# Patient Record
Sex: Female | Born: 1945 | Race: White | Hispanic: No | Smoking: Never smoker
Health system: Southern US, Community
[De-identification: ages and names within clinical notes are randomized; demographics above are authoritative.]

## PROBLEM LIST (undated history)

## (undated) DIAGNOSIS — I4819 Other persistent atrial fibrillation: Secondary | ICD-10-CM

## (undated) DIAGNOSIS — Z8781 Personal history of (healed) traumatic fracture: Secondary | ICD-10-CM

## (undated) DIAGNOSIS — G479 Sleep disorder, unspecified: Secondary | ICD-10-CM

## (undated) DIAGNOSIS — E0590 Thyrotoxicosis, unspecified without thyrotoxic crisis or storm: Secondary | ICD-10-CM

## (undated) DIAGNOSIS — R7301 Impaired fasting glucose: Secondary | ICD-10-CM

## (undated) DIAGNOSIS — I4510 Unspecified right bundle-branch block: Secondary | ICD-10-CM

## (undated) DIAGNOSIS — E042 Nontoxic multinodular goiter: Secondary | ICD-10-CM

## (undated) DIAGNOSIS — I10 Essential (primary) hypertension: Secondary | ICD-10-CM

## (undated) DIAGNOSIS — F419 Anxiety disorder, unspecified: Secondary | ICD-10-CM

## (undated) DIAGNOSIS — R7989 Other specified abnormal findings of blood chemistry: Secondary | ICD-10-CM

## (undated) DIAGNOSIS — H269 Unspecified cataract: Secondary | ICD-10-CM

## (undated) DIAGNOSIS — K5730 Diverticulosis of large intestine without perforation or abscess without bleeding: Secondary | ICD-10-CM

## (undated) DIAGNOSIS — F329 Major depressive disorder, single episode, unspecified: Secondary | ICD-10-CM

## (undated) DIAGNOSIS — S82209A Unspecified fracture of shaft of unspecified tibia, initial encounter for closed fracture: Secondary | ICD-10-CM

## (undated) DIAGNOSIS — K219 Gastro-esophageal reflux disease without esophagitis: Secondary | ICD-10-CM

## (undated) DIAGNOSIS — R3129 Other microscopic hematuria: Secondary | ICD-10-CM

## (undated) DIAGNOSIS — D689 Coagulation defect, unspecified: Secondary | ICD-10-CM

## (undated) DIAGNOSIS — H409 Unspecified glaucoma: Secondary | ICD-10-CM

## (undated) DIAGNOSIS — S83281A Other tear of lateral meniscus, current injury, right knee, initial encounter: Secondary | ICD-10-CM

## (undated) HISTORY — DX: Anxiety disorder, unspecified: F41.9

## (undated) HISTORY — DX: Coagulation defect, unspecified: D68.9

## (undated) HISTORY — DX: Unspecified right bundle-branch block: I45.10

## (undated) HISTORY — DX: Sleep disorder, unspecified: G47.9

## (undated) HISTORY — DX: Personal history of (healed) traumatic fracture: Z87.81

## (undated) HISTORY — DX: Other specified abnormal findings of blood chemistry: R79.89

## (undated) HISTORY — DX: Thyrotoxicosis, unspecified without thyrotoxic crisis or storm: E05.90

## (undated) HISTORY — DX: Other microscopic hematuria: R31.29

## (undated) HISTORY — DX: Unspecified cataract: H26.9

## (undated) HISTORY — DX: Impaired fasting glucose: R73.01

## (undated) HISTORY — DX: Nontoxic multinodular goiter: E04.2

## (undated) HISTORY — DX: Diverticulosis of large intestine without perforation or abscess without bleeding: K57.30

## (undated) HISTORY — DX: Other tear of lateral meniscus, current injury, right knee, initial encounter: S83.281A

## (undated) HISTORY — DX: Other persistent atrial fibrillation: I48.19

## (undated) HISTORY — DX: Unspecified fracture of shaft of unspecified tibia, initial encounter for closed fracture: S82.209A

## (undated) HISTORY — DX: Essential (primary) hypertension: I10

## (undated) HISTORY — DX: Major depressive disorder, single episode, unspecified: F32.9

## (undated) HISTORY — DX: Unspecified glaucoma: H40.9

## (undated) HISTORY — DX: Gastro-esophageal reflux disease without esophagitis: K21.9

---

## 1976-09-25 DIAGNOSIS — F32A Depression, unspecified: Secondary | ICD-10-CM

## 1976-09-25 HISTORY — DX: Depression, unspecified: F32.A

## 1986-09-25 HISTORY — PX: ABDOMINAL HYSTERECTOMY: SHX81

## 1986-09-25 HISTORY — PX: BREAST EXCISIONAL BIOPSY: SUR124

## 1990-09-25 HISTORY — PX: BREAST BIOPSY: SHX20

## 2000-02-24 ENCOUNTER — Encounter: Payer: Self-pay | Admitting: Internal Medicine

## 2001-05-15 ENCOUNTER — Ambulatory Visit (HOSPITAL_COMMUNITY): Admission: RE | Admit: 2001-05-15 | Discharge: 2001-05-15 | Payer: Self-pay | Admitting: Obstetrics and Gynecology

## 2001-05-15 ENCOUNTER — Encounter (INDEPENDENT_AMBULATORY_CARE_PROVIDER_SITE_OTHER): Payer: Self-pay

## 2003-03-23 ENCOUNTER — Encounter: Payer: Self-pay | Admitting: Internal Medicine

## 2004-10-06 ENCOUNTER — Ambulatory Visit: Payer: Self-pay | Admitting: Internal Medicine

## 2005-04-25 ENCOUNTER — Ambulatory Visit: Payer: Self-pay | Admitting: Internal Medicine

## 2005-04-25 DIAGNOSIS — K5730 Diverticulosis of large intestine without perforation or abscess without bleeding: Secondary | ICD-10-CM

## 2005-04-25 HISTORY — DX: Diverticulosis of large intestine without perforation or abscess without bleeding: K57.30

## 2005-05-09 ENCOUNTER — Ambulatory Visit: Payer: Self-pay | Admitting: Internal Medicine

## 2005-05-22 ENCOUNTER — Ambulatory Visit: Payer: Self-pay | Admitting: Internal Medicine

## 2005-07-12 ENCOUNTER — Ambulatory Visit: Payer: Self-pay | Admitting: Internal Medicine

## 2005-07-19 ENCOUNTER — Ambulatory Visit: Payer: Self-pay | Admitting: Internal Medicine

## 2005-11-22 ENCOUNTER — Ambulatory Visit: Payer: Self-pay | Admitting: *Deleted

## 2006-03-20 ENCOUNTER — Ambulatory Visit: Payer: Self-pay | Admitting: Urology

## 2006-03-20 ENCOUNTER — Encounter: Payer: Self-pay | Admitting: Internal Medicine

## 2006-03-22 ENCOUNTER — Ambulatory Visit: Payer: Self-pay | Admitting: Internal Medicine

## 2006-04-30 ENCOUNTER — Ambulatory Visit: Payer: Self-pay | Admitting: Internal Medicine

## 2006-07-23 ENCOUNTER — Ambulatory Visit: Payer: Self-pay | Admitting: Internal Medicine

## 2006-08-15 ENCOUNTER — Ambulatory Visit: Payer: Self-pay | Admitting: Internal Medicine

## 2006-09-04 ENCOUNTER — Ambulatory Visit: Payer: Self-pay | Admitting: *Deleted

## 2006-11-05 ENCOUNTER — Ambulatory Visit: Payer: Self-pay | Admitting: Internal Medicine

## 2006-12-19 ENCOUNTER — Encounter: Payer: Self-pay | Admitting: Internal Medicine

## 2006-12-19 DIAGNOSIS — I10 Essential (primary) hypertension: Secondary | ICD-10-CM | POA: Insufficient documentation

## 2006-12-19 DIAGNOSIS — G479 Sleep disorder, unspecified: Secondary | ICD-10-CM | POA: Insufficient documentation

## 2006-12-19 DIAGNOSIS — K219 Gastro-esophageal reflux disease without esophagitis: Secondary | ICD-10-CM | POA: Insufficient documentation

## 2006-12-19 DIAGNOSIS — K7689 Other specified diseases of liver: Secondary | ICD-10-CM

## 2006-12-19 DIAGNOSIS — J309 Allergic rhinitis, unspecified: Secondary | ICD-10-CM | POA: Insufficient documentation

## 2006-12-19 DIAGNOSIS — I4510 Unspecified right bundle-branch block: Secondary | ICD-10-CM | POA: Insufficient documentation

## 2006-12-19 DIAGNOSIS — F39 Unspecified mood [affective] disorder: Secondary | ICD-10-CM | POA: Insufficient documentation

## 2006-12-19 DIAGNOSIS — K5730 Diverticulosis of large intestine without perforation or abscess without bleeding: Secondary | ICD-10-CM | POA: Insufficient documentation

## 2007-05-23 ENCOUNTER — Encounter (INDEPENDENT_AMBULATORY_CARE_PROVIDER_SITE_OTHER): Payer: Self-pay | Admitting: *Deleted

## 2007-06-07 ENCOUNTER — Ambulatory Visit: Payer: Self-pay | Admitting: Internal Medicine

## 2007-06-10 LAB — CONVERTED CEMR LAB
ALT: 22 units/L (ref 0–35)
Albumin: 3.9 g/dL (ref 3.5–5.2)
Alkaline Phosphatase: 63 units/L (ref 39–117)
CO2: 31 meq/L (ref 19–32)
Calcium: 9.5 mg/dL (ref 8.4–10.5)
Chloride: 104 meq/L (ref 96–112)
GFR calc Af Amer: 93 mL/min
Glucose, Bld: 111 mg/dL — ABNORMAL HIGH (ref 70–99)
Potassium: 4.2 meq/L (ref 3.5–5.1)
Sodium: 141 meq/L (ref 135–145)
Total Bilirubin: 0.6 mg/dL (ref 0.3–1.2)
Total Protein: 7.6 g/dL (ref 6.0–8.3)

## 2007-06-19 ENCOUNTER — Ambulatory Visit: Payer: Self-pay | Admitting: Internal Medicine

## 2007-06-20 LAB — CONVERTED CEMR LAB
Free T4: 0.7 ng/dL (ref 0.6–1.6)
TSH: 0.12 microintl units/mL — ABNORMAL LOW (ref 0.35–5.50)

## 2007-08-01 ENCOUNTER — Ambulatory Visit: Payer: Self-pay | Admitting: Internal Medicine

## 2007-08-02 ENCOUNTER — Encounter: Payer: Self-pay | Admitting: Internal Medicine

## 2007-08-02 ENCOUNTER — Encounter (INDEPENDENT_AMBULATORY_CARE_PROVIDER_SITE_OTHER): Payer: Self-pay | Admitting: *Deleted

## 2007-09-25 ENCOUNTER — Encounter: Payer: Self-pay | Admitting: Internal Medicine

## 2007-11-13 ENCOUNTER — Telehealth (INDEPENDENT_AMBULATORY_CARE_PROVIDER_SITE_OTHER): Payer: Self-pay | Admitting: *Deleted

## 2007-12-09 ENCOUNTER — Ambulatory Visit: Payer: Self-pay | Admitting: Internal Medicine

## 2008-03-02 ENCOUNTER — Telehealth (INDEPENDENT_AMBULATORY_CARE_PROVIDER_SITE_OTHER): Payer: Self-pay | Admitting: *Deleted

## 2008-04-01 ENCOUNTER — Encounter: Payer: Self-pay | Admitting: Internal Medicine

## 2008-04-06 ENCOUNTER — Ambulatory Visit: Payer: Self-pay | Admitting: Internal Medicine

## 2008-04-07 ENCOUNTER — Telehealth (INDEPENDENT_AMBULATORY_CARE_PROVIDER_SITE_OTHER): Payer: Self-pay | Admitting: *Deleted

## 2008-04-09 ENCOUNTER — Encounter: Payer: Self-pay | Admitting: Internal Medicine

## 2008-05-04 ENCOUNTER — Ambulatory Visit: Payer: Self-pay | Admitting: Internal Medicine

## 2008-05-13 ENCOUNTER — Ambulatory Visit: Payer: Self-pay | Admitting: Internal Medicine

## 2008-05-15 LAB — CONVERTED CEMR LAB
AST: 24 units/L (ref 0–37)
Albumin: 3.5 g/dL (ref 3.5–5.2)
Alkaline Phosphatase: 66 units/L (ref 39–117)
Basophils Absolute: 0.1 10*3/uL (ref 0.0–0.1)
Bilirubin, Direct: 0.1 mg/dL (ref 0.0–0.3)
Chloride: 107 meq/L (ref 96–112)
Eosinophils Absolute: 0.2 10*3/uL (ref 0.0–0.7)
GFR calc Af Amer: 109 mL/min
GFR calc non Af Amer: 90 mL/min
HCT: 37.8 % (ref 36.0–46.0)
HDL: 51.9 mg/dL (ref >39.0)
MCV: 94.3 fL (ref 78.0–100.0)
Monocytes Absolute: 0.4 10*3/uL (ref 0.1–1.0)
Neutrophils Relative %: 46.9 % (ref 43.0–77.0)
Platelets: 313 10*3/uL (ref 150–400)
Potassium: 4.2 meq/L (ref 3.5–5.1)
RDW: 13.2 % (ref 11.5–14.6)
Sodium: 142 meq/L (ref 135–145)
TSH: 0.18 microintl units/mL — ABNORMAL LOW (ref 0.35–5.50)
Total Bilirubin: 0.5 mg/dL (ref 0.3–1.2)
Triglycerides: 46 mg/dL (ref 0–149)
VLDL: 9 mg/dL (ref 0–40)
WBC: 5.4 10*3/uL (ref 4.5–10.5)

## 2008-05-18 ENCOUNTER — Ambulatory Visit: Payer: Self-pay | Admitting: Internal Medicine

## 2008-05-20 LAB — CONVERTED CEMR LAB
Free T4: 0.7 ng/dL (ref 0.6–1.6)
TSH: 0.27 microintl units/mL — ABNORMAL LOW (ref 0.35–5.50)

## 2008-06-08 ENCOUNTER — Ambulatory Visit: Payer: Self-pay | Admitting: Internal Medicine

## 2008-06-17 ENCOUNTER — Ambulatory Visit: Payer: Self-pay | Admitting: Family Medicine

## 2008-06-19 ENCOUNTER — Telehealth: Payer: Self-pay | Admitting: Family Medicine

## 2008-07-24 ENCOUNTER — Telehealth: Payer: Self-pay | Admitting: Internal Medicine

## 2008-08-03 ENCOUNTER — Ambulatory Visit: Payer: Self-pay | Admitting: Internal Medicine

## 2008-08-03 ENCOUNTER — Encounter: Payer: Self-pay | Admitting: Internal Medicine

## 2008-08-05 ENCOUNTER — Encounter (INDEPENDENT_AMBULATORY_CARE_PROVIDER_SITE_OTHER): Payer: Self-pay | Admitting: *Deleted

## 2008-08-13 ENCOUNTER — Encounter: Payer: Self-pay | Admitting: Internal Medicine

## 2008-09-07 ENCOUNTER — Telehealth: Payer: Self-pay | Admitting: Internal Medicine

## 2008-12-10 ENCOUNTER — Ambulatory Visit: Payer: Self-pay | Admitting: Internal Medicine

## 2008-12-10 DIAGNOSIS — R7301 Impaired fasting glucose: Secondary | ICD-10-CM | POA: Insufficient documentation

## 2009-02-08 ENCOUNTER — Telehealth: Payer: Self-pay | Admitting: Internal Medicine

## 2009-02-16 ENCOUNTER — Ambulatory Visit: Payer: Self-pay | Admitting: Family Medicine

## 2009-02-17 ENCOUNTER — Ambulatory Visit: Payer: Self-pay | Admitting: Family Medicine

## 2009-03-10 ENCOUNTER — Encounter: Payer: Self-pay | Admitting: Internal Medicine

## 2009-03-10 ENCOUNTER — Ambulatory Visit: Payer: Self-pay | Admitting: Urology

## 2009-05-10 ENCOUNTER — Telehealth: Payer: Self-pay | Admitting: Internal Medicine

## 2009-05-25 ENCOUNTER — Telehealth: Payer: Self-pay | Admitting: Internal Medicine

## 2009-06-03 ENCOUNTER — Ambulatory Visit: Payer: Self-pay | Admitting: Family Medicine

## 2009-06-10 ENCOUNTER — Ambulatory Visit: Payer: Self-pay | Admitting: Family Medicine

## 2009-06-15 ENCOUNTER — Ambulatory Visit: Payer: Self-pay | Admitting: Internal Medicine

## 2009-06-17 LAB — CONVERTED CEMR LAB
ALT: 37 units/L — ABNORMAL HIGH (ref 0–35)
AST: 33 units/L (ref 0–37)
BUN: 25 mg/dL — ABNORMAL HIGH (ref 6–23)
Basophils Absolute: 0.1 10*3/uL (ref 0.0–0.1)
Basophils Relative: 1.1 % (ref 0.0–3.0)
CO2: 30 meq/L (ref 19–32)
Cholesterol: 208 mg/dL — ABNORMAL HIGH (ref 0–200)
Creatinine, Ser: 0.8 mg/dL (ref 0.4–1.2)
Free T4: 0.7 ng/dL (ref 0.6–1.6)
Glucose, Bld: 112 mg/dL — ABNORMAL HIGH (ref 70–99)
Lymphs Abs: 2.2 10*3/uL (ref 0.7–4.0)
MCV: 91.5 fL (ref 78.0–100.0)
Monocytes Relative: 8.9 % (ref 3.0–12.0)
Neutro Abs: 3.6 10*3/uL (ref 1.4–7.7)
TSH: 0.55 microintl units/mL (ref 0.35–5.50)
Total CHOL/HDL Ratio: 4
Total Protein: 8.1 g/dL (ref 6.0–8.3)
Triglycerides: 86 mg/dL (ref 0.0–149.0)
WBC: 6.7 10*3/uL (ref 4.5–10.5)

## 2009-07-15 ENCOUNTER — Encounter: Payer: Self-pay | Admitting: Internal Medicine

## 2009-08-05 ENCOUNTER — Encounter: Payer: Self-pay | Admitting: Internal Medicine

## 2009-08-05 ENCOUNTER — Ambulatory Visit: Payer: Self-pay | Admitting: Internal Medicine

## 2009-08-10 ENCOUNTER — Encounter: Payer: Self-pay | Admitting: Internal Medicine

## 2009-10-18 ENCOUNTER — Telehealth: Payer: Self-pay | Admitting: Internal Medicine

## 2009-10-19 ENCOUNTER — Telehealth: Payer: Self-pay | Admitting: Internal Medicine

## 2009-12-13 ENCOUNTER — Ambulatory Visit: Payer: Self-pay | Admitting: Internal Medicine

## 2010-03-03 ENCOUNTER — Telehealth: Payer: Self-pay | Admitting: Internal Medicine

## 2010-05-26 DIAGNOSIS — S82209A Unspecified fracture of shaft of unspecified tibia, initial encounter for closed fracture: Secondary | ICD-10-CM

## 2010-05-26 HISTORY — DX: Unspecified fracture of shaft of unspecified tibia, initial encounter for closed fracture: S82.209A

## 2010-05-31 ENCOUNTER — Encounter: Admission: RE | Admit: 2010-05-31 | Discharge: 2010-05-31 | Payer: Self-pay | Admitting: Internal Medicine

## 2010-06-27 ENCOUNTER — Telehealth: Payer: Self-pay | Admitting: Internal Medicine

## 2010-07-21 ENCOUNTER — Encounter: Payer: Self-pay | Admitting: Internal Medicine

## 2010-08-08 ENCOUNTER — Ambulatory Visit: Payer: Self-pay | Admitting: Internal Medicine

## 2010-08-11 LAB — CONVERTED CEMR LAB
Albumin: 4.3 g/dL (ref 3.5–5.2)
Alkaline Phosphatase: 65 units/L (ref 39–117)
BUN: 21 mg/dL (ref 6–23)
Basophils Absolute: 0 10*3/uL (ref 0.0–0.1)
Basophils Relative: 0.6 % (ref 0.0–3.0)
CO2: 28 meq/L (ref 19–32)
Calcium: 10.4 mg/dL (ref 8.4–10.5)
Chloride: 101 meq/L (ref 96–112)
Eosinophils Absolute: 0.1 10*3/uL (ref 0.0–0.7)
Free T4: 0.93 ng/dL (ref 0.60–1.60)
HCT: 39.9 % (ref 36.0–46.0)
Hemoglobin: 13.5 g/dL (ref 12.0–15.0)
Lymphs Abs: 2.1 10*3/uL (ref 0.7–4.0)
MCHC: 33.9 g/dL (ref 30.0–36.0)
MCV: 89.4 fL (ref 78.0–100.0)
Neutro Abs: 3.8 10*3/uL (ref 1.4–7.7)
Potassium: 4.7 meq/L (ref 3.5–5.1)
RBC: 4.46 M/uL (ref 3.87–5.11)
RDW: 15 % — ABNORMAL HIGH (ref 11.5–14.6)
TSH: 0.07 microintl units/mL — ABNORMAL LOW (ref 0.35–5.50)
Total Protein: 7.7 g/dL (ref 6.0–8.3)
Vitamin B-12: 846 pg/mL (ref 211–911)

## 2010-08-16 ENCOUNTER — Encounter: Payer: Self-pay | Admitting: Internal Medicine

## 2010-08-16 ENCOUNTER — Telehealth: Payer: Self-pay | Admitting: Internal Medicine

## 2010-08-16 ENCOUNTER — Ambulatory Visit: Payer: Self-pay | Admitting: Internal Medicine

## 2010-08-19 LAB — HM MAMMOGRAPHY: HM Mammogram: NORMAL

## 2010-08-22 ENCOUNTER — Encounter: Payer: Self-pay | Admitting: Internal Medicine

## 2010-10-27 NOTE — Letter (Signed)
Summary: Results Follow up Letter  [place] at [facility]  [address] [place], [place] [address]   Phone: [phone]  Fax: [phone]    08/22/2010 MRN: [id number]  [facility] [address] [place], [place]  [address]  Dear Ms. Gotts,  The following are the results of your recent test(s):  Test         Result    Pap Smear:        Normal _____  Not Normal _____ Comments: ______________________________________________________ Cholesterol: LDL(Bad cholesterol):         Your goal is less than:         HDL (Good cholesterol):       Your goal is more than: Comments:  ______________________________________________________ Mammogram:        Normal __X___  Not Normal _____ Comments:mammo looks fine, Repeat recommended in 1-2 years  ___________________________________________________________________ Hemoccult:        Normal _____  Not normal _______ Comments:    _____________________________________________________________________ Other Tests:    We routinely do not discuss normal results over the telephone.  If you desire a copy of the results, or you have any questions about this information we can discuss them at your next office visit.   Sincerely,      Tillman Abide, MD

## 2010-10-27 NOTE — Letter (Signed)
Summary: [facility]  [facility] Care-Nephrology   Imported By: Maryln Gottron 08/01/2010 14:20:25  _____________________________________________________________________  External Attachment:    Type:   Image     Comment:   External Document  Appended Document: [facility] Care-Nephrology no changes 1 year follow up as long as creatinine is stable Needs copy of labs (Dr Ellis Savage)

## 2010-10-27 NOTE — Progress Notes (Signed)
Summary: Question about labs  Phone Note Call from Patient Call back at Home Phone [phone]   Caller: Patient Call For: Alexandria Salt MD Summary of Call: Patient received copy of labs in the mail and wanted to know why Dr.Letvak didn't check her cholesterol? I asked pt was she on meds she said no, but pt still wanted to know why it wasn't checked. Please advise, pt is aware you are out of the office. Initial call taken by: Mervin Hack CMA Duncan Dull),  August 16, 2010 2:41 PM  Follow-up for Phone Call        Please let her know that we checked her cholesterol last year and it was pretty good with a total of 208. It is not necessary or appropriate to keep checkiing it. The [employer] Program recommends every 5 years for those who are not sig elevated or on treatment Follow-up by: Alexandria Salt MD,  August 17, 2010 8:36 AM  Additional Follow-up for Phone Call Additional follow up Details #1::        no answer at home number, no answering machine, will try again later. DeShannon Smith CMA Duncan Dull)  August 17, 2010 10:09 AM   no answer at home number, no answering machine, will try again later. DeShannon Smith CMA Duncan Dull)  August 17, 2010 12:27 PM   Spoke with patient and advised results. Pt understood Additional Follow-up by: Mervin Hack CMA Duncan Dull),  August 22, 2010 8:56 AM

## 2010-10-27 NOTE — Progress Notes (Signed)
Summary: Needs Prilosec name brand only  Phone Note Refill Request Call back at [phone] Message from:  Patient on October 19, 2009 3:11 PM  Refills Requested: Medication #1:  PRILOSEC OTC 20 MG TBEC Take 1 tablet by mouth twice a day Pt said she needs the name brand Prilosec. Generic Prilosec does not work for her.[facility] is going to fax a form to Dr. Alphonsus Sias to dispense name brand only for Prilosec. Pt has enough med to last for 3 days.  Pt knows Dr. Alphonsus Sias will be back in office on 10/20/09 in AM. Please advise.   Initial call taken by: Lewanda Rife LPN,  October 19, 2009 3:18 PM  Follow-up for Phone Call        Rx completed in Dr. Tiajuana Amass Follow-up by: Cindee Salt MD,  October 20, 2009 7:42 AM    New/Updated Medications: PRILOSEC 20 MG CPDR (OMEPRAZOLE) 1 daily for acid reflux [BMN] Prescriptions: PRILOSEC 20 MG CPDR (OMEPRAZOLE) 1 daily for acid reflux Brand medically necessary #30 x 12   Entered and Authorized by:   Cindee Salt MD   Signed by:   Cindee Salt MD on 10/20/2009   Method used:   Electronically to        [facility] (retail)       [address]       [place], [place]  [id number]       Ph: [phone]       Fax: [phone]   RxID:   [id number]

## 2010-10-27 NOTE — Assessment & Plan Note (Signed)
Summary: 6 MONTH FOLLOW UP/[facility]   Vital Signs:  Patient profile:   65 year old female Weight:      198 pounds BMI:     33.07 Temp:     99.00 degrees F oral Pulse rate:   106 / minute Pulse rhythm:   regular BP sitting:   110 / 80  (left arm) Cuff size:   large  Vitals Entered By: Mervin Hack CMA Duncan Dull) (August 08, 2010 10:44 AM) CC: 6 month follow-up   History of Present Illness: DOing okay reviewed recent nephrology visit--due for labs  Notes ongoing issue with energy levels May be related to hot weather Various complaints--memory issues, has some numbness --but seems to be in ulnar distribution. SOme tremor in those fingers and thumb as well Notes decreased strength in hands as well worried about spinal stenosis due to sister's problems with this  Fractured left tibia and injured knee due to twisting fall still has immobilizer on now--DR Charlann Boxer treating has been out of work for 2 months  Feels very depressed has to use walker and can't do much very preoccupied with leg but has trouble with word recall No apraxia or change in executive function  No chest pain No SOB Limited exercise now due to fractured tibia  Allergies: 1)  Phenergan (Promethazine Hcl) 2)  Keflex (Cephalexin) 3)  Codeine Phosphate (Codeine Phosphate) 4)  Augmentin  Past History:  Past medical, surgical, family and social histories (including risk factors) reviewed for relevance to current acute and chronic problems.  Past Medical History: Reviewed history from 06/15/2009 and no changes required. Depression 1978 Diverticulosis, colon 8/06 GERD Sleep distrurbance Glucose intolerance Hypertension Chronic low TSH but euthyroid Microscopic hematuria------------------------------------------Drs  Kiser/Cope  Past Surgical History: Hysterectomy/BSO (Endometriosis)  1988 Lumpectomy right breast 1988 Fx left tibia 9/11  Family History: Reviewed history from 04/06/2008 and no changes  required. Dad died young of accidental death Mom died of lung cancer, DM 2 sisters--1 has oral cancer CAD in family Depression in both sisters and mom Some dementia  Social History: Reviewed history from 06/07/2007 and no changes required. 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   Neck:  supple, no masses, no thyromegaly, no carotid bruits, and no cervical lymphadenopathy.   Lungs:  normal respiratory effort, no intercostal retractions, no accessory muscle use, and normal breath sounds.   Heart:  normal rate, regular rhythm, no murmur, and no gallop.   Abdomen:  soft and non-tender.   Neurologic:  alert & oriented X3 and strength normal in all extremities.  Walks with rolling walker Psych:  normally interactive, good eye contact, not anxious appearing, and dysphoric affect.     Impression & Recommendations:  Problem # 1:  FATIGUE (ICD-780.79) Assessment New  seems related to disability and worsening of her depression some memory loss that doesn't seem like dementia will check labs including B12  Orders: TLB-Renal Function Panel (80069-RENAL) TLB-CBC Platelet - w/Differential (85025-CBCD) TLB-Hepatic/Liver Function Pnl (80076-HEPATIC) TLB-TSH (Thyroid Stimulating Hormone) (84443-TSH) Venipuncture (46962) TLB-B12, Serum-Total ONLY (95284-X32)  Problem # 2:  DEPRESSION (ICD-311) Assessment: Deteriorated mild exacerbation with broken leg and being out of work hopes to go back to work in 4 weeks  Her updated medication list for this problem includes:    Lexapro 20 Mg Tabs (Escitalopram oxalate) .Marland Kitchen... Take 1 tablet daily    Clorazepate Dipotassium 7.5 Mg Tabs (Clorazepate  dipotassium) .Marland Kitchen... 1/2-1 three times a day as needed for nerves and sleep problems  Problem # 3:  HYPERTENSION (ICD-401.9) Assessment: Unchanged BP is fine due for labs  Her updated medication list for this problem includes:    Dyazide 37.5-25 Mg Caps (Triamterene-hctz) .Marland Kitchen... Take 1 by mouth once daily  BP today: 110/80 Prior BP: 122/80 (12/13/2009)  Labs Reviewed: K+: 4.5 (06/15/2009) Creat: : 0.8 (06/15/2009)   Chol: 208 (06/15/2009)   HDL: 57.90 (06/15/2009)   LDL: 114 (05/13/2008)   TG: 86.0 (06/15/2009)  Problem # 4:  GERD (ICD-530.81) Assessment: Unchanged okay on the meds  Her updated medication list for this problem includes:    Prilosec 20 Mg Cpdr (Omeprazole) .Marland Kitchen... 1 tab twice a day for acid reflux  Complete Medication List: 1)  Lexapro 20 Mg Tabs (Escitalopram oxalate) .... Take 1 tablet daily 2)  Clorazepate Dipotassium 7.5 Mg Tabs (Clorazepate dipotassium) .... 1/2-1 three times a day as needed for nerves and sleep problems 3)  Dyazide 37.5-25 Mg Caps (Triamterene-hctz) .... Take 1 by mouth once daily 4)  Antivert 25 Mg Tabs (Meclizine hcl) .... Take 1 by mouth once daily 5)  Mometasone Furoate 0.1 % Crea (Mometasone furoate) .... Apply two times a day as needed for itching 6)  Vitamin D 1000 Unit Tabs (Cholecalciferol) .Marland Kitchen.. 1 tab daily 7)  Prilosec 20 Mg Cpdr (Omeprazole) .Marland Kitchen.. 1 tab twice a day for acid reflux 8)  Bayer Womens 81-300 Mg Tabs (Aspirin-calcium carbonate) .... Take one by mouth once a day 9)  Multivitamins Tabs (Multiple vitamin) .... Otc as directed.  Other Orders: TLB-A1C / Hgb A1C (Glycohemoglobin) (83036-A1C) Zoster (Shingles) Vaccine Live [phone]) Admin 1st Vaccine (41324) Flu Vaccine 60yrs + MEDICARE PATIENTS (M0102) Administration Flu vaccine - MCR (V2536) Radiology Referral (Radiology)  Patient Instructions: 1)  Please schedule a follow-up appointment in 4 months .  2)  Schedule your mammogram.  Prescriptions: MOMETASONE FUROATE 0.1 %   CREA (MOMETASONE FUROATE) Apply two times a day as needed for itching  #45gm x 1   Entered by:   Mervin Hack CMA (AAMA)   Authorized by:   Cindee Salt MD   Signed by:   Mervin Hack CMA (AAMA) on 08/08/2010   Method used:   Electronically to        [facility] (retail)       [address]       [place], [place]  [id number]       Ph: [phone]       Fax: [phone]   RxID:   [id number] ANTIVERT 25 MG  TABS (MECLIZINE HCL) take 1 by mouth once daily  #90 x 3   Entered by:   Mervin Hack CMA (AAMA)   Authorized by:   Cindee Salt MD   Signed by:   Mervin Hack CMA (AAMA) on 08/08/2010   Method used:   Electronically to        [facility] (retail)       [address]       [place], [place]  [address]       Ph: [phone]       Fax: [phone]   RxID:  [id number]    Orders Added: 1)  TLB-Renal Function Panel [80069-RENAL] 2)  TLB-CBC Platelet - w/Differential [85025-CBCD] 3)  TLB-Hepatic/Liver Function Pnl [80076-HEPATIC] 4)  TLB-TSH (Thyroid Stimulating Hormone) [84443-TSH] 5)  Venipuncture [36415] 6)  TLB-B12, Serum-Total ONLY [82607-B12] 7)  Est. Patient Level IV [86578] 8)  TLB-A1C / Hgb A1C (Glycohemoglobin) [83036-A1C] 9)  Zoster (Shingles) Vaccine Live [90736] 10)  Admin 1st Vaccine [90471] 11)  Flu Vaccine 80yrs + MEDICARE PATIENTS [Q2039] 12)  Administration Flu vaccine - MCR [G0008] 13)  Radiology Referral [Radiology]   Immunizations Administered:  Zostavax # 1:    Vaccine Type: Zostavax    Site: left deltoid    Mfr: Merck    Dose: 0.79ml    Route: [place]    Given by: Mervin Hack CMA (AAMA)    Exp. Date: 05/13/2011    Lot #: [id number]    VIS given: 07/07/05 given August 08, 2010.   Immunizations Administered:  Zostavax # 1:    Vaccine Type: Zostavax    Site: left deltoid    Mfr: Merck    Dose: 0.20ml    Route: [place]    Given by:  Mervin Hack CMA (AAMA)    Exp. Date: 05/13/2011    Lot #: [id number]    VIS given: 07/07/05 given August 08, 2010.  Current Allergies (reviewed today): PHENERGAN (PROMETHAZINE HCL) KEFLEX (CEPHALEXIN) CODEINE PHOSPHATE (CODEINE PHOSPHATE) AUGMENTIN Flu Vaccine Consent Questions     Do you have a history of severe allergic reactions to this vaccine? no    Any prior history of allergic reactions to egg and/or gelatin? no    Do you have a sensitivity to the preservative Thimersol? no    Do you have a past history of Guillan-Barre Syndrome? no    Do you currently have an acute febrile illness? no    Have you ever had a severe reaction to latex? no    Vaccine information given and explained to patient? yes    Are you currently pregnant? no    Lot Number:[id number]   Exp Date:03/25/2011   Site Given  Left Deltoid IM    .[email or web address]

## 2010-10-27 NOTE — Progress Notes (Signed)
Summary: refill request for clorazepate  Phone Note Refill Request Message from:  Fax from Pharmacy  Refills Requested: Medication #1:  CLORAZEPATE DIPOTASSIUM 7.5 MG TABS 1/2-1 three times a day as needed for nerves and sleep problems   Last Refilled: 12/27/2009 Faxed request from [facility] is on your desk.  Initial call taken by: Lowella Petties CMA,  March 03, 2010 10:33 AM  Follow-up for Phone Call        Okay #90 x 1 Follow-up by: Cindee Salt MD,  March 03, 2010 12:54 PM  Additional Follow-up for Phone Call Additional follow up Details #1::        Rx faxed to pharmacy Additional Follow-up by: DeShannon Smith CMA Duncan Dull),  March 03, 2010 1:42 PM    Prescriptions: CLORAZEPATE DIPOTASSIUM 7.5 MG TABS (CLORAZEPATE DIPOTASSIUM) 1/2-1 three times a day as needed for nerves and sleep problems  #90 x 1   Entered by:   Mervin Hack CMA (AAMA)   Authorized by:   Cindee Salt MD   Signed by:   Mervin Hack CMA (AAMA) on 03/03/2010   Method used:   Handwritten   RxID:   [id number]

## 2010-10-27 NOTE — Progress Notes (Signed)
Summary: clorazepate-dipotass 7.5mg   Phone Note Refill Request Message from:  Fax from Pharmacy on October 18, 2009 2:21 PM  Refills Requested: Medication #1:  CLORAZEPATE DIPOTASSIUM 7.5 MG TABS 1/2-1 three times a day as needed for nerves and sleep problems   Supply Requested: 1 month   Last Refilled: 08/09/2009 [facility] [phone] or fax [phone]   Method Requested: Fax to Local Pharmacy Initial call taken by: Benny Lennert CMA Duncan Dull),  October 18, 2009 2:22 PM  Follow-up for Phone Call        okay #90 x 1 Follow-up by: Cindee Salt MD,  October 18, 2009 3:53 PM  Additional Follow-up for Phone Call Additional follow up Details #1::        Rx called to pharmacy Additional Follow-up by: DeShannon Katrinka Blazing CMA Duncan Dull),  October 18, 2009 3:57 PM    Prescriptions: CLORAZEPATE DIPOTASSIUM 7.5 MG TABS (CLORAZEPATE DIPOTASSIUM) 1/2-1 three times a day as needed for nerves and sleep problems  #90 x 1   Entered by:   Mervin Hack CMA (AAMA)   Authorized by:   Cindee Salt MD   Signed by:   Mervin Hack CMA (AAMA) on 10/18/2009   Method used:   Telephoned to ...       [facility] (retail)       [address]       [place], [place]  [address]       Ph: [phone]       Fax: [phone]   RxID:   [id number]

## 2010-10-27 NOTE — Assessment & Plan Note (Signed)
Summary: 5 M F/U DLO R/S FROM 12/16/09   Vital Signs:  Patient profile:   65 year old female Weight:      215 pounds Temp:     98.3 degrees F oral Pulse rate:   80 / minute Pulse rhythm:   regular BP sitting:   122 / 80  (left arm) Cuff size:   large  Vitals Entered By: Mervin Hack CMA Duncan Dull) (December 13, 2009 3:35 PM) CC: 6 month follow-up   History of Present Illness: Doing well "I have gotten lazy"---plans to work on this strength and energy down but feels it will improve   Has had some allergy symptoms using OTC eye drops benedryl seems to help more than loratadine  Prilosec is two times a day  controls her heartburn  Mood is good no problems on the lexapro  No chest pain No SOB  Allergies: 1)  Phenergan (Promethazine Hcl) 2)  Keflex (Cephalexin) 3)  Codeine Phosphate (Codeine Phosphate) 4)  Augmentin  Past History:  Past medical, surgical, family and social histories (including risk factors) reviewed for relevance to current acute and chronic problems.  Past Medical History: Reviewed history from 06/15/2009 and no changes required. Depression 1978 Diverticulosis, colon 8/06 GERD Sleep distrurbance Glucose intolerance Hypertension Chronic low TSH but euthyroid Microscopic hematuria------------------------------------------Drs  Kiser/Cope  Past Surgical History: Reviewed history from 12/09/2007 and no changes required. Hysterectomy/BSO (Endometriosis)  1988 Lumpectomy right breast 1988  Family History: Reviewed history from 04/06/2008 and no changes required. Dad died young of accidental death Mom died of lung cancer, DM 2 sisters--1 has oral cancer CAD in family Depression in both sisters and mom Some dementia  Social History: Reviewed history from 06/07/2007 and no changes required. 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 Not sure if she will be continuing next year  Physical Exam  General:  alert and normal appearance.   Neck:  supple, no masses, no thyromegaly, no carotid bruits, and no cervical lymphadenopathy.   Lungs:  normal respiratory effort and normal breath sounds.   Heart:  normal rate, regular rhythm, no murmur, and no gallop.   Abdomen:  soft and non-tender.   Msk:  no joint tenderness and no joint swelling.   Extremities:  no edema Psych:  normally interactive, good eye contact, not anxious appearing, and not depressed appearing.     Impression & Recommendations:  Problem # 1:  GERD (ICD-530.81) Assessment Unchanged doing well on two times a day prilosec need brand med  Her updated medication list for this problem includes:    Prilosec 20 Mg Cpdr (Omeprazole) .Marland Kitchen... 1 tab twice a day for acid reflux  Problem # 2:  DEPRESSION (ICD-311) Assessment: Unchanged mood is good on meds no wean planned due to chronicity  Her updated medication list for this problem includes:    Clorazepate Dipotassium 7.5 Mg Tabs (Clorazepate dipotassium) .Marland Kitchen... 1/2-1 three times a day as needed for nerves and sleep problems    Lexapro 20 Mg Tabs (Escitalopram oxalate) .Marland Kitchen... Take 1 tablet daily  Problem # 3:  HYPERTENSION, BORDERLINE (ICD-401.9) Assessment: Unchanged uses the med for occ edema at this point  Her updated medication list for this problem includes:    Dyazide 37.5-25 Mg  Caps (Triamterene-hctz) .Marland Kitchen... As needed  BP today: 122/80 Prior BP: 120/78 (06/15/2009)  Labs Reviewed: K+: 4.5 (06/15/2009) Creat: : 0.8 (06/15/2009)   Chol: 208 (06/15/2009)   HDL: 57.90 (06/15/2009)   LDL: 114 (05/13/2008)   TG: 86.0 (06/15/2009)  Problem # 4:  ALLERGIC RHINITIS (ICD-477.9) Assessment: Comment Only using OTC med for eyes now  also  Her updated medication list for this problem includes:    Loratadine 10 Mg Tabs (Loratadine) .Marland Kitchen... 1-2 tabs daily as needed for allergy symptoms  Complete Medication List: 1)  Clorazepate Dipotassium 7.5 Mg Tabs (Clorazepate dipotassium) .... 1/2-1 three times a day as needed for nerves and sleep problems 2)  Bayer Womens 81-300 Mg Tabs (Aspirin-calcium carbonate) .... Take one by mouth once a day 3)  Dyazide 37.5-25 Mg Caps (Triamterene-hctz) .... As needed 4)  Antivert 25 Mg Tabs (Meclizine hcl) .... As needed 5)  Mometasone Furoate 0.1 % Crea (Mometasone furoate) .... Apply two times a day as needed for itching 6)  Lexapro 20 Mg Tabs (Escitalopram oxalate) .... Take 1 tablet daily 7)  Multivitamins Tabs (Multiple vitamin) .... Otc as directed. 8)  Loratadine 10 Mg Tabs (Loratadine) .Marland Kitchen.. 1-2 tabs daily as needed for allergy symptoms 9)  Vitamin D 1000 Unit Tabs (Cholecalciferol) .Marland Kitchen.. 1 tab daily 10)  Prilosec 20 Mg Cpdr (Omeprazole) .Marland Kitchen.. 1 tab twice a day for acid reflux  Other Orders: Pneumococcal Vaccine (81191) Admin 1st Vaccine (47829) Admin 1st Vaccine [facility]) [phone])  Patient Instructions: 1)  Please schedule a follow-up appointment in 6 months .   Current Allergies (reviewed today): PHENERGAN (PROMETHAZINE HCL) KEFLEX (CEPHALEXIN) CODEINE PHOSPHATE (CODEINE PHOSPHATE) AUGMENTIN   Pneumovax Vaccine    Vaccine Type: Pneumovax    Site: right deltoid    Mfr: Merck    Dose: 0.5 ml    Route: IM    Given by: Mervin Hack CMA (AAMA)    Exp. Date: 05/09/2011    Lot #: [id number]    VIS given: 04/22/96 version given December 13, 2009.

## 2010-10-27 NOTE — Progress Notes (Signed)
Summary: CLORAZEPATE DIPOTASSIUM 7.5 MG TABS  Phone Note Refill Request Message from:  Fax from Pharmacy on June 27, 2010 10:28 AM  Refills Requested: Medication #1:  CLORAZEPATE DIPOTASSIUM 7.5 MG TABS 1/2-1 three times a day as needed for nerves and sleep problems   Last Refilled: 04/04/2010 Refill request from [facility]. [phone]. Form is on your desk.   Initial call taken by: Melody Comas,  June 27, 2010 10:33 AM  Follow-up for Phone Call        okay #90 x 1 Follow-up by: Cindee Salt MD,  June 27, 2010 1:31 PM  Additional Follow-up for Phone Call Additional follow up Details #1::        Rx faxed to pharmacy Additional Follow-up by: DeShannon Smith CMA Duncan Dull),  June 27, 2010 2:25 PM    Prescriptions: CLORAZEPATE DIPOTASSIUM 7.5 MG TABS (CLORAZEPATE DIPOTASSIUM) 1/2-1 three times a day as needed for nerves and sleep problems  #90 x 1   Entered by:   Mervin Hack CMA (AAMA)   Authorized by:   Cindee Salt MD   Signed by:   Mervin Hack CMA (AAMA) on 06/27/2010   Method used:   Handwritten   RxID:   [id number]

## 2010-11-14 ENCOUNTER — Telehealth: Payer: Self-pay | Admitting: Internal Medicine

## 2010-11-22 NOTE — Progress Notes (Signed)
Summary: CLORAZEPATE DIPOTASSIUM  Phone Note Refill Request Message from:  [facility] [phone] on November 14, 2010 12:46 PM  Refills Requested: Medication #1:  CLORAZEPATE DIPOTASSIUM 7.5 MG TABS 1/2-1 three times a day as needed for nerves and sleep problems   Last Refilled: 09/01/2010 Form on your desk    Method Requested: Fax to Local Pharmacy Initial call taken by: DeShannon Smith CMA Duncan Dull),  November 14, 2010 12:46 PM  Follow-up for Phone Call        okay #90 x 0 Follow-up by: Cindee Salt MD,  November 14, 2010 1:33 PM  Additional Follow-up for Phone Call Additional follow up Details #1::        Rx faxed to pharmacy Additional Follow-up by: DeShannon Katrinka Blazing CMA Duncan Dull),  November 14, 2010 2:24 PM    Prescriptions: CLORAZEPATE DIPOTASSIUM 7.5 MG TABS (CLORAZEPATE DIPOTASSIUM) 1/2-1 three times a day as needed for nerves and sleep problems  #90 x 0   Entered by:   Mervin Hack CMA (AAMA)   Authorized by:   Cindee Salt MD   Signed by:   Mervin Hack CMA (AAMA) on 11/14/2010   Method used:   Handwritten   RxID:   [id number]

## 2010-11-24 ENCOUNTER — Ambulatory Visit (INDEPENDENT_AMBULATORY_CARE_PROVIDER_SITE_OTHER): Payer: Medicare Other | Admitting: Internal Medicine

## 2010-11-24 ENCOUNTER — Encounter: Payer: Self-pay | Admitting: Internal Medicine

## 2010-11-24 DIAGNOSIS — F329 Major depressive disorder, single episode, unspecified: Secondary | ICD-10-CM

## 2010-12-01 NOTE — Assessment & Plan Note (Signed)
Summary: 4 MTH F/U DLO   Vital Signs:  Patient profile:   65 year old female Weight:      209 pounds Temp:     98.5 degrees F oral Pulse rate:   87 / minute Pulse rhythm:   regular BP sitting:   124 / 90  (left arm) Cuff size:   regular  Vitals Entered By: Mervin Hack CMA Duncan Dull) (November 24, 2010 3:46 PM) CC: follow-up   History of Present Illness: Notes that her fatigue is somewhat better has gone back to work now The heat had really affected her  Left knee is still not well wearing the brace regularly--this "wears me out"  Depression is better Outlook is better Concerned about possible surgery for knee still  Allergies: 1)  Phenergan (Promethazine Hcl) 2)  Keflex (Cephalexin) 3)  Codeine Phosphate (Codeine Phosphate) 4)  Augmentin  Past History:  Past Medical History: Depression 1978 Diverticulosis, colon 8/06 GERD Sleep distrurbance Impaired fasting glucose Hypertension Chronic low TSH but euthyroid Microscopic hematuria------------------------------------------Drs  Kiser/Cope  Review of Systems       worries about her age and weight weight is up 11# In bed by 8 or after----yp at 4AM though. Uses tranxene regularly   Physical Exam  General:  alert and normal appearance.   Psych:  normally interactive, good eye contact, not anxious appearing, and not depressed appearing.     Impression & Recommendations:  Problem # 1:  DEPRESSION (ICD-311) Assessment Improved Mood is better  back at work Bothered by uncertainty about knee still discussed pool exercises---may give her some greater sense of wellness  Her updated medication list for this problem includes:    Lexapro 20 Mg Tabs (Escitalopram oxalate) .Marland Kitchen... Take 1 tablet daily    Clorazepate Dipotassium 7.5 Mg Tabs (Clorazepate dipotassium) .Marland Kitchen... 1/2-1 three times a day as needed for nerves and sleep problems  Complete Medication List: 1)  Lexapro 20 Mg Tabs (Escitalopram oxalate) .... Take  1 tablet daily 2)  Clorazepate Dipotassium 7.5 Mg Tabs (Clorazepate dipotassium) .... 1/2-1 three times a day as needed for nerves and sleep problems 3)  Dyazide 37.5-25 Mg Caps (Triamterene-hctz) .... Take 1 by mouth once daily 4)  Antivert 25 Mg Tabs (Meclizine hcl) .... Take 1 by mouth once daily 5)  Mometasone Furoate 0.1 % Crea (Mometasone furoate) .... Apply two times a day as needed for itching 6)  Vitamin D 1000 Unit Tabs (Cholecalciferol) .Marland Kitchen.. 1 tab daily 7)  Prilosec 20 Mg Cpdr (Omeprazole) .Marland Kitchen.. 1 tab twice a day for acid reflux 8)  Bayer Womens 81-300 Mg Tabs (Aspirin-calcium carbonate) .... Take one by mouth once a day 9)  Multivitamins Tabs (Multiple vitamin) .... Otc as directed. 10)  Calcium 500 Mg Tabs (Calcium) .... Take 1 by mouth two times a day  Patient Instructions: 1)  Please schedule a follow-up appointment in 5 months for Medicare Wellness visit   Orders Added: 1)  Est. Patient Level III [95621]    Current Allergies (reviewed today): PHENERGAN (PROMETHAZINE HCL) KEFLEX (CEPHALEXIN) CODEINE PHOSPHATE (CODEINE PHOSPHATE) AUGMENTIN

## 2011-02-03 ENCOUNTER — Other Ambulatory Visit: Payer: Self-pay | Admitting: *Deleted

## 2011-02-03 MED ORDER — CLORAZEPATE DIPOTASSIUM 7.5 MG PO TABS: ORAL_TABLET | ORAL | Status: DC

## 2011-02-03 NOTE — Telephone Encounter (Signed)
rx called into pharmacy

## 2011-02-03 NOTE — Telephone Encounter (Signed)
Okay #90 x 0 

## 2011-02-07 NOTE — Assessment & Plan Note (Signed)
[facility]                                 ON-CALL NOTE   NAME:Smotherman, JACOBI RYANT                      MRN:          [id number]  DATE:05/30/2008                            DOB:          October 26, 1945    DATE OF INTERACTION:  May 30, 2008 at 12:44 p.m.   PHONE NUMBER:  [phone].   OBJECTIVE:  The patient had yellow jacket stings yesterday, 3.  They are  swollen and red.  Now she is being driven nuts by itching.  Spoke to the  [place] nurses if she could get steroids.  I would rather not do that.  She  has been using Benadryl, told her to get Claritin, Zyrtec, or Allegra  and take one of those and then augment with Benadryl.  She knows that  she should also ice it 20 minutes at a time every hour and she has  mometasone cream at home, steroid cream.  She may apply that twice a  day.   PRIMARY CARE Kei Mcelhiney:  Karie Schwalbe, MD, Office at [facility].     Arta Silence, MD  Electronically Signed    RNS/MedQ  DD: 05/30/2008  DT: 05/31/2008  Job #: [id number]

## 2011-02-10 NOTE — Op Note (Signed)
[facility]  Patient:    Alexandria Sherman, Alexandria Sherman Visit Number: [id number] MRN: [id number]          Service Type: DSU Location: [facility] Attending Physician:  Morene Antu Proc. Date: 05/15/01 Adm. Date:  05/15/2001                             Operative Report  PREOPERATIVE DIAGNOSIS:       Vulvar nodule.  POSTOPERATIVE DIAGNOSIS:      Vulvar nodule.  OPERATION:                    Wide local excision of vulvar nodule.  SURGEON:                      Sherry A. Rosalio Macadamia, M.D.  ANESTHESIA:                   MAC.  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  The patient has had a skin tag present for many years in the vulvar area which recently has grown.  The patient was seen in the office with the diagnosis of presumed condyloma.  She was treated with ______ x 2, initially 50%, the 80%, with no change in the size of the condyloma.  Because of this, the patient is brought to the operating room for wide excision.  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  DESCRIPTION OF PROCEDURE:     The patient is brought into the operating room and given adequate IV sedation.  She was placed in the dorsolithotomy position.  The perineum was washed with Betadine.  The abnormal area was outlined with a marking pen.  The area was infiltrated with 1% Xylocaine with epinephrine and bicarb.  The area was excised to attempt to leave a wide enough margin if this was significantly abnormal.  This was excised circumferentially with some deep tissue removed as well.  Because the upper portion of the nodular area was so close to the anus, very wide excision in this area was not possible.  The deep tissues were excised, and specimen was removed.  Small bleeding areas were closed with 2-0 Vicryl in deep interrupted stitches.  Adequate  hemostasis was obtained with approximation of the skin edges.  Skin was then closed with 4-0 chromic in a subcuticular running stitch.  Adequate hemostasis was present.  The patient was taken out of the dorsolithotomy position.  She was awakened, and she was moved from the operating table to a stretcher in stable condition.  Complications were none. Estimated blood loss was less than 5 cc. Attending Physician:  Morene Antu DD:  05/15/01 TD:  05/16/01 Job: [id number]

## 2011-02-24 HISTORY — PX: KNEE ARTHROSCOPY: SUR90

## 2011-02-27 ENCOUNTER — Telehealth: Payer: Self-pay | Admitting: Internal Medicine

## 2011-02-27 NOTE — Telephone Encounter (Signed)
Patient needed to know what anesthesia she got in 2006 for her colon.  She was given Fentanyl 100 mcg and Versed 10 mg.  She is having a procedure next week and needed to know.

## 2011-03-31 ENCOUNTER — Other Ambulatory Visit: Payer: Self-pay | Admitting: *Deleted

## 2011-03-31 MED ORDER — CLORAZEPATE DIPOTASSIUM 7.5 MG PO TABS: 3.7500 mg | ORAL_TABLET | Freq: Three times a day (TID) | ORAL | Status: DC | PRN

## 2011-03-31 NOTE — Telephone Encounter (Signed)
rx faxed to pharmacy manually  

## 2011-03-31 NOTE — Telephone Encounter (Signed)
Okay #90 x 0 

## 2011-03-31 NOTE — Telephone Encounter (Signed)
Form on your desk  

## 2011-04-13 ENCOUNTER — Ambulatory Visit: Payer: Self-pay | Admitting: Urology

## 2011-05-01 ENCOUNTER — Encounter: Payer: Self-pay | Admitting: Internal Medicine

## 2011-05-02 ENCOUNTER — Encounter: Payer: Self-pay | Admitting: Internal Medicine

## 2011-05-02 ENCOUNTER — Ambulatory Visit (INDEPENDENT_AMBULATORY_CARE_PROVIDER_SITE_OTHER): Payer: Medicare Other | Admitting: Internal Medicine

## 2011-05-02 ENCOUNTER — Encounter: Payer: BC Managed Care – PPO | Admitting: Internal Medicine

## 2011-05-02 ENCOUNTER — Ambulatory Visit: Payer: BC Managed Care – PPO | Admitting: Internal Medicine

## 2011-05-02 DIAGNOSIS — R7301 Impaired fasting glucose: Secondary | ICD-10-CM

## 2011-05-02 DIAGNOSIS — R945 Abnormal results of liver function studies: Secondary | ICD-10-CM

## 2011-05-02 DIAGNOSIS — Z13220 Encounter for screening for lipoid disorders: Secondary | ICD-10-CM

## 2011-05-02 DIAGNOSIS — R5383 Other fatigue: Secondary | ICD-10-CM

## 2011-05-02 DIAGNOSIS — F329 Major depressive disorder, single episode, unspecified: Secondary | ICD-10-CM

## 2011-05-02 DIAGNOSIS — K769 Liver disease, unspecified: Secondary | ICD-10-CM

## 2011-05-02 DIAGNOSIS — R946 Abnormal results of thyroid function studies: Secondary | ICD-10-CM | POA: Insufficient documentation

## 2011-05-02 LAB — BASIC METABOLIC PANEL
BUN: 23 mg/dL (ref 6–23)
CO2: 30 mEq/L (ref 19–32)
Calcium: 9.8 mg/dL (ref 8.4–10.5)
Chloride: 102 mEq/L (ref 96–112)
Creatinine, Ser: 0.8 mg/dL (ref 0.4–1.2)

## 2011-05-02 LAB — TSH: TSH: 0.12 u[IU]/mL — ABNORMAL LOW (ref 0.35–5.50)

## 2011-05-02 LAB — CBC WITH DIFFERENTIAL/PLATELET
Basophils Absolute: 0 10*3/uL (ref 0.0–0.1)
Eosinophils Absolute: 0.2 10*3/uL (ref 0.0–0.7)
Lymphocytes Relative: 33.5 % (ref 12.0–46.0)
MCHC: 32.9 g/dL (ref 30.0–36.0)
MCV: 89.8 fl (ref 78.0–100.0)
Monocytes Absolute: 0.5 10*3/uL (ref 0.1–1.0)
Neutrophils Relative %: 54.8 % (ref 43.0–77.0)
Platelets: 307 10*3/uL (ref 150.0–400.0)
RDW: 14.3 % (ref 11.5–14.6)

## 2011-05-02 LAB — HEPATIC FUNCTION PANEL
ALT: 24 U/L (ref 0–35)
Bilirubin, Direct: 0.1 mg/dL (ref 0.0–0.3)
Total Bilirubin: 0.7 mg/dL (ref 0.3–1.2)

## 2011-05-02 LAB — LIPID PANEL
HDL: 66.2 mg/dL (ref >39.00)
Total CHOL/HDL Ratio: 3
Triglycerides: 77 mg/dL (ref 0.0–149.0)
VLDL: 15.4 mg/dL (ref 0.0–40.0)

## 2011-05-02 LAB — T4, FREE: Free T4: 0.84 ng/dL (ref 0.60–1.60)

## 2011-05-02 NOTE — Progress Notes (Signed)
Subjective:    Patient ID: Alexandria Sherman, female    DOB: 1946-04-02, 65 y.o.   MRN: [id number]  HPI Has Wellness visit next week but is concerned about several issues first Is concerned about lack of energy, stamina and strength Worried about her thyroid Wants Lyme disease checked  Feels her depression is worse Not sure if this is related to lack of energy Reviewed that she does have intolerance to heat, excessive sweating as well  Still with knee pain Had left knee arthroscopy in June but still limps (Dr Charlann Boxer)  No chest pain No SOB  Current Outpatient Prescriptions on File Prior to Visit  Medication Sig Dispense Refill  . Aspirin-Calcium Carbonate (BAYER WOMENS) 81-300 MG TABS Take by mouth daily.        . Calcium Carbonate (CALCIUM 500 PO) Take one by mouth twice a day       . cholecalciferol (VITAMIN D) 1000 UNITS tablet Take 1,000 Units by mouth daily.        . clorazepate (TRANXENE) 7.5 MG tablet Take 0.5-1 tablets (3.75-7.5 mg total) by mouth 3 (three) times daily as needed.  90 tablet  0  . escitalopram (LEXAPRO) 20 MG tablet Take 20 mg by mouth daily.        . meclizine (ANTIVERT) 25 MG tablet Take 25 mg by mouth daily.        . mometasone (ELOCON) 0.1 % cream Apply 2 times a day as needed for itching       . Multiple Vitamin (MULTIVITAMIN) tablet Take over the counter as directed       . omeprazole (PRILOSEC) 20 MG capsule Take one tablet twice a day for acid reflux       . triamterene-hydrochlorothiazide (DYAZIDE) 37.5-25 MG per capsule Take 1 capsule by mouth every morning.          Allergies  Allergen Reactions  . Cephalexin     REACTION: blisters  . Codeine Phosphate     REACTION: vomiting  . BJY:NWGNFAOZHYQ+MVHQIONGE+XBMWUXLKGM Acid+Aspartame     REACTION: unspecified  . Promethazine Hcl     REACTION: nerves    Past Medical History  Diagnosis Date  . Depression 1978  . Diverticulosis of colon 04/2005  . GERD (gastroesophageal reflux disease)   . Sleep  disturbance   . Impaired fasting glucose   . Hypertension   . Low TSH level     chronic low, but euthyroid  . Microscopic hematuria   . Tibia fracture 05/2010    left    Past Surgical History  Procedure Date  . Breast lumpectomy 1988    right  . Abdominal hysterectomy 1988    hysterectomy/ BSO for endometriosis    Family History  Problem Relation Age of Onset  . Cancer Mother     lung  . Diabetes Mother   . Depression Mother   . Cancer Sister     oral cancer  . Depression Sister   . Depression Sister     History   Social History  . Marital Status: Divorced    Spouse Name: N/A    Number of Children: 0  . Years of Education: N/A   Occupational History  . 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 Smoking status: Never Smoker   . Smokeless tobacco: Never Used  . Alcohol Use: Yes     Occasional  . Drug Use: Not  on file  . Sexually Active: Not on file   Other Topics Concern  . Not on file   Social History Narrative   [name] is her cocker spanielTries to walk regularly.   Review of Systems Generally sleeps okay--though troubled for the past couple of nights Stomach has been fine Had recent CT of abdomen to recheck renal cysts which are stable (COpe). Liver abnormality has resolved. Internal hernia found    Objective:   Physical Exam  Constitutional: She appears well-developed and well-nourished. No distress.  Neck: Normal range of motion. Neck supple. No thyromegaly present.  Cardiovascular: Normal rate, regular rhythm, normal heart sounds and intact distal pulses.  Exam reveals no gallop.   No murmur heard. Pulmonary/Chest: Effort normal and breath sounds normal. No respiratory distress. She has no wheezes. She has no rales.  Abdominal: Soft. There is no tenderness.  Musculoskeletal: Normal range of motion. She exhibits no edema and no tenderness.       No synovitis    Lymphadenopathy:    She has no cervical adenopathy.    She has no axillary adenopathy.       Right: No inguinal adenopathy present.       Left: No inguinal adenopathy present.  Skin: No rash noted.  Psychiatric: She has a normal mood and affect. Her behavior is normal. Judgment and thought content normal.          Assessment & Plan:

## 2011-05-02 NOTE — Assessment & Plan Note (Signed)
This sounds similar to complaints from last summer as well Will recheck labs ??subclinical hyperthyroidism Probably related to depression if labs okay

## 2011-05-02 NOTE — Assessment & Plan Note (Signed)
Low TSH last time No nodules or gland enlargement Will check labs If TSH still low, will refer to endocrine

## 2011-05-02 NOTE — Assessment & Plan Note (Signed)
Consider adding bupropion to regimen if no other answer for the fatigue

## 2011-05-03 LAB — HEMOGLOBIN A1C: Hgb A1c MFr Bld: 6 % (ref 4.6–6.5)

## 2011-05-05 LAB — T3, FREE: T3, Free: 3.6 pg/mL (ref 2.3–4.2)

## 2011-05-09 ENCOUNTER — Encounter: Payer: Self-pay | Admitting: Internal Medicine

## 2011-05-09 ENCOUNTER — Encounter: Payer: Self-pay | Admitting: *Deleted

## 2011-05-09 ENCOUNTER — Ambulatory Visit (INDEPENDENT_AMBULATORY_CARE_PROVIDER_SITE_OTHER): Payer: Medicare Other | Admitting: Internal Medicine

## 2011-05-09 DIAGNOSIS — Z0000 Encounter for general adult medical examination without abnormal findings: Secondary | ICD-10-CM

## 2011-05-09 DIAGNOSIS — F329 Major depressive disorder, single episode, unspecified: Secondary | ICD-10-CM

## 2011-05-09 DIAGNOSIS — R946 Abnormal results of thyroid function studies: Secondary | ICD-10-CM

## 2011-05-09 MED ORDER — BUPROPION HCL ER (XL) 150 MG PO TB24: 150.0000 mg | ORAL_TABLET | Freq: Every day | ORAL | Status: DC

## 2011-05-09 NOTE — Patient Instructions (Signed)
Please set up the bone density test----requests [employer]

## 2011-05-09 NOTE — Assessment & Plan Note (Signed)
Ongoing symptoms despite the lexapro Probably the reason for the fatigue We will try augmentation with bupropion

## 2011-05-09 NOTE — Progress Notes (Signed)
Subjective:    Patient ID: Alexandria Sherman, female    DOB: [birth year], 65 y.o.   MRN: [id number]  HPI Here for Wellness exam Reviewed labs with her Only question is the low TSH but free T4 and T3 are normal  Reviewed her Wellness form Had 1 fall with fractured tibia and then arthroscopy on knee Rubber sole on shoe stuck on carpet and leg was twisted Has had other falls Notes that she often has problems when she is carrying things. 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 Aspirin-Calcium Carbonate (BAYER WOMENS) 81-300 MG TABS Take by mouth daily.        . Calcium Carbonate (CALCIUM 500 PO) Take one by mouth twice a day       . cholecalciferol (VITAMIN D) 1000 UNITS tablet Take 1,000 Units by mouth daily.        . clorazepate (TRANXENE) 7.5 MG tablet Take 0.5-1 tablets (3.75-7.5 mg total) by mouth 3 (three) times daily as needed.  90 tablet  0  . escitalopram (LEXAPRO) 20 MG tablet Take 20 mg by mouth daily.        . meclizine (ANTIVERT) 25 MG tablet Take 25 mg by mouth daily.        . mometasone (ELOCON) 0.1 % cream Apply 2 times a day as needed for itching       . Multiple Vitamin (MULTIVITAMIN) tablet Take over the counter as directed       . omeprazole (PRILOSEC) 20 MG capsule Take one tablet twice a day for acid reflux       . triamterene-hydrochlorothiazide (DYAZIDE) 37.5-25 MG per capsule Take 1 capsule by mouth every morning.          Allergies  Allergen Reactions  . Cephalexin     REACTION: blisters  . Codeine Phosphate     REACTION: vomiting  . FAO:ZHYQMVHQION+GEXBMWUXL+KGMWNUUVOZ Acid+Aspartame     REACTION:  unspecified  . Promethazine Hcl     REACTION: nerves    Past Medical History  Diagnosis Date  . Depression 1978  . Diverticulosis of colon 04/2005  . GERD (gastroesophageal reflux disease)   . Sleep disturbance   . Impaired fasting glucose   . Hypertension   . Low TSH level     chronic low, but euthyroid  . Microscopic hematuria   . Tibia fracture 05/2010    left    Past Surgical History  Procedure Date  . Breast lumpectomy 1988    right  . Abdominal hysterectomy 1988    hysterectomy/ BSO for endometriosis  . Knee arthroscopy 6/12    left--Dr Charlann Boxer    Family History  Problem Relation Age of Onset  . Cancer Mother     lung  . Diabetes Mother   . Depression Mother   . Cancer Sister     oral cancer  . Depression Sister   . Depression Sister     History   Social History  . Marital Status: Divorced    Spouse Name: N/A    Number of Children: 0  . Years of Education: N/A   Occupational History  . 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 Smoking status: Never Smoker   . Smokeless tobacco: Never Used  . Alcohol Use: Yes     Occasional  . Drug Use: Not on file  . Sexually Active: Not on file   Other Topics Concern  . Not on file   Social History Narrative   [name] is her cocker spanielTries to walk regularly.   Review of Systems Sleeps okay--uses the tranxene at night Rarely takes on during the day if her anxiety has really acted up Appetite is fine Weight is pretty stable Has been considering water aerobics as we discussed but is slowly moving towards this    Objective:   Physical Exam  Constitutional: She is oriented to person, place, and time. She appears well-developed and well-nourished. No distress.  Neurological: She is alert and oriented to person, place, and time.       Phillips Grout" [id number] Recall 2/3  Psychiatric: Her  behavior is normal. Judgment and thought content normal.       Neutral mood with appropriate affect          Assessment & Plan:

## 2011-05-09 NOTE — Assessment & Plan Note (Signed)
Persistent low TSH with normal T4 and T3 I doubt she is really hyperthyroid She is considering evaluation with Dr Shelda Altes at [facility]

## 2011-05-09 NOTE — Assessment & Plan Note (Signed)
I have personally reviewed the Medicare Annual Wellness questionnaire and have noted 1. The patient's medical and social history 2. Their use of alcohol, tobacco or illicit drugs 3. Their current medications and supplements 4. The patient's functional ability including ADL's, fall risks, home safety risks and hearing or visual             impairment. 5. Diet and physical activities 6. Evidence for depression or mood disorders  The patients weight, height, BMI and visual acuity have been recorded in the chart I have made referrals, counseling and provided education to the patient based review of the above and I have provided the pt with a written personalized care plan for preventive services.  I have provided you with a copy of your personalized plan for preventive services. Please take the time to review along with your updated medication list.  Has had recurrent falls She has worked on home safety Rarely uses benzodiazepine during the day UTD on imms and cancer screening Discussed that no actions needed now except to check bone density

## 2011-05-16 ENCOUNTER — Encounter: Payer: Self-pay | Admitting: Internal Medicine

## 2011-05-18 ENCOUNTER — Telehealth: Payer: Self-pay | Admitting: *Deleted

## 2011-05-18 NOTE — Telephone Encounter (Signed)
There would be no way to know at this point and nothing to do to prevent a viral infection. Some can be spread from person to person (some stomach viruses that can cause mild meningitis as well);  and some can't (like the [place] Nile virus that has been in the news)

## 2011-05-18 NOTE — Telephone Encounter (Signed)
Pt states her brother in law may have viral meningitis and she is asking if she should be concerned because he prepared her food about 2 weeks ago, when his symptoms first started.  His doctors arent sure of that diagnosis, but that's what they are leaning towards.

## 2011-05-18 NOTE — Telephone Encounter (Signed)
.  left message to have patient return my call.  

## 2011-05-19 NOTE — Telephone Encounter (Signed)
Advised pt, now doctor's are thinking that her brother in law may have RMSF instead.

## 2011-05-25 ENCOUNTER — Other Ambulatory Visit: Payer: Self-pay | Admitting: *Deleted

## 2011-05-25 MED ORDER — CLORAZEPATE DIPOTASSIUM 7.5 MG PO TABS: 3.7500 mg | ORAL_TABLET | Freq: Three times a day (TID) | ORAL | Status: DC | PRN

## 2011-05-25 NOTE — Telephone Encounter (Signed)
Form on your desk  

## 2011-05-25 NOTE — Telephone Encounter (Signed)
rx faxed to pharmacy manually  

## 2011-05-25 NOTE — Telephone Encounter (Signed)
Okay #90 x 0 

## 2011-06-16 ENCOUNTER — Other Ambulatory Visit: Payer: Self-pay | Admitting: *Deleted

## 2011-06-16 MED ORDER — ESCITALOPRAM OXALATE 20 MG PO TABS: 20.0000 mg | ORAL_TABLET | Freq: Every day | ORAL | Status: DC

## 2011-06-16 NOTE — Telephone Encounter (Signed)
rx called into pharmacy

## 2011-06-16 NOTE — Telephone Encounter (Signed)
Alexandria Sherman PATIENT, ok to refill? 

## 2011-07-12 ENCOUNTER — Encounter: Payer: Self-pay | Admitting: Internal Medicine

## 2011-07-12 ENCOUNTER — Ambulatory Visit (INDEPENDENT_AMBULATORY_CARE_PROVIDER_SITE_OTHER): Payer: Medicare Other | Admitting: Internal Medicine

## 2011-07-12 VITALS — BP 126/96 | HR 86 | Temp 98.0°F | Ht 65.0 in | Wt 217.0 lb

## 2011-07-12 DIAGNOSIS — Z23 Encounter for immunization: Secondary | ICD-10-CM

## 2011-07-12 DIAGNOSIS — F329 Major depressive disorder, single episode, unspecified: Secondary | ICD-10-CM

## 2011-07-12 DIAGNOSIS — Z1231 Encounter for screening mammogram for malignant neoplasm of breast: Secondary | ICD-10-CM

## 2011-07-12 NOTE — Progress Notes (Signed)
Subjective:    Patient ID: Alexandria Sherman, female    DOB: 11-16-1945, 65 y.o.   MRN: [id number]  HPI Here for follow up of the depression Still on the bupropion No apparent side effects Feels improved though not striking  Still not great energy levels This is better though  Still concerned about her thyroid Levels were normal Will consider eval by Dr Tedd Sias at [facility]  Sleeps is good with tranxene Appetite is good  Current Outpatient Prescriptions on File Prior to Visit  Medication Sig Dispense Refill  . Aspirin-Calcium Carbonate (BAYER WOMENS) 81-300 MG TABS Take by mouth daily.        Marland Kitchen buPROPion (WELLBUTRIN XL) 150 MG 24 hr tablet Take 1 tablet (150 mg total) by mouth daily.  30 tablet  11  . Calcium Carbonate (CALCIUM 500 PO) Take one by mouth twice a day       . cholecalciferol (VITAMIN D) 1000 UNITS tablet Take 1,000 Units by mouth daily.        . clorazepate (TRANXENE) 7.5 MG tablet Take 0.5-1 tablets (3.75-7.5 mg total) by mouth 3 (three) times daily as needed.  90 tablet  0  . escitalopram (LEXAPRO) 20 MG tablet Take 1 tablet (20 mg total) by mouth daily.  30 tablet  3  . meclizine (ANTIVERT) 25 MG tablet Take 25 mg by mouth as needed.       . mometasone (ELOCON) 0.1 % cream Apply 2 times a day as needed for itching       . Multiple Vitamin (MULTIVITAMIN) tablet Take over the counter as directed       . omeprazole (PRILOSEC) 20 MG capsule Take one tablet twice a day for acid reflux       . triamterene-hydrochlorothiazide (DYAZIDE) 37.5-25 MG per capsule Take 1 capsule by mouth as needed. For swelling        Allergies  Allergen Reactions  . Cephalexin     REACTION: blisters  . Codeine Phosphate     REACTION: vomiting  . NWG:NFAOZHYQMVH+QIONGEXBM+WUXLKGMWNU Acid+Aspartame     REACTION: unspecified  . Promethazine Hcl     REACTION: nerves    Past Medical History  Diagnosis Date  . Depression 1978  . Diverticulosis of colon 04/2005  . GERD (gastroesophageal reflux  disease)   . Sleep disturbance   . Impaired fasting glucose   . Hypertension   . Low TSH level     chronic low, but euthyroid  . Microscopic hematuria   . Tibia fracture 05/2010    left    Past Surgical History  Procedure Date  . Breast lumpectomy 1988    right  . Abdominal hysterectomy 1988    hysterectomy/ BSO for endometriosis  . Knee arthroscopy 6/12    left--Dr Charlann Boxer    Family History  Problem Relation Age of Onset  . Cancer Mother     lung  . Diabetes Mother   . Depression Mother   . Cancer Sister     oral cancer  . Depression Sister   . Depression Sister     History   Social History  . Marital Status: Divorced    Spouse Name: Alexandria Sherman    Number of Children: 0  . Years of Education: Alexandria Sherman   Occupational History  . 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 Smoking status: Never Smoker   . Smokeless tobacco: Never Used  .  Alcohol Use: Yes     Occasional  . Drug Use: Not on file  . Sexually Active: Not on file   Other Topics Concern  . Not on file   Social History Narrative   Alexandria Sherman is her cocker spanielTries to walk regularly.    Review of Systems Still only uses diuretic prn for edema BP generally okay    Objective:   Physical Exam  Constitutional: She appears well-developed and well-nourished. No distress.  Skin:       ~1.5cm apparent cyst in upper right back No inflammation  Psychiatric: She has a normal mood and affect. Her behavior is normal. Judgment and thought content normal.          Assessment & Plan:

## 2011-07-12 NOTE — Assessment & Plan Note (Signed)
Feels improved Doesn't want more for now but discussed that if she worsens--we should go up to 300mg  daily Continues on the lexapro

## 2011-07-12 NOTE — Patient Instructions (Signed)
Schedule mammogram.

## 2011-08-14 ENCOUNTER — Other Ambulatory Visit: Payer: Self-pay | Admitting: *Deleted

## 2011-08-14 MED ORDER — CLORAZEPATE DIPOTASSIUM 7.5 MG PO TABS: 3.7500 mg | ORAL_TABLET | Freq: Three times a day (TID) | ORAL | Status: DC | PRN

## 2011-08-14 NOTE — Telephone Encounter (Signed)
Okay #90 x 0 

## 2011-08-14 NOTE — Telephone Encounter (Signed)
rx called into pharmacy

## 2011-08-22 ENCOUNTER — Telehealth: Payer: Self-pay | Admitting: *Deleted

## 2011-08-22 NOTE — Telephone Encounter (Signed)
okay

## 2011-08-22 NOTE — Telephone Encounter (Signed)
Copy of labs from 8/7 faxed to [facility], fax number [phone].

## 2011-09-06 ENCOUNTER — Ambulatory Visit: Payer: Self-pay | Admitting: Internal Medicine

## 2011-09-11 ENCOUNTER — Encounter: Payer: Self-pay | Admitting: *Deleted

## 2011-09-27 ENCOUNTER — Other Ambulatory Visit: Payer: Self-pay | Admitting: *Deleted

## 2011-09-27 MED ORDER — OMEPRAZOLE 20 MG PO CPDR: 20.0000 mg | DELAYED_RELEASE_CAPSULE | Freq: Two times a day (BID) | ORAL | Status: DC

## 2011-10-13 ENCOUNTER — Other Ambulatory Visit: Payer: Self-pay | Admitting: *Deleted

## 2011-10-13 MED ORDER — CLORAZEPATE DIPOTASSIUM 7.5 MG PO TABS: 3.7500 mg | ORAL_TABLET | Freq: Three times a day (TID) | ORAL | Status: DC | PRN

## 2011-10-13 NOTE — Telephone Encounter (Signed)
rx called into pharmacy

## 2011-10-13 NOTE — Telephone Encounter (Signed)
Okay #90 x 0 

## 2011-10-23 ENCOUNTER — Other Ambulatory Visit: Payer: Self-pay | Admitting: *Deleted

## 2011-10-24 MED ORDER — ESCITALOPRAM OXALATE 20 MG PO TABS: 20.0000 mg | ORAL_TABLET | Freq: Every day | ORAL | Status: DC

## 2011-10-24 NOTE — Telephone Encounter (Signed)
Rx sent electronically.  

## 2011-12-11 ENCOUNTER — Other Ambulatory Visit: Payer: Self-pay | Admitting: *Deleted

## 2011-12-11 NOTE — Telephone Encounter (Signed)
Patient is requesting a new Rx for Patanol.  She stated that the original Rx was written by Dr. Particia Nearing at [facility].  Uses [facility].  Please advise.

## 2011-12-12 MED ORDER — OLOPATADINE HCL 0.1 % OP SOLN: 1.0000 [drp] | Freq: Two times a day (BID) | OPHTHALMIC | Status: DC

## 2011-12-12 NOTE — Telephone Encounter (Signed)
It is an allergy drop that can be used chronically It should be 1 drop in each eye bid prn for allergic eye symptoms

## 2011-12-12 NOTE — Telephone Encounter (Signed)
The original rx was supposed to only be for 7 days, still ok to fill? See fax request on your desk.

## 2011-12-12 NOTE — Telephone Encounter (Signed)
rx sent to pharmacy by e-script  

## 2011-12-12 NOTE — Telephone Encounter (Signed)
Okay to refill the patanol I think it is 1 drop each eye bid prn for allergies but you should confirm with the pharmacist

## 2011-12-25 ENCOUNTER — Other Ambulatory Visit: Payer: Self-pay

## 2011-12-25 ENCOUNTER — Other Ambulatory Visit: Payer: Self-pay | Admitting: *Deleted

## 2011-12-25 MED ORDER — CLORAZEPATE DIPOTASSIUM 7.5 MG PO TABS: 3.7500 mg | ORAL_TABLET | Freq: Three times a day (TID) | ORAL | Status: DC | PRN

## 2011-12-25 NOTE — Telephone Encounter (Signed)
Okay #90 x 0 

## 2011-12-25 NOTE — Telephone Encounter (Signed)
rx called into pharmacy

## 2011-12-25 NOTE — Telephone Encounter (Signed)
Last refilled 10/13/2011

## 2011-12-25 NOTE — Telephone Encounter (Signed)
Pt left v/m that Tranxene had not been called in to [facility]. I called [facility] and they did receive refill for Tranxene. I left v/m for pt to call back.

## 2011-12-25 NOTE — Telephone Encounter (Signed)
Patient notified as instructed by telephone that med was sent to [facility]. Pt wondered why we did not call in on Friday. I explained our office was closed on Friday for the Easter holiday. Pt appreciated call.

## 2012-01-17 ENCOUNTER — Ambulatory Visit (INDEPENDENT_AMBULATORY_CARE_PROVIDER_SITE_OTHER): Payer: Medicare Other | Admitting: Internal Medicine

## 2012-01-17 ENCOUNTER — Encounter: Payer: Self-pay | Admitting: Internal Medicine

## 2012-01-17 VITALS — BP 108/68 | HR 95 | Temp 97.7°F | Ht 65.0 in | Wt 190.0 lb

## 2012-01-17 DIAGNOSIS — E0590 Thyrotoxicosis, unspecified without thyrotoxic crisis or storm: Secondary | ICD-10-CM

## 2012-01-17 DIAGNOSIS — I10 Essential (primary) hypertension: Secondary | ICD-10-CM

## 2012-01-17 DIAGNOSIS — G479 Sleep disorder, unspecified: Secondary | ICD-10-CM

## 2012-01-17 DIAGNOSIS — F329 Major depressive disorder, single episode, unspecified: Secondary | ICD-10-CM

## 2012-01-17 DIAGNOSIS — G478 Other sleep disorders: Secondary | ICD-10-CM

## 2012-01-17 MED ORDER — MECLIZINE HCL 25 MG PO TABS: 25.0000 mg | ORAL_TABLET | ORAL | Status: DC | PRN

## 2012-01-17 NOTE — Progress Notes (Signed)
Subjective:    Patient ID: WESTON FULCO, female    DOB: [birth year], 66 y.o.   MRN: [id number]  HPI Did see Dr Tedd Sias Mild hyperthyroidism On low dose methimazole  Still sees Dr Achilles Dunk for renal cysts Had been seeing Dr Ocie Doyne function has been fine though Doesn't need ongoing nephrology evals  Mood has been fine Satisfied with the depression treatment  Still has some lack of energy Has balance issues---has to be careful walking Discussed using cane or walking stick Worried about ALS, Parkinsons, etc. No symptoms to suggest this  No chest pain No SOB Some degree of occ dizziness--does use the antivert at times (mostly spring and fall)  Current Outpatient Prescriptions on File Prior to Visit  Medication Sig Dispense Refill  . Aspirin-Calcium Carbonate (BAYER WOMENS) 81-300 MG TABS Take by mouth daily.        Marland Kitchen buPROPion (WELLBUTRIN XL) 150 MG 24 hr tablet Take 1 tablet (150 mg total) by mouth daily.  30 tablet  11  . Calcium Carbonate (CALCIUM 500 PO) Take one by mouth twice a day       . cholecalciferol (VITAMIN D) 1000 UNITS tablet Take 1,000 Units by mouth daily.        . clorazepate (TRANXENE) 7.5 MG tablet Take 0.5-1 tablets (3.75-7.5 mg total) by mouth 3 (three) times daily as needed.  90 tablet  0  . escitalopram (LEXAPRO) 20 MG tablet Take 1 tablet (20 mg total) by mouth daily.  30 tablet  11  . methimazole (TAPAZOLE) 5 MG tablet Take 5 mg by mouth daily.       . mometasone (ELOCON) 0.1 % cream Apply 2 times a day as needed for itching       . Multiple Vitamin (MULTIVITAMIN) tablet Take over the counter as directed       . olopatadine (PATANOL) 0.1 % ophthalmic solution Place 1 drop into both eyes 2 (two) times daily.  5 mL  0  . PRILOSEC OTC 20 MG tablet Take 20 mg by mouth 2 (two) times daily.       Marland Kitchen triamterene-hydrochlorothiazide (DYAZIDE) 37.5-25 MG per capsule Take 1 capsule by mouth as needed. For swelling        Allergies  Allergen Reactions  .  Cephalexin     REACTION: blisters  . Codeine Phosphate     REACTION: vomiting  . YQM:VHQIONGEXBM+WUXLKGMWN+UUVOZDGUYQ Acid+Aspartame     REACTION: unspecified  . Promethazine Hcl     REACTION: nerves    Past Medical History  Diagnosis Date  . Depression 1978  . Diverticulosis of colon 04/2005  . GERD (gastroesophageal reflux disease)   . Sleep disturbance   . Impaired fasting glucose   . Hypertension   . Low TSH level     chronic low, but euthyroid  . Microscopic hematuria   . Tibia fracture 05/2010    left  . Hyperthyroidism     Dr Tedd Sias    Past Surgical History  Procedure Date  . Breast lumpectomy 1988    right  . Abdominal hysterectomy 1988    hysterectomy/ BSO for endometriosis  . Knee arthroscopy 6/12    left--Dr Charlann Boxer    Family History  Problem Relation Age of Onset  . Cancer Mother     lung  . Diabetes Mother   . Depression Mother   . Cancer Sister     oral cancer  . Depression Sister   . Depression Sister     History  Social History  . Marital Status: Divorced    Spouse Name: N/A    Number of Children: 0  . Years of Education: N/A   Occupational History  . 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 Smoking status: Never Smoker   . Smokeless tobacco: Never Used  . Alcohol Use: Yes     Occasional  . Drug Use: Not on file  . Sexually Active: Not on file   Other Topics Concern  . Not on file   Social History Narrative   [name] is her cocker spanielTries to walk regularly.   Review of Systems Sleeps okay---uses tranxene nightly Appetite is fine--trying to eat right Has lost about 20# in past 4 months    Objective:   Physical Exam  Constitutional: She appears well-developed and well-nourished. No distress.  Neck: Normal range of motion. Neck supple.  Cardiovascular: Normal rate, regular rhythm and normal heart sounds.  Exam reveals no gallop.   No murmur  heard. Pulmonary/Chest: Effort normal and breath sounds normal. No respiratory distress. She has no wheezes. She has no rales.  Musculoskeletal: She exhibits no edema and no tenderness.  Lymphadenopathy:    She has no cervical adenopathy.  Psychiatric: She has a normal mood and affect. Her behavior is normal.          Assessment & Plan:

## 2012-01-17 NOTE — Assessment & Plan Note (Signed)
Mood is fine Will continue the meds 

## 2012-01-17 NOTE — Assessment & Plan Note (Signed)
On methimazole per Dr Tedd Sias

## 2012-01-17 NOTE — Assessment & Plan Note (Signed)
Happy with the tranxene Will try without it

## 2012-01-17 NOTE — Assessment & Plan Note (Addendum)
BP Readings from Last 3 Encounters:  01/17/12 108/68  07/12/11 126/96  05/09/11 122/80   Better with weight loss Only uses diuretic for edema prn

## 2012-03-08 ENCOUNTER — Other Ambulatory Visit: Payer: Self-pay | Admitting: *Deleted

## 2012-03-08 MED ORDER — CLORAZEPATE DIPOTASSIUM 7.5 MG PO TABS: 3.7500 mg | ORAL_TABLET | Freq: Three times a day (TID) | ORAL | Status: DC | PRN

## 2012-03-08 NOTE — Telephone Encounter (Signed)
Okay #90 x 0 

## 2012-03-08 NOTE — Telephone Encounter (Signed)
rx called into pharmacy

## 2012-05-28 ENCOUNTER — Other Ambulatory Visit: Payer: Self-pay | Admitting: *Deleted

## 2012-05-28 MED ORDER — CLORAZEPATE DIPOTASSIUM 7.5 MG PO TABS: 3.7500 mg | ORAL_TABLET | Freq: Three times a day (TID) | ORAL | Status: DC | PRN

## 2012-05-28 NOTE — Telephone Encounter (Signed)
rx called into pharmacy Spoke with patient and advised results   

## 2012-05-28 NOTE — Telephone Encounter (Signed)
Pt request call back after tranxene refilled [phone].

## 2012-05-28 NOTE — Telephone Encounter (Signed)
Okay #90 x 0 

## 2012-06-12 ENCOUNTER — Other Ambulatory Visit: Payer: Self-pay | Admitting: *Deleted

## 2012-06-12 MED ORDER — BUPROPION HCL ER (XL) 150 MG PO TB24: 150.0000 mg | ORAL_TABLET | Freq: Every day | ORAL | Status: DC

## 2012-06-12 MED ORDER — MECLIZINE HCL 25 MG PO TABS: 25.0000 mg | ORAL_TABLET | ORAL | Status: DC | PRN

## 2012-06-12 NOTE — Telephone Encounter (Signed)
Patient calling upset that we had not received her refill requests, advised pt as refills come in we fill them, pt needed Wellbutrin and Antivert, rx sent to pharmacy by e-script.

## 2012-07-31 ENCOUNTER — Other Ambulatory Visit: Payer: Self-pay | Admitting: *Deleted

## 2012-07-31 MED ORDER — CLORAZEPATE DIPOTASSIUM 7.5 MG PO TABS: 3.7500 mg | ORAL_TABLET | Freq: Three times a day (TID) | ORAL | Status: DC | PRN

## 2012-07-31 NOTE — Telephone Encounter (Signed)
Medication phoned to [facility] pharmacy as instructed.

## 2012-07-31 NOTE — Telephone Encounter (Signed)
Okay #90 x 0 

## 2012-08-01 ENCOUNTER — Encounter: Payer: Self-pay | Admitting: Internal Medicine

## 2012-08-01 ENCOUNTER — Ambulatory Visit (INDEPENDENT_AMBULATORY_CARE_PROVIDER_SITE_OTHER): Payer: Medicare Other | Admitting: Internal Medicine

## 2012-08-01 VITALS — BP 148/80 | HR 85 | Temp 98.8°F | Wt 208.0 lb

## 2012-08-01 DIAGNOSIS — R29818 Other symptoms and signs involving the nervous system: Secondary | ICD-10-CM

## 2012-08-01 DIAGNOSIS — G629 Polyneuropathy, unspecified: Secondary | ICD-10-CM | POA: Insufficient documentation

## 2012-08-01 DIAGNOSIS — E0590 Thyrotoxicosis, unspecified without thyrotoxic crisis or storm: Secondary | ICD-10-CM

## 2012-08-01 DIAGNOSIS — R2689 Other abnormalities of gait and mobility: Secondary | ICD-10-CM

## 2012-08-01 NOTE — Assessment & Plan Note (Signed)
Treated for subclinical disease with methimazole Symptoms not better with Rx but no worse Will check labs again and forward to Dr Tedd Sias

## 2012-08-01 NOTE — Assessment & Plan Note (Signed)
Waddles with walking Normal ROM in hips Not ataxic B12 just checked and was normal No hard neuro findings (though has some proximal arm weakness---perhaps it is leg weakness causing the problem) Will recheck labs PT referral Doesn't seem to have neuro findings requiring imaging or neuro consult

## 2012-08-01 NOTE — Progress Notes (Signed)
Subjective:    Patient ID: Alexandria Sherman, female    DOB: Jan 05, 1946, 66 y.o.   MRN: [id number]  HPI Is worried about her health Brings in form with multiple concerns--see scanned form  No energy or stamina Still concerned about her gait---sways side to side Needs handrail to go up or down stairs Balance concerns Wonders if this is brain related---like cerebellar lesion ?should we go ahead with PT consult Intermittent feeling of needles in left arm down to thumb Has been falling ---lots as a child-- twice in past 3 months (once on unequal floor, another time when legs gave out)  Current Outpatient Prescriptions on File Prior to Visit  Medication Sig Dispense Refill  . Aspirin-Calcium Carbonate (BAYER WOMENS) 81-300 MG TABS Take by mouth daily.        Marland Kitchen buPROPion (WELLBUTRIN XL) 150 MG 24 hr tablet Take 1 tablet (150 mg total) by mouth daily.  90 tablet  3  . Calcium Carbonate (CALCIUM 500 PO) Take one by mouth twice a day       . cholecalciferol (VITAMIN D) 1000 UNITS tablet Take 1,000 Units by mouth daily.        . clorazepate (TRANXENE) 7.5 MG tablet Take 0.5-1 tablets (3.75-7.5 mg total) by mouth 3 (three) times daily as needed.  90 tablet  0  . escitalopram (LEXAPRO) 20 MG tablet Take 1 tablet (20 mg total) by mouth daily.  30 tablet  11  . latanoprost (XALATAN) 0.005 % ophthalmic solution Place 1 drop into both eyes at bedtime.       . meclizine (ANTIVERT) 25 MG tablet Take 1 tablet (25 mg total) by mouth as needed.  90 tablet  3  . mometasone (ELOCON) 0.1 % cream Apply 2 times a day as needed for itching       . Multiple Vitamin (MULTIVITAMIN) tablet Take over the counter as directed       . olopatadine (PATANOL) 0.1 % ophthalmic solution Place 1 drop into both eyes 2 (two) times daily.  5 mL  0  . PRILOSEC OTC 20 MG tablet Take 20 mg by mouth 2 (two) times daily. BRAND NAME ONLY      . triamterene-hydrochlorothiazide (DYAZIDE) 37.5-25 MG per capsule Take 1 capsule by mouth as  needed. For swelling        Allergies  Allergen Reactions  . Amoxicillin-Pot Clavulanate     REACTION: unspecified  . Cephalexin     REACTION: blisters  . Codeine Phosphate     REACTION: vomiting  . Promethazine Hcl     REACTION: nerves    Past Medical History  Diagnosis Date  . Depression 1978  . Diverticulosis of colon 04/2005  . GERD (gastroesophageal reflux disease)   . Sleep disturbance   . Impaired fasting glucose   . Hypertension   . Low TSH level     chronic low, but euthyroid  . Microscopic hematuria   . Tibia fracture 05/2010    left  . Hyperthyroidism     Dr Tedd Sias    Past Surgical History  Procedure Date  . Breast lumpectomy 1988    right  . Abdominal hysterectomy 1988    hysterectomy/ BSO for endometriosis  . Knee arthroscopy 6/12    left--Dr Charlann Boxer    Family History  Problem Relation Age of Onset  . Cancer Mother     lung  . Diabetes Mother   . Depression Mother   . Cancer Sister     oral  cancer  . Depression Sister   . Depression Sister     History   Social History  . Marital Status: Divorced    Spouse Name: N/A    Number of Children: 0  . Years of Education: N/A   Occupational History  . 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 Smoking status: Never Smoker   . Smokeless tobacco: Never Used  . Alcohol Use: Yes     Comment: Occasional  . Drug Use: Not on file  . Sexually Active: Not on file   Other Topics Concern  . Not on file   Social History Narrative   [name] is her cocker spanielTries to walk regularly.     Review of Systems No diplopia or unilateral vision loss No facial droop No aphasia or dysphagia No sig memory changes No back pain (sister has spinal stenosis) Slight right hand tremor No change in handwriting    Objective:   Physical Exam  Constitutional: She appears well-developed and well-nourished. No distress.  Neck: Normal  range of motion. Neck supple.  Cardiovascular: Normal rate and normal heart sounds.        Regular with occ skipped beats  Pulmonary/Chest: Effort normal and breath sounds normal. No respiratory distress. She has no wheezes. She has no rales.  Musculoskeletal: She exhibits no edema and no tenderness.  Lymphadenopathy:    She has no cervical adenopathy.  Neurological: She is alert. She displays no tremor. No cranial nerve deficit. She exhibits normal muscle tone. She displays a negative Romberg sign. Gait abnormal. Coordination normal.       Normal finger to nose Gait is mildly "waddling" but not ataxic No bradykinesia          Assessment & Plan:

## 2012-08-02 ENCOUNTER — Encounter: Payer: Self-pay | Admitting: *Deleted

## 2012-08-02 LAB — SEDIMENTATION RATE: Sed Rate: 29 mm/hr — ABNORMAL HIGH (ref 0–22)

## 2012-08-02 LAB — BASIC METABOLIC PANEL
BUN: 16 mg/dL (ref 6–23)
Chloride: 104 mEq/L (ref 96–112)
GFR: 89.99 mL/min (ref >60.00)
Potassium: 4.1 mEq/L (ref 3.5–5.1)

## 2012-08-02 LAB — T4, FREE: Free T4: 0.66 ng/dL (ref 0.60–1.60)

## 2012-08-02 LAB — T3, FREE: T3, Free: 3 pg/mL (ref 2.3–4.2)

## 2012-08-02 LAB — CBC WITH DIFFERENTIAL/PLATELET
Basophils Relative: 1.2 % (ref 0.0–3.0)
Eosinophils Relative: 3.3 % (ref 0.0–5.0)
HCT: 39.6 % (ref 36.0–46.0)
Lymphs Abs: 1.5 10*3/uL (ref 0.7–4.0)
MCV: 92.8 fl (ref 78.0–100.0)
Monocytes Relative: 10.4 % (ref 3.0–12.0)
Neutrophils Relative %: 60.7 % (ref 43.0–77.0)
Platelets: 246 10*3/uL (ref 150.0–400.0)
RBC: 4.26 Mil/uL (ref 3.87–5.11)
WBC: 6 10*3/uL (ref 4.5–10.5)

## 2012-08-02 LAB — HEPATIC FUNCTION PANEL
ALT: 23 U/L (ref 0–35)
AST: 33 U/L (ref 0–37)
Bilirubin, Direct: 0.1 mg/dL (ref 0.0–0.3)
Total Bilirubin: 0.5 mg/dL (ref 0.3–1.2)
Total Protein: 7.6 g/dL (ref 6.0–8.3)

## 2012-08-06 ENCOUNTER — Encounter: Payer: Self-pay | Admitting: Internal Medicine

## 2012-08-25 ENCOUNTER — Encounter: Payer: Self-pay | Admitting: Internal Medicine

## 2012-09-09 ENCOUNTER — Ambulatory Visit: Payer: Self-pay | Admitting: Internal Medicine

## 2012-09-09 ENCOUNTER — Encounter: Payer: Self-pay | Admitting: Internal Medicine

## 2012-09-12 DIAGNOSIS — N181 Chronic kidney disease, stage 1: Secondary | ICD-10-CM | POA: Insufficient documentation

## 2012-09-16 ENCOUNTER — Encounter: Payer: Self-pay | Admitting: Internal Medicine

## 2012-09-16 ENCOUNTER — Ambulatory Visit (INDEPENDENT_AMBULATORY_CARE_PROVIDER_SITE_OTHER): Payer: Medicare Other | Admitting: Internal Medicine

## 2012-09-16 VITALS — BP 128/80 | HR 89 | Temp 98.3°F | Ht 65.0 in | Wt 205.0 lb

## 2012-09-16 DIAGNOSIS — G479 Sleep disorder, unspecified: Secondary | ICD-10-CM

## 2012-09-16 DIAGNOSIS — I10 Essential (primary) hypertension: Secondary | ICD-10-CM

## 2012-09-16 DIAGNOSIS — R2689 Other abnormalities of gait and mobility: Secondary | ICD-10-CM

## 2012-09-16 DIAGNOSIS — Z0000 Encounter for general adult medical examination without abnormal findings: Secondary | ICD-10-CM

## 2012-09-16 DIAGNOSIS — R29818 Other symptoms and signs involving the nervous system: Secondary | ICD-10-CM

## 2012-09-16 DIAGNOSIS — F329 Major depressive disorder, single episode, unspecified: Secondary | ICD-10-CM

## 2012-09-16 MED ORDER — CLORAZEPATE DIPOTASSIUM 7.5 MG PO TABS: 3.7500 mg | ORAL_TABLET | Freq: Three times a day (TID) | ORAL | Status: DC | PRN

## 2012-09-16 MED ORDER — TRIAMTERENE-HCTZ 37.5-25 MG PO CAPS: 1.0000 | ORAL_CAPSULE | ORAL | Status: DC | PRN

## 2012-09-16 MED ORDER — MOMETASONE FUROATE 0.1 % EX CREA: TOPICAL_CREAM | Freq: Two times a day (BID) | CUTANEOUS | Status: DC | PRN

## 2012-09-16 MED ORDER — OMEPRAZOLE MAGNESIUM 20 MG PO TBEC: 20.0000 mg | DELAYED_RELEASE_TABLET | Freq: Two times a day (BID) | ORAL | Status: DC

## 2012-09-16 MED ORDER — ESCITALOPRAM OXALATE 20 MG PO TABS: 20.0000 mg | ORAL_TABLET | Freq: Every day | ORAL | Status: DC

## 2012-09-16 MED ORDER — MECLIZINE HCL 25 MG PO TABS: 25.0000 mg | ORAL_TABLET | ORAL | Status: DC | PRN

## 2012-09-16 MED ORDER — OLOPATADINE HCL 0.1 % OP SOLN: 1.0000 [drp] | Freq: Two times a day (BID) | OPHTHALMIC | Status: DC

## 2012-09-16 NOTE — Assessment & Plan Note (Signed)
Ongoing symptoms Lots of somatic fatigue, etc Will refer to psychiatry

## 2012-09-16 NOTE — Assessment & Plan Note (Signed)
Still working with PT though limited progress

## 2012-09-16 NOTE — Assessment & Plan Note (Signed)
BP Readings from Last 3 Encounters:  09/16/12 128/80  08/01/12 148/80  01/17/12 108/68   Good control No changes needed Followed by nephrology and urology for past hematuria

## 2012-09-16 NOTE — Progress Notes (Signed)
Subjective:    Patient ID: Alexandria Sherman, female    DOB: [birth year], 66 y.o.   MRN: [id number]  HPI Here for wellness visit and follow up Reviewed advanced directives Has had falls and balance issues---still working with PT Ongoing mood and fatigue issues Reviewed other doctors Some hearing problems, vision is okay Independent with all ADLs Mild memory issues--not really worrisome Hasn't been able to exercise much due to weakness and balance issues  Still depressed--relates to ongoing weakness, etc Some degree of anhedonia Occasionally thinks about dying but no suicidal ideation Still with anxiety---makes "worry lists"  No chest pain No SOB now Recent cold---wonders about getting an inhaler--as she had some SOB earlier  Able to sleep at night Uses the clorazepate nightly  Still on prilosec Uses daily and it controls her acid reflux Has failed attempts with the generic  Current Outpatient Prescriptions on File Prior to Visit  Medication Sig Dispense Refill  . Aspirin-Calcium Carbonate (BAYER WOMENS) 81-300 MG TABS Take by mouth daily.        Marland Kitchen buPROPion (WELLBUTRIN XL) 150 MG 24 hr tablet Take 1 tablet (150 mg total) by mouth daily.  90 tablet  3  . Calcium Carbonate (CALCIUM 500 PO) Take one by mouth twice a day       . cholecalciferol (VITAMIN D) 1000 UNITS tablet Take 1,000 Units by mouth daily.        Marland Kitchen escitalopram (LEXAPRO) 20 MG tablet Take 1 tablet (20 mg total) by mouth daily.  90 tablet  3  . latanoprost (XALATAN) 0.005 % ophthalmic solution Place 1 drop into both eyes at bedtime.       . methimazole (TAPAZOLE) 10 MG tablet Take 10 mg by mouth daily.      . Multiple Vitamin (MULTIVITAMIN) tablet Take over the counter as directed       . omeprazole (PRILOSEC OTC) 20 MG tablet Take 1 tablet (20 mg total) by mouth 2 (two) times daily. BRAND NAME ONLY  180 tablet  3  . triamterene-hydrochlorothiazide (DYAZIDE) 37.5-25 MG per capsule Take 1 each (1 capsule total) by  mouth as needed. For swelling  90 capsule  3    Allergies  Allergen Reactions  . Amoxicillin-Pot Clavulanate     REACTION: unspecified  . Cephalexin     REACTION: blisters  . Codeine Phosphate     REACTION: vomiting  . Promethazine Hcl     REACTION: nerves    Past Medical History  Diagnosis Date  . Depression 1978  . Diverticulosis of colon 04/2005  . GERD (gastroesophageal reflux disease)   . Sleep disturbance   . Impaired fasting glucose   . Hypertension   . Low TSH level     chronic low, but euthyroid  . Microscopic hematuria   . Tibia fracture 05/2010    left  . Hyperthyroidism     Dr Tedd Sias    Past Surgical History  Procedure Date  . Breast lumpectomy 1988    right  . Abdominal hysterectomy 1988    hysterectomy/ BSO for endometriosis  . Knee arthroscopy 6/12    left--Dr Charlann Boxer    Family History  Problem Relation Age of Onset  . Cancer Mother     lung  . Diabetes Mother   . Depression Mother   . Cancer Sister     oral cancer  . Depression Sister   . Depression Sister     History   Social History  . Marital Status: Divorced  Spouse Name: N/A    Number of Children: 0  . Years of Education: N/A   Occupational History  . 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 Smoking status: Never Smoker   . Smokeless tobacco: Never Used  . Alcohol Use: Yes     Comment: Occasional  . Drug Use: Not on file  . Sexually Active: Not on file   Other Topics Concern  . Not on file   Social History Narrative    No living willNo health care POA but requests both sisters to do this. Forms given 12/23/13Would accept resuscitationNo tube feeds if cognitively unaware   Review of Systems Appetite is okay Weight is stable Bowels are fine    Objective:   Physical Exam  Constitutional: She is oriented to person, place, and time. She appears well-developed and well-nourished. No distress.   Neck: Normal range of motion. Neck supple. No thyromegaly present.  Cardiovascular: Normal rate, regular rhythm, normal heart sounds and intact distal pulses.  Exam reveals no gallop.   No murmur heard. Pulmonary/Chest: Effort normal and breath sounds normal. No respiratory distress. She has no wheezes. She has no rales.  Abdominal: Soft. There is no tenderness.  Musculoskeletal: She exhibits no edema and no tenderness.  Lymphadenopathy:    She has no cervical adenopathy.  Neurological: She is alert and oriented to person, place, and time.       President--"Obama, Clinton"--- prompted for Bush [id number] (can't do math) D-[id number] (quickly) Recall 3/3  Psychiatric:       Chronic depression Appropriate affect          Assessment & Plan:

## 2012-09-16 NOTE — Assessment & Plan Note (Signed)
I have personally reviewed the Medicare Annual Wellness questionnaire and have noted 1. The patient's medical and social history 2. Their use of alcohol, tobacco or illicit drugs 3. Their current medications and supplements 4. The patient's functional ability including ADL's, fall risks, home safety risks and hearing or visual             impairment. 5. Diet and physical activities 6. Evidence for depression or mood disorders  The patients weight, height, BMI and visual acuity have been recorded in the chart I have made referrals, counseling and provided education to the patient based review of the above and I have provided the pt with a written personalized care plan for preventive services.  I have provided you with a copy of your personalized plan for preventive services. Please take the time to review along with your updated medication list.  Will change mammograms to every other year---due 2015 No other actions needed

## 2012-09-16 NOTE — Assessment & Plan Note (Signed)
Does okay with clorazapate

## 2012-09-25 ENCOUNTER — Encounter: Payer: Self-pay | Admitting: Internal Medicine

## 2012-10-04 ENCOUNTER — Telehealth: Payer: Self-pay | Admitting: Internal Medicine

## 2012-10-04 NOTE — Telephone Encounter (Signed)
I can write that she has had balance and walking issues and has been referred to PT but I don't know if that will qualify her for deferral  i would need to write specific letter recommending deferral of jury duty (but would probably have to mention mood issues as well)

## 2012-10-04 NOTE — Telephone Encounter (Signed)
.  left message to have patient return my call.  

## 2012-10-04 NOTE — Telephone Encounter (Signed)
Patient called asking if you would write on letterhead that you prescribed physical therapy for her and sign it.  She has been called for jury duty and wants to send the note since she can't walk well.  Patient is having problems with balance and falling.  Please call patient when note is ready.

## 2012-10-08 ENCOUNTER — Encounter: Payer: Self-pay | Admitting: Internal Medicine

## 2012-10-08 NOTE — Telephone Encounter (Addendum)
Called work number and was transferred to an extention which I needed her VM#, couldn't leave a message. Called left message on home number and on cell phone number.

## 2012-10-26 ENCOUNTER — Encounter: Payer: Self-pay | Admitting: Internal Medicine

## 2012-11-11 ENCOUNTER — Other Ambulatory Visit: Payer: Self-pay

## 2012-11-11 NOTE — Telephone Encounter (Signed)
Pt left v/m that she cannot wait for Prilosec OTC to be approved by faxing back and forth from [facility] to doctor's office; pt request [phone] [facility] called with case # [id number]. Pt needs name brand only. Pt wants [facility] to get the insurance co to agree to pay for med. Pt has new insurance thru [facility] and Edwena Bunde will deny or approve name brand med. Spoke with [facility] and they tried to give pt prescription version(capsules) of prilosec and pt said that did not work as well as Public affairs consultant. Pt request PA done for Prilosec OTC. [facility] has faxed prior auth for Prilosec OTC. Dee notified.

## 2012-11-12 NOTE — Telephone Encounter (Signed)
Dr.Letvak per Alexandria Sherman,  patient wants a prior auth done for Prilosec OTC and not the prescription Prilosec and her insurance will pay for the prescription Prilosec but not the OTC. Patient states the precription kind doesn't work only the OTC, pt was advised that she could buy Prilosec over the counter but she wants her insurance to pay. Form on your desk for prior auth.

## 2012-11-12 NOTE — Telephone Encounter (Signed)
Form faxed back and scanned 

## 2012-11-12 NOTE — Telephone Encounter (Signed)
Form done Not sure what they will do

## 2012-11-14 NOTE — Telephone Encounter (Signed)
.  left message to have patient return my call.  Prior Berkley Harvey was denied for Prilosec OTC

## 2013-02-21 ENCOUNTER — Other Ambulatory Visit: Payer: Self-pay | Admitting: *Deleted

## 2013-02-21 MED ORDER — CLORAZEPATE DIPOTASSIUM 7.5 MG PO TABS: 3.7500 mg | ORAL_TABLET | Freq: Three times a day (TID) | ORAL | Status: DC | PRN

## 2013-02-21 NOTE — Telephone Encounter (Signed)
Okay #90 x 0 

## 2013-02-21 NOTE — Telephone Encounter (Signed)
rx called into pharmacy

## 2013-02-21 NOTE — Telephone Encounter (Signed)
Last filled 10/14/12

## 2013-03-17 ENCOUNTER — Encounter: Payer: Self-pay | Admitting: Radiology

## 2013-03-18 ENCOUNTER — Encounter: Payer: Self-pay | Admitting: Internal Medicine

## 2013-03-18 ENCOUNTER — Ambulatory Visit (INDEPENDENT_AMBULATORY_CARE_PROVIDER_SITE_OTHER): Payer: Medicare Other | Admitting: Internal Medicine

## 2013-03-18 VITALS — BP 110/70 | HR 91 | Temp 98.0°F | Wt 205.0 lb

## 2013-03-18 DIAGNOSIS — F329 Major depressive disorder, single episode, unspecified: Secondary | ICD-10-CM

## 2013-03-18 DIAGNOSIS — R5383 Other fatigue: Secondary | ICD-10-CM

## 2013-03-18 DIAGNOSIS — R531 Weakness: Secondary | ICD-10-CM | POA: Insufficient documentation

## 2013-03-18 MED ORDER — CLORAZEPATE DIPOTASSIUM 3.75 MG PO TABS: 3.7500 mg | ORAL_TABLET | Freq: Three times a day (TID) | ORAL | Status: DC | PRN

## 2013-03-18 MED ORDER — FLUOXETINE HCL 20 MG PO TABS: 20.0000 mg | ORAL_TABLET | Freq: Every day | ORAL | Status: DC

## 2013-03-18 NOTE — Assessment & Plan Note (Signed)
Not clear if this is somatization Does have knee problems Okay for handicapped permit Needs neurology evaluation and this is scheduled---to be sure we are not missing other illness as cause of the weakness

## 2013-03-18 NOTE — Assessment & Plan Note (Signed)
Hasn't responded to multiple meds and has stopped all Best response in past to fluoxetine Will try this again  On clorazepate for sleep and anxiety Doesn't use it every day

## 2013-03-18 NOTE — Progress Notes (Signed)
Subjective:    Patient ID: Alexandria Sherman, female    DOB: 1945-11-02, 67 y.o.   MRN: [id number]  HPI Here for follow up Did got to Dr Imogene Burn and he tried various meds She wasn't satisfied and eventually stopped everything Very depressed and still anxious  Continues to have weakness and "something is wrong" Went to ENT and had testing which was negative Referred to Dr Malvin Johns for neurology evaluation---I told her that is what I was going to do  Reviewed her letter Bupropion and buspirone did not do any good Continues on clorazepate but usually tries 1/2 tab (needs new Rx) Really careful not to take this every day  Current Outpatient Prescriptions on File Prior to Visit  Medication Sig Dispense Refill  . Aspirin-Calcium Carbonate (BAYER WOMENS) 81-300 MG TABS Take by mouth daily.        . Calcium Carbonate (CALCIUM 500 PO) Take one by mouth twice a day       . cholecalciferol (VITAMIN D) 1000 UNITS tablet Take 1,000 Units by mouth daily.        . clorazepate (TRANXENE) 7.5 MG tablet Take 0.5-1 tablets (3.75-7.5 mg total) by mouth 3 (three) times daily as needed.  90 tablet  0  . latanoprost (XALATAN) 0.005 % ophthalmic solution Place 1 drop into both eyes at bedtime.       . meclizine (ANTIVERT) 25 MG tablet Take 1 tablet (25 mg total) by mouth as needed.  90 tablet  3  . methimazole (TAPAZOLE) 10 MG tablet Take 5 mg by mouth daily.       Marland Kitchen omeprazole (PRILOSEC OTC) 20 MG tablet Take 1 tablet (20 mg total) by mouth 2 (two) times daily. BRAND NAME ONLY  180 tablet  3   No current facility-administered medications on file prior to visit.    Allergies  Allergen Reactions  . Cephalexin     REACTION: blisters  . Codeine Phosphate     REACTION: vomiting  . Promethazine Hcl     REACTION: nerves    Past Medical History  Diagnosis Date  . Depression 1978  . Diverticulosis of colon 04/2005  . GERD (gastroesophageal reflux disease)   . Sleep disturbance   . Impaired fasting glucose    . Hypertension   . Low TSH level     chronic low, but euthyroid  . Microscopic hematuria   . Tibia fracture 05/2010    left  . Hyperthyroidism     Dr Tedd Sias    Past Surgical History  Procedure Laterality Date  . Breast lumpectomy  1988    right  . Abdominal hysterectomy  1988    hysterectomy/ BSO for endometriosis  . Knee arthroscopy  6/12    left--Dr Charlann Boxer    Family History  Problem Relation Age of Onset  . Cancer Mother     lung  . Diabetes Mother   . Depression Mother   . Cancer Sister     oral cancer  . Depression Sister   . Depression Sister     History   Social History  . Marital Status: Divorced    Spouse Name: N/A    Number of Children: 0  . Years of Education: N/A   Occupational History  . 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 Smoking status: Never Smoker   . Smokeless tobacco: Never Used  . Alcohol Use: Yes  Comment: Occasional  . Drug Use: Not on file  . Sexually Active: Not on file   Other Topics Concern  . Not on file   Social History Narrative    No living will   No health care POA but requests both sisters to do this.    Forms given 09/16/12   Would accept resuscitation   No tube feeds if cognitively unaware   Review of Systems Sleeps with clorazepate about 3 times per week Still works but off for the summer Appetite is good--tries to not focus on food (doesn't want to stress eat) Weight is stable Gets cramps in arms, feet and hands    Objective:   Physical Exam  Psychiatric:  Appropriate speech and dress Normal interaction          Assessment & Plan:

## 2013-04-02 DIAGNOSIS — N393 Stress incontinence (female) (male): Secondary | ICD-10-CM | POA: Insufficient documentation

## 2013-04-10 ENCOUNTER — Encounter: Payer: Self-pay | Admitting: Neurology

## 2013-04-14 ENCOUNTER — Encounter: Payer: Self-pay | Admitting: Radiology

## 2013-04-15 ENCOUNTER — Encounter: Payer: Self-pay | Admitting: Internal Medicine

## 2013-04-15 ENCOUNTER — Ambulatory Visit (INDEPENDENT_AMBULATORY_CARE_PROVIDER_SITE_OTHER): Payer: Medicare Other | Admitting: Internal Medicine

## 2013-04-15 VITALS — BP 120/80 | HR 88 | Temp 98.2°F | Wt 203.0 lb

## 2013-04-15 DIAGNOSIS — F329 Major depressive disorder, single episode, unspecified: Secondary | ICD-10-CM

## 2013-04-15 MED ORDER — FLUOXETINE HCL 20 MG PO TABS: 20.0000 mg | ORAL_TABLET | Freq: Every day | ORAL | Status: DC

## 2013-04-15 NOTE — Assessment & Plan Note (Signed)
Clearly better with the fluoxetine More animated Not focusing on the physical symptoms so much No apparent side effects Will continue

## 2013-04-15 NOTE — Progress Notes (Signed)
Subjective:    Patient ID: Alexandria Sherman, female    DOB: 06-Mar-1946, 67 y.o.   MRN: [id number]  HPI Feels better  Did see Dr Elinor Dodge note yet BW okay MRI scheduled He feels she may have neuropathy---?idiopathic Recent evaluation by Dr Achilles Dunk Has been walking with a crutch---feels it will prevent a fall if her knees buckle  On the fluoxetine No problems with this Mood is better  Current Outpatient Prescriptions on File Prior to Visit  Medication Sig Dispense Refill  . Aspirin-Calcium Carbonate (BAYER WOMENS) 81-300 MG TABS Take by mouth daily.        . Calcium Carbonate (CALCIUM 500 PO) Take one by mouth twice a day       . cholecalciferol (VITAMIN D) 1000 UNITS tablet Take 1,000 Units by mouth daily.        . clorazepate (TRANXENE) 3.75 MG tablet Take 1 tablet (3.75 mg total) by mouth 3 (three) times daily as needed for anxiety.  180 tablet  0  . FLUoxetine (PROZAC) 20 MG tablet Take 1 tablet (20 mg total) by mouth daily.  30 tablet  3  . latanoprost (XALATAN) 0.005 % ophthalmic solution Place 1 drop into both eyes at bedtime.       . meclizine (ANTIVERT) 25 MG tablet Take 1 tablet (25 mg total) by mouth as needed.  90 tablet  3  . methimazole (TAPAZOLE) 10 MG tablet Take 5 mg by mouth daily.       . mometasone (ELOCON) 0.1 % cream Apply 1 application topically 2 (two) times daily as needed (itching).      Marland Kitchen olopatadine (PATANOL) 0.1 % ophthalmic solution Place 1 drop into both eyes 2 (two) times daily as needed.      . triamterene-hydrochlorothiazide (DYAZIDE) 37.5-25 MG per capsule Take 1 capsule by mouth as needed (swelling).       No current facility-administered medications on file prior to visit.    Allergies  Allergen Reactions  . Cephalexin     REACTION: blisters  . Codeine Phosphate     REACTION: vomiting  . Promethazine Hcl     REACTION: nerves    Past Medical History  Diagnosis Date  . Depression 1978  . Diverticulosis of colon 04/2005  . GERD  (gastroesophageal reflux disease)   . Sleep disturbance   . Impaired fasting glucose   . Hypertension   . Low TSH level     chronic low, but euthyroid  . Microscopic hematuria   . Tibia fracture 05/2010    left  . Hyperthyroidism     Dr Tedd Sias    Past Surgical History  Procedure Laterality Date  . Breast lumpectomy  1988    right  . Abdominal hysterectomy  1988    hysterectomy/ BSO for endometriosis  . Knee arthroscopy  6/12    left--Dr Charlann Boxer    Family History  Problem Relation Age of Onset  . Cancer Mother     lung  . Diabetes Mother   . Depression Mother   . Cancer Sister     oral cancer  . Depression Sister   . Depression Sister     History   Social History  . Marital Status: Divorced    Spouse Name: N/A    Number of Children: 0  . Years of Education: N/A   Occupational History  . 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  Smoking status: Never Smoker   . Smokeless tobacco: Never Used  . Alcohol Use: Yes     Comment: Occasional  . Drug Use: Not on file  . Sexually Active: Not on file   Other Topics Concern  . Not on file   Social History Narrative    No living will   No health care POA but requests both sisters to do this.    Forms given 09/16/12   Would accept resuscitation   No tube feeds if cognitively unaware   Review of Systems Appetite is fine Weight stable Sleeps okay with the tranxene--doesn't take it every night Fairly rare during the day    Objective:   Physical Exam  Constitutional: She appears well-developed and well-nourished. No distress.  Psychiatric: She has a normal mood and affect. Her behavior is normal.  Bright and seems happier Appropriate affect          Assessment & Plan:

## 2013-04-17 ENCOUNTER — Ambulatory Visit: Payer: Self-pay | Admitting: Neurology

## 2013-04-25 ENCOUNTER — Encounter: Payer: Self-pay | Admitting: Internal Medicine

## 2013-04-25 ENCOUNTER — Encounter: Payer: Self-pay | Admitting: Neurology

## 2013-05-26 ENCOUNTER — Encounter: Payer: Self-pay | Admitting: Neurology

## 2013-06-25 ENCOUNTER — Encounter: Payer: Self-pay | Admitting: Neurology

## 2013-07-31 ENCOUNTER — Other Ambulatory Visit: Payer: Self-pay

## 2013-09-01 ENCOUNTER — Encounter: Payer: Self-pay | Admitting: Internal Medicine

## 2013-09-04 ENCOUNTER — Other Ambulatory Visit: Payer: Self-pay | Admitting: *Deleted

## 2013-09-04 MED ORDER — CLORAZEPATE DIPOTASSIUM 3.75 MG PO TABS: 3.7500 mg | ORAL_TABLET | Freq: Three times a day (TID) | ORAL | Status: DC | PRN

## 2013-09-04 NOTE — Telephone Encounter (Signed)
Last filled 03/18/13

## 2013-09-04 NOTE — Telephone Encounter (Signed)
rx called into pharmacy

## 2013-09-04 NOTE — Telephone Encounter (Signed)
Okay #180 x 0 

## 2013-09-11 ENCOUNTER — Ambulatory Visit: Payer: Self-pay | Admitting: Internal Medicine

## 2013-09-12 ENCOUNTER — Encounter: Payer: Self-pay | Admitting: Internal Medicine

## 2013-09-16 ENCOUNTER — Ambulatory Visit (INDEPENDENT_AMBULATORY_CARE_PROVIDER_SITE_OTHER): Payer: Medicare Other | Admitting: Internal Medicine

## 2013-09-16 ENCOUNTER — Encounter: Payer: Self-pay | Admitting: Internal Medicine

## 2013-09-16 VITALS — BP 128/88 | HR 96 | Temp 98.5°F | Ht 65.0 in | Wt 191.0 lb

## 2013-09-16 DIAGNOSIS — R3129 Other microscopic hematuria: Secondary | ICD-10-CM | POA: Insufficient documentation

## 2013-09-16 DIAGNOSIS — K219 Gastro-esophageal reflux disease without esophagitis: Secondary | ICD-10-CM

## 2013-09-16 DIAGNOSIS — F329 Major depressive disorder, single episode, unspecified: Secondary | ICD-10-CM

## 2013-09-16 DIAGNOSIS — Z0000 Encounter for general adult medical examination without abnormal findings: Secondary | ICD-10-CM

## 2013-09-16 DIAGNOSIS — I10 Essential (primary) hypertension: Secondary | ICD-10-CM

## 2013-09-16 DIAGNOSIS — R29818 Other symptoms and signs involving the nervous system: Secondary | ICD-10-CM

## 2013-09-16 DIAGNOSIS — E0590 Thyrotoxicosis, unspecified without thyrotoxic crisis or storm: Secondary | ICD-10-CM

## 2013-09-16 DIAGNOSIS — R2689 Other abnormalities of gait and mobility: Secondary | ICD-10-CM

## 2013-09-16 LAB — CBC WITH DIFFERENTIAL/PLATELET
Basophils Absolute: 0 10*3/uL (ref 0.0–0.1)
Basophils Relative: 0.6 % (ref 0.0–3.0)
Eosinophils Absolute: 0.1 10*3/uL (ref 0.0–0.7)
Hemoglobin: 13.3 g/dL (ref 12.0–15.0)
Lymphocytes Relative: 25 % (ref 12.0–46.0)
MCHC: 33.2 g/dL (ref 30.0–36.0)
MCV: 90.5 fl (ref 78.0–100.0)
Monocytes Absolute: 0.4 10*3/uL (ref 0.1–1.0)
Neutro Abs: 4.2 10*3/uL (ref 1.4–7.7)
Neutrophils Relative %: 66.1 % (ref 43.0–77.0)
RBC: 4.41 Mil/uL (ref 3.87–5.11)
RDW: 14.2 % (ref 11.5–14.6)
WBC: 6.4 10*3/uL (ref 4.5–10.5)

## 2013-09-16 LAB — BASIC METABOLIC PANEL
CO2: 28 mEq/L (ref 19–32)
Calcium: 9.4 mg/dL (ref 8.4–10.5)
Chloride: 103 mEq/L (ref 96–112)
Creatinine, Ser: 0.6 mg/dL (ref 0.4–1.2)
Glucose, Bld: 101 mg/dL — ABNORMAL HIGH (ref 70–99)

## 2013-09-16 LAB — HEPATIC FUNCTION PANEL
ALT: 17 U/L (ref 0–35)
Alkaline Phosphatase: 58 U/L (ref 39–117)
Bilirubin, Direct: 0.1 mg/dL (ref 0.0–0.3)
Total Protein: 7.3 g/dL (ref 6.0–8.3)

## 2013-09-16 LAB — T3, FREE: T3, Free: 3.5 pg/mL (ref 2.3–4.2)

## 2013-09-16 LAB — T4, FREE: Free T4: 0.87 ng/dL (ref 0.60–1.60)

## 2013-09-16 NOTE — Assessment & Plan Note (Signed)
Still no diagnosis Discussed needing to get going with exercise---in the pool

## 2013-09-16 NOTE — Assessment & Plan Note (Signed)
UTD with preventative care Needs to get into the pool for exercise

## 2013-09-16 NOTE — Assessment & Plan Note (Signed)
Quiet on the medication 

## 2013-09-16 NOTE — Assessment & Plan Note (Signed)
BP Readings from Last 3 Encounters:  09/16/13 128/88  04/15/13 120/80  03/18/13 110/70   BP is fine Just uses the diuretic prn when her legs swell some

## 2013-09-16 NOTE — Progress Notes (Signed)
Subjective:    Patient ID: Alexandria Sherman, female    DOB: 1945-10-20, 67 y.o.   MRN: [id number]  HPI Here for physical She "just wants 30 minutes"  Things "are the same" Had neuropathy and muscle tests from Dr Malvin Johns Then sent for cardiology work up--- Dr Darrold Junker didn't send results (echo and nuclear stress test fine) Did 3 sessions of PT----just using the cane regularly Wants handicapped permit again--still has balance problems and knees buckle  Still sees Dr Tedd Sias twice a year Neurologic problems predated the methimazole  Still has depression---but seems to be a reactive problem Stable on the fluoxetine  Current Outpatient Prescriptions on File Prior to Visit  Medication Sig Dispense Refill  . Aspirin-Calcium Carbonate (BAYER WOMENS) 81-300 MG TABS Take by mouth daily.        . Calcium Carbonate (CALCIUM 500 PO) Take one by mouth twice a day       . cholecalciferol (VITAMIN D) 1000 UNITS tablet Take 1,000 Units by mouth daily.        . clorazepate (TRANXENE) 3.75 MG tablet Take 1 tablet (3.75 mg total) by mouth 3 (three) times daily as needed for anxiety.  180 tablet  0  . FLUoxetine (PROZAC) 20 MG tablet Take 1 tablet (20 mg total) by mouth daily.  90 tablet  3  . latanoprost (XALATAN) 0.005 % ophthalmic solution Place 1 drop into both eyes at bedtime.       . meclizine (ANTIVERT) 25 MG tablet Take 1 tablet (25 mg total) by mouth as needed.  90 tablet  3  . methimazole (TAPAZOLE) 10 MG tablet Take 5 mg by mouth daily.       . mometasone (ELOCON) 0.1 % cream Apply 1 application topically 2 (two) times daily as needed (itching).      Marland Kitchen olopatadine (PATANOL) 0.1 % ophthalmic solution Place 1 drop into both eyes 2 (two) times daily as needed.      Marland Kitchen omeprazole (PRILOSEC) 20 MG capsule Take 20 mg by mouth 2 (two) times daily.       Marland Kitchen triamterene-hydrochlorothiazide (DYAZIDE) 37.5-25 MG per capsule Take 1 capsule by mouth as needed (swelling).       No current  facility-administered medications on file prior to visit.    Allergies  Allergen Reactions  . Cephalexin     REACTION: blisters  . Codeine Phosphate     REACTION: vomiting  . Promethazine Hcl     REACTION: nerves    Past Medical History  Diagnosis Date  . Depression 1978  . Diverticulosis of colon 04/2005  . GERD (gastroesophageal reflux disease)   . Sleep disturbance   . Impaired fasting glucose   . Hypertension   . Low TSH level     chronic low, but euthyroid  . Microscopic hematuria   . Tibia fracture 05/2010    left  . Hyperthyroidism     Dr Tedd Sias    Past Surgical History  Procedure Laterality Date  . Breast lumpectomy  1988    right  . Abdominal hysterectomy  1988    hysterectomy/ BSO for endometriosis  . Knee arthroscopy  6/12    left--Dr Charlann Boxer    Family History  Problem Relation Age of Onset  . Cancer Mother     lung  . Diabetes Mother   . Depression Mother   . Cancer Sister     oral cancer  . Depression Sister   . Depression Sister     History  Social History  . Marital Status: Divorced    Spouse Name: N/A    Number of Children: 0  . Years of Education: N/A   Occupational History  . 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 Smoking status: Never Smoker   . Smokeless tobacco: Never Used  . Alcohol Use: Yes     Comment: Occasional  . Drug Use: Not on file  . Sexual Activity: Not on file   Other Topics Concern  . Not on file   Social History Narrative   No living will   No health care POA but requests both sisters to do this.    Would accept resuscitation   No tube feeds if cognitively unaware   Review of Systems  Constitutional: Positive for unexpected weight change. Negative for fatigue.       Weight is down 12#--she is happy with this Wears seat belt  HENT: Positive for hearing loss. Negative for dental problem.        May need left hearing  aide Regular with dentist  Eyes: Negative for visual disturbance.       Early glaucoma and cataracts-- Dr Dingledein  Respiratory: Negative for cough, chest tightness and shortness of breath.   Cardiovascular: Positive for leg swelling. Negative for chest pain and palpitations.       Slight ankle swelling  Gastrointestinal: Negative for nausea, vomiting, abdominal pain, constipation and blood in stool.       Heartburn controlled with omeprazole  Endocrine: Positive for heat intolerance. Negative for cold intolerance.  Genitourinary: Positive for hematuria. Negative for dysuria and difficulty urinating.       Only microscopic hematuria  Musculoskeletal: Positive for arthralgias and myalgias. Negative for back pain and joint swelling.       Knee pain Occasional myalgia  Skin: Negative for rash.  Allergic/Immunologic: Negative for immunocompromised state.       Has been using benedryl rarely  Neurological: Positive for headaches. Negative for dizziness, syncope, weakness and light-headedness.       Occasional headaches  Hematological: Negative for adenopathy. Bruises/bleeds easily.  Psychiatric/Behavioral: Positive for dysphoric mood. Negative for sleep disturbance. The patient is nervous/anxious.        Sleeps well with the nightly tranxene Worried about her medical problems       Objective:   Physical Exam  Constitutional: She is oriented to person, place, and time. She appears well-developed and well-nourished. No distress.  HENT:  Head: Normocephalic and atraumatic.  Right Ear: External ear normal.  Left Ear: External ear normal.  Mouth/Throat: Oropharynx is clear and moist. No oropharyngeal exudate.  Eyes: Conjunctivae and EOM are normal. Pupils are equal, round, and reactive to light.  Neck: Normal range of motion. Neck supple. No thyromegaly present.  Cardiovascular: Normal rate, regular rhythm, normal heart sounds and intact distal pulses.  Exam reveals no gallop.   No  murmur heard. Pulmonary/Chest: Effort normal and breath sounds normal. No respiratory distress. She has no wheezes. She has no rales.  Abdominal: Soft. There is no tenderness.  Musculoskeletal: She exhibits no edema and no tenderness.  Lymphadenopathy:    She has no cervical adenopathy.  Neurological: She is alert and oriented to person, place, and time.  Skin: No rash noted. No erythema.  Psychiatric: She has a normal mood and affect. Her behavior is normal.          Assessment & Plan:

## 2013-09-16 NOTE — Assessment & Plan Note (Signed)
Chronic and benign Will just check for proteinuria

## 2013-09-16 NOTE — Assessment & Plan Note (Signed)
Subclinical I am still not sure she needs the methimazole Will recheck labs and send to Dr Tedd Sias

## 2013-09-16 NOTE — Assessment & Plan Note (Signed)
Still with reactive problems Will continue the med

## 2013-09-16 NOTE — Patient Instructions (Addendum)
Please start doing pool exercises--this is vital! You can continue to use the bike also. Please get copies of the heart tests for me.

## 2013-09-17 LAB — URINALYSIS, COMPLETE
Hgb urine dipstick: NEGATIVE
Leukocytes, UA: NEGATIVE
Nitrite: NEGATIVE
Protein, ur: NEGATIVE mg/dL
Specific Gravity, Urine: 1.024 (ref 1.005–1.030)
Squamous Epithelial / LPF: NONE SEEN
Urobilinogen, UA: 0.2 mg/dL (ref 0.0–1.0)

## 2013-10-08 ENCOUNTER — Encounter: Payer: Self-pay | Admitting: Internal Medicine

## 2013-10-09 ENCOUNTER — Encounter: Payer: Medicare Other | Admitting: Internal Medicine

## 2013-12-12 ENCOUNTER — Other Ambulatory Visit: Payer: Self-pay

## 2013-12-12 MED ORDER — CLORAZEPATE DIPOTASSIUM 3.75 MG PO TABS: 3.7500 mg | ORAL_TABLET | Freq: Three times a day (TID) | ORAL | Status: DC | PRN

## 2013-12-12 NOTE — Telephone Encounter (Signed)
Pt came to office to ck on status of tranxene refill; advised pt as instructed med called in this AM and this was first request we received;[facility] said rx ready for pick up. Pt notified rx ready for pick up. Pt wants to know how to avoid this happening again; pt said Tranxene is only refill has problem with; advised pt to call office choose opt 3 and I will help pt get tranxene refilled. Pt appreciative.

## 2013-12-12 NOTE — Telephone Encounter (Signed)
Pt left v/m; pt request status of tranxene refill; pt said Alexandria Sherman has been faxing refills x 3 since 12/10/13; [facility]30 am. Pt does not require cb.Please advise.

## 2013-12-12 NOTE — Telephone Encounter (Signed)
rx called into pharmacy

## 2013-12-12 NOTE — Telephone Encounter (Signed)
Okay #180 x 0 Let her know this is the first request we received

## 2014-03-17 ENCOUNTER — Encounter: Payer: Self-pay | Admitting: Internal Medicine

## 2014-03-17 ENCOUNTER — Ambulatory Visit (INDEPENDENT_AMBULATORY_CARE_PROVIDER_SITE_OTHER): Payer: Medicare Other | Admitting: Internal Medicine

## 2014-03-17 ENCOUNTER — Encounter: Payer: Self-pay | Admitting: Radiology

## 2014-03-17 VITALS — BP 128/80 | HR 80 | Temp 97.8°F | Wt 189.0 lb

## 2014-03-17 DIAGNOSIS — I10 Essential (primary) hypertension: Secondary | ICD-10-CM

## 2014-03-17 DIAGNOSIS — F39 Unspecified mood [affective] disorder: Secondary | ICD-10-CM

## 2014-03-17 DIAGNOSIS — R29818 Other symptoms and signs involving the nervous system: Secondary | ICD-10-CM

## 2014-03-17 DIAGNOSIS — R2689 Other abnormalities of gait and mobility: Secondary | ICD-10-CM

## 2014-03-17 NOTE — Progress Notes (Signed)
Subjective:    Patient ID: Alexandria Sherman, female    DOB: Jun 25, 1946, 68 y.o.   MRN: [id number]  HPI No visits to Dr Malvin JohnsPotter since March---will have 1 more visit Same issues with balance and still no diagnosis Does do bicycle and walking on indoor track Still falls about once a week--- falls backwards after standing up from kneeling,etc Uses the cane still Has to be very careful on steps  Mild knee issues still Weak but not overly painful  She feels her mood is some better Some reassurance after all her visits---that the balance problem doesn't represent something dangerous Still limits going out due to concerns about falling ect Come degree of anhedonia but no major depression Uses the tranxene every night---and occasionally in day Has had some problems getting Rx filled---will look into this  No chest pain No SOB No edema usually---occ mild in ankles  Current Outpatient Prescriptions on File Prior to Visit  Medication Sig Dispense Refill  . Aspirin-Calcium Carbonate (BAYER WOMENS) 81-300 MG TABS Take by mouth daily.        . Calcium Carbonate (CALCIUM 500 PO) Take one by mouth twice a day       . cholecalciferol (VITAMIN D) 1000 UNITS tablet Take 1,000 Units by mouth daily.        . clorazepate (TRANXENE) 3.75 MG tablet Take 1 tablet (3.75 mg total) by mouth 3 (three) times daily as needed for anxiety.  180 tablet  0  . FLUoxetine (PROZAC) 20 MG tablet Take 1 tablet (20 mg total) by mouth daily.  90 tablet  3  . latanoprost (XALATAN) 0.005 % ophthalmic solution Place 1 drop into both eyes at bedtime.       . meclizine (ANTIVERT) 25 MG tablet Take 1 tablet (25 mg total) by mouth as needed.  90 tablet  3  . mometasone (ELOCON) 0.1 % cream Apply 1 application topically 2 (two) times daily as needed (itching).      Marland Kitchen. olopatadine (PATANOL) 0.1 % ophthalmic solution Place 1 drop into both eyes 2 (two) times daily as needed.      Marland Kitchen. omeprazole (PRILOSEC) 20 MG capsule Take 20 mg by  mouth 2 (two) times daily.       Marland Kitchen. triamterene-hydrochlorothiazide (DYAZIDE) 37.5-25 MG per capsule Take 1 capsule by mouth as needed (swelling).       No current facility-administered medications on file prior to visit.    Allergies  Allergen Reactions  . Cephalexin     REACTION: blisters  . Codeine Phosphate     REACTION: vomiting  . Promethazine Hcl     REACTION: nerves    Past Medical History  Diagnosis Date  . Depression 1978  . Diverticulosis of colon 04/2005  . GERD (gastroesophageal reflux disease)   . Sleep disturbance   . Impaired fasting glucose   . Hypertension   . Low TSH level     chronic low, but euthyroid  . Microscopic hematuria   . Tibia fracture 05/2010    left  . Hyperthyroidism     Dr Tedd SiasSolum    Past Surgical History  Procedure Laterality Date  . Breast lumpectomy  1988    right  . Abdominal hysterectomy  1988    hysterectomy/ BSO for endometriosis  . Knee arthroscopy  6/12    left--Dr Charlann Boxerlin    Family History  Problem Relation Age of Onset  . Cancer Mother     lung  . Diabetes Mother   .  Depression Mother   . Cancer Sister     oral cancer  . Depression Sister   . Depression Sister     History   Social History  . Marital Status: Divorced    Spouse Name: N/A    Number of Children: 0  . Years of Education: N/A   Occupational History  . 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 Smoking status: Never Smoker   . Smokeless tobacco: Never Used  . Alcohol Use: Yes     Comment: Occasional  . Drug Use: Not on file  . Sexual Activity: Not on file   Other Topics Concern  . Not on file   Social History Narrative   No living will   No health care POA but requests both sisters to do this.    Would accept resuscitation   No tube feeds if cognitively unaware   Review of Systems Sleeps fine with med Appetite is fine Weight fairly stable    Objective:    Physical Exam  Constitutional: She appears well-developed and well-nourished. No distress.  Neck: Normal range of motion. Neck supple. No thyromegaly present.  Cardiovascular: Normal rate, regular rhythm and normal heart sounds.  Exam reveals no gallop.   No murmur heard. Pulmonary/Chest: Effort normal and breath sounds normal. No respiratory distress. She has no wheezes. She has no rales.  Musculoskeletal: She exhibits no edema and no tenderness.  Lymphadenopathy:    She has no cervical adenopathy.  Neurological:  Clear balance issues still---has to hold on with any position changes, etc No focal weakness  Psychiatric: She has a normal mood and affect. Her behavior is normal.          Assessment & Plan:

## 2014-03-17 NOTE — Patient Instructions (Signed)
Please work on leg strengthening-- quadriceps and hamstrings. Consider Tai Chi exercises to help your balance.

## 2014-03-17 NOTE — Assessment & Plan Note (Signed)
Persists but no clear diagnosis Discussed strength and balance training

## 2014-03-17 NOTE — Progress Notes (Signed)
Pre visit review using our clinic review tool, if applicable. No additional management support is needed unless otherwise documented below in the visit note. 

## 2014-03-17 NOTE — Assessment & Plan Note (Signed)
Reactive depression is stable Ongoing anxiety that clorazepate helps (mostly for sleep)

## 2014-03-17 NOTE — Assessment & Plan Note (Signed)
BP Readings from Last 3 Encounters:  03/17/14 128/80  09/16/13 128/88  04/15/13 120/80   Good control still

## 2014-03-18 ENCOUNTER — Telehealth: Payer: Self-pay | Admitting: Internal Medicine

## 2014-03-18 NOTE — Telephone Encounter (Signed)
Relevant patient education assigned to patient using Emmi. ° °

## 2014-03-25 ENCOUNTER — Other Ambulatory Visit: Payer: Self-pay | Admitting: Internal Medicine

## 2014-03-25 NOTE — Telephone Encounter (Signed)
Okay #180 x 0 

## 2014-03-25 NOTE — Telephone Encounter (Signed)
rx called into pharmacy

## 2014-03-25 NOTE — Telephone Encounter (Signed)
12/12/13 

## 2014-04-21 ENCOUNTER — Other Ambulatory Visit: Payer: Self-pay | Admitting: Internal Medicine

## 2014-05-04 ENCOUNTER — Other Ambulatory Visit: Payer: Self-pay | Admitting: Internal Medicine

## 2014-06-22 ENCOUNTER — Other Ambulatory Visit: Payer: Self-pay | Admitting: Internal Medicine

## 2014-06-22 NOTE — Telephone Encounter (Signed)
Last office visit 03/17/2014.  Last refilled 03/25/2014 for #180 with no refills.  Ok to refill?

## 2014-06-23 NOTE — Telephone Encounter (Signed)
Pt left vm requesting refill of Tranxene done before lunch today; pt would ck with pharmacy at lunch time. Refill done.

## 2014-06-23 NOTE — Telephone Encounter (Signed)
Okay #180 x 0 

## 2014-07-28 DIAGNOSIS — R2689 Other abnormalities of gait and mobility: Secondary | ICD-10-CM | POA: Insufficient documentation

## 2014-07-28 DIAGNOSIS — F419 Anxiety disorder, unspecified: Secondary | ICD-10-CM | POA: Insufficient documentation

## 2014-07-28 DIAGNOSIS — E611 Iron deficiency: Secondary | ICD-10-CM | POA: Insufficient documentation

## 2014-07-29 ENCOUNTER — Telehealth: Payer: Self-pay | Admitting: Internal Medicine

## 2014-07-29 NOTE — Telephone Encounter (Signed)
Pt called in stating that she received a call from [facility] stating that they wanted to schedule her medicare wellness for an earlier date. I did not see any phone notes regarding this. Advised patient of her appt date and time 10/13/14 @ 8am.

## 2014-09-07 ENCOUNTER — Other Ambulatory Visit: Payer: Self-pay | Admitting: Internal Medicine

## 2014-09-07 NOTE — Telephone Encounter (Signed)
rx called into pharmacy

## 2014-09-07 NOTE — Telephone Encounter (Signed)
Approved:  #180 x 0 

## 2014-09-07 NOTE — Telephone Encounter (Signed)
06/23/14

## 2014-09-14 ENCOUNTER — Ambulatory Visit: Payer: Self-pay | Admitting: Internal Medicine

## 2014-09-14 ENCOUNTER — Encounter: Payer: Self-pay | Admitting: Internal Medicine

## 2014-10-12 ENCOUNTER — Encounter: Payer: Medicare Other | Admitting: Internal Medicine

## 2014-10-13 ENCOUNTER — Encounter: Payer: Self-pay | Admitting: Internal Medicine

## 2014-10-13 ENCOUNTER — Ambulatory Visit (INDEPENDENT_AMBULATORY_CARE_PROVIDER_SITE_OTHER): Payer: Medicare Other | Admitting: Internal Medicine

## 2014-10-13 VITALS — BP 128/80 | HR 103 | Temp 98.1°F | Ht 65.0 in | Wt 176.0 lb

## 2014-10-13 DIAGNOSIS — R2689 Other abnormalities of gait and mobility: Secondary | ICD-10-CM

## 2014-10-13 DIAGNOSIS — E0590 Thyrotoxicosis, unspecified without thyrotoxic crisis or storm: Secondary | ICD-10-CM

## 2014-10-13 DIAGNOSIS — R3129 Other microscopic hematuria: Secondary | ICD-10-CM

## 2014-10-13 DIAGNOSIS — Z0000 Encounter for general adult medical examination without abnormal findings: Secondary | ICD-10-CM

## 2014-10-13 DIAGNOSIS — F39 Unspecified mood [affective] disorder: Secondary | ICD-10-CM

## 2014-10-13 DIAGNOSIS — R312 Other microscopic hematuria: Secondary | ICD-10-CM

## 2014-10-13 DIAGNOSIS — Z23 Encounter for immunization: Secondary | ICD-10-CM

## 2014-10-13 DIAGNOSIS — R29818 Other symptoms and signs involving the nervous system: Secondary | ICD-10-CM

## 2014-10-13 DIAGNOSIS — Z1211 Encounter for screening for malignant neoplasm of colon: Secondary | ICD-10-CM

## 2014-10-13 DIAGNOSIS — K219 Gastro-esophageal reflux disease without esophagitis: Secondary | ICD-10-CM

## 2014-10-13 DIAGNOSIS — Z7189 Other specified counseling: Secondary | ICD-10-CM

## 2014-10-13 DIAGNOSIS — I10 Essential (primary) hypertension: Secondary | ICD-10-CM

## 2014-10-13 LAB — CBC WITH DIFFERENTIAL/PLATELET
BASOS ABS: 0 10*3/uL (ref 0.0–0.1)
Basophils Relative: 0.5 % (ref 0.0–3.0)
EOS ABS: 0.1 10*3/uL (ref 0.0–0.7)
Eosinophils Relative: 1.5 % (ref 0.0–5.0)
HCT: 45.5 % (ref 36.0–46.0)
Hemoglobin: 14.9 g/dL (ref 12.0–15.0)
Lymphocytes Relative: 25.2 % (ref 12.0–46.0)
Lymphs Abs: 1.8 10*3/uL (ref 0.7–4.0)
MCHC: 32.8 g/dL (ref 30.0–36.0)
MCV: 93.8 fl (ref 78.0–100.0)
MONOS PCT: 5.9 % (ref 3.0–12.0)
Monocytes Absolute: 0.4 10*3/uL (ref 0.1–1.0)
Neutro Abs: 4.8 10*3/uL (ref 1.4–7.7)
Neutrophils Relative %: 66.9 % (ref 43.0–77.0)
PLATELETS: 308 10*3/uL (ref 150.0–400.0)
RBC: 4.85 Mil/uL (ref 3.87–5.11)
RDW: 13.3 % (ref 11.5–15.5)
WBC: 7.2 10*3/uL (ref 4.0–10.5)

## 2014-10-13 LAB — URINALYSIS
BILIRUBIN URINE: NEGATIVE
Hgb urine dipstick: NEGATIVE
Ketones, ur: NEGATIVE
LEUKOCYTES UA: NEGATIVE
Nitrite: NEGATIVE
PH: 5.5 (ref 5.0–8.0)
SPECIFIC GRAVITY, URINE: 1.025 (ref 1.000–1.030)
TOTAL PROTEIN, URINE-UPE24: NEGATIVE
UROBILINOGEN UA: 0.2 (ref 0.0–1.0)
Urine Glucose: NEGATIVE

## 2014-10-13 LAB — COMPREHENSIVE METABOLIC PANEL
ALT: 14 U/L (ref 0–35)
AST: 20 U/L (ref 0–37)
Albumin: 4.3 g/dL (ref 3.5–5.2)
Alkaline Phosphatase: 59 U/L (ref 39–117)
BILIRUBIN TOTAL: 0.4 mg/dL (ref 0.2–1.2)
BUN: 21 mg/dL (ref 6–23)
CALCIUM: 10.6 mg/dL — AB (ref 8.4–10.5)
CO2: 29 meq/L (ref 19–32)
Chloride: 100 mEq/L (ref 96–112)
Creatinine, Ser: 0.7 mg/dL (ref 0.40–1.20)
GFR: 87.93 mL/min (ref >60.00)
GLUCOSE: 110 mg/dL — AB (ref 70–99)
POTASSIUM: 4.4 meq/L (ref 3.5–5.1)
Sodium: 137 mEq/L (ref 135–145)
TOTAL PROTEIN: 7.8 g/dL (ref 6.0–8.3)

## 2014-10-13 LAB — MAGNESIUM: MAGNESIUM: 2.2 mg/dL (ref 1.5–2.5)

## 2014-10-13 LAB — T4, FREE: Free T4: 0.86 ng/dL (ref 0.60–1.60)

## 2014-10-13 LAB — TSH: TSH: 1.71 u[IU]/mL (ref 0.35–4.50)

## 2014-10-13 MED ORDER — MOMETASONE FUROATE 0.1 % EX CREA: 1.0000 "application " | TOPICAL_CREAM | Freq: Two times a day (BID) | CUTANEOUS | Status: DC | PRN

## 2014-10-13 MED ORDER — TRIAMTERENE-HCTZ 37.5-25 MG PO CAPS: 1.0000 | ORAL_CAPSULE | ORAL | Status: DC | PRN

## 2014-10-13 MED ORDER — METHIMAZOLE 5 MG PO TABS: 2.5000 mg | ORAL_TABLET | Freq: Every day | ORAL | Status: DC

## 2014-10-13 MED ORDER — MECLIZINE HCL 25 MG PO TABS: 25.0000 mg | ORAL_TABLET | ORAL | Status: DC | PRN

## 2014-10-13 NOTE — Assessment & Plan Note (Signed)
BP Readings from Last 3 Encounters:  10/13/14 128/80  03/17/14 128/80  09/16/13 128/88   Remains low Continues the HCTZ only prn for edema

## 2014-10-13 NOTE — Progress Notes (Signed)
Pre visit review using our clinic review tool, if applicable. No additional management support is needed unless otherwise documented below in the visit note. 

## 2014-10-13 NOTE — Assessment & Plan Note (Signed)
Fairly quiet on the PPI 

## 2014-10-13 NOTE — Assessment & Plan Note (Signed)
See social history Forms given 

## 2014-10-13 NOTE — Assessment & Plan Note (Signed)
Subclinical Will recheck labs on lower methimazole dose

## 2014-10-13 NOTE — Addendum Note (Signed)
Addended by: Sueanne MargaritaSMITH, Emmilynn Marut L on: 10/13/2014 09:07 AM   Modules accepted: Orders

## 2014-10-13 NOTE — Assessment & Plan Note (Signed)
I have personally reviewed the Medicare Annual Wellness questionnaire and have noted 1. The patient's medical and social history 2. Their use of alcohol, tobacco or illicit drugs 3. Their current medications and supplements 4. The patient's functional ability including ADL's, fall risks, home safety risks and hearing or visual             impairment. 5. Diet and physical activities 6. Evidence for depression or mood disorders  The patients weight, height, BMI and visual acuity have been recorded in the chart I have made referrals, counseling and provided education to the patient based review of the above and I have provided the pt with a written personalized care plan for preventive services.  I have provided you with a copy of your personalized plan for preventive services. Please take the time to review along with your updated medication list.  Due for prevnar Recent mammo-- due 12/17. Prefers no breast exam after discussion  Needs colon cancer screening--will do immunoassay after discussion Tries to exercise

## 2014-10-13 NOTE — Assessment & Plan Note (Signed)
Ongoing balance issues but fortunately no signs of worrisome neurologic disorder

## 2014-10-13 NOTE — Progress Notes (Signed)
Subjective:    Patient ID: Alexandria Sherman, female    DOB: Jan 30, 1946, 69 y.o.   MRN: [id number]  HPI Here for Medicare wellness and follow up of multiple chronic medical problems Reviewed form and advanced directives Does try to exercise regularly to keep strength up Will have a rare drink--like if visiting sister No tobacco Still drives and independent with instrumental ADLs Reviewed other physicians No surgery or hospital stays in past year Multiple falls No cognitive issues of great concern  Still with balance issues Frequent falls Uses crutch and rollator at work ?neuropathy in feet per Dr Malvin Johns  Wants me to take over the treatment for hyperthyroidism Dr Tedd Sias did just decrease the methimazole to 2.5mg  daily She seems to be euthyroid  Still having muscle cramps Taking supplemental magnesium---feels her legs may be some better but not her hands  Continues on the tranxene--takes it every night Will rarely take it during day if her nerves are bad Frequent feelings of sadness-- but no change Still working-- plans to continue for at least a 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 Aspirin-Calcium Carbonate (BAYER WOMENS) 81-300 MG TABS Take by mouth daily.      . Calcium Carbonate (CALCIUM 500 PO) Take one by mouth twice a day     . cholecalciferol (VITAMIN D) 1000 UNITS tablet Take 1,000 Units by mouth daily.      . clorazepate (TRANXENE) 3.75 MG tablet TAKE ONE (1) TABLET THREE (3) TIMES EACH DAY 180 tablet 0  . ferrous sulfate 325 (65 FE) MG tablet PATIENT NOT SURE OF DOSE    . FLUoxetine (PROZAC) 20 MG capsule TAKE ONE (1)  CAPSULE EACH DAY 90 capsule 3  . latanoprost (XALATAN) 0.005 % ophthalmic solution Place 1 drop into both eyes at bedtime.     . meclizine (ANTIVERT) 25 MG tablet Take 1 tablet (25 mg total) by mouth as needed. 90 tablet 3  . mometasone (ELOCON) 0.1 % cream Apply 1 application topically 2 (two) times daily as needed (itching).    Marland Kitchen olopatadine (PATANOL) 0.1 % ophthalmic solution Place 1 drop into both eyes 2 (two) times daily as needed.    Marland Kitchen omeprazole (PRILOSEC) 20 MG capsule TAKE ONE TABLET TWICE DAILY 180 capsule 3  . triamterene-hydrochlorothiazide (DYAZIDE) 37.5-25 MG per capsule Take 1 capsule by mouth as needed (swelling).     No current facility-administered medications on file prior to visit.    Allergies  Allergen Reactions  . Cephalexin     REACTION: blisters  . Codeine Phosphate     REACTION: vomiting  . Promethazine Hcl     REACTION: nerves    Past Medical History  Diagnosis Date  . Depression 1978  . Diverticulosis of colon 04/2005  . GERD (gastroesophageal reflux disease)   . Sleep disturbance   . Impaired fasting glucose   . Hypertension   . Low TSH level     chronic low, but euthyroid  . Microscopic hematuria   . Tibia fracture 05/2010    left  . Hyperthyroidism     Dr Tedd Sias    Past Surgical History  Procedure Laterality Date  . Breast  lumpectomy  1988    right  . Abdominal hysterectomy  1988    hysterectomy/ BSO for endometriosis  . Knee arthroscopy  6/12    left--Dr Charlann Boxerlin    Family History  Problem Relation Age of Onset  . Cancer Mother     lung  . Diabetes Mother   . Depression Mother   . Cancer Sister     oral cancer  . Depression Sister   . Depression Sister     History   Social History  . Marital Status: Divorced    Spouse Name: N/A    Number of Children: 0  . Years of Education: N/A   Occupational History  . 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  Smoking status: Never Smoker   . Smokeless tobacco: Never Used  . Alcohol Use: Yes     Comment: Occasional  . Drug Use: Not on file  . Sexual Activity: Not on file   Other Topics Concern  . Not on file   Social History Narrative   No living will   No health care POA but requests both sisters to do this.    Would accept resuscitation   No tube feeds if cognitively unaware   Review of Systems  Sleeps well with the tranxene Appetite is fine Has cut back on eating---weight down 13# Bowels are fine--not excited about colonoscopy     Objective:   Physical Exam  Constitutional: She is oriented to person, place, and time. She appears well-developed and well-nourished. No distress.  HENT:  Mouth/Throat: Oropharynx is clear and moist. No oropharyngeal exudate.  Neck: Normal range of motion. Neck supple. No thyromegaly present.  Cardiovascular: Normal rate, regular rhythm, normal heart sounds and intact distal pulses.  Exam reveals no gallop.   No murmur heard. Pulmonary/Chest: Effort normal and breath sounds normal. No respiratory distress. She has no wheezes. She has no rales.  Abdominal: Soft. There is no tenderness.  Musculoskeletal: She exhibits no edema or tenderness.  Lymphadenopathy:    She has no cervical adenopathy.  Neurological: She is alert and oriented to person, place, and time.  President -- "Obama, Milas KocherBush, Clinton, Bush, Reagan" [phone] D-l-r-o-w Recall 3/3  Skin: No rash noted. No erythema.  Psychiatric: She has a normal mood and affect. Her behavior is normal.          Assessment & Plan:

## 2014-10-13 NOTE — Addendum Note (Signed)
Addended by: Sueanne MargaritaSMITH, Eann Cleland L on: 10/13/2014 09:05 AM   Modules accepted: Orders

## 2014-10-13 NOTE — Assessment & Plan Note (Signed)
Will just recheck urinalysis

## 2014-10-13 NOTE — Assessment & Plan Note (Signed)
Dysthymia and anxiety Remains on her meds

## 2014-10-21 ENCOUNTER — Telehealth: Payer: Self-pay

## 2014-10-21 NOTE — Telephone Encounter (Signed)
Pt left v/m requesting printed lab results for 10/13/14 including u/a. Left v/m requesting pt to cb need to verify pts address or [facility]

## 2014-10-22 NOTE — Telephone Encounter (Signed)
Pt couldn't get lab results from mychart; pt notified as instructed from result note and pt voiced understanding. Pt requested copy of 10/13/14 lab results mailed to pt at verified pts address. Advised pt done.

## 2014-10-25 ENCOUNTER — Encounter: Payer: Self-pay | Admitting: Internal Medicine

## 2014-10-26 ENCOUNTER — Other Ambulatory Visit (INDEPENDENT_AMBULATORY_CARE_PROVIDER_SITE_OTHER): Payer: Medicare Other

## 2014-10-26 DIAGNOSIS — Z1211 Encounter for screening for malignant neoplasm of colon: Secondary | ICD-10-CM

## 2014-10-26 LAB — FECAL OCCULT BLOOD, IMMUNOCHEMICAL: FECAL OCCULT BLD: NEGATIVE

## 2014-12-01 ENCOUNTER — Other Ambulatory Visit: Payer: Self-pay | Admitting: Internal Medicine

## 2014-12-01 NOTE — Telephone Encounter (Signed)
plz phone in. 

## 2014-12-01 NOTE — Telephone Encounter (Signed)
Received refill request electronically from pharmacy. Last refill 09/07/14 #180, last office visit 10/13/14. Is it okay to refill medication?

## 2014-12-02 NOTE — Telephone Encounter (Signed)
Rx called in to pharmacy. 

## 2015-02-08 ENCOUNTER — Other Ambulatory Visit: Payer: Self-pay | Admitting: Family Medicine

## 2015-02-09 NOTE — Telephone Encounter (Signed)
rx called into pharmacy

## 2015-02-09 NOTE — Telephone Encounter (Signed)
Approved: okay #[id number] x 0 Please change the sig to tid prn

## 2015-04-28 ENCOUNTER — Other Ambulatory Visit: Payer: Self-pay | Admitting: Internal Medicine

## 2015-04-28 NOTE — Telephone Encounter (Signed)
rx called into pharmacy

## 2015-04-28 NOTE — Telephone Encounter (Signed)
02/09/15

## 2015-04-28 NOTE — Telephone Encounter (Signed)
Approved: okay #[id number] x 0 

## 2015-05-06 ENCOUNTER — Other Ambulatory Visit: Payer: Self-pay | Admitting: Internal Medicine

## 2015-06-10 ENCOUNTER — Encounter: Payer: Self-pay | Admitting: Internal Medicine

## 2015-07-15 ENCOUNTER — Other Ambulatory Visit: Payer: Self-pay | Admitting: Internal Medicine

## 2015-07-15 NOTE — Telephone Encounter (Signed)
04/28/2015

## 2015-07-15 NOTE — Telephone Encounter (Signed)
If it is going to a local pharmacy, this is still about a week or 10 days early

## 2015-07-15 NOTE — Telephone Encounter (Signed)
Refill denied and sent back to pharmacy, also spoke with pharmacy and advised results

## 2015-07-19 ENCOUNTER — Other Ambulatory Visit: Payer: Self-pay | Admitting: Internal Medicine

## 2015-07-19 ENCOUNTER — Encounter: Payer: Self-pay | Admitting: Internal Medicine

## 2015-07-19 MED ORDER — CLORAZEPATE DIPOTASSIUM 3.75 MG PO TABS: ORAL_TABLET | ORAL | Status: DC

## 2015-07-19 NOTE — Telephone Encounter (Signed)
Pt states that she was told she could take 2 clorazepate (TRANXENE) 3.75 MG tablet each night and occasionally take 2 during the day for anxiety.  She would like to get the refill this week.  Pt requesting callback at [phone].

## 2015-07-19 NOTE — Telephone Encounter (Signed)
Please send Rx for her clorazepate #180 x 0 1 tid prn

## 2015-07-19 NOTE — Telephone Encounter (Signed)
See other phone note

## 2015-07-19 NOTE — Telephone Encounter (Signed)
Pt called, disappointed that refill had not been authorized for the

## 2015-07-19 NOTE — Telephone Encounter (Signed)
Please see refill request, she's asking to take more, please advise

## 2015-07-19 NOTE — Telephone Encounter (Signed)
rx called into pharmacy

## 2015-09-29 ENCOUNTER — Other Ambulatory Visit: Payer: Self-pay | Admitting: Internal Medicine

## 2015-09-29 DIAGNOSIS — Z1231 Encounter for screening mammogram for malignant neoplasm of breast: Secondary | ICD-10-CM

## 2015-10-04 ENCOUNTER — Ambulatory Visit
Admission: RE | Admit: 2015-10-04 | Discharge: 2015-10-04 | Disposition: A | Discharge status: Home / Self Care | Payer: Medicare Other | Source: Ambulatory Visit | Attending: Internal Medicine | Admitting: Internal Medicine

## 2015-10-04 ENCOUNTER — Other Ambulatory Visit: Payer: Self-pay | Admitting: Internal Medicine

## 2015-10-04 DIAGNOSIS — Z1231 Encounter for screening mammogram for malignant neoplasm of breast: Secondary | ICD-10-CM | POA: Diagnosis not present

## 2015-10-05 ENCOUNTER — Ambulatory Visit
Admission: RE | Admit: 2015-10-05 | Discharge: 2015-10-05 | Disposition: A | Discharge status: Home / Self Care | Payer: Medicare Other | Source: Ambulatory Visit | Attending: Internal Medicine | Admitting: Internal Medicine

## 2015-10-05 ENCOUNTER — Other Ambulatory Visit: Payer: Self-pay | Admitting: Internal Medicine

## 2015-10-05 DIAGNOSIS — R928 Other abnormal and inconclusive findings on diagnostic imaging of breast: Secondary | ICD-10-CM

## 2015-10-06 ENCOUNTER — Other Ambulatory Visit: Payer: Medicare Other

## 2015-10-06 ENCOUNTER — Ambulatory Visit: Payer: Medicare Other

## 2015-10-18 ENCOUNTER — Ambulatory Visit (INDEPENDENT_AMBULATORY_CARE_PROVIDER_SITE_OTHER): Payer: Medicare Other | Admitting: Internal Medicine

## 2015-10-18 ENCOUNTER — Other Ambulatory Visit: Payer: Self-pay | Admitting: Internal Medicine

## 2015-10-18 ENCOUNTER — Encounter: Payer: Self-pay | Admitting: Internal Medicine

## 2015-10-18 VITALS — BP 130/80 | HR 107 | Temp 98.2°F | Ht 65.0 in | Wt 176.0 lb

## 2015-10-18 DIAGNOSIS — E0590 Thyrotoxicosis, unspecified without thyrotoxic crisis or storm: Secondary | ICD-10-CM

## 2015-10-18 DIAGNOSIS — F39 Unspecified mood [affective] disorder: Secondary | ICD-10-CM | POA: Diagnosis not present

## 2015-10-18 DIAGNOSIS — K219 Gastro-esophageal reflux disease without esophagitis: Secondary | ICD-10-CM

## 2015-10-18 DIAGNOSIS — R3129 Other microscopic hematuria: Secondary | ICD-10-CM

## 2015-10-18 DIAGNOSIS — Z1211 Encounter for screening for malignant neoplasm of colon: Secondary | ICD-10-CM

## 2015-10-18 DIAGNOSIS — G629 Polyneuropathy, unspecified: Secondary | ICD-10-CM | POA: Diagnosis not present

## 2015-10-18 DIAGNOSIS — Z0000 Encounter for general adult medical examination without abnormal findings: Secondary | ICD-10-CM | POA: Diagnosis not present

## 2015-10-18 DIAGNOSIS — Z7189 Other specified counseling: Secondary | ICD-10-CM

## 2015-10-18 DIAGNOSIS — I10 Essential (primary) hypertension: Secondary | ICD-10-CM

## 2015-10-18 LAB — URINALYSIS, ROUTINE W REFLEX MICROSCOPIC
Bilirubin Urine: NEGATIVE
Ketones, ur: NEGATIVE
SPECIFIC GRAVITY, URINE: 1.01 (ref 1.000–1.030)
Total Protein, Urine: NEGATIVE
URINE GLUCOSE: NEGATIVE
Urobilinogen, UA: 0.2 — AB (ref 0.0–1.0)
pH: 5.5 (ref 5.0–8.0)

## 2015-10-18 LAB — CBC WITH DIFFERENTIAL/PLATELET
BASOS ABS: 0 10*3/uL (ref 0.0–0.1)
Basophils Relative: 0.7 % (ref 0.0–3.0)
Eosinophils Absolute: 0.1 10*3/uL (ref 0.0–0.7)
Eosinophils Relative: 1 % (ref 0.0–5.0)
HCT: 44.2 % (ref 36.0–46.0)
HEMOGLOBIN: 14.4 g/dL (ref 12.0–15.0)
LYMPHS ABS: 2 10*3/uL (ref 0.7–4.0)
Lymphocytes Relative: 28.2 % (ref 12.0–46.0)
MCHC: 32.5 g/dL (ref 30.0–36.0)
MCV: 92.3 fl (ref 78.0–100.0)
MONO ABS: 0.5 10*3/uL (ref 0.1–1.0)
MONOS PCT: 7.5 % (ref 3.0–12.0)
NEUTROS PCT: 62.6 % (ref 43.0–77.0)
Neutro Abs: 4.5 10*3/uL (ref 1.4–7.7)
Platelets: 329 10*3/uL (ref 150.0–400.0)
RBC: 4.78 Mil/uL (ref 3.87–5.11)
RDW: 13.9 % (ref 11.5–15.5)
WBC: 7.1 10*3/uL (ref 4.0–10.5)

## 2015-10-18 LAB — COMPREHENSIVE METABOLIC PANEL
ALK PHOS: 59 U/L (ref 39–117)
ALT: 15 U/L (ref 0–35)
AST: 22 U/L (ref 0–37)
Albumin: 4.3 g/dL (ref 3.5–5.2)
BILIRUBIN TOTAL: 0.4 mg/dL (ref 0.2–1.2)
BUN: 16 mg/dL (ref 6–23)
CALCIUM: 10 mg/dL (ref 8.4–10.5)
CHLORIDE: 101 meq/L (ref 96–112)
CO2: 26 mEq/L (ref 19–32)
Creatinine, Ser: 0.57 mg/dL (ref 0.40–1.20)
GFR: 111.14 mL/min (ref >60.00)
GLUCOSE: 100 mg/dL — AB (ref 70–99)
Potassium: 4.1 mEq/L (ref 3.5–5.1)
SODIUM: 136 meq/L (ref 135–145)
Total Protein: 7.7 g/dL (ref 6.0–8.3)

## 2015-10-18 LAB — TSH: TSH: 1.49 u[IU]/mL (ref 0.35–4.50)

## 2015-10-18 LAB — T4, FREE: Free T4: 0.88 ng/dL (ref 0.60–1.60)

## 2015-10-18 MED ORDER — CLORAZEPATE DIPOTASSIUM 3.75 MG PO TABS: ORAL_TABLET | ORAL | Status: DC

## 2015-10-18 NOTE — Assessment & Plan Note (Signed)
Doing well on her meds Will try to cut back on the clorazepate

## 2015-10-18 NOTE — Assessment & Plan Note (Signed)
Stable on low dose methimazole

## 2015-10-18 NOTE — Progress Notes (Signed)
Pre visit review using our clinic review tool, if applicable. No additional management support is needed unless otherwise documented below in the visit note. 

## 2015-10-18 NOTE — Assessment & Plan Note (Signed)
BP Readings from Last 3 Encounters:  10/18/15 130/80  10/13/14 128/80  03/17/14 128/80   Good control

## 2015-10-18 NOTE — Assessment & Plan Note (Signed)
Seems to be the reason for her balance issues--though she is unclear Discussed strengthening her legs

## 2015-10-18 NOTE — Progress Notes (Signed)
Subjective:    Patient ID: Alexandria Sherman, female    DOB: 11-Apr-1946, 70 y.o.   MRN: [id number]  HPI Here for Medicare wellness and follow up of chronic medical conditions Reviewed form and advanced directives Reviewed other doctors No alcohol or tobacco Does try to exercise a little--Silver sneakers at times. Considering a personal trainer Vision is fine despite glaucoma. Early cataracts  Hearing is not good--- not bad enough for aides Independent with instrumental ADLs No major memory issues  Ongoing problems with balance Neuropathy in feet diagnosed-- but also has some degree of weakness Feet are not painful Probably 3-4 falls in past year---doing better now with crutch or rollator  Same dose of methimazole Seems to be euthyroid No change in energy levels, hair, skin or nails  No chest pain No SOB No dizziness or syncope Has occasional end of day edema Still works part time as Mining engineer  Mood is good Still affected by her limitations --balance, etc Takes the tranxene at night and then 1 occasionally in day (when school is on her nerves) No regular depression or anhedonia  Current Outpatient Prescriptions on File Prior to Visit  Medication Sig Dispense Refill  . Aspirin-Calcium Carbonate (BAYER WOMENS) 81-300 MG TABS Take by mouth daily.      . Calcium Carbonate (CALCIUM 500 PO) Take one by mouth twice a day     . cholecalciferol (VITAMIN D) 1000 UNITS tablet Take 1,000 Units by mouth daily.      . clorazepate (TRANXENE) 3.75 MG tablet TAKE ONE (1) TABLET THREE (3) TIMES EACH DAY 180 tablet 0  . ferrous sulfate 325 (65 FE) MG tablet PATIENT NOT SURE OF DOSE    . FLUoxetine (PROZAC) 20 MG capsule TAKE ONE (1) CAPSULE EACH DAY 90 capsule 3  . latanoprost (XALATAN) 0.005 % ophthalmic solution Place 1 drop into both eyes at bedtime.     . meclizine (ANTIVERT) 25 MG tablet Take 1 tablet (25 mg total) by mouth as needed. 90 tablet 3  . methimazole (TAPAZOLE) 5 MG tablet  Take 0.5 tablets (2.5 mg total) by mouth daily. 90 tablet 3  . mometasone (ELOCON) 0.1 % cream Apply 1 application topically 2 (two) times daily as needed (itching). 45 g 0  . olopatadine (PATANOL) 0.1 % ophthalmic solution Place 1 drop into both eyes 2 (two) times daily as needed.    . triamterene-hydrochlorothiazide (DYAZIDE) 37.5-25 MG per capsule Take 1 each (1 capsule total) by mouth as needed (swelling). 90 capsule 3   No current facility-administered medications on file prior to visit.    Allergies  Allergen Reactions  . Cephalexin     REACTION: blisters  . Codeine Phosphate     REACTION: vomiting  . Promethazine Hcl     REACTION: nerves    Past Medical History  Diagnosis Date  . Depression 1978  . Diverticulosis of colon 04/2005  . GERD (gastroesophageal reflux disease)   . Sleep disturbance   . Impaired fasting glucose   . Hypertension   . Low TSH level     chronic low, but euthyroid  . Microscopic hematuria   . Tibia fracture 05/2010    left  . Hyperthyroidism     Dr Tedd Sias    Past Surgical History  Procedure Laterality Date  . Breast lumpectomy  1988    right  . Abdominal hysterectomy  1988    hysterectomy/ BSO for endometriosis  . Knee arthroscopy  6/12    left--Dr Charlann Boxer  .  Breast biopsy Right 1992    neg    Family History  Problem Relation Age of Onset  . Cancer Mother     lung  . Diabetes Mother   . Depression Mother   . Cancer Sister     oral cancer  . Depression Sister   . Depression Sister     Social History   Social History  . Marital Status: Divorced    Spouse Name: N/A  . Number of Children: 0  . Years of Education: N/A   Occupational History  . 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 Smoking status: Never Smoker   . Smokeless tobacco: Never Used  . Alcohol Use: Yes     Comment: Occasional  . Drug Use: Not on file  . Sexual Activity: Not on file    Other Topics Concern  . Not on file   Social History Narrative   No living will   No health care POA but requests both sisters to do this.    Would accept resuscitation but no prolonged ventilation   No tube feeds if cognitively unaware   Review of Systems  Appetite is fine Weight stable Sleeps well with clorazepate Wears seat belt Teeth fine--keeps up with dentist Bowels are fine Mild urinary incontinence--- tries to keep bladder empty. Hasn't needed pad No rash or suspicious skin lesions Knees weak and will buckle--is going back to ortho     Objective:   Physical Exam  Constitutional: She is oriented to person, place, and time. She appears well-developed and well-nourished. No distress.  HENT:  Mouth/Throat: Oropharynx is clear and moist. No oropharyngeal exudate.  Neck: Normal range of motion. Neck supple. No thyromegaly present.  Cardiovascular: Normal rate, regular rhythm, normal heart sounds and intact distal pulses.  Exam reveals no gallop.   No murmur heard. Pulmonary/Chest: Effort normal and breath sounds normal. No respiratory distress. She has no wheezes. She has no rales.  Abdominal: Soft. There is no tenderness.  Musculoskeletal: She exhibits no edema or tenderness.  Lymphadenopathy:    She has no cervical adenopathy.  Neurological: She is alert and oriented to person, place, and time.  President-- "Benson [place], Bush" [phone] D-l-r-o-w Recall 3/3  Skin: No rash noted. No erythema.  Psychiatric: She has a normal mood and affect. Her behavior is normal.          Assessment & Plan:

## 2015-10-18 NOTE — Assessment & Plan Note (Signed)
Will recheck urinalysis No longer goes to urologist

## 2015-10-18 NOTE — Assessment & Plan Note (Signed)
Urged her to do advanced directives 

## 2015-10-18 NOTE — Assessment & Plan Note (Signed)
Has cut down to one a day recently Symptoms controlled No swallowing problems

## 2015-10-18 NOTE — Assessment & Plan Note (Signed)
I have personally reviewed the Medicare Annual Wellness questionnaire and have noted 1. The patient's medical and social history 2. Their use of alcohol, tobacco or illicit drugs 3. Their current medications and supplements 4. The patient's functional ability including ADL's, fall risks, home safety risks and hearing or visual             impairment. 5. Diet and physical activities 6. Evidence for depression or mood disorders  The patients weight, height, BMI and visual acuity have been recorded in the chart I have made referrals, counseling and provided education to the patient based review of the above and I have provided the pt with a written personalized care plan for preventive services.  I have provided you with a copy of your personalized plan for preventive services. Please take the time to review along with your updated medication list.  UTD on imms Will do immunoassay Just had mammo--due again 1/19 Discussed more exercise

## 2015-10-19 LAB — HEPATITIS C ANTIBODY: HCV AB: NEGATIVE

## 2015-10-28 ENCOUNTER — Other Ambulatory Visit (INDEPENDENT_AMBULATORY_CARE_PROVIDER_SITE_OTHER): Payer: Medicare Other

## 2015-10-28 DIAGNOSIS — Z1211 Encounter for screening for malignant neoplasm of colon: Secondary | ICD-10-CM | POA: Diagnosis not present

## 2015-10-29 LAB — FECAL OCCULT BLOOD, IMMUNOCHEMICAL: Fecal Occult Bld: NEGATIVE

## 2015-11-10 ENCOUNTER — Other Ambulatory Visit: Payer: Self-pay | Admitting: Internal Medicine

## 2015-12-01 ENCOUNTER — Other Ambulatory Visit: Payer: Self-pay | Admitting: Internal Medicine

## 2016-05-02 ENCOUNTER — Other Ambulatory Visit: Payer: Self-pay | Admitting: Internal Medicine

## 2016-05-05 ENCOUNTER — Other Ambulatory Visit: Payer: Self-pay

## 2016-05-05 MED ORDER — METHIMAZOLE 5 MG PO TABS: 2.5000 mg | ORAL_TABLET | Freq: Every day | ORAL | 1 refills | Status: DC

## 2016-05-05 MED ORDER — CLORAZEPATE DIPOTASSIUM 3.75 MG PO TABS: ORAL_TABLET | ORAL | 0 refills | Status: DC

## 2016-05-05 NOTE — Telephone Encounter (Signed)
[facility]. Methimazole sent electronically

## 2016-05-05 NOTE — Telephone Encounter (Signed)
Approved: #[phone] x 0 for the clorazepate

## 2016-05-05 NOTE — Telephone Encounter (Signed)
Last filled 10-18-15 #270 Last OV 10-18-15 Next OV 10-20-16

## 2016-05-17 ENCOUNTER — Encounter: Payer: Self-pay | Admitting: Internal Medicine

## 2016-05-17 NOTE — Telephone Encounter (Signed)
I have placed a DNR Form in Dr Karle StarchLetvak's InBox on his desk

## 2016-05-17 NOTE — Telephone Encounter (Signed)
Please copy for scanning and let her know the form is ready for pick up

## 2016-08-26 ENCOUNTER — Other Ambulatory Visit: Payer: Self-pay | Admitting: Internal Medicine

## 2016-10-20 ENCOUNTER — Encounter: Payer: Self-pay | Admitting: Internal Medicine

## 2016-10-20 ENCOUNTER — Ambulatory Visit (INDEPENDENT_AMBULATORY_CARE_PROVIDER_SITE_OTHER): Payer: Medicare Other | Admitting: Internal Medicine

## 2016-10-20 VITALS — BP 134/90 | HR 92 | Temp 98.2°F | Ht 63.0 in | Wt 180.0 lb

## 2016-10-20 DIAGNOSIS — Z7189 Other specified counseling: Secondary | ICD-10-CM

## 2016-10-20 DIAGNOSIS — I10 Essential (primary) hypertension: Secondary | ICD-10-CM | POA: Diagnosis not present

## 2016-10-20 DIAGNOSIS — E0590 Thyrotoxicosis, unspecified without thyrotoxic crisis or storm: Secondary | ICD-10-CM | POA: Diagnosis not present

## 2016-10-20 DIAGNOSIS — G629 Polyneuropathy, unspecified: Secondary | ICD-10-CM | POA: Diagnosis not present

## 2016-10-20 DIAGNOSIS — Z0000 Encounter for general adult medical examination without abnormal findings: Secondary | ICD-10-CM | POA: Diagnosis not present

## 2016-10-20 DIAGNOSIS — R3129 Other microscopic hematuria: Secondary | ICD-10-CM

## 2016-10-20 DIAGNOSIS — Z1211 Encounter for screening for malignant neoplasm of colon: Secondary | ICD-10-CM

## 2016-10-20 DIAGNOSIS — F39 Unspecified mood [affective] disorder: Secondary | ICD-10-CM | POA: Diagnosis not present

## 2016-10-20 LAB — COMPREHENSIVE METABOLIC PANEL
ALT: 15 U/L (ref 0–35)
AST: 19 U/L (ref 0–37)
Albumin: 4.4 g/dL (ref 3.5–5.2)
Alkaline Phosphatase: 54 U/L (ref 39–117)
BILIRUBIN TOTAL: 0.4 mg/dL (ref 0.2–1.2)
BUN: 21 mg/dL (ref 6–23)
CALCIUM: 10.4 mg/dL (ref 8.4–10.5)
CO2: 32 meq/L (ref 19–32)
CREATININE: 0.6 mg/dL (ref 0.40–1.20)
Chloride: 100 mEq/L (ref 96–112)
GFR: 104.45 mL/min (ref >60.00)
Glucose, Bld: 100 mg/dL — ABNORMAL HIGH (ref 70–99)
Potassium: 4.5 mEq/L (ref 3.5–5.1)
Sodium: 138 mEq/L (ref 135–145)
Total Protein: 7.8 g/dL (ref 6.0–8.3)

## 2016-10-20 LAB — CBC WITH DIFFERENTIAL/PLATELET
BASOS ABS: 0 10*3/uL (ref 0.0–0.1)
Basophils Relative: 0.7 % (ref 0.0–3.0)
EOS ABS: 0.1 10*3/uL (ref 0.0–0.7)
Eosinophils Relative: 1.6 % (ref 0.0–5.0)
HCT: 41 % (ref 36.0–46.0)
Hemoglobin: 13.9 g/dL (ref 12.0–15.0)
LYMPHS ABS: 1.7 10*3/uL (ref 0.7–4.0)
Lymphocytes Relative: 26.9 % (ref 12.0–46.0)
MCHC: 34 g/dL (ref 30.0–36.0)
MCV: 91.2 fl (ref 78.0–100.0)
MONO ABS: 0.4 10*3/uL (ref 0.1–1.0)
Monocytes Relative: 6.1 % (ref 3.0–12.0)
NEUTROS ABS: 4.1 10*3/uL (ref 1.4–7.7)
NEUTROS PCT: 64.7 % (ref 43.0–77.0)
PLATELETS: 293 10*3/uL (ref 150.0–400.0)
RBC: 4.49 Mil/uL (ref 3.87–5.11)
RDW: 13 % (ref 11.5–15.5)
WBC: 6.3 10*3/uL (ref 4.0–10.5)

## 2016-10-20 LAB — LIPID PANEL
Cholesterol: 196 mg/dL (ref 0–200)
HDL: 76.8 mg/dL (ref >39.00)
LDL Cholesterol: 101 mg/dL — ABNORMAL HIGH (ref 0–99)
NonHDL: 119.28
TRIGLYCERIDES: 89 mg/dL (ref 0.0–149.0)
Total CHOL/HDL Ratio: 3
VLDL: 17.8 mg/dL (ref 0.0–40.0)

## 2016-10-20 LAB — T4, FREE: Free T4: 0.71 ng/dL (ref 0.60–1.60)

## 2016-10-20 LAB — TSH: TSH: 1.14 u[IU]/mL (ref 0.35–4.50)

## 2016-10-20 NOTE — Progress Notes (Signed)
Pre visit review using our clinic review tool, if applicable. No additional management support is needed unless otherwise documented below in the visit note. 

## 2016-10-20 NOTE — Assessment & Plan Note (Signed)
Mild without pain Affects her balance mostly

## 2016-10-20 NOTE — Assessment & Plan Note (Signed)
Borderline--likely doesn't need meds Will recheck but stop the methimazole and then recheck in 3 months

## 2016-10-20 NOTE — Assessment & Plan Note (Signed)
I have personally reviewed the Medicare Annual Wellness questionnaire and have noted 1. The patient's medical and social history 2. Their use of alcohol, tobacco or illicit drugs 3. Their current medications and supplements 4. The patient's functional ability including ADL's, fall risks, home safety risks and hearing or visual             impairment. 5. Diet and physical activities 6. Evidence for depression or mood disorders  The patients weight, height, BMI and visual acuity have been recorded in the chart I have made referrals, counseling and provided education to the patient based review of the above and I have provided the pt with a written personalized care plan for preventive services.  I have provided you with a copy of your personalized plan for preventive services. Please take the time to review along with your updated medication list.  Will check FIT Mammogram due 1/19 Has started some exercise Pap not indicated

## 2016-10-20 NOTE — Patient Instructions (Addendum)
Please stop the methimazole and come back in 3 months to recheck the thyroid tests.  DASH Eating Plan DASH stands for "Dietary Approaches to Stop Hypertension." The DASH eating plan is a healthy eating plan that has been shown to reduce high blood pressure (hypertension). Additional health benefits may include reducing the risk of type 2 diabetes mellitus, heart disease, and stroke. The DASH eating plan may also help with weight loss. What do I need to know about the DASH eating plan? For the DASH eating plan, you will follow these general guidelines:  Choose foods with less than 150 milligrams of sodium per serving (as listed on the food label).  Use salt-free seasonings or herbs instead of table salt or sea salt.  Check with your health care provider or pharmacist before using salt substitutes.  Eat lower-sodium products. These are often labeled as "low-sodium" or "no salt added."  Eat fresh foods. Avoid eating a lot of canned foods.  Eat more vegetables, fruits, and low-fat dairy products.  Choose whole grains. Look for the word "whole" as the first word in the ingredient list.  Choose fish and skinless chicken or Mal[place]key more often than red meat. Limit fish, poultry, and meat to 6 oz (170 g) each day.  Limit sweets, desserts, sugars, and sugary drinks.  Choose heart-healthy fats.  Eat more home-cooked food and less restaurant, buffet, and fast food.  Limit fried foods.  Do not fry foods. Cook foods using methods such as baking, boiling, grilling, and broiling instead.  When eating at a restaurant, ask that your food be prepared with less salt, or no salt if possible. What foods can I eat? Seek help from a dietitian for individual calorie needs. Grains  Whole grain or whole wheat bread. Brown rice. Whole grain or whole wheat pasta. Quinoa, bulgur, and whole grain cereals. Low-sodium cereals. Corn or whole wheat flour tortillas. Whole grain cornbread. Whole grain crackers.  Low-sodium crackers. Vegetables  Fresh or frozen vegetables (raw, steamed, roasted, or grilled). Low-sodium or reduced-sodium tomato and vegetable juices. Low-sodium or reduced-sodium tomato sauce and paste. Low-sodium or reduced-sodium canned vegetables. Fruits  All fresh, canned (in natural juice), or frozen fruits. Meat and Other Protein Products  Ground beef (85% or leaner), grass-fed beef, or beef trimmed of fat. Skinless chicken or [place]key. Ground chicken or [place]key. Pork trimmed of fat. All fish and seafood. Eggs. Dried beans, peas, or lentils. Unsalted nuts and seeds. Unsalted canned beans. Dairy  Low-fat dairy products, such as skim or 1% milk, 2% or reduced-fat cheeses, low-fat ricotta or cottage cheese, or plain low-fat yogurt. Low-sodium or reduced-sodium cheeses. Fats and Oils  Tub margarines without trans fats. Light or reduced-fat mayonnaise and salad dressings (reduced sodium). Avocado. Safflower, olive, or canola oils. Natural peanut or almond butter. Other  Unsalted popcorn and pretzels. The items listed above may not be a complete list of recommended foods or beverages. Contact your dietitian for more options.  What foods are not recommended? Grains  White bread. White pasta. White rice. Refined cornbread. Bagels and croissants. Crackers that contain trans fat. Vegetables  Creamed or fried vegetables. Vegetables in a cheese sauce. Regular canned vegetables. Regular canned tomato sauce and paste. Regular tomato and vegetable juices. Fruits  Canned fruit in light or heavy syrup. Fruit juice. Meat and Other Protein Products  Fatty cuts of meat. Ribs, chicken wings, bacon, sausage, bologna, salami, chitterlings, fatback, hot dogs, bratwurst, and packaged luncheon meats. Salted nuts and seeds. Canned beans with salt. Dairy  Whole or 2% milk, cream, half-and-half, and cream cheese. Whole-fat or sweetened yogurt. Full-fat cheeses or blue cheese. Nondairy creamers and whipped  toppings. Processed cheese, cheese spreads, or cheese curds. Condiments  Onion and garlic salt, seasoned salt, table salt, and sea salt. Canned and packaged gravies. Worcestershire sauce. Tartar sauce. Barbecue sauce. Teriyaki sauce. Soy sauce, including reduced sodium. Steak sauce. Fish sauce. Oyster sauce. Cocktail sauce. Horseradish. Ketchup and mustard. Meat flavorings and tenderizers. Bouillon cubes. Hot sauce. Tabasco sauce. Marinades. Taco seasonings. Relishes. Fats and Oils  Butter, stick margarine, lard, shortening, ghee, and bacon fat. Coconut, palm kernel, or palm oils. Regular salad dressings. Other  Pickles and olives. Salted popcorn and pretzels. The items listed above may not be a complete list of foods and beverages to avoid. Contact your dietitian for more information.  Where can I find more information? National Heart, Lung, and Blood Institute: travelstabloid.com This information is not intended to replace advice given to you by your health care provider. Make sure you discuss any questions you have with your health care provider. Document Released: 08/31/2011 Document Revised: 02/17/2016 Document Reviewed: 07/16/2013 Elsevier Interactive Patient Education  2017 [employer].

## 2016-10-20 NOTE — Assessment & Plan Note (Signed)
BP Readings from Last 3 Encounters:  10/20/16 134/90  10/18/15 130/80  10/13/14 128/80   Control okay without regular meds

## 2016-10-20 NOTE — Assessment & Plan Note (Signed)
Chronic anxiety and dysthymia No changes needed

## 2016-10-20 NOTE — Assessment & Plan Note (Signed)
Has DNR now

## 2016-10-20 NOTE — Progress Notes (Signed)
Subjective:    Patient ID: Alexandria Sherman, female    DOB: October 19, 1945, 71 y.o.   MRN: [id number]  HPI Here for Medicare wellness visit and follow up of chronic health conditions Reviewed form and advanced directives Reviewed other doctors No alcohol or tobacco Tries to exercise occasionally--has started water aerobics Vision okay--glaucoma and early cataracts Ongoing poor hearing--Alexandria Sherman is not ready for aides Has fallen about 4 times this year---multiple injuries with these (like exacerbating knee injury) Independent with instrumental ADLs--but has given up the yard work Mild memory changes--but nothing particularly worrisome  Still has some problems with balance No pain in legs---and doesn't feel they are particularly numb Finds it best to use crutches to prevent calls  Has had sciatica for about 6 months--right side Uses some motrin Better lately  Still has her chronic mild anxiety No sig depression--controlled by the fluoxetine Did try getting off the clorazepate---didn't sleep for 3 weeks. Went back on it--just at bedtime  No gross hematuria No longer seeing urologist  No chest pain or SOB No dizziness or syncope No palpitations No sig edema Only uses the dyazide occasionally  Same methimazole dose No apparent thyroid issues  Current Outpatient Prescriptions on File Prior to Visit  Medication Sig Dispense Refill  . Aspirin-Calcium Carbonate (BAYER WOMENS) 81-300 MG TABS Take by mouth daily.      . cholecalciferol (VITAMIN D) 1000 UNITS tablet Take 2,000 Units by mouth daily.     . clorazepate (TRANXENE) 3.75 MG tablet TAKE ONE (1) TABLET THREE (3) TIMES EACH DAY 270 tablet 0  . FLUoxetine (PROZAC) 20 MG capsule TAKE ONE (1) CAPSULE EACH DAY 90 capsule 1  . latanoprost (XALATAN) 0.005 % ophthalmic solution Place 1 drop into both eyes at bedtime.     . meclizine (ANTIVERT) 25 MG tablet TAKE ONE TABLET BY MOUTH AS NEEDED 90 tablet 0  . methimazole (TAPAZOLE) 5 MG  tablet Take 2.5 mg by mouth daily.     . methimazole (TAPAZOLE) 5 MG tablet Take 0.5 tablets (2.5 mg total) by mouth daily. 90 tablet 1  . mometasone (ELOCON) 0.1 % cream Apply 1 application topically 2 (two) times daily as needed (itching). 45 g 0  . olopatadine (PATANOL) 0.1 % ophthalmic solution Place 1 drop into both eyes 2 (two) times daily as needed.    Marland Kitchen omeprazole (PRILOSEC) 20 MG capsule TAKE ONE (1) CAPSULE BY MOUTH 2 TIMES DAILY 180 capsule 0  . triamterene-hydrochlorothiazide (DYAZIDE) 37.5-25 MG per capsule Take 1 each (1 capsule total) by mouth as needed (swelling). 90 capsule 3   No current facility-administered medications on file prior to visit.     Allergies  Allergen Reactions  . Cephalexin     REACTION: vaginal blisters Tolerates penicillins  . Codeine Phosphate     REACTION: vomiting  . Promethazine Hcl     REACTION: nerves    Past Medical History:  Diagnosis Date  . Depression 1978  . Diverticulosis of colon 04/2005  . GERD (gastroesophageal reflux disease)   . Hypertension   . Hyperthyroidism    Dr Tedd Sias  . Impaired fasting glucose   . Low TSH level    chronic low, but euthyroid  . Microscopic hematuria   . Sleep disturbance   . Tibia fracture 05/2010   left    Past Surgical History:  Procedure Laterality Date  . ABDOMINAL HYSTERECTOMY  1988   hysterectomy/ BSO for endometriosis  . BREAST BIOPSY Right 1992   neg  .  BREAST LUMPECTOMY  1988   right  . KNEE ARTHROSCOPY  6/12   left--Dr Charlann Boxerlin    Family History  Problem Relation Age of Onset  . Cancer Mother     lung  . Diabetes Mother   . Depression Mother   . Cancer Sister     oral cancer  . Depression Sister   . Depression Sister     Social History   Social History  . Marital status: Divorced    Spouse name: N/A  . Number of children: 0  . Years of education: N/A   Occupational History  . Teacher--retired    Social History Main Topics  . Smoking status: Never Smoker  .  Smokeless tobacco: Never Used  . Alcohol use Yes     Comment: Occasional  . Drug use: Unknown  . Sexual activity: Not on file   Other Topics Concern  . Not on file   Social History Narrative   No living will   No health care POA but requests both sisters to do this.    Would accept resuscitation but no prolonged ventilation   No tube feeds if cognitively unaware   Review of Systems Appetite is fine Weight is stable Sleeps fair with the tranxene Awakens with nocturia x 2-3. Ongoing incontinence-- -wears pad Wears seat belt Teeth are fine---keeps up with dentist Bowels are fine. No blood in stool No rash or suspicious lesions. Gets some itching in antecubital fossae    Objective:   Physical Exam  Constitutional: Alexandria Sherman is oriented to person, place, and time. Alexandria Sherman appears well-nourished. No distress.  HENT:  Mouth/Throat: Oropharynx is clear and moist. No oropharyngeal exudate.  Neck: Normal range of motion. Neck supple. No thyromegaly present.  Cardiovascular: Normal rate, regular rhythm, normal heart sounds and intact distal pulses.  Exam reveals no gallop.   No murmur heard. Pulmonary/Chest: Effort normal and breath sounds normal. No respiratory distress. Alexandria Sherman has no wheezes. Alexandria Sherman has no rales.  Abdominal: Soft. There is no tenderness.  Musculoskeletal: Alexandria Sherman exhibits no edema or tenderness.  Lymphadenopathy:    Alexandria Sherman has no cervical adenopathy.  Neurological: Alexandria Sherman is alert and oriented to person, place, and time.  President-- "Benson [place]rump, Obama, Bush" [id number] Recall 3/3  Weakness --trouble getting up on table  Skin: No rash noted. No erythema.  Psychiatric: Alexandria Sherman has a normal mood and affect. Her behavior is normal.          Assessment & Plan:

## 2016-10-20 NOTE — Assessment & Plan Note (Signed)
Will recheck again

## 2016-10-21 LAB — URINALYSIS, COMPLETE
BACTERIA UA: NONE SEEN [HPF]
BILIRUBIN URINE: NEGATIVE
Casts: NONE SEEN [LPF]
Crystals: NONE SEEN [HPF]
GLUCOSE, UA: NEGATIVE
KETONES UR: NEGATIVE
LEUKOCYTES UA: NEGATIVE
Nitrite: NEGATIVE
PROTEIN: NEGATIVE
SQUAMOUS EPITHELIAL / LPF: NONE SEEN [HPF] (ref <=5)
Specific Gravity, Urine: 1.018 (ref 1.001–1.035)
YEAST: NONE SEEN [HPF]
pH: 6 (ref 5.0–8.0)

## 2016-10-22 ENCOUNTER — Encounter: Payer: Self-pay | Admitting: Internal Medicine

## 2016-10-25 ENCOUNTER — Other Ambulatory Visit (INDEPENDENT_AMBULATORY_CARE_PROVIDER_SITE_OTHER): Payer: Medicare Other

## 2016-10-25 DIAGNOSIS — Z1211 Encounter for screening for malignant neoplasm of colon: Secondary | ICD-10-CM

## 2016-10-25 LAB — FECAL OCCULT BLOOD, IMMUNOCHEMICAL: Fecal Occult Bld: NEGATIVE

## 2016-11-06 ENCOUNTER — Encounter: Payer: Self-pay | Admitting: Internal Medicine

## 2016-11-06 MED ORDER — CLORAZEPATE DIPOTASSIUM 3.75 MG PO TABS: ORAL_TABLET | ORAL | 0 refills | Status: DC

## 2016-11-06 MED ORDER — FLUOXETINE HCL 20 MG PO CAPS: ORAL_CAPSULE | ORAL | 3 refills | Status: DC

## 2016-11-06 NOTE — Telephone Encounter (Signed)
Px written for call in   1 refill in pcp absence

## 2016-11-06 NOTE — Telephone Encounter (Signed)
Last filled 05-05-16 #270 Last OV 10-20-16 Next OV 10-22-17  Forwarding to Dr Milinda Antisower in Dr Karle StarchLetvak's absence

## 2016-11-07 NOTE — Telephone Encounter (Signed)
Rx called in as prescribed 

## 2017-01-09 ENCOUNTER — Encounter: Payer: Self-pay | Admitting: Internal Medicine

## 2017-01-12 ENCOUNTER — Other Ambulatory Visit: Payer: Self-pay | Admitting: Internal Medicine

## 2017-01-12 MED ORDER — MECLIZINE HCL 25 MG PO TABS: 25.0000 mg | ORAL_TABLET | ORAL | 0 refills | Status: DC | PRN

## 2017-01-12 NOTE — Addendum Note (Signed)
Addended by: Eual Fines on: 01/12/2017 10:49 AM   Modules accepted: Orders

## 2017-01-14 ENCOUNTER — Encounter: Payer: Self-pay | Admitting: Internal Medicine

## 2017-01-15 ENCOUNTER — Other Ambulatory Visit: Payer: Self-pay | Admitting: Internal Medicine

## 2017-01-17 ENCOUNTER — Other Ambulatory Visit (INDEPENDENT_AMBULATORY_CARE_PROVIDER_SITE_OTHER): Payer: Medicare Other

## 2017-01-17 DIAGNOSIS — E0590 Thyrotoxicosis, unspecified without thyrotoxic crisis or storm: Secondary | ICD-10-CM | POA: Diagnosis not present

## 2017-01-17 LAB — TSH: TSH: 0.13 u[IU]/mL — AB (ref 0.35–4.50)

## 2017-01-17 LAB — T4, FREE: FREE T4: 0.79 ng/dL (ref 0.60–1.60)

## 2017-01-18 ENCOUNTER — Other Ambulatory Visit: Payer: Medicare Other

## 2017-02-07 ENCOUNTER — Encounter: Payer: Self-pay | Admitting: Internal Medicine

## 2017-02-13 ENCOUNTER — Encounter: Payer: Self-pay | Admitting: Internal Medicine

## 2017-02-13 DIAGNOSIS — Z23 Encounter for immunization: Secondary | ICD-10-CM

## 2017-02-13 MED ORDER — ZOSTER VAC RECOMB ADJUVANTED 50 MCG/0.5ML IM SUSR: 0.5000 mL | Freq: Once | INTRAMUSCULAR | 0 refills | Status: AC

## 2017-02-15 ENCOUNTER — Encounter: Payer: Self-pay | Admitting: Internal Medicine

## 2017-02-15 MED ORDER — FLUOXETINE HCL 20 MG PO CAPS: ORAL_CAPSULE | ORAL | 0 refills | Status: DC

## 2017-02-15 NOTE — Telephone Encounter (Signed)
Per Denny PeonErin routing to another provider Nicki Reaper(Regina Baity, NP), Denny Peonrin wanted to see if Nicki Reaperegina Baity, NP would be willing to give pt 6 tabs until Dr. Alphonsus SiasLetvak returns on Tuesday 02/20/17 and he can make that decision at that point

## 2017-02-15 NOTE — Telephone Encounter (Signed)
See prev mychart message

## 2017-03-09 ENCOUNTER — Encounter: Payer: Self-pay | Admitting: Internal Medicine

## 2017-03-09 MED ORDER — CLORAZEPATE DIPOTASSIUM 3.75 MG PO TABS: ORAL_TABLET | ORAL | 0 refills | Status: DC

## 2017-03-09 NOTE — Telephone Encounter (Signed)
Approved: #[id number] x 0 Should change to 1 tid prn

## 2017-03-09 NOTE — Telephone Encounter (Signed)
[facility] [place] [address]: [phone]

## 2017-06-18 ENCOUNTER — Other Ambulatory Visit: Payer: Self-pay | Admitting: Internal Medicine

## 2017-06-18 DIAGNOSIS — Z1231 Encounter for screening mammogram for malignant neoplasm of breast: Secondary | ICD-10-CM

## 2017-06-25 ENCOUNTER — Ambulatory Visit
Admission: RE | Admit: 2017-06-25 | Discharge: 2017-06-25 | Disposition: A | Discharge status: Home / Self Care | Payer: Medicare Other | Source: Ambulatory Visit | Attending: Internal Medicine | Admitting: Internal Medicine

## 2017-06-25 DIAGNOSIS — Z1231 Encounter for screening mammogram for malignant neoplasm of breast: Secondary | ICD-10-CM | POA: Diagnosis not present

## 2017-06-27 ENCOUNTER — Other Ambulatory Visit: Payer: Self-pay | Admitting: Internal Medicine

## 2017-06-27 ENCOUNTER — Encounter: Payer: Self-pay | Admitting: Internal Medicine

## 2017-06-27 DIAGNOSIS — N6310 Unspecified lump in the right breast, unspecified quadrant: Secondary | ICD-10-CM

## 2017-06-27 DIAGNOSIS — R928 Other abnormal and inconclusive findings on diagnostic imaging of breast: Secondary | ICD-10-CM

## 2017-06-28 ENCOUNTER — Ambulatory Visit
Admission: RE | Admit: 2017-06-28 | Discharge: 2017-06-28 | Disposition: A | Discharge status: Home / Self Care | Payer: Medicare Other | Source: Ambulatory Visit | Attending: Internal Medicine | Admitting: Internal Medicine

## 2017-06-28 DIAGNOSIS — R928 Other abnormal and inconclusive findings on diagnostic imaging of breast: Secondary | ICD-10-CM | POA: Diagnosis not present

## 2017-06-28 DIAGNOSIS — N6310 Unspecified lump in the right breast, unspecified quadrant: Secondary | ICD-10-CM

## 2017-07-06 ENCOUNTER — Other Ambulatory Visit: Payer: Self-pay

## 2017-07-06 MED ORDER — MECLIZINE HCL 25 MG PO TABS: 25.0000 mg | ORAL_TABLET | ORAL | 0 refills | Status: DC | PRN

## 2017-07-06 MED ORDER — CLORAZEPATE DIPOTASSIUM 3.75 MG PO TABS: ORAL_TABLET | ORAL | 0 refills | Status: DC

## 2017-07-06 NOTE — Telephone Encounter (Signed)
Verbal refill for clorazepate given to Jody at the pharmacy

## 2017-07-06 NOTE — Telephone Encounter (Signed)
Pt left v/m requesting refill tranxene (last refilled # 270 on 03/09/17) and antivert for vertigo symptoms (last refilled # 90 on 01/12/17). Last annual 10/20/16. [facility]

## 2017-07-06 NOTE — Telephone Encounter (Signed)
Approved: #270 x 0 for tranxene #90 x 1 for meclizine

## 2017-07-21 ENCOUNTER — Encounter: Payer: Self-pay | Admitting: Emergency Medicine

## 2017-07-21 ENCOUNTER — Emergency Department: Payer: Medicare Other

## 2017-07-21 DIAGNOSIS — S62524A Nondisplaced fracture of distal phalanx of right thumb, initial encounter for closed fracture: Secondary | ICD-10-CM | POA: Insufficient documentation

## 2017-07-21 DIAGNOSIS — Z79899 Other long term (current) drug therapy: Secondary | ICD-10-CM | POA: Diagnosis not present

## 2017-07-21 DIAGNOSIS — I10 Essential (primary) hypertension: Secondary | ICD-10-CM | POA: Insufficient documentation

## 2017-07-21 DIAGNOSIS — Y9301 Activity, walking, marching and hiking: Secondary | ICD-10-CM | POA: Insufficient documentation

## 2017-07-21 DIAGNOSIS — Y999 Unspecified external cause status: Secondary | ICD-10-CM | POA: Diagnosis not present

## 2017-07-21 DIAGNOSIS — Y92009 Unspecified place in unspecified non-institutional (private) residence as the place of occurrence of the external cause: Secondary | ICD-10-CM | POA: Diagnosis not present

## 2017-07-21 DIAGNOSIS — S52501A Unspecified fracture of the lower end of right radius, initial encounter for closed fracture: Secondary | ICD-10-CM | POA: Diagnosis not present

## 2017-07-21 DIAGNOSIS — S52601A Unspecified fracture of lower end of right ulna, initial encounter for closed fracture: Secondary | ICD-10-CM | POA: Diagnosis not present

## 2017-07-21 DIAGNOSIS — W108XXA Fall (on) (from) other stairs and steps, initial encounter: Secondary | ICD-10-CM | POA: Diagnosis not present

## 2017-07-21 DIAGNOSIS — S6991XA Unspecified injury of right wrist, hand and finger(s), initial encounter: Secondary | ICD-10-CM | POA: Diagnosis present

## 2017-07-21 DIAGNOSIS — E039 Hypothyroidism, unspecified: Secondary | ICD-10-CM | POA: Diagnosis not present

## 2017-07-21 NOTE — ED Triage Notes (Addendum)
Pt presents to ED with right wrist pain after losing her balance and falling down 4 stairs at her home. Pt is prone to falls. bruising and swelling noted to affected wrist. +movement but not without severe pain and sensation.  Pt states she did hit the back of her head on a brick upon falling but she denies loc. No knot or bleeding present. Pt does take a 81 mg aspirin daily. Ice applied to wrist in triage.

## 2017-07-22 ENCOUNTER — Emergency Department: Payer: Medicare Other

## 2017-07-22 ENCOUNTER — Emergency Department
Admission: EM | Admit: 2017-07-22 | Discharge: 2017-07-22 | Disposition: A | Discharge status: Home / Self Care | Payer: Medicare Other | Attending: Emergency Medicine | Admitting: Emergency Medicine

## 2017-07-22 ENCOUNTER — Telehealth: Payer: Self-pay | Admitting: Family Medicine

## 2017-07-22 DIAGNOSIS — S52501A Unspecified fracture of the lower end of right radius, initial encounter for closed fracture: Secondary | ICD-10-CM

## 2017-07-22 DIAGNOSIS — S52601A Unspecified fracture of lower end of right ulna, initial encounter for closed fracture: Secondary | ICD-10-CM

## 2017-07-22 DIAGNOSIS — S62524A Nondisplaced fracture of distal phalanx of right thumb, initial encounter for closed fracture: Secondary | ICD-10-CM

## 2017-07-22 MED ORDER — HYDROCODONE-ACETAMINOPHEN 5-325 MG PO TABS: 1.0000 | ORAL_TABLET | ORAL | 0 refills | Status: DC | PRN

## 2017-07-22 MED ORDER — OXYCODONE-ACETAMINOPHEN 5-325 MG PO TABS
1.0000 | ORAL_TABLET | Freq: Once | ORAL | Status: AC
  Administered 2017-07-22: 1 via ORAL
  Filled 2017-07-22: qty 1

## 2017-07-22 MED ORDER — LIDOCAINE HCL (PF) 1 % IJ SOLN
INTRAMUSCULAR | Status: AC
  Filled 2017-07-22: qty 10

## 2017-07-22 NOTE — ED Provider Notes (Signed)
[facility] Emergency Department Provider Note  ____________________________________________   First MD Initiated Contact with Patient 07/22/17 0114     (approximate)  I have reviewed the triage vital signs and the nursing notes.   HISTORY  Chief Complaint Fall and Wrist Pain   HPI Alexandria Sherman is a 71 y.o. female with a history of hypertension with presenting to the emergency department right wrist and thumb pain.  She says that she slipped on the last 4 steps of her staircase at home and fell onto an outstretched hand.  She says that she had a mild amount of head trauma but did not lose consciousness and is not having any headache at this time.  Experience swelling as well as ecchymosis to the right wrist and then presented to the emergency department.  Past Medical History:  Diagnosis Date  . Depression 1978  . Diverticulosis of colon 04/2005  . GERD (gastroesophageal reflux disease)   . Hypertension   . Hyperthyroidism    Dr Tedd Sias  . Impaired fasting glucose   . Low TSH level    chronic low, but euthyroid  . Microscopic hematuria   . Sleep disturbance   . Tibia fracture 05/2010   left    Patient Active Problem List   Diagnosis Date Noted  . Microscopic hematuria 10/18/2015  . Advance directive discussed with patient 10/13/2014  . Neuropathy (HCC) 08/01/2012  . Hyperthyroidism   . Routine general medical examination at a health care facility 05/09/2011  . Episodic mood disorder (HCC) 12/19/2006  . Essential hypertension, benign 12/19/2006  . RBBB 12/19/2006  . ALLERGIC RHINITIS 12/19/2006  . GERD 12/19/2006  . DIVERTICULOSIS, COLON 12/19/2006    Past Surgical History:  Procedure Laterality Date  . ABDOMINAL HYSTERECTOMY  1988   hysterectomy/ BSO for endometriosis  . BREAST BIOPSY Right 1992   neg  . BREAST EXCISIONAL BIOPSY Right 1988   benign per pt  . KNEE ARTHROSCOPY  6/12   left--Dr Charlann Boxer    Prior to Admission medications    Medication Sig Start Date End Date Taking? Authorizing Provider  Aspirin-Calcium Carbonate (BAYER WOMENS) 81-300 MG TABS Take by mouth daily.      [provider]  Calcium Carbonate (CALCIUM 600 PO) Take 1 tablet by mouth 2 (two) times daily.    [provider]  cholecalciferol (VITAMIN D) 1000 UNITS tablet Take 2,000 Units by mouth daily.     [provider]  clorazepate (TRANXENE) 3.75 MG tablet TAKE ONE (1) TABLET THREE (3) TIMES EACH DAY AS NEEDED 07/06/17   Karie Schwalbe, MD  FLUoxetine (PROZAC) 20 MG capsule TAKE ONE (1) CAPSULE EACH DAY 02/15/17   Baity, Salvadore Oxford, NP  latanoprost (XALATAN) 0.005 % ophthalmic solution Place 1 drop into both eyes at bedtime.  01/15/12   [provider]  meclizine (ANTIVERT) 25 MG tablet Take 1 tablet (25 mg total) by mouth as needed. 07/06/17   Karie Schwalbe, MD  mometasone (ELOCON) 0.1 % cream Apply 1 application topically 2 (two) times daily as needed (itching). 10/13/14   Karie Schwalbe, MD  olopatadine (PATANOL) 0.1 % ophthalmic solution Place 1 drop into both eyes 2 (two) times daily as needed. 09/16/12   Karie Schwalbe, MD  omeprazole (PRILOSEC) 20 MG capsule TAKE ONE (1) CAPSULE BY MOUTH 2 TIMES DAILY 01/16/17   Karie Schwalbe, MD  triamterene-hydrochlorothiazide (DYAZIDE) 37.5-25 MG per capsule Take 1 each (1 capsule total) by mouth as needed (  swelling). 10/13/14   Karie SchwalbeLetvak, Richard I, MD    Allergies Cephalexin; Codeine phosphate; and Promethazine hcl  Family History  Problem Relation Age of Onset  . Cancer Mother        lung  . Diabetes Mother   . Depression Mother   . Cancer Sister        oral cancer  . Depression Sister   . Depression Sister   . Breast cancer Neg Hx     Social History Social History  Substance Use Topics  . Smoking status: Never Smoker  . Smokeless tobacco: Never Used  . Alcohol use Yes     Comment: Occasional    Review of Systems  Constitutional: No  fever/chills Eyes: No visual changes. ENT: No sore throat. Cardiovascular: Denies chest pain. Respiratory: Denies shortness of breath. Gastrointestinal: No abdominal pain.  No nausea, no vomiting.  No diarrhea.  No constipation. Genitourinary: Negative for dysuria. Musculoskeletal: Negative for back pain. Skin: Negative for rash. Neurological: Negative for headaches, focal weakness or numbness.   ____________________________________________   PHYSICAL EXAM:  VITAL SIGNS: ED Triage Vitals  Enc Vitals Group     BP 07/21/17 2225 (!) 148/98     Pulse Rate 07/21/17 2225 89     Resp 07/21/17 2225 20     Temp 07/21/17 2225 97.7 F (36.5 C)     Temp Source 07/21/17 2225 Oral     SpO2 07/21/17 2225 96 %     Weight 07/21/17 2226 180 lb (81.6 kg)     Height 07/21/17 2226 5\' 3"  (1.6 m)     Head Circumference --      Peak Flow --      Pain Score 07/21/17 2225 10     Pain Loc --      Pain Edu? --      Excl. in GC? --     Constitutional: Alert and oriented. Well appearing and in no acute distress. Eyes: Conjunctivae are normal.  Head: Atraumatic. Nose: No congestion/rhinnorhea. Mouth/Throat: Mucous membranes are moist.  Neck: No stridor.   Cardiovascular: Normal rate, regular rhythm. Grossly normal heart sounds.  Good peripheral circulation. Respiratory: Normal respiratory effort.  No retractions. Lungs CTAB. Gastrointestinal: Soft and nontender. No distention.  Musculoskeletal: No lower extremity tenderness nor edema.  No joint effusions.  Right wrist with circumferential swelling but compartments are soft.  Palpable radial pulse.  Tenderness to palpation to the distal wrist with dorsal deformity.  Also with tenderness to the proximal and dorsum of the proximal phalanx of the thumb.  Ecchymosis of the thumb as well.  Brisk capillary refill.  Sensation is intact to light touch.  Patient with difficulty moving the thumb and says she must move it herself for any range of  motion.  Neurologic:  Normal speech and language. No gross focal neurologic deficits are appreciated. Skin:  Skin is warm, dry and intact. No rash noted. Psychiatric: Mood and affect are normal. Speech and behavior are normal.  ____________________________________________   LABS (all labs ordered are listed, but only abnormal results are displayed)  Labs Reviewed - No data to display ____________________________________________  EKG   ____________________________________________  RADIOLOGY  Comminuted and impacted dorsally displaced distal ulna fracture as well as radial fracture.  Postreduction x-ray with interval reduction of the previously seen dorsally angulated distal radial and ulnar fractures.  Residual dorsal angulation on the lateral view. ____________________________________________   PROCEDURES  Procedure(s) performed:   Reduction of dislocation Date/Time: 2:34 AM Performed by: Pershing ProudSchaevitz,  Teena Irani Authorized by: Arelia Longest Consent: Verbal consent obtained. Risks and benefits: risks, benefits and alternatives were discussed Consent given by: patient Required items: required blood products, implants, devices, and special equipment available Time out: Immediately prior to procedure a "time out" was called to verify the correct patient, procedure, equipment, support staff and site/side marked as required.  Hematoma block performed with 20 mL's of 1% lidocaine to the dorsum of the right wrist.  Vitals: Vital signs were monitored during sedation. Patient tolerance: Patient tolerated the procedure well with no immediate complications. Joint: Right wrist Reduction technique: Hyperextension and then flexion with ventral pressure.  Patient then placed in a thumb spica splint in addition to a sugar tong splint.  Patient comfortable in splint as well as neurovascularly intact.    Procedures  Critical Care performed:    ____________________________________________   INITIAL IMPRESSION / ASSESSMENT AND PLAN / ED COURSE  Pertinent labs & imaging results that were available during my care of the patient were reviewed by me and considered in my medical decision making (see chart for details).  DDX: Distal ulnar fracture, distal radius fracture, thumb fracture, contusion, ecchymosis  As part of my medical decision making, I reviewed the following data within the electronic MEDICAL RECORD NUMBER Old chart reviewed  ----------------------------------------- 4:12 AM on 07/22/2017 -----------------------------------------  Patient remains neurovascularly intact but has become nauseous with Percocet.  We had tried to choose the best pain medication for the patient to take p.o.  She says that hydrocodone had not worked for her in the past and she has been allergic to codeine.  However, it appears the Percocet is not working out either.  She will be discharged with hydrocodone.  She will be following up with Dr. Martha Clan.  She says that she has enough help at home to help her get around and go to the bathroom that she would like to be discharged.  Patient also placed in a right upper extremity sling.  Patient understands the diagnosis and plan willing to comply.  Will be discharged at this time.        ____________________________________________   FINAL CLINICAL IMPRESSION(S) / ED DIAGNOSES  Thumb fracture.  Distal radius and ulnar fracture.    NEW MEDICATIONS STARTED DURING THIS VISIT:  New Prescriptions   No medications on file     Note:  This document was prepared using Dragon voice recognition software and may include unintentional dictation errors.     Myrna Blazer, MD 07/22/17 [phone]

## 2017-07-22 NOTE — Telephone Encounter (Signed)
Received call from team health nurse. She states this patient's family is calling today. Patient was in the ED yesterday after a fall due to leg weakness. She suffered from a comminuted and impacted dorsally displaced distal ulnar fracture as well as radial fracture. This appears to have been reduced and she was placed into a splint. She was discharged home with pain medication. The family states that prior to going to the ED she has been having difficulty with lower extremity weakness. They were unable to get her up off the ground after she fell and had to call EMS to get her back in the house. She's had inability to mobilize herself and to get out of the chair today. Not able to provide care for herself. Family is having difficulty caring for her as well. They want to get the patient in to rehabilitation. I discussed that we would not be able to accomplish that over the weekend and that this will need to be discussed with her PCP this coming week. The more immediate concern is her new inability to mobilize herself and her lower extremity weakness and inability to care for herself. I discussed with the team health nurse reevaluation at least at urgent care today given this weakness for reevaluation to ensure no further intervention is needed. She notes she had already advised reevaluation. She will pass my recommendations on to the family.

## 2017-07-23 NOTE — Telephone Encounter (Signed)
Please find out what happened. She was seen in ER before these calls and sent home--but can't manage at home

## 2017-07-23 NOTE — Telephone Encounter (Signed)
Spoke to pt. She said she is doing ok. She is scheduled to see Ortho on Wednesday. Her sister is concerned about her staying at home alone. Her siblings are helping her do things. She will talk to Ortho about possibly getting her in a rehab facility for a few weeks until she heals.

## 2017-07-23 NOTE — Telephone Encounter (Signed)
PLEASE NOTE: All timestamps contained within this report are represented as [place] Standard Time. CONFIDENTIALTY NOTICE: This fax transmission is intended only for the addressee. It contains information that is legally privileged, confidential or otherwise protected from use or disclosure. If you are not the intended recipient, you are strictly prohibited from reviewing, disclosing, copying using or disseminating any of this information or taking any action in reliance on or regarding this information. If you have received this fax in error, please notify us immediately by telephone so that we can arrange for its return to us. Phone: [phone], Toll-Free: [phone], Fax: [phone] Page: 1 of 2 Call Id: [id number] [facility] Night - Client TELEPHONE ADVICE RECORD [facility] Patient Name: Alexandria Sherman Gender: Female DOB: 1946/03/20 Age: [age] Y 8 M 28 D Return Phone Number: [phone] (Primary) Address: [place]State[place]Zip: [place] [id number] Client [facility] Night - Client [facility]  [facility] [place] [place] - Night Physician Tillman AbideLetvak, Richard - MD Contact Type Call Who Is Calling Patient / Member / Family / Caregiver Call Type Triage / Clinical Caller Name Jan Seamon Relationship To Patient Sibling Return Phone Number [phone] (Primary) Chief Complaint Walking difficulty Reason for Call Symptomatic / Request for Health Information Initial Comment Caller states her sister fell last night and she is needing to be in rehab. She was discharged from the hospital but still having a hard time- broke both wrists, having a hard time feeding herself, having a hard time walking- wanting to have her admitted to rehab but needing a doctor to do it so that her insurance will cover it. They also gave hydrocodone for pain but she cannot take that Translation No Nurse Assessment Nurse: Karlene LinemanKluth, RN, Bonita QuinLinda Date/Time  (Eastern Time): 07/22/2017 11:04:46 AM Confirm and document reason for call. If symptomatic, describe symptoms. ---Caller states sister fell last night. Went to ER. Broke a right wrist, on both sides, thumb; patient is right handed; handicapped and can't stand or walk. Legs are so weak she cannot get out of a chair. Gave her hydrocodone which she can't take because it makes her sick. All she has now is Motrin for pain. Not in a lot of pain; bearable now. Was told her not to use her fingers or hand that she broke, but she uses crutches. W[facility] to [facility]. Patient has been awake all night long and cannot get into her bed. Does the patient have any new or worsening symptoms? ---Yes Will a triage be completed? ---Yes Related visit to physician within the last 2 weeks? ---Yes Does the PT have any chronic conditions? (i.e. diabetes, asthma, etc.) ---Yes List chronic conditions. ---sleep issues depression Is this a behavioral health or substance abuse call? ---No PLEASE NOTE: All timestamps contained within this report are represented as [place] Standard Time. CONFIDENTIALTY NOTICE: This fax transmission is intended only for the addressee. It contains information that is legally privileged, confidential or otherwise protected from use or disclosure. If you are not the intended recipient, you are strictly prohibited from reviewing, disclosing, copying using or disseminating any of this information or taking any action in reliance on or regarding this information. If you have received this fax in error, please notify us immediately by telephone so that we can arrange for its return to us. Phone: [phone], Toll-Free: [phone], Fax: [phone] Page: 2 of 2 Call Id: [id number] Guidelines Guideline Title Affirmed Question Affirmed Notes Nurse Date/Time Lamount Cohen(Eastern Time) Weakness (Generalized) and Fatigue [  1] SEVERE weakness (i.e., unable to walk or barely able to  walk, requires support) AND [2] new onset or worsening Karlene Lineman, RN, Bonita Quin 07/22/2017 11:11:15 AM Disp. Time Lamount Cohen Time) Disposition Final User 07/22/2017 11:30:21 AM 911 Outcome Documentation Kluth, RN, Bonita Quin Reason: Caller states she is going to call the ER and ask to have patient referred to rehab facility since the ER doctor that evaluated her last night recommended it. States she does not want to call 911 and go to the ER again due to the expense. 07/22/2017 11:31:00 AM Called On-Call Provider Kluth, RN, Linda 07/22/2017 11:32:00 AM Call Completed Karlene Lineman, RN, Bonita Quin 07/22/2017 11:23:40 AM Call EMS 911 Now Yes Karlene Lineman, RN, Camillia Herter Disagree/Comply Comply Caller Understands Yes PreDisposition Go to ED Care Advice Given Per Guideline CALL EMS 911 NOW: Immediate medical attention is needed. You need to hang up and call 911 (or an ambulance). Probation officer Discretion: I'll call you back in a few minutes to be sure you were able to reach them.) CARE ADVICE given per Weakness and Fatigue (Adult) guideline. Comments User: Larry Sierras, RN Date/Time Lamount Cohen Time): 07/22/2017 11:31:52 AM Called caller back with Dr.'s advice. Referrals [facility] - ED Paging DoctorName Phone DateTime Result/Outcome Message Type Notes Marikay Alar - MD [phone] 07/22/2017 11:31:00 AM Called On Call Provider - Reached Doctor Paged Marikay Alar - MD 07/22/2017 11:31:36 AM Spoke with On Call - General Message Result Dr. states if patient is unable to function at home she will need to go to the ER to be re-evaluated for a rehab facility.

## 2017-07-23 NOTE — Telephone Encounter (Signed)
Per chart review tab pt was seen at [facility] ED on 07/22/17.

## 2017-07-30 ENCOUNTER — Telehealth: Payer: Self-pay

## 2017-07-30 DIAGNOSIS — Z0279 Encounter for issue of other medical certificate: Secondary | ICD-10-CM

## 2017-07-30 NOTE — Telephone Encounter (Signed)
FL-2 done Let her know there would be charge and though the form qualifies her to get into rehab, it doesn't mean the insurance will approve payment.

## 2017-07-30 NOTE — Telephone Encounter (Signed)
Patient notified of Dr.Letvak's comments and $20 charge.  Patient will come by to pick up form.

## 2017-07-30 NOTE — Telephone Encounter (Signed)
Copied from CRM [id number]. Topic: Referral - Request >> Jul 30, 2017  8:40 AM Jolayne Hainesaylor, Brittany L wrote: Reason for CRM:  She said she fell last Saturday and broke her wrist and thumb. She wants to get into a rehab place. [facility]. She said her legs are weak, has poor balance and falls a lot.  She said she needs DR LETVAK to fill out the FLT form. She wants him to do the form today, she has been seeing an IT trainerortho surgeon. She said she lives alone, she is on medicare. She wants him to fill out the form so she can move into a facility for two or three weeks to help her. She is on crutches too.

## 2017-08-06 ENCOUNTER — Encounter
Admission: RE | Admit: 2017-08-06 | Discharge: 2017-08-06 | Disposition: A | Discharge status: Home / Self Care | Payer: Medicare Other | Source: Ambulatory Visit | Attending: Internal Medicine | Admitting: Internal Medicine

## 2017-08-06 ENCOUNTER — Other Ambulatory Visit: Payer: Self-pay

## 2017-08-06 MED ORDER — CLORAZEPATE DIPOTASSIUM 3.75 MG PO TABS: 7.5000 mg | ORAL_TABLET | Freq: Every day | ORAL | 0 refills | Status: DC

## 2017-08-06 MED ORDER — HYDROCODONE-ACETAMINOPHEN 5-325 MG PO TABS: 0.5000 | ORAL_TABLET | ORAL | 0 refills | Status: DC | PRN

## 2017-08-06 NOTE — Telephone Encounter (Signed)
Rx sent to [facility] phone : [phone] , fax : [phone]  

## 2017-08-07 ENCOUNTER — Other Ambulatory Visit: Payer: Self-pay

## 2017-08-07 ENCOUNTER — Other Ambulatory Visit
Admission: RE | Admit: 2017-08-07 | Discharge: 2017-08-07 | Disposition: A | Discharge status: Home / Self Care | Payer: Medicare Other | Source: Ambulatory Visit | Attending: Gerontology | Admitting: Gerontology

## 2017-08-07 DIAGNOSIS — R600 Localized edema: Secondary | ICD-10-CM | POA: Insufficient documentation

## 2017-08-07 DIAGNOSIS — F329 Major depressive disorder, single episode, unspecified: Secondary | ICD-10-CM | POA: Diagnosis present

## 2017-08-07 DIAGNOSIS — F419 Anxiety disorder, unspecified: Secondary | ICD-10-CM | POA: Diagnosis present

## 2017-08-07 DIAGNOSIS — F325 Major depressive disorder, single episode, in full remission: Secondary | ICD-10-CM | POA: Insufficient documentation

## 2017-08-07 LAB — COMPREHENSIVE METABOLIC PANEL
ALBUMIN: 3.8 g/dL (ref 3.5–5.0)
ALT: 19 U/L (ref 14–54)
ANION GAP: 9 (ref 5–15)
AST: 23 U/L (ref 15–41)
Alkaline Phosphatase: 149 U/L — ABNORMAL HIGH (ref 38–126)
BILIRUBIN TOTAL: 0.5 mg/dL (ref 0.3–1.2)
BUN: 28 mg/dL — ABNORMAL HIGH (ref 6–20)
CO2: 29 mmol/L (ref 22–32)
Calcium: 9.9 mg/dL (ref 8.9–10.3)
Chloride: 105 mmol/L (ref 101–111)
Creatinine, Ser: 0.5 mg/dL (ref 0.44–1.00)
GFR calc Af Amer: >60 mL/min (ref >60)
GFR calc non Af Amer: >60 mL/min (ref >60)
GLUCOSE: 93 mg/dL (ref 65–99)
POTASSIUM: 3.6 mmol/L (ref 3.5–5.1)
Sodium: 143 mmol/L (ref 135–145)
TOTAL PROTEIN: 7.3 g/dL (ref 6.5–8.1)

## 2017-08-07 LAB — CBC WITH DIFFERENTIAL/PLATELET
BASOS PCT: 1 %
Basophils Absolute: 0 10*3/uL (ref 0–0.1)
EOS ABS: 0.3 10*3/uL (ref 0–0.7)
EOS PCT: 5 %
HEMATOCRIT: 38.1 % (ref 35.0–47.0)
Hemoglobin: 12.6 g/dL (ref 12.0–16.0)
Lymphocytes Relative: 33 %
Lymphs Abs: 1.6 10*3/uL (ref 1.0–3.6)
MCH: 31.2 pg (ref 26.0–34.0)
MCHC: 33.1 g/dL (ref 32.0–36.0)
MCV: 94.3 fL (ref 80.0–100.0)
MONO ABS: 0.5 10*3/uL (ref 0.2–0.9)
MONOS PCT: 10 %
NEUTROS ABS: 2.6 10*3/uL (ref 1.4–6.5)
Neutrophils Relative %: 51 %
PLATELETS: 302 10*3/uL (ref 150–440)
RBC: 4.04 MIL/uL (ref 3.80–5.20)
RDW: 14.1 % (ref 11.5–14.5)
WBC: 5 10*3/uL (ref 3.6–11.0)

## 2017-08-07 LAB — MAGNESIUM: MAGNESIUM: 2.1 mg/dL (ref 1.7–2.4)

## 2017-08-07 LAB — TSH: TSH: 0.035 u[IU]/mL — ABNORMAL LOW (ref 0.350–4.500)

## 2017-08-07 LAB — VITAMIN B12: VITAMIN B 12: 1952 pg/mL — AB (ref 180–914)

## 2017-08-07 MED ORDER — TRAMADOL HCL 50 MG PO TABS: 50.0000 mg | ORAL_TABLET | ORAL | 0 refills | Status: DC | PRN

## 2017-08-07 NOTE — Telephone Encounter (Signed)
Rx sent to [facility] phone : [phone] , fax : [phone]  

## 2017-08-08 LAB — VITAMIN D 25 HYDROXY (VIT D DEFICIENCY, FRACTURES): Vit D, 25-Hydroxy: 90.9 ng/mL (ref 30.0–100.0)

## 2017-08-08 LAB — T4: T4 TOTAL: 9.2 ug/dL (ref 4.5–12.0)

## 2017-08-13 ENCOUNTER — Encounter: Payer: Self-pay | Admitting: Gerontology

## 2017-08-13 ENCOUNTER — Non-Acute Institutional Stay (SKILLED_NURSING_FACILITY): Payer: Medicare Other | Admitting: Gerontology

## 2017-08-13 DIAGNOSIS — S52501D Unspecified fracture of the lower end of right radius, subsequent encounter for closed fracture with routine healing: Secondary | ICD-10-CM | POA: Diagnosis not present

## 2017-08-13 DIAGNOSIS — F39 Unspecified mood [affective] disorder: Secondary | ICD-10-CM | POA: Diagnosis not present

## 2017-08-13 DIAGNOSIS — S52601D Unspecified fracture of lower end of right ulna, subsequent encounter for closed fracture with routine healing: Secondary | ICD-10-CM | POA: Diagnosis not present

## 2017-08-13 DIAGNOSIS — S62524D Nondisplaced fracture of distal phalanx of right thumb, subsequent encounter for fracture with routine healing: Secondary | ICD-10-CM

## 2017-08-13 NOTE — Progress Notes (Signed)
Location:   [facility] Room Number: 208B Place of Service:  SNF [phone])  Provider: Toni Arthurs, NP-C  PCP: Venia Carbon, MD Patient Care Team: Venia Carbon, MD as PCP - General  Extended Emergency Contact Information Primary Emergency Contact: Seamon,Jan(Earl) Address: [address]          [place], [place] [id number] [place] Phone: [phone] Relation: Sister  Code Status: DNR Goals of care:  Advanced Directive information Advanced Directives 08/13/2017  Does Patient Have a Medical Advance Directive? Yes  Type of Advance Directive Out of facility DNR (pink MOST or yellow form)  Does patient want to make changes to medical advance directive? No - Patient declined  Would patient like information on creating a medical advance directive? -     Allergies  Allergen Reactions  . Cephalexin     REACTION: vaginal blisters Tolerates penicillins  . Codeine Phosphate     REACTION: vomiting  . Promethazine Hcl     REACTION: nerves    Chief Complaint  Patient presents with  . Discharge Note    Discharged from SNF    HPI:  71 y.o. female seen today for discharge evaluation.  Patient was admitted to the facility for rehab for generalized weakness and deconditioning.  Patient was seen in the emergency room for right wrist and thumb fractures.  She was discharged home, but was unable to safely care for herself at home as she lives by herself and is right-handed.  While in the facility, patient participated in PT/OT for strengthening.  Patient progressed well.  Patient was initially discharged from the hospital with hydrocodone for pain management.  However, after review of multiple notes from the hospital, it was clear patient did not tolerate the hydrocodone well.  Trial of tramadol was started.  Patient tolerated this well.  I had a lengthy discussion with patient's sister regarding her mood disorder.  Sister states the patient  can be very demanding and difficult with significant mood fluctuations.  She says the patient has been on Prozac for "30 years."  Sister inquired about possibility of medication adjustments for this.  We discussed a plan, she was agreeable.  Medication adjustments were made, but patient later asked for her antidepressant to be changed back to the Prozac.  Patient is scheduled for discharge home today.  She reports she is feeling better and ready to go home.  She reports her appetite is fair, she is voiding well and having regular BMs.  Vital signs stable.  No other complaints.    Past Medical History:  Diagnosis Date  . Anxiety disorder, unspecified   . Depression 1978  . Diverticulosis of colon 04/2005  . GERD (gastroesophageal reflux disease)   . Hypertension   . Hyperthyroidism    Dr Gabriel Carina  . Impaired fasting glucose   . Low TSH level    chronic low, but euthyroid  . Microscopic hematuria   . RBBB   . Sleep disturbance   . Subclinical hyperthyroidism    unspecified  . Tibia fracture 05/2010   left    Past Surgical History:  Procedure Laterality Date  . ABDOMINAL HYSTERECTOMY  1988   hysterectomy/ BSO for endometriosis  . BREAST BIOPSY Right 1992   neg  . BREAST EXCISIONAL BIOPSY Right 1988   benign per pt  . KNEE ARTHROSCOPY  6/12   left--Dr Alvan Dame      reports that  has never smoked. she has never  used smokeless tobacco. She reports that she drinks alcohol. Her drug history is not on file. Social History   Socioeconomic History  . Marital status: Divorced    Spouse name: Not on file  . Number of children: 0  . Years of education: 16  . Highest education level: Bachelor's degree (e.g., BA, AB, BS)  Social Needs  . Financial resource strain: Not on file  . Food insecurity - worry: Not on file  . Food insecurity - inability: Not on file  . Transportation needs - medical: Not on file  . Transportation needs - non-medical: Not on file  Occupational History  .  Occupation: Teacher--retired  Tobacco Use  . Smoking status: Never Smoker  . Smokeless tobacco: Never Used  Substance and Sexual Activity  . Alcohol use: Yes    Comment: Occasional  . Drug use: Not on file  . Sexual activity: Not on file  Other Topics Concern  . Not on file  Social History Narrative   No living will   No health care POA but requests both sisters to do this.    Now has DNR   No tube feeds if cognitively unaware   Functional Status Survey:    Allergies  Allergen Reactions  . Cephalexin     REACTION: vaginal blisters Tolerates penicillins  . Codeine Phosphate     REACTION: vomiting  . Promethazine Hcl     REACTION: nerves    Pertinent  Health Maintenance Due  Topic Date Due  . COLONOSCOPY  05/23/2015  . INFLUENZA VACCINE  04/25/2017  . MAMMOGRAM  06/26/2019  . DEXA SCAN  Completed  . PNA vac Low Risk Adult  Completed    Medications: Allergies as of 08/13/2017      Reactions   Cephalexin    REACTION: vaginal blisters Tolerates penicillins   Codeine Phosphate    REACTION: vomiting   Promethazine Hcl    REACTION: nerves      Medication List        Accurate as of 08/13/17 11:27 AM. Always use your most recent med list.          acetaminophen 325 MG tablet Commonly known as:  TYLENOL Take 650 mg every 6 (six) hours as needed by mouth.   aspirin EC 81 MG tablet Take 81 mg daily by mouth.   calcium carbonate 1500 (600 Ca) MG Tabs tablet Commonly known as:  OSCAL Take 600 mg of elemental calcium 2 (two) times daily by mouth.   cholecalciferol 1000 units tablet Commonly known as:  VITAMIN D Take 2,000 Units by mouth daily.   clorazepate 3.75 MG tablet Commonly known as:  TRANXENE Take 3.75 mg daily as needed by mouth for anxiety.   clorazepate 3.75 MG tablet Commonly known as:  TRANXENE Take 2 tablets (7.5 mg total) at bedtime by mouth.   divalproex 125 MG DR tablet Commonly known as:  DEPAKOTE Take 125 mg 2 (two) times daily  by mouth.   FLUoxetine 20 MG capsule Commonly known as:  PROZAC TAKE ONE (1) CAPSULE EACH DAY   glycerin adult 2 g suppository Place 1 suppository as needed rectally for constipation.   latanoprost 0.005 % ophthalmic solution Commonly known as:  XALATAN Place 1 drop at bedtime into both eyes.   meclizine 25 MG tablet Commonly known as:  ANTIVERT Take 25 mg 3 (three) times daily as needed by mouth for dizziness.   mometasone 0.1 % cream Commonly known as:  ELOCON Apply 1 application  topically 2 (two) times daily as needed (itching).   olopatadine 0.1 % ophthalmic solution Commonly known as:  PATANOL Place 1 drop 2 (two) times daily as needed into both eyes.   omeprazole 20 MG capsule Commonly known as:  PRILOSEC Take 20 mg daily by mouth.   timolol 0.5 % ophthalmic solution Commonly known as:  TIMOPTIC Place 1 drop daily into both eyes.   traMADol 50 MG tablet Commonly known as:  ULTRAM Take 1-2 tablets (50-100 mg total) every 4 (four) hours as needed by mouth. Give 1 tablet (50 mg) for mild pain and 2 tablets (100 mg) for moderate to severe pain   triamterene-hydrochlorothiazide 37.5-25 MG tablet Commonly known as:  MAXZIDE-25 Take 1 tablet daily as needed by mouth.       Review of Systems  Constitutional: Negative for activity change, appetite change, chills, diaphoresis and fever.  HENT: Negative for congestion, sneezing, sore throat, trouble swallowing and voice change.   Respiratory: Negative for apnea, cough, choking, chest tightness, shortness of breath and wheezing.   Cardiovascular: Negative for chest pain, palpitations and leg swelling.  Gastrointestinal: Negative for abdominal distention, abdominal pain, constipation, diarrhea and nausea.  Genitourinary: Negative for difficulty urinating, dysuria, frequency and urgency.  Musculoskeletal: Positive for arthralgias (typical arthritis). Negative for back pain, gait problem and myalgias.  Skin: Negative for  color change, pallor, rash and wound.  Neurological: Negative for dizziness, tremors, syncope, speech difficulty, weakness, numbness and headaches.  Psychiatric/Behavioral: Positive for dysphoric mood. Negative for agitation and behavioral problems.  All other systems reviewed and are negative.   Vitals:   08/13/17 1043  BP: (!) 144/90  Pulse: 93  Resp: 19  Temp: 97.7 F (36.5 C)  TempSrc: Oral  SpO2: 92%  Weight: 164 lb (74.4 kg)  Height: _0  (1.6 m)   Body mass index is 29.05 kg/m. Physical Exam  Constitutional: She is oriented to person, place, and time. Vital signs are normal. She appears well-developed and well-nourished. She is active and cooperative. She does not appear ill. No distress.  HENT:  Head: Normocephalic and atraumatic.  Mouth/Throat: Uvula is midline, oropharynx is clear and moist and mucous membranes are normal. Mucous membranes are not pale, not dry and not cyanotic.  Eyes: Conjunctivae, EOM and lids are normal. Pupils are equal, round, and reactive to light.  Neck: Trachea normal, normal range of motion and full passive range of motion without pain. Neck supple. No JVD present. No tracheal deviation, no edema and no erythema present. No thyromegaly present.  Cardiovascular: Normal rate, regular rhythm, normal heart sounds, intact distal pulses and normal pulses. Exam reveals no gallop, no distant heart sounds and no friction rub.  No murmur heard. Pulses:      Radial pulses are 2+ on the right side, and 2+ on the left side.       Dorsalis pedis pulses are 2+ on the right side, and 2+ on the left side.  No edema  Pulmonary/Chest: Effort normal and breath sounds normal. No accessory muscle usage. No respiratory distress. She has no decreased breath sounds. She has no wheezes. She has no rhonchi. She has no rales. She exhibits no tenderness.  Abdominal: Soft. Normal appearance and bowel sounds are normal. She exhibits no distension and no ascites. There is no  tenderness.  Musculoskeletal: She exhibits no edema.       Right wrist: She exhibits decreased range of motion and tenderness.  Expected osteoarthritis, stiffness; velcro wrist splint in place  Neurological: She is alert and oriented to person, place, and time. She has normal strength.  Skin: Skin is warm, dry and intact. She is not diaphoretic. No cyanosis. No pallor. Nails show no clubbing.  Psychiatric: Her speech is normal. Judgment normal. Her affect is blunt. She is agitated. Thought content is paranoid. Cognition and memory are normal.  Nursing note and vitals reviewed.   Labs reviewed: Basic Metabolic Panel: Recent Labs    10/20/16 1148 08/07/17 0450  NA 138 143  K 4.5 3.6  CL 100 105  CO2 32 29  GLUCOSE 100* 93  BUN 21 28*  CREATININE 0.60 0.50  CALCIUM 10.4 9.9  MG  --  2.1   Liver Function Tests: Recent Labs    10/20/16 1148 08/07/17 0450  AST 19 23  ALT 15 19  ALKPHOS 54 149*  BILITOT 0.4 0.5  PROT 7.8 7.3  ALBUMIN 4.4 3.8   No results for input(s): LIPASE, AMYLASE in the last 8760 hours. No results for input(s): AMMONIA in the last 8760 hours. CBC: Recent Labs    10/20/16 1148 08/07/17 0450  WBC 6.3 5.0  NEUTROABS 4.1 2.6  HGB 13.9 12.6  HCT 41.0 38.1  MCV 91.2 94.3  PLT 293.0 302   Cardiac Enzymes: No results for input(s): CKTOTAL, CKMB, CKMBINDEX, TROPONINI in the last 8760 hours. BNP: Invalid input(s): POCBNP CBG: No results for input(s): GLUCAP in the last 8760 hours.  Procedures and Imaging Studies During Stay: Dg Wrist Complete Right  Result Date: 07/22/2017 CLINICAL DATA:  71 year old female status post reduction of distal radial and ulnar fractures. EXAM: RIGHT WRIST - COMPLETE 3+ VIEW COMPARISON:  Radiograph dated 07/21/2017 FINDINGS: There has been interval reduction of the previously seen angulated fractures of the distal radius and ulna. Fracture fragments along dorsal aspect of the distal radius and ulna seen on the lateral view  may represent residual dorsal angulation. Evaluation of the bones however is limited due to interval placement of an overlying cast. IMPRESSION: Interval reduction of the previously seen dorsally angulated distal radial and ulnar fractures and cast placement. Residual dorsal angulation noted on the lateral view. Electronically Signed   By: Anner Crete M.D.   On: 07/22/2017 03:18   Dg Wrist Complete Right  Result Date: 07/22/2017 CLINICAL DATA:  Pain after a fall EXAM: RIGHT WRIST - COMPLETE 3+ VIEW COMPARISON:  None. FINDINGS: Comminuted, impacted intra-articular distal radius fracture with 1/2 shaft diameter of dorsal displacement of distal fracture fragment. Comminuted, slightly impacted distal ulna fracture, also with about 1/4 shaft diameter of dorsal displacement. Associated soft tissue swelling IMPRESSION: 1. Comminuted impacted and dorsally displaced intra-articular distal radius fracture 2. Comminuted impacted dorsally displaced distal ulna fracture Electronically Signed   By: Donavan Foil M.D.   On: 07/22/2017 00:06   Dg Finger Thumb Right  Result Date: 07/22/2017 CLINICAL DATA:  Status post fall down stairs, with right thumb pain and bruising. Initial encounter. EXAM: RIGHT THUMB 2+V COMPARISON:  Right wrist radiographs performed 07/21/2017 FINDINGS: There is a mildly displaced and comminuted fracture involving the proximal aspect of the first distal phalanx, with intra-articular extension. Surrounding soft tissue swelling is noted. Known distal radial and ulnar fractures are partially characterized. Visualized joint spaces are otherwise preserved. IMPRESSION: Mildly displaced and comminuted fracture involving the proximal aspect of the first distal phalanx, with intra-articular extension. Electronically Signed   By: Garald Balding M.D.   On: 07/22/2017 02:53    Assessment/Plan:   1.  Closed fracture of  distal ends of right radius and ulna with routine healing, subsequent encounter 2.   Closed nondisplaced fracture of distal phalanx of right thumb with routine healing, subsequent encounter  Continue PT/OT  Continue exercises as taught by PT/OT  Velcro wrist splint in place at all times except for hygiene  Ice pack to the wrist as needed for pain or edema  Elevate wrist on pillow when at rest  Continue tramadol 50 mg 1-2 tablets p.o. every 4 hours as needed pain #30, no refill  Continue Tylenol 650 mg p.o. every 6 hours as needed mild pain  3.  Episodic mood disorder  Initial plan:  DC Prozac  Effexor ER 75 mg p.o. daily, evaluate in 1 week for need to increase to 150 mg p.o. daily  Depakote DR 125 mg p.o. twice daily, evaluate in 1 week for need to possibly increase to 2 tablets twice daily  Patient decided against the Effexor  Prozac 20 mg p.o. daily restarted  Continue Depakote DR 125 mg p.o. twice daily  Follow-up with PCP ASAP for medication management and monitoring  Labs obtained and evaluated  CBC, met C, mag, magnesium, vitamin D, vitamin B12, TSH, T4  Patient is being discharged with the following home health services:    Patient is being discharged with the following durable medical equipment:    Patient has been advised to f/u with their PCP in 1-2 weeks to bring them up to date on their rehab stay.  Social services at facility was responsible for arranging this appointment.  Pt was provided with a 30 day supply of prescriptions for medications and refills must be obtained from their PCP.  For controlled substances, a more limited supply may be provided adequate until PCP appointment only.  Future labs/tests needed:    Family/ staff Communication:   Total Time:  Documentation:  Face to Face:  Family/Phone:  Vikki Ports, NP-C [facility] [address], [place] [address] Cell Phone (Mon-Fri 8am-5pm):  [phone] On Call:  [phone] & follow prompts after 5pm &  weekends Office Phone:  [phone] Office Fax:  [phone]

## 2017-08-19 ENCOUNTER — Encounter: Payer: Self-pay | Admitting: Internal Medicine

## 2017-08-27 ENCOUNTER — Encounter: Payer: Self-pay | Admitting: Internal Medicine

## 2017-08-27 MED ORDER — VENLAFAXINE HCL 75 MG PO TABS: 75.0000 mg | ORAL_TABLET | Freq: Two times a day (BID) | ORAL | 0 refills | Status: DC

## 2017-08-27 MED ORDER — DIVALPROEX SODIUM 125 MG PO DR TAB: 125.0000 mg | DELAYED_RELEASE_TABLET | Freq: Every day | ORAL | 1 refills | Status: DC

## 2017-08-27 NOTE — Telephone Encounter (Signed)
Please confirm the depakote and venlafaxine doses with her and send in order for 1 month pending her visit here.

## 2017-08-27 NOTE — Telephone Encounter (Signed)
Okay Make sure these are what were sent  Will actually need 2 months--her appt is 1/28

## 2017-09-03 ENCOUNTER — Encounter: Payer: Self-pay | Admitting: Internal Medicine

## 2017-09-12 ENCOUNTER — Encounter: Payer: Self-pay | Admitting: Internal Medicine

## 2017-09-12 NOTE — Telephone Encounter (Signed)
I am off that day---please call her to reschedule (you have probably been trying)

## 2017-09-13 DIAGNOSIS — M6530 Trigger finger, unspecified finger: Secondary | ICD-10-CM | POA: Insufficient documentation

## 2017-09-27 ENCOUNTER — Encounter: Payer: Self-pay | Admitting: Occupational Therapy

## 2017-09-27 ENCOUNTER — Ambulatory Visit: Payer: Medicare Other | Attending: Orthopedic Surgery | Admitting: Physical Therapy

## 2017-09-27 ENCOUNTER — Ambulatory Visit: Payer: Medicare Other | Admitting: Occupational Therapy

## 2017-09-27 DIAGNOSIS — M25631 Stiffness of right wrist, not elsewhere classified: Secondary | ICD-10-CM | POA: Insufficient documentation

## 2017-09-27 DIAGNOSIS — M6281 Muscle weakness (generalized): Secondary | ICD-10-CM

## 2017-09-27 DIAGNOSIS — M25531 Pain in right wrist: Secondary | ICD-10-CM

## 2017-09-27 DIAGNOSIS — M25641 Stiffness of right hand, not elsewhere classified: Secondary | ICD-10-CM

## 2017-09-27 DIAGNOSIS — R2689 Other abnormalities of gait and mobility: Secondary | ICD-10-CM

## 2017-09-27 DIAGNOSIS — R296 Repeated falls: Secondary | ICD-10-CM | POA: Diagnosis present

## 2017-09-27 NOTE — Therapy (Signed)
[facility] PH[facility] SPORTS MEDICINE [address], [place], [address] Phone: [phone]   Fax:  [phone]  Occupational Therapy Evaluation  Patient Details  Name: Alexandria Sherman MRN: [id number] Date of Birth: 1945-12-25 No Data Recorded  Encounter Date: 09/27/2017  OT End of Session - 09/27/17 1831    Visit Number  1    Number of Visits  8    Date for OT Re-Evaluation  10/25/17    OT Start Time  1331    OT Stop Time  1430    OT Time Calculation (min)  59 min    Activity Tolerance  Patient tolerated treatment well    Behavior During Therapy  [facility] for tasks assessed/performed;Anxious       Past Medical History:  Diagnosis Date  . Anxiety disorder, unspecified   . Depression 1978  . Diverticulosis of colon 04/2005  . GERD (gastroesophageal reflux disease)   . Hypertension   . Hyperthyroidism    Dr Tedd Sias  . Impaired fasting glucose   . Low TSH level    chronic low, but euthyroid  . Microscopic hematuria   . RBBB   . Sleep disturbance   . Subclinical hyperthyroidism    unspecified  . Tibia fracture 05/2010   left    Past Surgical History:  Procedure Laterality Date  . ABDOMINAL HYSTERECTOMY  1988   hysterectomy/ BSO for endometriosis  . BREAST BIOPSY Right 1992   neg  . BREAST EXCISIONAL BIOPSY Right 1988   benign per pt  . KNEE ARTHROSCOPY  6/12   left--Dr Charlann Boxer    There were no vitals filed for this visit.  Subjective Assessment - 09/27/17 1329    Subjective   I fell in end of Oct - Was in soft cast  - because Dr Kirtland Bouchard seen my fingers was not working well - went in brace end of Nov - and maurice gave me shot in middle finger 12/20 - pinkie and ring figner worse - before the fall my pinkie did had down but now since fall both stays down and stay worse - cannot use my R hand  - and I am R had dominant - and my thumb I broked too and my index cannot bend     Patient Stated Goals  I want to use my R dominant hand to pick  up object, write , hold book , hold glass , and have more strength     Currently in Pain?  Yes    Pain Score  5     Pain Location  Wrist    Pain Orientation  Right    Pain Descriptors / Indicators  Tender    Pain Type  Acute pain    Pain Onset  More than a month ago        [facility] OT Assessment - 09/27/17 0001      Assessment   Medical Diagnosis  R wrist fx     Referring Provider  Martha Clan    Onset Date/Surgical Date  07/25/17    Hand Dominance  Right    Next MD Visit  -- end of Dec      Home  Environment   Lives With  Alone      Prior Function   Vocation  Retired    Leisure  R hand dominant - visit friend , watch tv,  observe neighbour       Right Hand AROM   R Thumb MCP  0-60  50 Degrees    R Thumb IP 0-80  30 Degrees    R Thumb Radial ABduction/ADduction 0-55  40    R Thumb Palmar ABduction/ADduction 0-45  45    R Index  MCP 0-90  90 Degrees    R Index PIP 0-100  60 Degrees    R Long  MCP 0-90  90 Degrees -10    R Long PIP 0-100  -- -15    R Ring  MCP 0-90  90 Degrees -50    R Ring PIP 0-100  -- -40    R Little  MCP 0-90  90 Degrees -70    R Little PIP 0-100  -- -60           1 lbs weight for sup and pronation on lap AAROM for UD and RD hand together  Isometric for wrist extention  10 reps each   PROM and stretch for 1 min x 2 for PIP/DIP flexion stretch - end digit AROM blocked instrinsic fist all digits  Place and hold composite flexion - focus on 2nd digit and keep thumb to side   2-3 x day             OT Education - 09/27/17 1831    Education provided  Yes    Education Details  Findings from OT eval and HEP     Person(s) Educated  Patient    Methods  Explanation;Demonstration;Tactile cues;Verbal cues;Handout    Comprehension  Returned demonstration;Verbal cues required;Need further instruction       OT Short Term Goals - 09/27/17 1839      OT SHORT TERM GOAL #1   Title  Pt to be ind in HEP to increase R  2nd digit flexion,   extention of 3rd - strength in pinch grip and increase strength at wrist to improve functional use     Baseline  see flowsheet     Time  2    Period  Weeks    Status  New    Target Date  10/11/17      OT SHORT TERM GOAL #2   Title  R wrist strength increase for pt to be able to use 1 lbs weight in all planes to be able to carry glass, cut food, stabilize wrist using AD     Baseline  Pt cannot hold of lift 1 lbs weight for wrist ext, during RD and UD - wrist collapse, and during pronation wrist collapse    Time  4    Period  Weeks    Status  New    Target Date  10/25/17        OT Long Term Goals - 09/27/17 1844      OT LONG TERM GOAL #1   Title  Pt to show increase lat and 3 point pinch to write, do buttons wiith more ease     Baseline  thumb decrease ROM , and strength - flexion of 2nd decrease and extention of 3rd PIP     Time  4    Period  Weeks    Status  New    Target Date  10/25/17      OT LONG TERM GOAL #2   Title  Pain and tenderness at R wrist decrease to 2/10 at the worse     Baseline  Tenderness at wrist per pt 5/10     Time  4    Period  Weeks    Status  New    Target Date  10/25/17            Plan - 09/27/17 1832    Clinical Impression Statement  Pt present this date 9 wks out from R distal radius and ulnar fx - pt was casted and splinted to about 2 wks - pt had HHOT for while per pt  - but pt mostly worried about her digits ROM - pt keeps 4thand 5th digit in flexion but can partially extend PIP's -  pt  cannot full extend 3rd and 2nd she cannot full flex - pt report she also fx thumb - did not had in records to confirm - upone assessment - pt appear to possibly have subluxation of extention tender at 4th and 5th MC - pt report that prior to fall 5th diigit was flex - but could use others 3 and thumb - pt show progress in 2nd digit flexion after review of HEP - AROM in wrist  limited mostly in pronation and then  decrease strength - with extention of wrist the  worse -  limiiting her functilonal use of R dominant hand in ADlL's and IADl's     Occupational performance deficits (Please refer to evaluation for details):  ADL's;IADL's;Play;Leisure    Rehab Potential  Fair    OT Frequency  2x / week    OT Duration  4 weeks    OT Treatment/Interventions  Fluidtherapy;Self-care/ADL training;Splinting;Therapeutic exercise;Paraffin;Manual Therapy;Passive range of motion    Plan  Assess progress in HEP and possible fabricate hand base pan splint to extend digits for night time     Clinical Decision Making  Several treatment options, min-mod task modification necessary    OT Home Exercise Plan  see pt instruction     Consulted and Agree with Plan of Care  Patient       Patient will benefit from skilled therapeutic intervention in order to improve the following deficits and impairments:  Impaired flexibility, Pain, Decreased coordination, Decreased range of motion, Decreased strength, Impaired UE functional use  Visit Diagnosis: Pain in right wrist - Plan: Ot plan of care cert/re-cert  Stiffness of right hand, not elsewhere classified - Plan: Ot plan of care cert/re-cert  Stiffness of right wrist, not elsewhere classified - Plan: Ot plan of care cert/re-cert  Muscle weakness (generalized) - Plan: Ot plan of care cert/re-cert    Problem List Patient Active Problem List   Diagnosis Date Noted  . Microscopic hematuria 10/18/2015  . Advance directive discussed with patient 10/13/2014  . Neuropathy (HCC) 08/01/2012  . Hyperthyroidism   . Routine general medical examination at a health care facility 05/09/2011  . Episodic mood disorder (HCC) 12/19/2006  . Essential hypertension, benign 12/19/2006  . RBBB 12/19/2006  . ALLERGIC RHINITIS 12/19/2006  . GERD 12/19/2006  . DIVERTICULOSIS, COLON 12/19/2006    Oletta Cohn OTR/L,CLT 09/27/2017, 6:53 PM  [facility] PHYSICAL AND SPORTS MEDICINE [address] [place], [address] Phone: [phone]   Fax:  [phone]  Name: Alexandria Sherman MRN: [id number] Date of Birth: Mar 13, 1946

## 2017-09-27 NOTE — Patient Instructions (Signed)
TUG- 30.5 seconds with 2 pillows underneath chair   5x sit to stand - 20.20 with 2 pillows underneath chair

## 2017-09-27 NOTE — Therapy (Signed)
[facility] PHYS[facility]ORTS MEDICINE [address], [place], [address] Phone: [phone]   Fax:  [phone]  Physical Therapy Evaluation  Patient Details  Name: Alexandria Sherman MRN: [id number] Date of Birth: May 23, 1946 Referring Provider: Dr. Martha Clan   Encounter Date: 09/27/2017  PT End of Session - 09/27/17 1700    Visit Number  1    Number of Visits  25    Date for PT Re-Evaluation  12/20/17    Authorization Type  1/10    PT Start Time  1435    PT Stop Time  1530    PT Time Calculation (min)  55 min    Activity Tolerance  Patient limited by fatigue    Behavior During Therapy  [facility] for tasks assessed/performed;Anxious       Past Medical History:  Diagnosis Date  . Anxiety disorder, unspecified   . Depression 1978  . Diverticulosis of colon 04/2005  . GERD (gastroesophageal reflux disease)   . Hypertension   . Hyperthyroidism    Dr Tedd Sias  . Impaired fasting glucose   . Low TSH level    chronic low, but euthyroid  . Microscopic hematuria   . RBBB   . Sleep disturbance   . Subclinical hyperthyroidism    unspecified  . Tibia fracture 05/2010   left    Past Surgical History:  Procedure Laterality Date  . ABDOMINAL HYSTERECTOMY  1988   hysterectomy/ BSO for endometriosis  . BREAST BIOPSY Right 1992   neg  . BREAST EXCISIONAL BIOPSY Right 1988   benign per pt  . KNEE ARTHROSCOPY  6/12   left--Dr Charlann Boxer    There were no vitals filed for this visit.   Subjective Assessment - 09/27/17 1440    Subjective  Patient reports roughly 5 years ago she fell and broke her L tibia and tore her L meniscus (had surgery for both). Reports she uses rollator in the basement, and platform walker upstairs at home. She uses crutches when she is going out. Reports her knees were buckling, but haven't been recently. She has seen neurologist, endocrinologist, vestibular doctor with the only conclusion being she is weak. She walks laps at  home, and has had multiple episodes of PT (which she has not found to be helpful). Has recently had a R wrist fracture (seeing OT for this).     Limitations  House hold activities;Standing;Walking;Lifting    Patient Stated Goals  Would like to build her leg strength and stop falling.    Currently in Pain?  Yes    Pain Score  5     Pain Location  Wrist    Pain Orientation  Right    Pain Descriptors / Indicators  Tender    Pain Onset  More than a month ago    Pain Frequency  Intermittent         [facility] PT Assessment - 09/27/17 1444      Assessment   Medical Diagnosis  Leg weakness    Referring Provider  Dr. Martha Clan      Precautions   Precautions  Other (comment);Fall      Restrictions   Weight Bearing Restrictions  Yes    RUE Weight Bearing  Weight bearing as tolerated      Balance Screen   Has the patient fallen in the past 6 months  Yes    How many times?  -- At least 1    Has the patient had a  decrease in activity level because of a fear of falling?   Yes    Is the patient reluctant to leave their home because of a fear of falling?   Yes      Home Environment   Living Environment  Private residence    Living Arrangements  Alone    Type of Home  House    Home Access  Level entry;Ramped entrance    Additional Comments  12 steps with stair lifts and a landing, 1 hand rail.       Prior Function   Level of Independence  Independent with household mobility with device    Vocation  Retired    Gaffer  -- Has recently been driving    Leisure  Largely sedentary, looking for help cleaning at home, has home health care      Cognition   Overall Cognitive Status  Within Functional Limits for tasks assessed      [employer]   Sit to Stand  Needs minimal aid to stand or to stabilize    Standing Unsupported  Able to stand safely 2 minutes    Sitting with Back Unsupported but Feet Supported on Floor or Stool  Able to sit safely and securely 2 minutes    Stand  to Sit  Uses backs of legs against chair to control descent    Transfers  Needs one person to assist    Standing Unsupported with Eyes Closed  Needs help to keep from falling    Standing Ubsupported with Feet Together  Able to place feet together independently and stand for 1 minute with supervision    From Standing, Reach Forward with Outstretched Arm  Reaches forward but needs supervision    From Standing Position, Pick up Object from Floor  Unable to pick up shoe, but reaches 2-5 cm (1-2") from shoe and balances independently    From Standing Position, Turn to Look Behind Over each Shoulder  Turn sideways only but maintains balance    Turn 360 Degrees  Needs assistance while turning    Standing Unsupported, Alternately Place Feet on Step/Stool  Needs assistance to keep from falling or unable to try    Standing Unsupported, One Foot in [employer] balance while stepping or standing    Standing on One Leg  Unable to try or needs assist to prevent fall    Total Score  20       TUG- 30.5 seconds with 2 pillows underneath chair   5x sit to stand - 20.20 with 2 pillows underneath chair    Attempted to educate and instruct patient on HEP to address noted LE weakness, while setting up mini squats patient was holding on to railing with a gait belt on. Therapist was bringing a chair behind her for safety, patient attempted exercise before therapist was ready and had both legs buckle underneath her. PT was able to help break the fall, however minor abrasions noted on R shin, treated with alcohol and band-aids. PT escorted patient to her vehicle to complete session, patient noted minor pain in the posterior part of her R knee but declined further examination or treatment.     Objective measurements completed on examination: See above findings.              PT Education - 09/27/17 1709    Education provided  Yes    Education Details  Call for help getting out of the car if she needs it  when she gets home.     Person(s) Educated  Patient    Methods  Explanation    Comprehension  Verbalized understanding          PT Long Term Goals - 09/27/17 1704      PT LONG TERM GOAL #1   Title  Patient will demonstrate a Berg Balance Score of at least 30/56 to demonstrate improved balance and decreased falls risk.    Baseline  20/56    Time  12    Period  Weeks    Status  New    Target Date  12/20/17      PT LONG TERM GOAL #2   Title  Patient will transfer sit to stand from a chair with no added pillows and use of UEs to demonstrate improved LE strength and reduced falls risk.     Baseline  Requires 2 pillows underneath and heavy use of LUE to complete transfer     Time  12    Period  Weeks    Status  New    Target Date  12/20/17      PT LONG TERM GOAL #3   Title  Patient will demonstrate a TUG of less than 20 seconds with no pillows in her chair and use of UEs and AD to demonstrate reduced falls risk.     Baseline  >30" with 2 pillows underneath her in chair.     Time  12    Period  Weeks    Status  New    Target Date  12/20/17      PT LONG TERM GOAL #4   Title  Patient will report no falls x 1 week at home to demonstrate improved safety with home mobility.     Baseline  Patient reports multiple falls every week.     Time  12    Period  Weeks    Status  New    Target Date  12/20/17             Plan - 09/27/17 1708    Clinical Impression Statement  Patient is a pleasant 72 y/o female that presents with bilateral LE weakness and multiple falls at home. She has had a history of fracuted L tibia and more recently a R wrist fracture after a fall, which she was just evaluated by Occupational Therapy today. No explicit cause for falls, her presentation in clinic today is a significant falls risk (Berg Balance score of 20/56), and requires substantial assistance to provide enough extension force to elevate herself off of a chair (2 pillows and use of UEs).  Incidentally while educating patient on HEP, PT was setting up a chair behind patient while she was holding on to railing, before therapist could put his hands on gait belt for optimal safety patient had initiated mini squat and had both LEs buckle, PT was able to slow her descent and she suffered minor abrasions on her R shin. At that point all treatment/evaluation was ceased, she reported minor pain in the back of her knee while ambulating to her car assisted by therapist, but no further buckling episodes noted. Patient is at a very high falls risk as evidenced by her history and this evaluation and would likely benefit from skilled PT services to address her falls risk.     Clinical Presentation  Unstable    Clinical Decision Making  High    Rehab Potential  Fair    PT Frequency  2x /  week    PT Duration  12 weeks    PT Treatment/Interventions  Balance training;Neuromuscular re-education;Gait training;DME Instruction;Aquatic Therapy;Cryotherapy;Electrical Stimulation;Ultrasound;Moist Heat;Stair training;Therapeutic activities;Therapeutic exercise;Patient/family education;Manual techniques;Energy conservation    PT Next Visit Plan  Bed level exercises    PT Home Exercise Plan  None provided    Consulted and Agree with Plan of Care  Patient       Patient will benefit from skilled therapeutic intervention in order to improve the following deficits and impairments:  Abnormal gait, Pain, Decreased balance, Decreased strength, Decreased activity tolerance, Difficulty walking  Visit Diagnosis: Muscle weakness (generalized)  Falls frequently  Balance disorder  G-Codes - 05-Oct-2017 1702    Functional Assessment Tool Used (Outpatient Only)  TUG, 5x sit to stand, BERG    Functional Limitation  Mobility: Walking and moving around    Mobility: Walking and Moving Around Current Status [phone])  At least 60 percent but less than 80 percent impaired, limited or restricted    Mobility: Walking and Moving  Around Goal Status [phone])  At least 20 percent but less than 40 percent impaired, limited or restricted        Problem List Patient Active Problem List   Diagnosis Date Noted  . Microscopic hematuria 10/18/2015  . Advance directive discussed with patient 10/13/2014  . Neuropathy (HCC) 08/01/2012  . Hyperthyroidism   . Routine general medical examination at a health care facility 05/09/2011  . Episodic mood disorder (HCC) 12/19/2006  . Essential hypertension, benign 12/19/2006  . RBBB 12/19/2006  . ALLERGIC RHINITIS 12/19/2006  . GERD 12/19/2006  . DIVERTICULOSIS, COLON 12/19/2006   Alva Garnet PT, DPT, CSCS    10-05-17, 5:30 PM  [facility] PHYSICAL AND SPORTS MEDICINE [address] [place], [address] Phone: [phone]   Fax:  [phone]  Name: Alexandria Sherman MRN: [id number] Date of Birth: March 24, 1946

## 2017-09-27 NOTE — Patient Instructions (Signed)
1 lbs weight for sup and pronation on lap AAROM for UD and RD hand together  Isometric for wrist extention  10 reps each   PROM and stretch for 1 min x 2 for PIP/DIP flexion stretch - end digit AROM blocked instrinsic fist all digits  Place and hold composite flexion - focus on 2nd digit and keep thumb to side   2-3 x day

## 2017-10-01 ENCOUNTER — Other Ambulatory Visit: Payer: Self-pay

## 2017-10-01 ENCOUNTER — Ambulatory Visit: Payer: Medicare Other

## 2017-10-01 ENCOUNTER — Ambulatory Visit: Payer: Medicare Other | Admitting: Occupational Therapy

## 2017-10-01 DIAGNOSIS — M6281 Muscle weakness (generalized): Secondary | ICD-10-CM

## 2017-10-01 DIAGNOSIS — M25531 Pain in right wrist: Secondary | ICD-10-CM

## 2017-10-01 DIAGNOSIS — R2689 Other abnormalities of gait and mobility: Secondary | ICD-10-CM

## 2017-10-01 DIAGNOSIS — M25631 Stiffness of right wrist, not elsewhere classified: Secondary | ICD-10-CM

## 2017-10-01 DIAGNOSIS — M25641 Stiffness of right hand, not elsewhere classified: Secondary | ICD-10-CM

## 2017-10-01 NOTE — Therapy (Signed)
[place] [facility] [facility] [address], [place], [address] Phone: [phone]   Fax:  [phone]  Occupational Therapy Treatment  Patient Details  Name: Alexandria Sherman MRN: [id number] Date of Birth: 05-26-1946 No Data Recorded  Encounter Date: 10/01/2017  OT End of Session - 10/01/17 1652    Visit Number  2    Number of Visits  8    Date for OT Re-Evaluation  10/25/17    OT Start Time  1130    OT Stop Time  1236    OT Time Calculation (min)  66 min    Activity Tolerance  Patient tolerated treatment well    Behavior During Therapy  [facility] for tasks assessed/performed;Anxious       Past Medical History:  Diagnosis Date  . Anxiety disorder, unspecified   . Depression 1978  . Diverticulosis of colon 04/2005  . GERD (gastroesophageal reflux disease)   . Hypertension   . Hyperthyroidism    Dr Tedd Sias  . Impaired fasting glucose   . Low TSH level    chronic low, but euthyroid  . Microscopic hematuria   . RBBB   . Sleep disturbance   . Subclinical hyperthyroidism    unspecified  . Tibia fracture 05/2010   left    Past Surgical History:  Procedure Laterality Date  . ABDOMINAL HYSTERECTOMY  1988   hysterectomy/ BSO for endometriosis  . BREAST BIOPSY Right 1992   neg  . BREAST EXCISIONAL BIOPSY Right 1988   benign per pt  . KNEE ARTHROSCOPY  6/12   left--Dr Charlann Boxer    There were no vitals filed for this visit.  Subjective Assessment - 10/01/17 1649    Subjective   Doing okay - you would need to go over those exercises again -  I do not know if I did them correctly - no pain - only tenderness at wrist if squeezing it     Patient Stated Goals  I want to use my R dominant hand to pick up object, write , hold book , hold glass , and have more strength     Currently in Pain?  No/denies       REview with pt HEP again - needed mod A and had hard time doing PROM and stretches for PIP/DIP flexion of 2nd , blocked PIP  flexion and composite flexion stretch and place and hold  Review and pt to cont with it   assess strength in thumb PA and RA - 3/5  done place and hold for thumb PA and RA with rubber band  8-10 reps Add to HEP   Assess wrist AROM   Add small football 1 lbs weight - with strap around hand - to do for wrist extention - with gravity eliminated - can only do partial over edge of chair - but then rotate into sup or drop into flexoin  Sup and pronation on lap  RD and UD - hands together - leading with R  10 reps   2 x day all HEP   Fabricated Volar hand base splint to use night time - for extention of 4th and 5th digits Kept 2nd and 3rd out of splint  Ed on precautions and to bring to clinic next session -for adjustments if needed     Still appear to have subluxation of 4th and 5th extention tendon over MC's  OT Education - 10/01/17 1652    Education provided  Yes    Education Details  splint wearing and HEP upgrade     Person(s) Educated  Patient    Methods  Explanation;Demonstration;Tactile cues;Verbal cues;Handout    Comprehension  Verbalized understanding;Need further instruction;Returned demonstration       OT Short Term Goals - 09/27/17 1839      OT SHORT TERM GOAL #1   Title  Pt to be ind in HEP to increase R  2nd digit flexion,  extention of 3rd - strength in pinch grip and increase strength at wrist to improve functional use     Baseline  see flowsheet     Time  2    Period  Weeks    Status  New    Target Date  10/11/17      OT SHORT TERM GOAL #2   Title  R wrist strength increase for pt to be able to use 1 lbs weight in all planes to be able to carry glass, cut food, stabilize wrist using AD     Baseline  Pt cannot hold of lift 1 lbs weight for wrist ext, during RD and UD - wrist collapse, and during pronation wrist collapse    Time  4    Period  Weeks    Status  New    Target Date  10/25/17        OT Long Term Goals - 09/27/17  1844      OT LONG TERM GOAL #1   Title  Pt to show increase lat and 3 point pinch to write, do buttons wiith more ease     Baseline  thumb decrease ROM , and strength - flexion of 2nd decrease and extention of 3rd PIP     Time  4    Period  Weeks    Status  New    Target Date  10/25/17      OT LONG TERM GOAL #2   Title  Pain and tenderness at R wrist decrease to 2/10 at the worse     Baseline  Tenderness at wrist per pt 5/10     Time  4    Period  Weeks    Status  New    Target Date  10/25/17            Plan - 10/01/17 1653    Clinical Impression Statement  Pt needed review of HEP this date to do PROM to 2nd digit flexion correctly - did show increase PIP flexion - was able to add 1 lbs weight to wrist - but in football shape - for pt to have strap around hand and eliminating gripping weight-  also need strengthening of thumb PA and RA - to keep thumb out of palm to encourage increase flexion of 2nd digit - pt made aware to flex 2nd during full grasp     Occupational performance deficits (Please refer to evaluation for details):  ADL's;IADL's;Play;Leisure    Rehab Potential  Fair    OT Frequency  2x / week    OT Duration  4 weeks    OT Treatment/Interventions  Fluidtherapy;Self-care/ADL training;Splinting;Therapeutic exercise;Paraffin;Manual Therapy;Passive range of motion    Plan  assess use of splint and progress with HEP - and make upgrades or adjustments     Clinical Decision Making  Several treatment options, min-mod task modification necessary    OT Home Exercise Plan  see pt instruction     Consulted  and Agree with Plan of Care  Patient       Patient will benefit from skilled therapeutic intervention in order to improve the following deficits and impairments:  Impaired flexibility, Pain, Decreased coordination, Decreased range of motion, Decreased strength, Impaired UE functional use  Visit Diagnosis: Pain in right wrist  Stiffness of right hand, not elsewhere  classified  Stiffness of right wrist, not elsewhere classified  Muscle weakness (generalized)    Problem List Patient Active Problem List   Diagnosis Date Noted  . Microscopic hematuria 10/18/2015  . Advance directive discussed with patient 10/13/2014  . Neuropathy (HCC) 08/01/2012  . Hyperthyroidism   . Routine general medical examination at a health care facility 05/09/2011  . Episodic mood disorder (HCC) 12/19/2006  . Essential hypertension, benign 12/19/2006  . RBBB 12/19/2006  . ALLERGIC RHINITIS 12/19/2006  . GERD 12/19/2006  . DIVERTICULOSIS, COLON 12/19/2006    Oletta CohnuPreez, Amritpal Shropshire OTR/L,CLT  10/01/2017, 4:56 PM  [facility] [address] [place], [place], [address] Phone: [phone]   Fax:  [phone]  Name: Alexandria Sherman MRN: [id number] Date of Birth: 08-24-1946

## 2017-10-01 NOTE — Therapy (Signed)
[facility] [address], [place], [address] Phone: [phone]   Fax:  [phone]  Physical Therapy Treatment  Patient Details  Name: Alexandria Sherman MRN: [id number] Date of Birth: 1946-06-07 Referring Provider: Dr. Martha Clan   Encounter Date: 10/01/2017  PT End of Session - 10/01/17 1025    Visit Number  2    Number of Visits  25    Date for PT Re-Evaluation  12/20/17    PT Start Time  1030    PT Stop Time  1115    PT Time Calculation (min)  45 min    Equipment Utilized During Treatment  Gait belt    Activity Tolerance  Patient limited by fatigue    Behavior During Therapy  [facility] for tasks assessed/performed;Anxious       Past Medical History:  Diagnosis Date  . Anxiety disorder, unspecified   . Depression 1978  . Diverticulosis of colon 04/2005  . GERD (gastroesophageal reflux disease)   . Hypertension   . Hyperthyroidism    Dr Tedd Sias  . Impaired fasting glucose   . Low TSH level    chronic low, but euthyroid  . Microscopic hematuria   . RBBB   . Sleep disturbance   . Subclinical hyperthyroidism    unspecified  . Tibia fracture 05/2010   left    Past Surgical History:  Procedure Laterality Date  . ABDOMINAL HYSTERECTOMY  1988   hysterectomy/ BSO for endometriosis  . BREAST BIOPSY Right 1992   neg  . BREAST EXCISIONAL BIOPSY Right 1988   benign per pt  . KNEE ARTHROSCOPY  6/12   left--Dr Charlann Boxer    There were no vitals filed for this visit.  Subjective Assessment - 10/01/17 1024    Subjective  Pt denies pain currently but reports some tenderness in her right wrist. She denies any problems from the episode of her legs buckling during evaluation. She does states that she was sore in her quadriceps after the evaluation. No specific questions or concerns at this time.     Limitations  House hold activities;Standing;Walking;Lifting    Patient Stated Goals  Would like to build her leg strength  and stop falling.    Currently in Pain?  No/denies    Pain Location  --    Pain Orientation  --    Pain Descriptors / Indicators  --    Pain Onset  --    Pain Frequency  --             TREATMENT  Ther-ex  Seated hip flexion with manual resistance 2 x 10; Seated hip abduction with manual resistance 2 x 10; Seated hip adduction with manual resistance 2 x 10; Seated LAQ with manual resistance 2 x 10; Seated HS curls with manual resistance 2 x 10; Seated heel raises with manual resistance 2 x 10; Hooklying marching 2 x 10; Seated clams with YTB x 10 for instruction in HEP; Sit to stand from black mat table at 60cm height (trial and error to find correct height) 2 x 5; Pt provided written HEP with verbal instruction;                    PT Education - 10/01/17 1025    Education provided  Yes    Education Details  exercise form/technique/safety    Person(s) Educated  Patient    Methods  Explanation    Comprehension  Verbalized understanding  PT Long Term Goals - 09/27/17 1704      PT LONG TERM GOAL #1   Title  Patient will demonstrate a Berg Balance Score of at least 30/56 to demonstrate improved balance and decreased falls risk.    Baseline  20/56    Time  12    Period  Weeks    Status  New    Target Date  12/20/17      PT LONG TERM GOAL #2   Title  Patient will transfer sit to stand from a chair with no added pillows and use of UEs to demonstrate improved LE strength and reduced falls risk.     Baseline  Requires 2 pillows underneath and heavy use of LUE to complete transfer     Time  12    Period  Weeks    Status  New    Target Date  12/20/17      PT LONG TERM GOAL #3   Title  Patient will demonstrate a TUG of less than 20 seconds with no pillows in her chair and use of UEs and AD to demonstrate reduced falls risk.     Baseline  >30" with 2 pillows underneath her in chair.     Time  12    Period  Weeks    Status  New    Target  Date  12/20/17      PT LONG TERM GOAL #4   Title  Patient will report no falls x 1 week at home to demonstrate improved safety with home mobility.     Baseline  Patient reports multiple falls every week.     Time  12    Period  Weeks    Status  New    Target Date  12/20/17            Plan - 10/01/17 1025    Clinical Impression Statement  Pt is able to tolerate all exercises today without excessive fatigue. She demonstrate significant hip flexion and quadricep weakness during exercises. She is able to perform sit to stand from elevated mat table at 60cm height. Pt provided written HEP which includes seated clams, hooklying bridges, and hooklying marches. Pt instructed to monitor fatigue and let therapist know about exercise intensity at next session. Pt encouraged to follow-up as scheduled.     Clinical Decision Making  High    Rehab Potential  Fair    PT Frequency  2x / week    PT Duration  12 weeks    PT Treatment/Interventions  Balance training;Neuromuscular re-education;Gait training;DME Instruction;Aquatic Therapy;Cryotherapy;Electrical Stimulation;Ultrasound;Moist Heat;Stair training;Therapeutic activities;Therapeutic exercise;Patient/family education;Manual techniques;Energy conservation    PT Next Visit Plan  Supine, sitting, and standing exercise progression    PT Home Exercise Plan  Seated clams with YTB 2 x 10 BID, hooklying bridges 2 x 10 BID, Hooklying marches 2 x 10 BID;    Consulted and Agree with Plan of Care  Patient       Patient will benefit from skilled therapeutic intervention in order to improve the following deficits and impairments:  Abnormal gait, Pain, Decreased balance, Decreased strength, Decreased activity tolerance, Difficulty walking  Visit Diagnosis: Muscle weakness (generalized)  Balance disorder     Problem List Patient Active Problem List   Diagnosis Date Noted  . Microscopic hematuria 10/18/2015  . Advance directive discussed with patient  10/13/2014  . Neuropathy (HCC) 08/01/2012  . Hyperthyroidism   . Routine general medical examination at a health care facility 05/09/2011  .  Episodic mood disorder (HCC) 12/19/2006  . Essential hypertension, benign 12/19/2006  . RBBB 12/19/2006  . ALLERGIC RHINITIS 12/19/2006  . GERD 12/19/2006  . DIVERTICULOSIS, COLON 12/19/2006   Lynnea MaizesJason D Jazmyn Offner PT, DPT   Hindy Perrault 10/01/2017, 1:02 PM  [place] [facility]IC[facility] [address] , [place], [address] Phone: [phone]   Fax:  [phone]  Name: [name] MRN: [id number] Date of Birth: 1945/12/22

## 2017-10-01 NOTE — Patient Instructions (Signed)
Same tendon glides  add place and hold for thumb PA and RA with rubber band  8-10 reps  Add small football 1 lbs weight - with strap around hand - to do for wrist extention - with gravity eliminated  Sup and pronation on lap  RD and UD - hands together - leading with R  10 reps   2 x day all HEP  Volar hand base splint to use night time - for extention of 4th and 5th digits Ed on precautions and to bring to clinic next session -for adjustments if needed

## 2017-10-04 ENCOUNTER — Ambulatory Visit: Payer: Medicare Other | Admitting: Occupational Therapy

## 2017-10-04 ENCOUNTER — Ambulatory Visit: Payer: Medicare Other

## 2017-10-04 ENCOUNTER — Other Ambulatory Visit: Payer: Self-pay

## 2017-10-04 DIAGNOSIS — M25631 Stiffness of right wrist, not elsewhere classified: Secondary | ICD-10-CM

## 2017-10-04 DIAGNOSIS — M25641 Stiffness of right hand, not elsewhere classified: Secondary | ICD-10-CM

## 2017-10-04 DIAGNOSIS — R296 Repeated falls: Secondary | ICD-10-CM

## 2017-10-04 DIAGNOSIS — M6281 Muscle weakness (generalized): Secondary | ICD-10-CM

## 2017-10-04 DIAGNOSIS — M25531 Pain in right wrist: Secondary | ICD-10-CM

## 2017-10-04 DIAGNOSIS — R2689 Other abnormalities of gait and mobility: Secondary | ICD-10-CM

## 2017-10-04 NOTE — Therapy (Signed)
[place] [facility] [address] [place], [place], [address] Phone: [phone]   Fax:  [phone]  Occupational Therapy Treatment  Patient Details  Name: Alexandria ScheuermannDonna S Sherman MRN: [id number] Date of Birth: [birth year] Referring Provider: Dr. Martha ClanKrasinski   Encounter Date: 10/04/2017  OT End of Session - 10/04/17 1347    Visit Number  3    Number of Visits  8    Date for OT Re-Evaluation  10/25/17    OT Start Time  1125    OT Stop Time  1210    OT Time Calculation (min)  45 min    Activity Tolerance  Patient tolerated treatment well    Behavior During Therapy  [facility] for tasks assessed/performed;Anxious       Past Medical History:  Diagnosis Date  . Anxiety disorder, unspecified   . Depression 1978  . Diverticulosis of colon 04/2005  . GERD (gastroesophageal reflux disease)   . Hypertension   . Hyperthyroidism    Dr Tedd SiasSolum  . Impaired fasting glucose   . Low TSH level    chronic low, but euthyroid  . Microscopic hematuria   . RBBB   . Sleep disturbance   . Subclinical hyperthyroidism    unspecified  . Tibia fracture 05/2010   left    Past Surgical History:  Procedure Laterality Date  . ABDOMINAL HYSTERECTOMY  1988   hysterectomy/ BSO for endometriosis  . BREAST BIOPSY Right 1992   neg  . BREAST EXCISIONAL BIOPSY Right 1988   benign per pt  . KNEE ARTHROSCOPY  6/12   left--Dr Charlann Boxerlin    There were no vitals filed for this visit.  Subjective Assessment - 10/04/17 1345    Subjective   I think my  pinkie and ringfinger are not as much into my palm anymore - think the splint is working - I can wear it for about 2/3 of night time- but I don't know about the index and with weight - you need to go over again     Patient Stated Goals  I want to use my R dominant hand to pick up object, write , hold book , hold glass , and have more strength     Currently in Pain?  No/denies         [facility] - 10/04/17 0001       Right Hand AROM   R Index PIP 0-100  75 Degrees    R Little  MCP 0-90  -- -60    R Little PIP 0-100  -- -55        REview with pt HEP again - needed mod A with wrist extention , and RD, UD exercises with weight  And again PROM and stretches for PIP/DIP flexion of 2nd , Fabricated flexion strap for DIP/PIP flexion stretch - pt ed on donning and wearing - and to wear 2 x 1 min  Followed by  blocked PIP flexion and then  composite flexion stretch and place and hold ( needed mod A )      place and hold for thumb PA and RA with rubber band - needed min A for correctly doing PA 8-10 reps    small football 1 lbs weight - with strap around hand - to do for wrist extention - with gravity eliminated -review and able to do 10-12 reps    Sup and pronation on lap  RD and UD - hands together - leading with  R  10 reps   2 x day all HEP   Check on Volar hand base splint to use night time - for extention of 4th and 5th digits Kept 2nd and 3rd out of splint  =some moldskin add at radial edge in palm  Ed on precautions and to bring to clinic next session -for adjustments if needed   Still appear to have subluxation of 4th and 5th extention tendon over MC's                  OT Education - 10/04/17 1346    Education provided  Yes    Education Details  review HEP with 1 lbs foot ball again -and ed on use of DIP/PIP flexion strap    Person(s) Educated  Patient    Methods  Explanation;Demonstration;Tactile cues;Verbal cues;Handout    Comprehension  Verbalized understanding;Returned demonstration       OT Short Term Goals - 09/27/17 1839      OT SHORT TERM GOAL #1   Title  Pt to be ind in HEP to increase R  2nd digit flexion,  extention of 3rd - strength in pinch grip and increase strength at wrist to improve functional use     Baseline  see flowsheet     Time  2    Period  Weeks    Status  New    Target Date  10/11/17      OT SHORT TERM GOAL #2   Title  R wrist  strength increase for pt to be able to use 1 lbs weight in all planes to be able to carry glass, cut food, stabilize wrist using AD     Baseline  Pt cannot hold of lift 1 lbs weight for wrist ext, during RD and UD - wrist collapse, and during pronation wrist collapse    Time  4    Period  Weeks    Status  New    Target Date  10/25/17        OT Long Term Goals - 09/27/17 1844      OT LONG TERM GOAL #1   Title  Pt to show increase lat and 3 point pinch to write, do buttons wiith more ease     Baseline  thumb decrease ROM , and strength - flexion of 2nd decrease and extention of 3rd PIP     Time  4    Period  Weeks    Status  New    Target Date  10/25/17      OT LONG TERM GOAL #2   Title  Pain and tenderness at R wrist decrease to 2/10 at the worse     Baseline  Tenderness at wrist per pt 5/10     Time  4    Period  Weeks    Status  New    Target Date  10/25/17            Plan - 10/04/17 1347    Clinical Impression Statement  Pt show increase R 2nd digit flexion at PIP , and extnetion of 5th PIP and MC - appear to be able to do 1 lbs football weight better with wrist - not doing wrist drop as much - will reassess next time  - pt do have hard time with doing HEP -need review the last time with some of them to do correctily - pt to cont with hand base extention splint for 4th and 5th digits  Occupational performance deficits (Please refer to evaluation for details):  ADL's;IADL's;Play;Leisure    Rehab Potential  Fair    OT Frequency  2x / week    OT Duration  4 weeks    OT Treatment/Interventions  Fluidtherapy;Self-care/ADL training;Splinting;Therapeutic exercise;Paraffin;Manual Therapy;Passive range of motion    Plan  assess wrist strength , and upgrade HEP as needed     Clinical Decision Making  Several treatment options, min-mod task modification necessary    OT Home Exercise Plan  see pt instruction     Consulted and Agree with Plan of Care  Patient       Patient  will benefit from skilled therapeutic intervention in order to improve the following deficits and impairments:  Impaired flexibility, Pain, Decreased coordination, Decreased range of motion, Decreased strength, Impaired UE functional use  Visit Diagnosis: Muscle weakness (generalized)  Pain in right wrist  Stiffness of right hand, not elsewhere classified  Stiffness of right wrist, not elsewhere classified    Problem List Patient Active Problem List   Diagnosis Date Noted  . Microscopic hematuria 10/18/2015  . Advance directive discussed with patient 10/13/2014  . Neuropathy (HCC) 08/01/2012  . Hyperthyroidism   . Routine general medical examination at a health care facility 05/09/2011  . Episodic mood disorder (HCC) 12/19/2006  . Essential hypertension, benign 12/19/2006  . RBBB 12/19/2006  . ALLERGIC RHINITIS 12/19/2006  . GERD 12/19/2006  . DIVERTICULOSIS, COLON 12/19/2006    Oletta Cohn OTR/L,CLT 10/04/2017, 1:51 PM  [place] [facility] PHYSICAL AND SPORTS MEDICINE [address], [place], [address] Phone: [phone]   Fax:  [phone]  Name: Alexandria Sherman MRN: [id number] Date of Birth: 1946-06-20

## 2017-10-04 NOTE — Patient Instructions (Signed)
Same HEP but add PIP/DIP strap for flexion to wear 2 x 1 min

## 2017-10-04 NOTE — Therapy (Signed)
[facility]ICINE [address] [place], [address] Phone: [phone]   Fax:  [phone]  Physical Therapy Treatment  Patient Details  Name: Alexandria Sherman MRN: [id number] Date of Birth: 09-Mar-1946 Referring Provider: Dr. Martha Clan   Encounter Date: 10/04/2017  PT End of Session - 10/04/17 1441    Visit Number  3    Number of Visits  25    Date for PT Re-Evaluation  12/20/17    PT Start Time  1030    PT Stop Time  1115    PT Time Calculation (min)  45 min    Equipment Utilized During Treatment  Gait belt    Activity Tolerance  Patient limited by fatigue    Behavior During Therapy  [facility] for tasks assessed/performed;Anxious       Past Medical History:  Diagnosis Date  . Anxiety disorder, unspecified   . Depression 1978  . Diverticulosis of colon 04/2005  . GERD (gastroesophageal reflux disease)   . Hypertension   . Hyperthyroidism    Dr Tedd Sias  . Impaired fasting glucose   . Low TSH level    chronic low, but euthyroid  . Microscopic hematuria   . RBBB   . Sleep disturbance   . Subclinical hyperthyroidism    unspecified  . Tibia fracture 05/2010   left    Past Surgical History:  Procedure Laterality Date  . ABDOMINAL HYSTERECTOMY  1988   hysterectomy/ BSO for endometriosis  . BREAST BIOPSY Right 1992   neg  . BREAST EXCISIONAL BIOPSY Right 1988   benign per pt  . KNEE ARTHROSCOPY  6/12   left--Dr Charlann Boxer    There were no vitals filed for this visit.  Subjective Assessment - 10/04/17 1440    Subjective  Pt reports she is having a rough morning today but does not elaborate further with questioning. She denies any pain currently. She is performing prescribed HEP without issue but reports that she is struggling to understand some of the OT exercises. No specific questions or concerns at this time.     Limitations  House hold activities;Standing;Walking;Lifting    Patient Stated Goals  Would like to  build her leg strength and stop falling.    Currently in Pain?  No/denies           TREATMENT  Ther-ex  Seated hip flexion with manual resistance 2 x 10; Seated hip abduction with manual resistance 2 x 10; Seated hip adduction with manual resistance 2 x 10; Seated LAQ with manual resistance 2 x 10; Seated HS curls with manual resistance 2 x 10; Seated heel raises with manual resistance 2 x 10; Sit to stand from black mat table at 63cm height 2 x 10, mat table elevated today due to increased difficulty performing; Increased repetitions for HEP to 15 each;                     PT Education - 10/04/17 1441    Education provided  Yes    Education Details  exercise form/technique, HEP progression.     Person(s) Educated  Patient    Methods  Explanation    Comprehension  Verbalized understanding          PT Long Term Goals - 09/27/17 1704      PT LONG TERM GOAL #1   Title  Patient will demonstrate a Berg Balance Score of at least 30/56 to demonstrate improved balance and decreased  falls risk.    Baseline  20/56    Time  12    Period  Weeks    Status  New    Target Date  12/20/17      PT LONG TERM GOAL #2   Title  Patient will transfer sit to stand from a chair with no added pillows and use of UEs to demonstrate improved LE strength and reduced falls risk.     Baseline  Requires 2 pillows underneath and heavy use of LUE to complete transfer     Time  12    Period  Weeks    Status  New    Target Date  12/20/17      PT LONG TERM GOAL #3   Title  Patient will demonstrate a TUG of less than 20 seconds with no pillows in her chair and use of UEs and AD to demonstrate reduced falls risk.     Baseline  >30" with 2 pillows underneath her in chair.     Time  12    Period  Weeks    Status  New    Target Date  12/20/17      PT LONG TERM GOAL #4   Title  Patient will report no falls x 1 week at home to demonstrate improved safety with home mobility.      Baseline  Patient reports multiple falls every week.     Time  12    Period  Weeks    Status  New    Target Date  12/20/17            Plan - 10/04/17 1442    Clinical Impression Statement  Pt with poor form/technique with sit to stand transfers today with therapist requiring cues for correction. She is able to progress repetitions for sit to stand but requires slightly higher mat table today. She is able to complete seated exercises today with notable RLE weakness compared to left. She denies pain during all exercises. Pt concerned about remembering her exercises correctly and requests increasing repetitions instead of adding new exercises today. Pt encouraged to increase repetitions to 15 for all of her HEP. Pt encouraged to follow-up as scheduled.     Rehab Potential  Fair    PT Frequency  2x / week    PT Duration  12 weeks    PT Treatment/Interventions  Balance training;Neuromuscular re-education;Gait training;DME Instruction;Aquatic Therapy;Cryotherapy;Electrical Stimulation;Ultrasound;Moist Heat;Stair training;Therapeutic activities;Therapeutic exercise;Patient/family education;Manual techniques;Energy conservation    PT Next Visit Plan  Supine, sitting, and standing exercise progression    PT Home Exercise Plan  Seated clams with YTB 2 x 15 BID, hooklying bridges 2 x 15 BID, Hooklying marches 2 x 15 BID;    Consulted and Agree with Plan of Care  Patient       Patient will benefit from skilled therapeutic intervention in order to improve the following deficits and impairments:  Abnormal gait, Pain, Decreased balance, Decreased strength, Decreased activity tolerance, Difficulty walking  Visit Diagnosis: Muscle weakness (generalized)  Balance disorder  Falls frequently     Problem List Patient Active Problem List   Diagnosis Date Noted  . Microscopic hematuria 10/18/2015  . Advance directive discussed with patient 10/13/2014  . Neuropathy (HCC) 08/01/2012  .  Hyperthyroidism   . Routine general medical examination at a health care facility 05/09/2011  . Episodic mood disorder (HCC) 12/19/2006  . Essential hypertension, benign 12/19/2006  . RBBB 12/19/2006  . ALLERGIC RHINITIS 12/19/2006  . GERD  12/19/2006  . DIVERTICULOSIS, COLON 12/19/2006   Lynnea MaizesJason D Huprich PT, DPT   Huprich,Jason 10/04/2017, 2:47 PM  [facility] [address] , [place], [address] Phone: [phone]   Fax:  [phone]  Name: Alexandria Sherman MRN: [id number] Date of Birth: 1946/03/06

## 2017-10-09 ENCOUNTER — Ambulatory Visit: Payer: Medicare Other | Admitting: Occupational Therapy

## 2017-10-09 ENCOUNTER — Ambulatory Visit: Payer: Medicare Other

## 2017-10-09 DIAGNOSIS — M6281 Muscle weakness (generalized): Secondary | ICD-10-CM

## 2017-10-09 DIAGNOSIS — M25641 Stiffness of right hand, not elsewhere classified: Secondary | ICD-10-CM

## 2017-10-09 DIAGNOSIS — R2689 Other abnormalities of gait and mobility: Secondary | ICD-10-CM

## 2017-10-09 DIAGNOSIS — M25531 Pain in right wrist: Secondary | ICD-10-CM

## 2017-10-09 DIAGNOSIS — M25631 Stiffness of right wrist, not elsewhere classified: Secondary | ICD-10-CM

## 2017-10-09 NOTE — Patient Instructions (Signed)
Same HEP - but add isometric wrist extention to HEP -to do 5 x day at least - 2 x 10 reps

## 2017-10-09 NOTE — Therapy (Signed)
[facility] PHYS[facility]TS [facility]ICINE [address] [place], [place], [address] Phone: [phone]   Fax:  [phone]  [facility]  Patient Details  Name: Alexandria ScheuermannDonna S Sherman MRN: [id number] Date of Birth: [birth year] Referring Provider: Dr. Martha ClanKrasinski   Encounter Date: 10/09/2017  OT End of Session - 10/09/17 1200    Visit Number  4    Number of Visits  8    Date for OT Re-Evaluation  10/25/17    OT Start Time  1118    OT Stop Time  1153    OT Time Calculation (min)  35 min    Activity Tolerance  Patient tolerated treatment well    Behavior During Therapy  [facility] for tasks assessed/performed;Anxious       Past Medical History:  Diagnosis Date  . Anxiety disorder, unspecified   . Depression 1978  . Diverticulosis of colon 04/2005  . GERD (gastroesophageal reflux disease)   . Hypertension   . Hyperthyroidism    Dr Tedd SiasSolum  . Impaired fasting glucose   . Low TSH level    chronic low, but euthyroid  . Microscopic hematuria   . RBBB   . Sleep disturbance   . Subclinical hyperthyroidism    unspecified  . Tibia fracture 05/2010   left    Past Surgical History:  Procedure Laterality Date  . ABDOMINAL HYSTERECTOMY  1988   hysterectomy/ BSO for endometriosis  . BREAST BIOPSY Right 1992   neg  . BREAST EXCISIONAL BIOPSY Right 1988   benign per pt  . KNEE ARTHROSCOPY  6/12   left--Dr Charlann Boxerlin    There were no vitals filed for this visit.  Subjective Assessment - 10/09/17 1158    Subjective   I am not doing to great this am - lost my keys - had to look everywhere and found it at last - the football weight break all the strings of my bag - but I do not have weight     Patient Stated Goals  I want to use my R dominant hand to pick up object, write , hold book , hold glass , and have more strength     Currently in Pain?  No/denies         [facility] - 10/09/17 0001      Right Hand AROM   R Index PIP 0-100  80  Degrees       Assess 2nd digit flexion and extention of 4th and 5th  See flowsheet Progressing in 2nd flexion at PIP  Cont with PIP/DIP strap flexion  place and hold for thumb PA and RA with rubber band - needed min A for correctly doing PA 8-10 reps   assess strength in wrist - still 3/5 against gravity  Cannot do 1 lbs weight  Attempted for pt to use 1 lbs weight at home instead of football to encourage gripping with 2nd digit - pt do not have 1 lbs weight wants to use this week  small football 1 lbs weight - with strap around hand - to do for wrist extention - with gravity eliminated-review and able to do 10-12 reps   Sup and pronation on lap  RD and UD - hands together - leading with R  10 reps  2 x day all HEP   Ed pt on how wrist extention weakness affect her gripping , pick up objects and hand shake   Isometric add to HEP for wrist extnetion -  to do 2 x 10 reps sets , 5 x day  Review several times with pt   Still appear to have subluxation of 4th and 5th extention tendon over MC's                 OT Education - 10/09/17 1159    Education provided  Yes    Education Details  isometric wrist extnetion -and ed on wrist extention weakness in function    Person(s) Educated  Patient    Methods  Explanation;Demonstration    Comprehension  Verbalized understanding;Returned demonstration       OT Short Term Goals - 09/27/17 1839      OT SHORT TERM GOAL #1   Title  Pt to be ind in HEP to increase R  2nd digit flexion,  extention of 3rd - strength in pinch grip and increase strength at wrist to improve functional use     Baseline  see flowsheet     Time  2    Period  Weeks    Status  New    Target Date  10/11/17      OT SHORT TERM GOAL #2   Title  R wrist strength increase for pt to be able to use 1 lbs weight in all planes to be able to carry glass, cut food, stabilize wrist using AD     Baseline  Pt cannot hold of lift 1 lbs weight for wrist  ext, during RD and UD - wrist collapse, and during pronation wrist collapse    Time  4    Period  Weeks    Status  New    Target Date  10/25/17        OT Long Term Goals - 09/27/17 1844      OT LONG TERM GOAL #1   Title  Pt to show increase lat and 3 point pinch to write, do buttons wiith more ease     Baseline  thumb decrease ROM , and strength - flexion of 2nd decrease and extention of 3rd PIP     Time  4    Period  Weeks    Status  New    Target Date  10/25/17      OT LONG TERM GOAL #2   Title  Pain and tenderness at R wrist decrease to 2/10 at the worse     Baseline  Tenderness at wrist per pt 5/10     Time  4    Period  Weeks    Status  New    Target Date  10/25/17            Plan - 10/09/17 1200    Clinical Impression Statement  Pt cont to show increase 2nd digit PIP flexion in R hand -but 4th and 5th extention about the same - with wearing night splint - pt cont to show decrease wrist extention - limiting her gripping and carrying or picking up of object - pt need a lot of review of HEP - wants pt to do 1 lbs weight to grip with digits - but  report shw want to use this week the football 1 lbs      Occupational performance deficits (Please refer to evaluation for details):  ADL's;IADL's;Play;Leisure    Rehab Potential  Fair    OT Frequency  2x / week    OT Duration  2 weeks    OT Treatment/Interventions  Fluidtherapy;Self-care/ADL training;Splinting;Therapeutic exercise;Paraffin;Manual Therapy;Passive range of motion  Plan  wrist extention focus on , assess lat and 3 point grip     Clinical Decision Making  Several treatment options, min-mod task modification necessary    OT Home Exercise Plan  see pt instruction     Consulted and Agree with Plan of Care  Patient       Patient will benefit from skilled therapeutic intervention in order to improve the following deficits and impairments:  Impaired flexibility, Pain, Decreased coordination, Decreased range of  motion, Decreased strength, Impaired UE functional use  Visit Diagnosis: Muscle weakness (generalized)  Pain in right wrist  Stiffness of right hand, not elsewhere classified  Stiffness of right wrist, not elsewhere classified    Problem List Patient Active Problem List   Diagnosis Date Noted  . Microscopic hematuria 10/18/2015  . Advance directive discussed with patient 10/13/2014  . Neuropathy (HCC) 08/01/2012  . Hyperthyroidism   . Routine general medical examination at a health care facility 05/09/2011  . Episodic mood disorder (HCC) 12/19/2006  . Essential hypertension, benign 12/19/2006  . RBBB 12/19/2006  . ALLERGIC RHINITIS 12/19/2006  . GERD 12/19/2006  . DIVERTICULOSIS, COLON 12/19/2006    Oletta Cohn OTR/L,CLT  10/09/2017, 12:02 PM  [place] [facility] PHYSICAL AND SPORTS MEDICINE [address], [place], [address] Phone: [phone]   Fax:  [phone]  Name: Alexandria Sherman MRN: [id number] Date of Birth: December 09, 1945

## 2017-10-09 NOTE — Therapy (Signed)
[facility]IC[facility] [address] [place], [place], [address] Phone: [phone]   Fax:  [phone]  Physical Therapy Treatment  Patient Details  Name: Alexandria Sherman MRN: [id number] Date of Birth: [birth year] Referring Provider: Dr. Martha ClanKrasinski   Encounter Date: 10/09/2017  PT End of Session - 10/09/17 1041    Visit Number  4    Number of Visits  25    Date for PT Re-Evaluation  12/20/17    PT Start Time  1035    PT Stop Time  1115    PT Time Calculation (min)  40 min    Equipment Utilized During Treatment  Gait belt    Activity Tolerance  Patient limited by fatigue    Behavior During Therapy  [facility] for tasks assessed/performed;Anxious       Past Medical History:  Diagnosis Date  . Anxiety disorder, unspecified   . Depression 1978  . Diverticulosis of colon 04/2005  . GERD (gastroesophageal reflux disease)   . Hypertension   . Hyperthyroidism    Dr Tedd SiasSolum  . Impaired fasting glucose   . Low TSH level    chronic low, but euthyroid  . Microscopic hematuria   . RBBB   . Sleep disturbance   . Subclinical hyperthyroidism    unspecified  . Tibia fracture 05/2010   left    Past Surgical History:  Procedure Laterality Date  . ABDOMINAL HYSTERECTOMY  1988   hysterectomy/ BSO for endometriosis  . BREAST BIOPSY Right 1992   neg  . BREAST EXCISIONAL BIOPSY Right 1988   benign per pt  . KNEE ARTHROSCOPY  6/12   left--Dr Charlann Boxerlin    There were no vitals filed for this visit.  Subjective Assessment - 10/09/17 1038    Subjective  Pt states she is having a "rough morning." She lost the keys to her car and had to spend an hour looking for them. She denies pain at this time. No specific questions or concerns. She is performing HEP and has increased the repetitions to 15 for each exercise.     Limitations  House hold activities;Standing;Walking;Lifting    Patient Stated Goals  Would like to build her leg strength and stop  falling.    Currently in Pain?  No/denies           TREATMENT  Ther-ex Seated hip flexion with manual resistance 2 x 15; Seated hip abduction with manual resistance 2 x 15; Seated hip adduction with manual resistance 2 x 10; Seated LAQ with manual resistance 2 x 10; Seated HS curls with manual resistance 2 x 10; Seated heel raises with manual resistance 2 x 10; Sit to stand from black mat table at 63cm height 2 x 10, pt continues to require cues for proper form/technique with sit to stand; Standing mini squats with rolling walker in front and mat table behind 2 x 10; Hooklying SLR 2 x 5 bilateral, assist required on RLE; Attempted sidelying hip abduction however pt is too weak to perform against gravity;             PT Education - 10/09/17 1040    Education provided  Yes    Education Details  exercise form/technique, HEP reinforced    Person(s) Educated  Patient    Methods  Explanation    Comprehension  Verbalized understanding          PT Long Term Goals - 09/27/17 1704      PT LONG  TERM GOAL #1   Title  Patient will demonstrate a Berg Balance Score of at least 30/56 to demonstrate improved balance and decreased falls risk.    Baseline  20/56    Time  12    Period  Weeks    Status  New    Target Date  12/20/17      PT LONG TERM GOAL #2   Title  Patient will transfer sit to stand from a chair with no added pillows and use of UEs to demonstrate improved LE strength and reduced falls risk.     Baseline  Requires 2 pillows underneath and heavy use of LUE to complete transfer     Time  12    Period  Weeks    Status  New    Target Date  12/20/17      PT LONG TERM GOAL #3   Title  Patient will demonstrate a TUG of less than 20 seconds with no pillows in her chair and use of UEs and AD to demonstrate reduced falls risk.     Baseline  >30" with 2 pillows underneath her in chair.     Time  12    Period  Weeks    Status  New    Target Date  12/20/17       PT LONG TERM GOAL #4   Title  Patient will report no falls x 1 week at home to demonstrate improved safety with home mobility.     Baseline  Patient reports multiple falls every week.     Time  12    Period  Weeks    Status  New    Target Date  12/20/17            Plan - 10/09/17 1041    Clinical Impression Statement  Pt continues to demonstrate significant LE weakness on this date but slight improvement noted in hip flexion. She does continue to require assist for R SLR in hooklying position and is unable to perform sidelying hip abduction against gravity. No pain reported during session. HEP not progressed on this date but will be advanced next date if pt continues to be consistent at home. Pt encouraged to continue HEP and follow-up as scheduled.     Rehab Potential  Fair    PT Frequency  2x / week    PT Duration  12 weeks    PT Treatment/Interventions  Balance training;Neuromuscular re-education;Gait training;DME Instruction;Aquatic Therapy;Cryotherapy;Electrical Stimulation;Ultrasound;Moist Heat;Stair training;Therapeutic activities;Therapeutic exercise;Patient/family education;Manual techniques;Energy conservation    PT Next Visit Plan  Supine, sitting, and standing exercise progression    PT Home Exercise Plan  Seated clams with YTB 2 x 15 BID, hooklying bridges 2 x 15 BID, Hooklying marches 2 x 15 BID;    Consulted and Agree with Plan of Care  Patient       Patient will benefit from skilled therapeutic intervention in order to improve the following deficits and impairments:  Abnormal gait, Pain, Decreased balance, Decreased strength, Decreased activity tolerance, Difficulty walking  Visit Diagnosis: Muscle weakness (generalized)  Balance disorder     Problem List Patient Active Problem List   Diagnosis Date Noted  . Microscopic hematuria 10/18/2015  . Advance directive discussed with patient 10/13/2014  . Neuropathy (HCC) 08/01/2012  . Hyperthyroidism   .  Routine general medical examination at a health care facility 05/09/2011  . Episodic mood disorder (HCC) 12/19/2006  . Essential hypertension, benign 12/19/2006  . RBBB 12/19/2006  . ALLERGIC  RHINITIS 12/19/2006  . GERD 12/19/2006  . DIVERTICULOSIS, COLON 12/19/2006   Alexandria Sherman PT, DPT   Melessa Cowell 10/09/2017, 2:27 PM  [facility] PHYSICAL AND SPORTS MEDICINE [address] [place], [address] Phone: [phone]   Fax:  [phone]  Name: BRIANTE LOVEALL MRN: [id number] Date of Birth: [birth year]

## 2017-10-11 ENCOUNTER — Ambulatory Visit: Payer: Medicare Other | Admitting: Physical Therapy

## 2017-10-11 ENCOUNTER — Ambulatory Visit: Payer: Medicare Other | Admitting: Occupational Therapy

## 2017-10-11 ENCOUNTER — Encounter: Payer: Self-pay | Admitting: Physical Therapy

## 2017-10-11 DIAGNOSIS — M25631 Stiffness of right wrist, not elsewhere classified: Secondary | ICD-10-CM

## 2017-10-11 DIAGNOSIS — M6281 Muscle weakness (generalized): Secondary | ICD-10-CM

## 2017-10-11 DIAGNOSIS — M25531 Pain in right wrist: Secondary | ICD-10-CM

## 2017-10-11 DIAGNOSIS — R2689 Other abnormalities of gait and mobility: Secondary | ICD-10-CM

## 2017-10-11 DIAGNOSIS — R296 Repeated falls: Secondary | ICD-10-CM

## 2017-10-11 DIAGNOSIS — M25641 Stiffness of right hand, not elsewhere classified: Secondary | ICD-10-CM

## 2017-10-11 NOTE — Therapy (Signed)
[facility]INE [address], [place], [address] Phone: [phone]   Fax:  [phone]  Occupational Therapy Treatment  Patient Details  Name: Alexandria Sherman MRN: [id number] Date of Birth: [birth year] Referring Provider: Dr. Martha Clan   Encounter Date: 10/11/2017  OT End of Session - 10/11/17 1952    Visit Number  5    Number of Visits  8    Date for OT Re-Evaluation  10/25/17    OT Start Time  1030    OT Stop Time  1115    OT Time Calculation (min)  45 min    Activity Tolerance  Patient tolerated treatment well    Behavior During Therapy  [facility] for tasks assessed/performed;Anxious       Past Medical History:  Diagnosis Date  . Anxiety disorder, unspecified   . Depression 1978  . Diverticulosis of colon 04/2005  . GERD (gastroesophageal reflux disease)   . Hypertension   . Hyperthyroidism    Dr Tedd Sias  . Impaired fasting glucose   . Low TSH level    chronic low, but euthyroid  . Microscopic hematuria   . RBBB   . Sleep disturbance   . Subclinical hyperthyroidism    unspecified  . Tibia fracture 05/2010   left    Past Surgical History:  Procedure Laterality Date  . ABDOMINAL HYSTERECTOMY  1988   hysterectomy/ BSO for endometriosis  . BREAST BIOPSY Right 1992   neg  . BREAST EXCISIONAL BIOPSY Right 1988   benign per pt  . KNEE ARTHROSCOPY  6/12   left--Dr Charlann Boxer    There were no vitals filed for this visit.  Subjective Assessment - 10/11/17 1949    Subjective   I have appt with Dr Kirtland Bouchard on Tuesday - still cannot open my R hand and it does not work as good as before my fall     Patient Stated Goals  I want to use my R dominant hand to pick up object, write , hold book , hold glass , and have more strength     Currently in Pain?  No/denies         [facility] OT Assessment - 10/11/17 0001      Strength   Right Hand Grip (lbs)  8    Right Hand Lateral Pinch  2 lbs    Right Hand 3 Point Pinch  --  unable     Left Hand Grip (lbs)  15    Left Hand Lateral Pinch  3 lbs    Left Hand 3 Point Pinch  4 lbs         Assess 2nd digit flexion and extention of 4th and 5th  Still same as last time  Progressing in 2nd flexion at PIP  Cont with PIP/DIP strap flexion  assess grip and prehension strenght  assess strength in wrist - still 3/5 against gravity  Cannot do 1 lbs weight  Attempted for pt to use 1 lbs weight at home instead of football to encourage gripping with 2nd digit - pt do not have 1 lbs weight wants to use this week  small football 1 lbs weight - with strap around hand - to do for wrist extention - with gravity eliminated-review and able to do 10-12 reps  Pt ask what 3 things to focus on  Gripping putty - provided teal putty fro gripping bilateral hands Wrist extention - in neutral position And isometric wrist extention  Ed pt on how wrist extention weakness affect her gripping , pick up objects and hand shake  As will as in length discuss weakness bilaterally - wrist extention  And grip and prehension  Unable to do 3 point with thumb on L too   I Still appear to have subluxation of 4th and 5th extention tendon over MC's Pt to discuss concerns with MD                     OT Education - 10/11/17 1951    Education provided  Yes    Education Details  HEP , discuss  findings with assessment - possible subluxation of 4th and 5th extention tendon - and bilateral wrist extention, grip and prehension weakness     Person(s) Educated  Patient    Methods  Explanation;Demonstration;Tactile cues;Verbal cues;Handout    Comprehension  Returned demonstration;Need further instruction;Verbalized understanding       OT Short Term Goals - 09/27/17 1839      OT SHORT TERM GOAL #1   Title  Pt to be ind in HEP to increase R  2nd digit flexion,  extention of 3rd - strength in pinch grip and increase strength at wrist to improve functional use     Baseline   see flowsheet     Time  2    Period  Weeks    Status  New    Target Date  10/11/17      OT SHORT TERM GOAL #2   Title  R wrist strength increase for pt to be able to use 1 lbs weight in all planes to be able to carry glass, cut food, stabilize wrist using AD     Baseline  Pt cannot hold of lift 1 lbs weight for wrist ext, during RD and UD - wrist collapse, and during pronation wrist collapse    Time  4    Period  Weeks    Status  New    Target Date  10/25/17        OT Long Term Goals - 09/27/17 1844      OT LONG TERM GOAL #1   Title  Pt to show increase lat and 3 point pinch to write, do buttons wiith more ease     Baseline  thumb decrease ROM , and strength - flexion of 2nd decrease and extention of 3rd PIP     Time  4    Period  Weeks    Status  New    Target Date  10/25/17      OT LONG TERM GOAL #2   Title  Pain and tenderness at R wrist decrease to 2/10 at the worse     Baseline  Tenderness at wrist per pt 5/10     Time  4    Period  Weeks    Status  New    Target Date  10/25/17            Plan - 10/11/17 1953    Clinical Impression Statement  Pt cont to need review of HEP - pt do show increase R 2nd digit PIP flexion , but no progress in 4thand 5th digit exention -with wearing extention hand splint - ? subluxation of tendon - pt grip in R and L decrease and collapse into flexion bilateral - also decrease lat and 3 point grip  in bilateral hands- arthritis changes - pt to see MD on Tuesday     Occupational performance  deficits (Please refer to evaluation for details):  ADL's;IADL's;Play;Leisure    Rehab Potential  Fair    OT Frequency  2x / week    OT Duration  2 weeks    OT Treatment/Interventions  Fluidtherapy;Self-care/ADL training;Splinting;Therapeutic exercise;Paraffin;Manual Therapy;Passive range of motion    Plan  results from MD visit     Clinical Decision Making  Several treatment options, min-mod task modification necessary    OT Home Exercise Plan   see pt instruction     Consulted and Agree with Plan of Care  Patient       Patient will benefit from skilled therapeutic intervention in order to improve the following deficits and impairments:  Impaired flexibility, Pain, Decreased coordination, Decreased range of motion, Decreased strength, Impaired UE functional use  Visit Diagnosis: Muscle weakness (generalized)  Pain in right wrist  Stiffness of right hand, not elsewhere classified  Stiffness of right wrist, not elsewhere classified    Problem List Patient Active Problem List   Diagnosis Date Noted  . Microscopic hematuria 10/18/2015  . Advance directive discussed with patient 10/13/2014  . Neuropathy (HCC) 08/01/2012  . Hyperthyroidism   . Routine general medical examination at a health care facility 05/09/2011  . Episodic mood disorder (HCC) 12/19/2006  . Essential hypertension, benign 12/19/2006  . RBBB 12/19/2006  . ALLERGIC RHINITIS 12/19/2006  . GERD 12/19/2006  . DIVERTICULOSIS, COLON 12/19/2006    Oletta CohnuPreez, Lise Pincus OTR/L,CLT 10/11/2017, 8:02 PM  [place] [facility] SPORTS MEDICINE [address] [place], [place], [address] Phone: [phone]   Fax:  [phone]  Name: Garnette ScheuermannDonna S Seda MRN: [id number] Date of Birth: [birth year]

## 2017-10-11 NOTE — Patient Instructions (Signed)
Add this date to HEP teall putty for gripping bilateral  Cont with focus on isometric for wrist extention  And 1 lbs football to grip and do wrist extention sideways  2 x day   pt to get 1 lbs weight over weekend

## 2017-10-11 NOTE — Therapy (Signed)
[place] [facility] [address], [place], [address] Phone: [phone]   Fax:  [phone]  Physical Therapy Treatment  Patient Details  Name: Alexandria Sherman MRN: [id number] Date of Birth: 12-23-1945 Referring Provider: Dr. Martha Clan   Encounter Date: 10/11/2017  PT End of Session - 10/11/17 0948    Visit Number  5    Number of Visits  25    Date for PT Re-Evaluation  12/20/17    PT Start Time  0948    PT Stop Time  1028    PT Time Calculation (min)  40 min    Equipment Utilized During Treatment  Gait belt    Activity Tolerance  Patient limited by fatigue    Behavior During Therapy  [facility] for tasks assessed/performed;Anxious       Past Medical History:  Diagnosis Date  . Anxiety disorder, unspecified   . Depression 1978  . Diverticulosis of colon 04/2005  . GERD (gastroesophageal reflux disease)   . Hypertension   . Hyperthyroidism    Dr Tedd Sias  . Impaired fasting glucose   . Low TSH level    chronic low, but euthyroid  . Microscopic hematuria   . RBBB   . Sleep disturbance   . Subclinical hyperthyroidism    unspecified  . Tibia fracture 05/2010   left    Past Surgical History:  Procedure Laterality Date  . ABDOMINAL HYSTERECTOMY  1988   hysterectomy/ BSO for endometriosis  . BREAST BIOPSY Right 1992   neg  . BREAST EXCISIONAL BIOPSY Right 1988   benign per pt  . KNEE ARTHROSCOPY  6/12   left--Dr Charlann Boxer    There were no vitals filed for this visit.  Subjective Assessment - 10/11/17 0953    Subjective  Pt is feeling discouraged because she has not seen any gains yet.  Otherwise no new complaints or concerns.     Limitations  House hold activities;Standing;Walking;Lifting    Patient Stated Goals  Would like to build her leg strength and stop falling.    Currently in Pain?  No/denies       Ther-ex  Seated hip flexion with 2# ankle weights 2 x 15 Seated hip isometrics with belt with 5 second  holds 2x10  Seated hip adduction ball squeezes x5 second isometric holds x10 Seated LAQ with 2# ankle weights 2x10 each LE Seated HS curls with manual resistance 2 x 10 Sit<>stand from elevated black mat table 2x10.  Pt braces posterior legs agianst mat table.  Technique improves with cues to bring hips forward once upright. Standing Bil hip Abd 2x10 with BUEs supported Standing mini squats with rolling walker in front and mat table behind 2 x 10; Hooklying SLR 2 x 5 bilateral, assist required on RLE                         PT Education - 10/11/17 0948    Education provided  Yes    Education Details  Exercise technique; discussed timeline to see change in muscle growth    Person(s) Educated  Patient    Methods  Explanation;Demonstration;Verbal cues    Comprehension  Verbalized understanding;Returned demonstration;Verbal cues required;Need further instruction          PT Long Term Goals - 09/27/17 1704      PT LONG TERM GOAL #1   Title  Patient will demonstrate a Berg Balance Score of at least 30/56 to  demonstrate improved balance and decreased falls risk.    Baseline  20/56    Time  12    Period  Weeks    Status  New    Target Date  12/20/17      PT LONG TERM GOAL #2   Title  Patient will transfer sit to stand from a chair with no added pillows and use of UEs to demonstrate improved LE strength and reduced falls risk.     Baseline  Requires 2 pillows underneath and heavy use of LUE to complete transfer     Time  12    Period  Weeks    Status  New    Target Date  12/20/17      PT LONG TERM GOAL #3   Title  Patient will demonstrate a TUG of less than 20 seconds with no pillows in her chair and use of UEs and AD to demonstrate reduced falls risk.     Baseline  >30" with 2 pillows underneath her in chair.     Time  12    Period  Weeks    Status  New    Target Date  12/20/17      PT LONG TERM GOAL #4   Title  Patient will report no falls x 1 week at  home to demonstrate improved safety with home mobility.     Baseline  Patient reports multiple falls every week.     Time  12    Period  Weeks    Status  New    Target Date  12/20/17            Plan - 10/11/17 1005    Clinical Impression Statement  Pt expresses feelings of sadness as she is afraid of becoming homebound.  Pt denies any suicidal thoughts or ideations and declines a referral to assist with this.  Pt with poor tolerance to very mild progression of strengthening exercises and requires encouragement throughout.  Educated pt on timeline to expect changes in strength and improvement of balance as pt has unrealistic expectation of immediate improvement.  Pt will benefit form continued skilled PT interventions for improved strength and balance.     Rehab Potential  Fair    PT Frequency  2x / week    PT Duration  12 weeks    PT Treatment/Interventions  Balance training;Neuromuscular re-education;Gait training;DME Instruction;Aquatic Therapy;Cryotherapy;Electrical Stimulation;Ultrasound;Moist Heat;Stair training;Therapeutic activities;Therapeutic exercise;Patient/family education;Manual techniques;Energy conservation    PT Next Visit Plan  Supine, sitting, and standing exercise progression    PT Home Exercise Plan  Seated clams with YTB 2 x 15 BID, hooklying bridges 2 x 15 BID, Hooklying marches 2 x 15 BID;    Consulted and Agree with Plan of Care  Patient       Patient will benefit from skilled therapeutic intervention in order to improve the following deficits and impairments:  Abnormal gait, Pain, Decreased balance, Decreased strength, Decreased activity tolerance, Difficulty walking  Visit Diagnosis: Muscle weakness (generalized)  Balance disorder  Falls frequently     Problem List Patient Active Problem List   Diagnosis Date Noted  . Microscopic hematuria 10/18/2015  . Advance directive discussed with patient 10/13/2014  . Neuropathy (HCC) 08/01/2012  .  Hyperthyroidism   . Routine general medical examination at a health care facility 05/09/2011  . Episodic mood disorder (HCC) 12/19/2006  . Essential hypertension, benign 12/19/2006  . RBBB 12/19/2006  . ALLERGIC RHINITIS 12/19/2006  . GERD 12/19/2006  . DIVERTICULOSIS,  COLON 12/19/2006    Encarnacion ChuAshley Sayyid Harewood PT, DPT 10/11/2017, 10:26 AM  [place] [facility] [address] [place], [place], [address] Phone: [phone]   Fax:  [phone]  Name: Alexandria Sherman MRN: [id number] Date of Birth: August 03, 1946

## 2017-10-16 ENCOUNTER — Ambulatory Visit: Payer: Medicare Other | Admitting: Occupational Therapy

## 2017-10-18 ENCOUNTER — Ambulatory Visit: Payer: Medicare Other | Admitting: Occupational Therapy

## 2017-10-18 ENCOUNTER — Ambulatory Visit: Payer: Medicare Other

## 2017-10-19 ENCOUNTER — Encounter: Payer: Self-pay | Admitting: Internal Medicine

## 2017-10-22 ENCOUNTER — Encounter: Payer: Self-pay | Admitting: Internal Medicine

## 2017-10-22 ENCOUNTER — Ambulatory Visit (INDEPENDENT_AMBULATORY_CARE_PROVIDER_SITE_OTHER): Payer: Medicare Other | Admitting: Internal Medicine

## 2017-10-22 VITALS — BP 130/88 | HR 90 | Temp 98.1°F | Ht 63.0 in | Wt 150.0 lb

## 2017-10-22 DIAGNOSIS — F39 Unspecified mood [affective] disorder: Secondary | ICD-10-CM | POA: Diagnosis not present

## 2017-10-22 DIAGNOSIS — K219 Gastro-esophageal reflux disease without esophagitis: Secondary | ICD-10-CM | POA: Diagnosis not present

## 2017-10-22 DIAGNOSIS — Z0000 Encounter for general adult medical examination without abnormal findings: Secondary | ICD-10-CM

## 2017-10-22 DIAGNOSIS — Z7189 Other specified counseling: Secondary | ICD-10-CM

## 2017-10-22 DIAGNOSIS — I10 Essential (primary) hypertension: Secondary | ICD-10-CM

## 2017-10-22 DIAGNOSIS — G629 Polyneuropathy, unspecified: Secondary | ICD-10-CM

## 2017-10-22 DIAGNOSIS — Z23 Encounter for immunization: Secondary | ICD-10-CM

## 2017-10-22 DIAGNOSIS — Z1211 Encounter for screening for malignant neoplasm of colon: Secondary | ICD-10-CM

## 2017-10-22 LAB — HEMOGLOBIN A1C: Hgb A1c MFr Bld: 5.9 % (ref 4.6–6.5)

## 2017-10-22 MED ORDER — CLORAZEPATE DIPOTASSIUM 3.75 MG PO TABS: 3.7500 mg | ORAL_TABLET | Freq: Three times a day (TID) | ORAL | 0 refills | Status: DC | PRN

## 2017-10-22 NOTE — Patient Instructions (Signed)
I would suggest trying both the venlafaxine and the depakote as twice a day.

## 2017-10-22 NOTE — Assessment & Plan Note (Signed)
Has DNR 

## 2017-10-22 NOTE — Assessment & Plan Note (Signed)
Chronic anxiety and some dysthymia Due to try different regimen--she will update my via email

## 2017-10-22 NOTE — Progress Notes (Signed)
Hearing Screening (Inadequate exam)   Method: Audiometry   125Hz  250Hz  500Hz  1000Hz  2000Hz  3000Hz  4000Hz  6000Hz  8000Hz   Right ear:   40 0 40  0    Left ear:   40 0 0  0    Vision Screening Comments: August 2018

## 2017-10-22 NOTE — Assessment & Plan Note (Signed)
BP Readings from Last 3 Encounters:  10/22/17 130/88  08/13/17 (!) 144/90  07/22/17 125/71   Good control No changes

## 2017-10-22 NOTE — Telephone Encounter (Signed)
Pt says the directions on her bottle say 1 tablet 3 times a day. She says she takes 1 in the day and 2 in the pm. She is asking for a 90 day supply. She has an OV today with us 11:30. The previous quantity was for #60 for 30 days, not #90 for 30 days.

## 2017-10-22 NOTE — Progress Notes (Signed)
Subjective:    Patient ID: Alexandria Sherman, female    DOB: 06/25/1946, 72 y.o.   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 Working on converting to walk in tub, raising toilet  Stopping the fluoxetine Did start the depakote and effexor yet--but has them (Rx when at rehab) Interested in trying the mood stabilizer combo Reviewed these are both probably bid  No chest pain except when she was leaning over garbage can looking for missing keys No dizziness or syncope Mild edema Breathing is okay  Slight tingling in her fingers  Ongoing bad nerves Uses the tranxene regularly at night and prn in AM Current Outpatient Medications on File Prior to Visit  Medication Sig Dispense Refill  . acetaminophen (TYLENOL) 325 MG tablet Take 650 mg every 6 (six) hours as needed by mouth.    Marland Kitchen aspirin EC 81 MG tablet Take 81 mg daily by mouth.    . calcium carbonate (OSCAL) 1500 (600 Ca) MG TABS tablet Take 600 mg of elemental calcium 2 (two) times daily by mouth.    . cholecalciferol (VITAMIN D) 1000 UNITS tablet Take 2,000 Units by mouth daily.     . clorazepate  (TRANXENE) 3.75 MG tablet Take 2 tablets (7.5 mg total) at bedtime by mouth. (Patient taking differently: Take 7.5 mg by mouth at bedtime. Pt takes 2 at bedtime and 1 daily as needed) 60 tablet 0  . glycerin adult 2 g suppository Place 1 suppository as needed rectally for constipation.    Marland Kitchen latanoprost (XALATAN) 0.005 % ophthalmic solution Place 1 drop at bedtime into both eyes.     . meclizine (ANTIVERT) 25 MG tablet Take 25 mg 3 (three) times daily as needed by mouth for dizziness.    . mometasone (ELOCON) 0.1 % cream Apply 1 application topically 2 (two) times daily as needed (itching). 45 g 0  . olopatadine (PATANOL) 0.1 % ophthalmic solution Place 1 drop 2 (two) times daily as needed into both eyes.     Marland Kitchen omeprazole (PRILOSEC) 20 MG capsule Take 20 mg daily by mouth.    . timolol (TIMOPTIC) 0.5 % ophthalmic solution Place 1 drop daily into both eyes.    . traMADol (ULTRAM) 50 MG tablet Take 1-2 tablets (50-100 mg total) every 4 (four) hours as needed by mouth. Give 1 tablet (50 mg) for mild pain and 2 tablets (100 mg) for moderate to severe pain 120 tablet 0  . triamterene-hydrochlorothiazide (MAXZIDE-25) 37.5-25 MG tablet Take 1 tablet daily as needed by mouth.    . divalproex (DEPAKOTE) 125 MG DR tablet Take 1 tablet (125 mg total) by mouth daily. (Patient not taking: Reported on 10/22/2017) 30 tablet 1  . venlafaxine (  EFFEXOR) 75 MG tablet Take 1 tablet (75 mg total) by mouth 2 (two) times daily. (Patient not taking: Reported on 10/22/2017) 30 tablet 0   No current facility-administered medications on file prior to visit.     Allergies  Allergen Reactions  . Cephalexin     REACTION: vaginal blisters Tolerates penicillins  . Codeine Phosphate     REACTION: vomiting  . Promethazine Hcl     REACTION: nerves    Past Medical History:  Diagnosis Date  . Anxiety disorder, unspecified   . Depression 1978  . Diverticulosis of colon 04/2005  . GERD (gastroesophageal reflux disease)   .  Hypertension   . Hyperthyroidism    Dr Tedd SiasSolum  . Impaired fasting glucose   . Low TSH level    chronic low, but euthyroid  . Microscopic hematuria   . RBBB   . Sleep disturbance   . Subclinical hyperthyroidism    unspecified  . Tibia fracture 05/2010   left    Past Surgical History:  Procedure Laterality Date  . ABDOMINAL HYSTERECTOMY  1988   hysterectomy/ BSO for endometriosis  . BREAST BIOPSY Right 1992   neg  . BREAST EXCISIONAL BIOPSY Right 1988   benign per pt  . KNEE ARTHROSCOPY  6/12   left--Dr Charlann Boxerlin    Family History  Problem Relation Age of Onset  . Cancer Mother        lung  . Diabetes Mother   . Depression Mother   . Cancer Sister        oral cancer  . Depression Sister   . Depression Sister   . Stroke Other   . Heart attack Other   . Cancer Other   . Dementia Other   . Breast cancer Neg Hx   . Thyroid disease Neg Hx     Social History   Socioeconomic History  . Marital status: Divorced    Spouse name: Not on file  . Number of children: 0  . Years of education: 16  . Highest education level: Bachelor's degree (e.g., BA, AB, BS)  Social Needs  . Financial resource strain: Not on file  . Food insecurity - worry: Not on file  . Food insecurity - inability: Not on file  . Transportation needs - medical: Not on file  . Transportation needs - non-medical: Not on file  Occupational History  . Occupation: Teacher--retired  Tobacco Use  . Smoking status: Never Smoker  . Smokeless tobacco: Never Used  Substance and Sexual Activity  . Alcohol use: Yes    Comment: Occasional  . Drug use: Not on file  . Sexual activity: Not on file  Other Topics Concern  . Not on file  Social History Narrative   No living will   No health care POA but requests both sisters to do this.    Now has DNR   No tube feeds if cognitively unaware   Review of Systems Teeth okay--keeps up with dentist Wears seat belt  Appetite went way down after her injury--improving  again now Lost 30# over that time Bowels are slow at times--uses miralax prn. No blood Pads for urinary leakage No heartburn on the omeprazole--usually only once a day. No dysphagia Easy bruising now--no distinct rash    Objective:   Physical Exam  Constitutional: She is oriented to person, place, and time. No distress.  HENT:  Mouth/Throat: Oropharynx is clear and moist. No oropharyngeal exudate.  Neck: No thyromegaly present.  Cardiovascular: Normal rate,  regular rhythm, normal heart sounds and intact distal pulses. Exam reveals no gallop.  No murmur heard. Pulmonary/Chest: Effort normal and breath sounds normal. No respiratory distress. She has no wheezes. She has no rales.  Abdominal: Soft. There is no tenderness.  Musculoskeletal: She exhibits no edema or tenderness.  Lymphadenopathy:    She has no cervical adenopathy.  Neurological: She is alert and oriented to person, place, and time.  President--- "Garnet Koyanagi, Obama, Bush" [phone] D-l-r-[id number]-w Recall 3/3  Marked weakness--can't step up on table without help          Assessment & Plan:

## 2017-10-22 NOTE — Addendum Note (Signed)
Addended by: Eual FinesBRIDGES, [name] P on: 10/22/2017 01:02 PM   Modules accepted: Orders

## 2017-10-22 NOTE — Assessment & Plan Note (Signed)
Mild sensory changes Will just check A1c at her request

## 2017-10-22 NOTE — Assessment & Plan Note (Signed)
I have personally reviewed the Medicare Annual Wellness questionnaire and have noted 1. The patient's medical and social history 2. Their use of alcohol, tobacco or illicit drugs 3. Their current medications and supplements 4. The patient's functional ability including ADL's, fall risks, home safety risks and hearing or visual             impairment. 5. Diet and physical activities 6. Evidence for depression or mood disorders  The patients weight, height, BMI and visual acuity have been recorded in the chart I have made referrals, counseling and provided education to the patient based review of the above and I have provided the pt with a written personalized care plan for preventive services.  I have provided you with a copy of your personalized plan for preventive services. Please take the time to review along with your updated medication list.  Recent mammogram FIT Pneumovax booster today Discussed fitness--notes sarcopenia

## 2017-10-22 NOTE — Assessment & Plan Note (Signed)
Okay on the omeprazole 

## 2017-10-23 ENCOUNTER — Ambulatory Visit: Payer: Medicare Other | Admitting: Occupational Therapy

## 2017-10-23 ENCOUNTER — Ambulatory Visit: Payer: Medicare Other

## 2017-10-25 ENCOUNTER — Ambulatory Visit: Payer: Medicare Other | Admitting: Physical Therapy

## 2017-10-25 ENCOUNTER — Ambulatory Visit: Payer: Medicare Other | Admitting: Occupational Therapy

## 2017-10-25 ENCOUNTER — Encounter: Payer: Self-pay | Admitting: Physical Therapy

## 2017-10-25 DIAGNOSIS — M25631 Stiffness of right wrist, not elsewhere classified: Secondary | ICD-10-CM

## 2017-10-25 DIAGNOSIS — M6281 Muscle weakness (generalized): Secondary | ICD-10-CM | POA: Diagnosis not present

## 2017-10-25 DIAGNOSIS — M25531 Pain in right wrist: Secondary | ICD-10-CM

## 2017-10-25 DIAGNOSIS — M25641 Stiffness of right hand, not elsewhere classified: Secondary | ICD-10-CM

## 2017-10-25 NOTE — Therapy (Signed)
[facility] [address] [place], [address] Phone: [phone]   Fax:  [phone]  Physical Therapy Treatment  Patient Details  Name: Alexandria Sherman MRN: [id number] Date of Birth: 1945/11/26 Referring Provider: Dr. Martha Clan   Encounter Date: 10/25/2017  PT End of Session - 10/25/17 1116    Visit Number  6    Number of Visits  25    Date for PT Re-Evaluation  12/20/17    Authorization Type  1/10    PT Start Time  1030    PT Stop Time  1115    PT Time Calculation (min)  45 min    Activity Tolerance  Patient limited by fatigue    Behavior During Therapy  [facility] for tasks assessed/performed;Anxious       Past Medical History:  Diagnosis Date  . Anxiety disorder, unspecified   . Depression 1978  . Diverticulosis of colon 04/2005  . GERD (gastroesophageal reflux disease)   . Hypertension   . Hyperthyroidism    Dr Tedd Sias  . Impaired fasting glucose   . Low TSH level    chronic low, but euthyroid  . Microscopic hematuria   . RBBB   . Sleep disturbance   . Subclinical hyperthyroidism    unspecified  . Tibia fracture 05/2010   left    Past Surgical History:  Procedure Laterality Date  . ABDOMINAL HYSTERECTOMY  1988   hysterectomy/ BSO for endometriosis  . BREAST BIOPSY Right 1992   neg  . BREAST EXCISIONAL BIOPSY Right 1988   benign per pt  . KNEE ARTHROSCOPY  6/12   left--Dr Charlann Boxer    There were no vitals filed for this visit.  Subjective Assessment - 10/25/17 1034    Subjective  Pt reports no pain. Patient reports she continues to feel very discouraged as she feels that she is not making any progress.     Limitations  House hold activities;Standing;Walking;Lifting    Patient Stated Goals  Would like to build her leg strength and stop falling.    Pain Onset  More than a month ago             Ther-Ex - 3x 10 seated bilat hip flexion w/ 2# ankle wts w/ continuous VC for eccentric  control - 3x 10 seated bilat LAQ w/ 2# with 2sec hold at full ext with VC for eccentric contol - 1x 10 2x 12  bridge exercise 75% ROM with cuing for glute contraction  - 3x 12 bilat clamshell exercise w/ VC for proper pelvic aligment - 2x 12 1x 15 OMEGA leg press 10# *frequent seated breaks taken during therex                       PT Long Term Goals - 09/27/17 1704      PT LONG TERM GOAL #1   Title  Patient will demonstrate a Berg Balance Score of at least 30/56 to demonstrate improved balance and decreased falls risk.    Baseline  20/56    Time  12    Period  Weeks    Status  New    Target Date  12/20/17      PT LONG TERM GOAL #2   Title  Patient will transfer sit to stand from a chair with no added pillows and use of UEs to demonstrate improved LE strength and reduced falls risk.     Baseline  Requires 2  pillows underneath and heavy use of LUE to complete transfer     Time  12    Period  Weeks    Status  New    Target Date  12/20/17      PT LONG TERM GOAL #3   Title  Patient will demonstrate a TUG of less than 20 seconds with no pillows in her chair and use of UEs and AD to demonstrate reduced falls risk.     Baseline  >30" with 2 pillows underneath her in chair.     Time  12    Period  Weeks    Status  New    Target Date  12/20/17      PT LONG TERM GOAL #4   Title  Patient will report no falls x 1 week at home to demonstrate improved safety with home mobility.     Baseline  Patient reports multiple falls every week.     Time  12    Period  Weeks    Status  New    Target Date  12/20/17            Plan - 10/25/17 1117    Clinical Impression Statement  Pt tolerated exercise progression well and was encouraged by progressed. Sitting down on OMEGA leg press patient reached for a grab bar that was too far from her and lost her balance but was able to sit properly with modA from PT to lower safely. Patient reported (she was fine) and "didn't mean to  scare PT, she was just sitting down". Patient was able to complete following exercise and ambulation with no pian noted.     Rehab Potential  Fair    PT Treatment/Interventions  Balance training;Neuromuscular re-education;Gait training;DME Instruction;Aquatic Therapy;Cryotherapy;Electrical Stimulation;Ultrasound;Moist Heat;Stair training;Therapeutic activities;Therapeutic exercise;Patient/family education;Manual techniques;Energy conservation    PT Home Exercise Plan  Seated clams with YTB 2 x 15 BID, hooklying bridges 2 x 15 BID, Hooklying marches 2 x 15 BID;       Patient will benefit from skilled therapeutic intervention in order to improve the following deficits and impairments:  Abnormal gait, Pain, Decreased balance, Decreased strength, Decreased activity tolerance, Difficulty walking  Visit Diagnosis: Muscle weakness (generalized)     Problem List Patient Active Problem List   Diagnosis Date Noted  . Microscopic hematuria 10/18/2015  . Advance directive discussed with patient 10/13/2014  . Neuropathy (HCC) 08/01/2012  . Hyperthyroidism   . Routine general medical examination at a health care facility 05/09/2011  . Episodic mood disorder (HCC) 12/19/2006  . Essential hypertension, benign 12/19/2006  . RBBB 12/19/2006  . ALLERGIC RHINITIS 12/19/2006  . GERD 12/19/2006  . DIVERTICULOSIS, COLON 12/19/2006    Staci Acostahelsea Miller 10/25/2017, 11:21 AM  [facility] [address] [place], [place], [id number] Phone: [phone]   Fax:  [phone]  Name: Alexandria Sherman MRN: [id number] Date of Birth: August 10, 1946

## 2017-10-25 NOTE — Therapy (Signed)
[facility] [address], [place], [address] Phone: [phone]   Fax:  [phone]  Occupational Therapy Treatment  Patient Details  Name: Alexandria Sherman MRN: [id number] Date of Birth: [birth year] Referring Provider: Dr. Martha Clan   Encounter Date: 10/25/2017  OT End of Session - 10/25/17 2000    Visit Number  6    Number of Visits  8    Date for OT Re-Evaluation  10/25/17    OT Start Time  1116    OT Stop Time  1200    OT Time Calculation (min)  44 min    Activity Tolerance  Patient tolerated treatment well    Behavior During Therapy  [facility] for tasks assessed/performed;Anxious       Past Medical History:  Diagnosis Date  . Anxiety disorder, unspecified   . Depression 1978  . Diverticulosis of colon 04/2005  . GERD (gastroesophageal reflux disease)   . Hypertension   . Hyperthyroidism    Dr Tedd Sias  . Impaired fasting glucose   . Low TSH level    chronic low, but euthyroid  . Microscopic hematuria   . RBBB   . Sleep disturbance   . Subclinical hyperthyroidism    unspecified  . Tibia fracture 05/2010   left    Past Surgical History:  Procedure Laterality Date  . ABDOMINAL HYSTERECTOMY  1988   hysterectomy/ BSO for endometriosis  . BREAST BIOPSY Right 1992   neg  . BREAST EXCISIONAL BIOPSY Right 1988   benign per pt  . KNEE ARTHROSCOPY  6/12   left--Dr Charlann Boxer    There were no vitals filed for this visit.  Subjective Assessment - 10/25/17 1956    Subjective   I seen Dr Kirtland Bouchard 2 wks ago and then had me seen hand surgeon - she had the therapist come in and then she put me on steriod and antiinflammatory - for swelling in my wrist and she things my ring and pinkie  tendons are rupture - need to see her in 6 wks     Patient Stated Goals  I want to use my R dominant hand to pick up object, write , hold book , hold glass , and have more strength     Currently in Pain?  No/denies         [facility] OT  Assessment - 10/25/17 0001      Strength   Right Hand Grip (lbs)  8    Left Hand Grip (lbs)  15            Progressing in 2nd flexion at PIP but not since 2 wks ago Grip and prehension still the same  Cont with PIP/DIP strap flexion Discuss with pt function and rotation of thumb making FMC harder and how to compensate  Ask how she can get more strength in pinch  Provided and review lat grip and 3 point and 2 point grip with putty  Gripping putty  To cont with - provided teal putty  Wrist extention - in neutral position And isometric wrist extention   Ed pt on how wrist extention weakness affect her gripping , pick up objects and hand shake  As will as in length discuss weakness bilaterally - wrist extention  And grip and prehension  Unable to do 3 point with thumb on L too   Pt has 2 lbs weight  Pt to do sup/pro supported on lap , UD and RD with hands  together but leading with R  And wrist extention on lap sideways  10 reps  Should not have any pain                OT Education - 10/25/17 1959    Education provided  Yes    Education Details  HEP review and up writs to 2 lbs , and putty for pinch and lat pinch -    Person(s) Educated  Patient    Methods  Explanation;Demonstration;Tactile cues;Handout    Comprehension  Returned demonstration;Verbalized understanding       OT Short Term Goals - 09/27/17 1839      OT SHORT TERM GOAL #1   Title  Pt to be ind in HEP to increase R  2nd digit flexion,  extention of 3rd - strength in pinch grip and increase strength at wrist to improve functional use     Baseline  see flowsheet     Time  2    Period  Weeks    Status  New    Target Date  10/11/17      OT SHORT TERM GOAL #2   Title  R wrist strength increase for pt to be able to use 1 lbs weight in all planes to be able to carry glass, cut food, stabilize wrist using AD     Baseline  Pt cannot hold of lift 1 lbs weight for wrist ext, during RD and UD - wrist  collapse, and during pronation wrist collapse    Time  4    Period  Weeks    Status  New    Target Date  10/25/17        OT Long Term Goals - 09/27/17 1844      OT LONG TERM GOAL #1   Title  Pt to show increase lat and 3 point pinch to write, do buttons wiith more ease     Baseline  thumb decrease ROM , and strength - flexion of 2nd decrease and extention of 3rd PIP     Time  4    Period  Weeks    Status  New    Target Date  10/25/17      OT LONG TERM GOAL #2   Title  Pain and tenderness at R wrist decrease to 2/10 at the worse     Baseline  Tenderness at wrist per pt 5/10     Time  4    Period  Weeks    Status  New    Target Date  10/25/17            Plan - 10/25/17 2000    Clinical Impression Statement   Pt arrive this date that she has Seen Dr  Kirtland BouchardK and he referred her to hand surgeon - that gave her medication ( steroid and something else) for edema in wrist and thought that 4th and 5th extensor tendons are rupture and need to return in 6 wks - later the therapist that work with Hydrographic surveyorhand surgeon phone this OT to discuss pt per hand surgeon - to work on ROM and function for thumb thru 3rd - see if edema decrease and willl see her in 6 wks     Occupational performance deficits (Please refer to evaluation for details):  ADL's;IADL's;Play;Leisure    Rehab Potential  Fair    OT Frequency  1x / week    OT Duration  4 weeks    OT Treatment/Interventions  Fluidtherapy;Self-care/ADL training;Splinting;Therapeutic exercise;Paraffin;Manual  Therapy;Passive range of motion    Clinical Decision Making  Several treatment options, min-mod task modification necessary    OT Home Exercise Plan  see pt instruction     Consulted and Agree with Plan of Care  Patient       Patient will benefit from skilled therapeutic intervention in order to improve the following deficits and impairments:  Impaired flexibility, Pain, Decreased coordination, Decreased range of motion, Decreased strength,  Impaired UE functional use  Visit Diagnosis: Muscle weakness (generalized)  Pain in right wrist  Stiffness of right hand, not elsewhere classified  Stiffness of right wrist, not elsewhere classified    Problem List Patient Active Problem List   Diagnosis Date Noted  . Microscopic hematuria 10/18/2015  . Advance directive discussed with patient 10/13/2014  . Neuropathy (HCC) 08/01/2012  . Hyperthyroidism   . Routine general medical examination at a health care facility 05/09/2011  . Episodic mood disorder (HCC) 12/19/2006  . Essential hypertension, benign 12/19/2006  . RBBB 12/19/2006  . ALLERGIC RHINITIS 12/19/2006  . GERD 12/19/2006  . DIVERTICULOSIS, COLON 12/19/2006    Oletta Cohn OTR/L,CLT 10/25/2017, 8:04 PM  [facility] SPORTS MEDICINE [address] [place], [address] Phone: [phone]   Fax:  [phone]  Name: Alexandria Sherman MRN: [id number] Date of Birth: Mar 08, 1946

## 2017-10-25 NOTE — Patient Instructions (Signed)
Increase to 2 lbs for wrist  Cont grip strength and same HEP for DIP and PIP flexion of 2nd  Add lat and 3 point grip with putty  Hand out provided

## 2017-10-30 ENCOUNTER — Encounter: Payer: Self-pay | Admitting: Physical Therapy

## 2017-10-30 ENCOUNTER — Ambulatory Visit: Payer: Medicare Other | Admitting: Occupational Therapy

## 2017-10-30 ENCOUNTER — Ambulatory Visit: Payer: Medicare Other | Attending: Orthopedic Surgery | Admitting: Physical Therapy

## 2017-10-30 DIAGNOSIS — M25641 Stiffness of right hand, not elsewhere classified: Secondary | ICD-10-CM | POA: Insufficient documentation

## 2017-10-30 DIAGNOSIS — M6281 Muscle weakness (generalized): Secondary | ICD-10-CM | POA: Insufficient documentation

## 2017-10-30 DIAGNOSIS — M25631 Stiffness of right wrist, not elsewhere classified: Secondary | ICD-10-CM | POA: Diagnosis present

## 2017-10-30 DIAGNOSIS — R2689 Other abnormalities of gait and mobility: Secondary | ICD-10-CM | POA: Insufficient documentation

## 2017-10-30 DIAGNOSIS — M25531 Pain in right wrist: Secondary | ICD-10-CM | POA: Insufficient documentation

## 2017-10-30 NOTE — Therapy (Signed)
[facility]TS MEDIC[facility] [address] [place], [address] Phone: [phone]   Fax:  [phone]  Physical Therapy Treatment  Patient Details  Name: Alexandria Sherman MRN: [id number] Date of Birth: 1946/06/14 Referring Provider: Dr. Martha Clan   Encounter Date: 10/30/2017  PT End of Session - 10/30/17 1128    Visit Number  7    Number of Visits  25    Date for PT Re-Evaluation  12/20/17    PT Start Time  1110    PT Stop Time  1155    PT Time Calculation (min)  45 min    Activity Tolerance  Patient limited by fatigue    Behavior During Therapy  [facility] for tasks assessed/performed;Anxious       Past Medical History:  Diagnosis Date  . Anxiety disorder, unspecified   . Depression 1978  . Diverticulosis of colon 04/2005  . GERD (gastroesophageal reflux disease)   . Hypertension   . Hyperthyroidism    Dr Tedd Sias  . Impaired fasting glucose   . Low TSH level    chronic low, but euthyroid  . Microscopic hematuria   . RBBB   . Sleep disturbance   . Subclinical hyperthyroidism    unspecified  . Tibia fracture 05/2010   left    Past Surgical History:  Procedure Laterality Date  . ABDOMINAL HYSTERECTOMY  1988   hysterectomy/ BSO for endometriosis  . BREAST BIOPSY Right 1992   neg  . BREAST EXCISIONAL BIOPSY Right 1988   benign per pt  . KNEE ARTHROSCOPY  6/12   left--Dr Charlann Boxer    There were no vitals filed for this visit.  Subjective Assessment - 10/30/17 1114    Subjective  Pt reports she has fallen 3x since she was seen last week. She reports she was ambulating with a walker that got stuck on the carpet and that she fell backwards, one time she was walking with a walker in the middle of the night and does not remember how she fell, and the last fall she was trying to unplug something and fell backward. Patient has bruising on her L forearm and one on her R forearm. Patient reports she is not in any pain from falling,  that she just feels very discouraged. PT encouraged patient to see PCP for further assessment. Patient light touch sensation intact in bilat LE.    Limitations  House hold activities;Standing;Walking;Lifting    Patient Stated Goals  Would like to build her leg strength and stop falling.       Ther-Ex -Bridges 3x 12 75% full ROM w/ cuing needed for eccentric control -SAQ 3x 12 w/ 2# ankle wt and 2sec hold in ext VC needed for eccentric control and proper posture -SLR 3x10   Neuromuscular Re-Ed -Fall prevention education. Verbalized getting up from a fall education. Patient verbalized understanding of all fall prevention education.  -Patient was led through a series of sit to stand exercises starting at a very elevated mat table (chair was placed in front of patient for added safety, and PT provided CGA at all times). Patient was educated to activate glutes through demonstration of her current standing technique (locking knees into extension with excessive lumbar flexion, then using UE to extend to standing) and proper technique with minimal lumbar flexion and lumbar + concurrent knee extension. With verbal cuing, demonstration, and TC of gait belt around glutes patient was able to stand with proper form with UE support pushing  off from mat table. PT continued to do sets of this lowering the table each set and throughout each set providing less tactile assistance for glute activation and by the end patient was able to independently stand from standard chair with UE push off with 75% proper technique. PT educated patient to focus on standing this way.                          PT Education - 10/30/17 1124    Education provided  Yes    Education Details  Fall prevention education, educated patient on how to properly get up from a fall and to keep cell phone at all times in case she cannot get up, encouraging patient to return to PCP regarding increased fall    Person(s) Educated   Patient    Methods  Explanation;Demonstration    Comprehension  Verbalized understanding;Returned demonstration          PT Long Term Goals - 09/27/17 1704      PT LONG TERM GOAL #1   Title  Patient will demonstrate a Berg Balance Score of at least 30/56 to demonstrate improved balance and decreased falls risk.    Baseline  20/56    Time  12    Period  Weeks    Status  New    Target Date  12/20/17      PT LONG TERM GOAL #2   Title  Patient will transfer sit to stand from a chair with no added pillows and use of UEs to demonstrate improved LE strength and reduced falls risk.     Baseline  Requires 2 pillows underneath and heavy use of LUE to complete transfer     Time  12    Period  Weeks    Status  New    Target Date  12/20/17      PT LONG TERM GOAL #3   Title  Patient will demonstrate a TUG of less than 20 seconds with no pillows in her chair and use of UEs and AD to demonstrate reduced falls risk.     Baseline  >30" with 2 pillows underneath her in chair.     Time  12    Period  Weeks    Status  New    Target Date  12/20/17      PT LONG TERM GOAL #4   Title  Patient will report no falls x 1 week at home to demonstrate improved safety with home mobility.     Baseline  Patient reports multiple falls every week.     Time  12    Period  Weeks    Status  New    Target Date  12/20/17            Plan - 10/30/17 1136    Clinical Impression Statement  Pt tolerated exercise progression well without pain. Patient was given extensive education on fall prevention and how to get up from a fall. Patient was educated on proper technique to stand from a chair and was able to safely stand (with UE with 75% improved form from a normal chair height following neuro-muscular re-ed exercise. Patient seems to be making gains with unilateral joint strengthening, but continues to have trouble "putting it together" to move functionally without LOB. Patient is more discouraged today  following falls over the weekend and is encouraged by PT to see PCP for follow-up on multiple falls.  Rehab Potential  Fair    PT Frequency  2x / week    PT Duration  12 weeks    PT Treatment/Interventions  Balance training;Neuromuscular re-education;Gait training;DME Instruction;Aquatic Therapy;Cryotherapy;Electrical Stimulation;Ultrasound;Moist Heat;Stair training;Therapeutic activities;Therapeutic exercise;Patient/family education;Manual techniques;Energy conservation    PT Next Visit Plan  Continue strengthening program adding in incorporation of functional strength (and balance activities) as able    PT Home Exercise Plan  Seated clams with YTB 2 x 15 BID, hooklying bridges 2 x 15 BID, Hooklying marches 2 x 15 BID;    Consulted and Agree with Plan of Care  Patient       Patient will benefit from skilled therapeutic intervention in order to improve the following deficits and impairments:  Abnormal gait, Pain, Decreased balance, Decreased strength, Decreased activity tolerance, Difficulty walking  Visit Diagnosis: Muscle weakness (generalized)     Problem List Patient Active Problem List   Diagnosis Date Noted  . Microscopic hematuria 10/18/2015  . Advance directive discussed with patient 10/13/2014  . Neuropathy (HCC) 08/01/2012  . Hyperthyroidism   . Routine general medical examination at a health care facility 05/09/2011  . Episodic mood disorder (HCC) 12/19/2006  . Essential hypertension, benign 12/19/2006  . RBBB 12/19/2006  . ALLERGIC RHINITIS 12/19/2006  . GERD 12/19/2006  . DIVERTICULOSIS, COLON 12/19/2006   Staci Acosta PT, DPT Staci Acosta 10/30/2017, 12:16 PM  [facility] PHYSICAL AND SPORTS MEDICINE [address] [place], [address] Phone: [phone]   Fax:  [phone]  Name: Alexandria Sherman MRN: [id number] Date of Birth: [birth year]

## 2017-10-31 ENCOUNTER — Other Ambulatory Visit (INDEPENDENT_AMBULATORY_CARE_PROVIDER_SITE_OTHER): Payer: Medicare Other

## 2017-10-31 DIAGNOSIS — Z1211 Encounter for screening for malignant neoplasm of colon: Secondary | ICD-10-CM

## 2017-10-31 LAB — FECAL OCCULT BLOOD, IMMUNOCHEMICAL: Fecal Occult Bld: NEGATIVE

## 2017-11-01 ENCOUNTER — Ambulatory Visit: Payer: Medicare Other | Admitting: Physical Therapy

## 2017-11-01 ENCOUNTER — Encounter: Payer: Self-pay | Admitting: Physical Therapy

## 2017-11-01 ENCOUNTER — Encounter: Payer: Self-pay | Admitting: Internal Medicine

## 2017-11-01 ENCOUNTER — Ambulatory Visit: Payer: Medicare Other | Admitting: Occupational Therapy

## 2017-11-01 DIAGNOSIS — M6281 Muscle weakness (generalized): Secondary | ICD-10-CM

## 2017-11-01 DIAGNOSIS — M25631 Stiffness of right wrist, not elsewhere classified: Secondary | ICD-10-CM

## 2017-11-01 DIAGNOSIS — M25641 Stiffness of right hand, not elsewhere classified: Secondary | ICD-10-CM

## 2017-11-01 DIAGNOSIS — M25531 Pain in right wrist: Secondary | ICD-10-CM

## 2017-11-01 MED ORDER — DIVALPROEX SODIUM 125 MG PO DR TAB: 125.0000 mg | DELAYED_RELEASE_TABLET | Freq: Two times a day (BID) | ORAL | 11 refills | Status: DC

## 2017-11-01 MED ORDER — VENLAFAXINE HCL 75 MG PO TABS: 75.0000 mg | ORAL_TABLET | Freq: Two times a day (BID) | ORAL | 11 refills | Status: DC

## 2017-11-01 NOTE — Therapy (Signed)
[place] [facility] [address] [place], [address] Phone: [phone]   Fax:  [phone]  Occupational Therapy Treatment  Patient Details  Name: Alexandria Sherman MRN: [id number] Date of Birth: 01-06-1946 Referring Provider: Dr. Martha Clan   Encounter Date: 11/01/2017  OT End of Session - 11/01/17 1117    Visit Number  7    Number of Visits  11    Date for OT Re-Evaluation  11/29/17    OT Start Time  1033    OT Stop Time  1115    OT Time Calculation (min)  42 min    Activity Tolerance  Patient tolerated treatment well    Behavior During Therapy  [facility] for tasks assessed/performed;Anxious       Past Medical History:  Diagnosis Date  . Anxiety disorder, unspecified   . Depression 1978  . Diverticulosis of colon 04/2005  . GERD (gastroesophageal reflux disease)   . Hypertension   . Hyperthyroidism    Dr Tedd Sias  . Impaired fasting glucose   . Low TSH level    chronic low, but euthyroid  . Microscopic hematuria   . RBBB   . Sleep disturbance   . Subclinical hyperthyroidism    unspecified  . Tibia fracture 05/2010   left    Past Surgical History:  Procedure Laterality Date  . ABDOMINAL HYSTERECTOMY  1988   hysterectomy/ BSO for endometriosis  . BREAST BIOPSY Right 1992   neg  . BREAST EXCISIONAL BIOPSY Right 1988   benign per pt  . KNEE ARTHROSCOPY  6/12   left--Dr Charlann Boxer    There were no vitals filed for this visit.  Subjective Assessment - 11/01/17 1049    Subjective   Pt report having fall about 3 x at home - loosing balance backwards in bathroom , reaching behind couch for plug , hand doing okay - feel like I can bend the index better - but still that swelling at my wrist - I think it is the way my wrist healed     Patient Stated Goals  I want to use my R dominant hand to pick up object, write , hold book , hold glass , and have more strength     Currently in Pain?  No/denies         [facility] OT  Assessment - 11/01/17 0001      Strength   Right Hand Grip (lbs)  14 rest on thigh    Left Hand Grip (lbs)  15 hold on thigh      Right Hand AROM   R Thumb MCP 0-60  50 Degrees    R Thumb IP 0-80  30 Degrees    R Thumb Radial ABduction/ADduction 0-55  40    R Thumb Palmar ABduction/ADduction 0-45  50    R Index PIP 0-100  85 Degrees    R Index DIP 0-70  -- PROM 70 , blocked AROM 45, pt active 25      Pt while waiting to transition from PT appt to OT - loss balance while attempting to move out of the way for another pt - she sat down on weight rack that is thigh high - no injuries- PT and OT  Walked with pt to office to sit   Assess pt ROM in 2nd digit and thumb -see flowsheet  Review again PROM and AROM - and stretch  Did add this date and done PROM and AROM blocked for  DIP  Assess thumb AROM in all planes  Add again rubber band place and hold for Thumb PA and RA  And review  And IP flexion of thumb PROM , blocked AROM - done and add   Grip strength assess - pt showed progress - with arm support that wrist do not buckle  See flowsheet- but pt to hold off on putty - to work more on ROM per hand surgeon   Pt do show area if swelling  still on volar wrist - did finish steroid  And still on anti inflammatory                    OT Education - 11/01/17 1116    Education provided  Yes    Education Details  Splint wear at night to prevent flexion contracture- and work on thumb and 2nd diigt flexion and PA and RA     Person(s) Educated  Patient    Methods  Explanation;Demonstration;Tactile cues;Verbal cues    Comprehension  Verbalized understanding;Returned demonstration       OT Short Term Goals - 11/01/17 1333      OT SHORT TERM GOAL #1   Title  Pt to be ind in HEP to increase R  2nd digit flexion,  extention of 3rd - strength in pinch grip and increase strength at wrist to improve functional use     Baseline  2nd digit PIP increase flexion , grip increase - but  pinches same and wrist extention     Time  3    Period  Weeks    Status  On-going    Target Date  11/22/17      OT SHORT TERM GOAL #2   Title  R wrist strength increase for pt to be able to use 1 lbs weight in all planes to be able to carry glass, cut food, stabilize wrist using AD     Baseline  wrist still collapse during gripping act     Time  4    Period  Weeks    Status  On-going    Target Date  11/29/17        OT Long Term Goals - 11/01/17 1334      OT LONG TERM GOAL #1   Title  Pt to show increase lat and 3 point pinch to write, do buttons wiith more ease     Baseline  Pinch decrease bilaterally - has CMC arthritis     Status  Deferred      OT LONG TERM GOAL #2   Title  Pain and tenderness at R wrist decrease to 2/10 at the worse     Baseline  volar wrist still appear swollen  - was like it in past and hand surgeon has pt on meds     Time  4    Period  Weeks    Status  On-going    Target Date  11/29/17            Plan - 11/01/17 1329    Clinical Impression Statement  Pt arrive this date from PT appt - appear between pt's conversation and pt - she fell 3 x since last time - but not clear- discuss with pt her falls - and per pt her PCP aware of it - and had seen a neurologist in past -  pt  volar R wrist still swollen - hand surgeon did put her on steroid - and finish with it -  still on anti inflammatory -  but no progress in swelling - pt do show increase grip strength compare to last week - using putty at home - and  2nd digit PIP flexion - pt HEP upgraded but pt to hold off on putty because per hand surgeons therapist  - she wants pt to work on ROM  for FDS and FDP - add thumb and 2nd DIP flexion HEP     Occupational performance deficits (Please refer to evaluation for details):  ADL's;IADL's;Play;Leisure    Rehab Potential  Fair    OT Frequency  1x / week    OT Duration  4 weeks    OT Treatment/Interventions  Fluidtherapy;Self-care/ADL  training;Splinting;Therapeutic exercise;Paraffin;Manual Therapy;Passive range of motion    Plan  assess progress in ROM at 2nd and thumb - grip strength without doing putty     Clinical Decision Making  Several treatment options, min-mod task modification necessary    OT Home Exercise Plan  see pt instruction     Consulted and Agree with Plan of Care  Patient       Patient will benefit from skilled therapeutic intervention in order to improve the following deficits and impairments:  Impaired flexibility, Pain, Decreased coordination, Decreased range of motion, Decreased strength, Impaired UE functional use  Visit Diagnosis: Muscle weakness (generalized) - Plan: Ot plan of care cert/re-cert  Pain in right wrist - Plan: Ot plan of care cert/re-cert  Stiffness of right hand, not elsewhere classified - Plan: Ot plan of care cert/re-cert  Stiffness of right wrist, not elsewhere classified - Plan: Ot plan of care cert/re-cert    Problem List Patient Active Problem List   Diagnosis Date Noted  . Microscopic hematuria 10/18/2015  . Advance directive discussed with patient 10/13/2014  . Neuropathy (HCC) 08/01/2012  . Hyperthyroidism   . Routine general medical examination at a health care facility 05/09/2011  . Episodic mood disorder (HCC) 12/19/2006  . Essential hypertension, benign 12/19/2006  . RBBB 12/19/2006  . ALLERGIC RHINITIS 12/19/2006  . GERD 12/19/2006  . DIVERTICULOSIS, COLON 12/19/2006    Oletta Cohn OTR/L,CLT 11/01/2017, 1:40 PM  [place] [facility] PHYSICAL AND SPORTS MEDICINE [address], [place], [address] Phone: [phone]   Fax:  [phone]  Name: Alexandria Sherman MRN: [id number] Date of Birth: 08-18-1946

## 2017-11-01 NOTE — Therapy (Signed)
[place] [facility] [address] [place], [place], [address] Phone: [phone]   Fax:  [phone]  Physical Therapy Treatment  Patient Details  Name: Alexandria ScheuermannDonna S Wagman MRN: [id number] Date of Birth: [birth year] Referring Provider: Dr. Martha ClanKrasinski   Encounter Date: 11/01/2017  PT End of Session - 11/01/17 1001    Visit Number  8    Number of Visits  25    Date for PT Re-Evaluation  12/20/17    PT Start Time  0945    PT Stop Time  1030    PT Time Calculation (min)  45 min    Activity Tolerance  Patient limited by fatigue    Behavior During Therapy  [facility] for tasks assessed/performed;Anxious       Past Medical History:  Diagnosis Date  . Anxiety disorder, unspecified   . Depression 1978  . Diverticulosis of colon 04/2005  . GERD (gastroesophageal reflux disease)   . Hypertension   . Hyperthyroidism    Dr Tedd SiasSolum  . Impaired fasting glucose   . Low TSH level    chronic low, but euthyroid  . Microscopic hematuria   . RBBB   . Sleep disturbance   . Subclinical hyperthyroidism    unspecified  . Tibia fracture 05/2010   left    Past Surgical History:  Procedure Laterality Date  . ABDOMINAL HYSTERECTOMY  1988   hysterectomy/ BSO for endometriosis  . BREAST BIOPSY Right 1992   neg  . BREAST EXCISIONAL BIOPSY Right 1988   benign per pt  . KNEE ARTHROSCOPY  6/12   left--Dr Charlann Boxerlin    There were no vitals filed for this visit.  Subjective Assessment - 11/01/17 0947    Subjective  Pt reports she has not fallen since the weekend. Patient says she has not made an appt with her PCP because she just has "so much going on". She reports she is scared to be told she has to have a surgery. PT continued to encourage pt to see PCP, and noted that there can be a simple solution that does not involve surgery, and that for her health she made need further work-up done following multiple falls.     Limitations  House hold  activities;Standing;Walking;Lifting    Patient Stated Goals  Would like to build her leg strength and stop falling.          Ther-Ex -Bridge exercise 3x 12 between 75% and 100% full ROM -3x 12 (each side) clamshell with consistent cuing for pelvic placement -STS exercise from elevated mat table requiring VC/TC and demonstration to properly initiate hip ext. TC throughout exercise for hip ext and CGA w/ gait belt for safety 3x 5 stands  -Seated heel raise (unable to complete in standing) 3x 12 50% ROM -SAQ 3x 12 w/ 2# weight w/ cuing for eccentric control                     PT Education - 11/01/17 0955    Education provided  Yes    Education Details  Exercise form; futher encouragement to visit PCP about increased falling    Person(s) Educated  Patient    Methods  Explanation    Comprehension  Verbalized understanding          PT Long Term Goals - 09/27/17 1704      PT LONG TERM GOAL #1   Title  Patient will demonstrate a Berg Balance Score of at least 30/56  to demonstrate improved balance and decreased falls risk.    Baseline  20/56    Time  12    Period  Weeks    Status  New    Target Date  12/20/17      PT LONG TERM GOAL #2   Title  Patient will transfer sit to stand from a chair with no added pillows and use of UEs to demonstrate improved LE strength and reduced falls risk.     Baseline  Requires 2 pillows underneath and heavy use of LUE to complete transfer     Time  12    Period  Weeks    Status  New    Target Date  12/20/17      PT LONG TERM GOAL #3   Title  Patient will demonstrate a TUG of less than 20 seconds with no pillows in her chair and use of UEs and AD to demonstrate reduced falls risk.     Baseline  >30" with 2 pillows underneath her in chair.     Time  12    Period  Weeks    Status  New    Target Date  12/20/17      PT LONG TERM GOAL #4   Title  Patient will report no falls x 1 week at home to demonstrate improved safety with  home mobility.     Baseline  Patient reports multiple falls every week.     Time  12    Period  Weeks    Status  New    Target Date  12/20/17            Plan - 11/01/17 1014    Clinical Impression Statement  Pt tolerated exercise progression well with no increased pain. Patient was able to complete STS exercise with much better form for the 1-2 repitition, but by the fifth was returning to dominant knee ext , that she was able to notice and correct after prolonged rest break.  Patient is tolerating strength progression well and is able to notice when her form is incorrect, but requires PT cuing to correct it. PT continued to encourage pt to see PCP which she reported she was scared to do because she did not want surgery. PT continued to encourage the involvement of a health care team but is unsure if patient will comply.    Rehab Potential  Fair    PT Frequency  2x / week    PT Duration  12 weeks    PT Treatment/Interventions  Balance training;Neuromuscular re-education;Gait training;DME Instruction;Aquatic Therapy;Cryotherapy;Electrical Stimulation;Ultrasound;Moist Heat;Stair training;Therapeutic activities;Therapeutic exercise;Patient/family education;Manual techniques;Energy conservation    PT Next Visit Plan  Continue strengthening program adding in incorporation of functional strength (and balance activities) as able    PT Home Exercise Plan  Seated clams with YTB 2 x 15 BID, hooklying bridges 2 x 15 BID, Hooklying marches 2 x 15 BID;    Consulted and Agree with Plan of Care  Patient       Patient will benefit from skilled therapeutic intervention in order to improve the following deficits and impairments:  Abnormal gait, Pain, Decreased balance, Decreased strength, Decreased activity tolerance, Difficulty walking  Visit Diagnosis: Muscle weakness (generalized)     Problem List Patient Active Problem List   Diagnosis Date Noted  . Microscopic hematuria 10/18/2015  . Advance  directive discussed with patient 10/13/2014  . Neuropathy (HCC) 08/01/2012  . Hyperthyroidism   . Routine general medical examination at a  health care facility 05/09/2011  . Episodic mood disorder (HCC) 12/19/2006  . Essential hypertension, benign 12/19/2006  . RBBB 12/19/2006  . ALLERGIC RHINITIS 12/19/2006  . GERD 12/19/2006  . DIVERTICULOSIS, COLON 12/19/2006   Staci Acosta PT, DPT Staci Acosta 11/01/2017, 10:28 AM  [place] [facility] PHYSICAL AND SPORTS MEDICINE [address], [place], [address] Phone: [phone]   Fax:  [phone]  Name: GLENNDA WEATHERHOLTZ MRN: [id number] Date of Birth: 1945/10/07

## 2017-11-01 NOTE — Telephone Encounter (Signed)
In her MyChart message when she asked about the medications, It stated she was doing 1 a day. I am not sure why the rx for venlaxine defaulted to bid because I sent it #30.   How do you want her to take them? I looked at her last OV note 10-22-17 and it just said to start regimen.

## 2017-11-06 ENCOUNTER — Ambulatory Visit: Payer: Medicare Other | Admitting: Occupational Therapy

## 2017-11-06 ENCOUNTER — Ambulatory Visit: Payer: Medicare Other | Admitting: Physical Therapy

## 2017-11-08 ENCOUNTER — Encounter: Payer: Self-pay | Admitting: Physical Therapy

## 2017-11-08 ENCOUNTER — Ambulatory Visit: Payer: Medicare Other | Admitting: Physical Therapy

## 2017-11-08 ENCOUNTER — Ambulatory Visit: Payer: Medicare Other | Admitting: Occupational Therapy

## 2017-11-08 DIAGNOSIS — M6281 Muscle weakness (generalized): Secondary | ICD-10-CM | POA: Diagnosis not present

## 2017-11-08 DIAGNOSIS — M25531 Pain in right wrist: Secondary | ICD-10-CM

## 2017-11-08 DIAGNOSIS — M25631 Stiffness of right wrist, not elsewhere classified: Secondary | ICD-10-CM

## 2017-11-08 DIAGNOSIS — M25641 Stiffness of right hand, not elsewhere classified: Secondary | ICD-10-CM

## 2017-11-08 NOTE — Therapy (Signed)
[place] [facility] PH[facility]TS MEDICINE [address], [place], [address] Phone: [phone]   Fax:  [phone]  Physical Therapy Treatment  Patient Details  Name: Alexandria Sherman MRN: [id number] Date of Birth: 1946-06-17 Referring Provider: Dr. Martha Clan   Encounter Date: 11/08/2017  PT End of Session - 11/08/17 0956    Visit Number  9    Number of Visits  25    Date for PT Re-Evaluation  12/20/17    PT Start Time  0948    PT Stop Time  1030    PT Time Calculation (min)  42 min    Equipment Utilized During Treatment  Gait belt    Activity Tolerance  Patient limited by fatigue    Behavior During Therapy  [facility] for tasks assessed/performed;Anxious       Past Medical History:  Diagnosis Date  . Anxiety disorder, unspecified   . Depression 1978  . Diverticulosis of colon 04/2005  . GERD (gastroesophageal reflux disease)   . Hypertension   . Hyperthyroidism    Dr Tedd Sias  . Impaired fasting glucose   . Low TSH level    chronic low, but euthyroid  . Microscopic hematuria   . RBBB   . Sleep disturbance   . Subclinical hyperthyroidism    unspecified  . Tibia fracture 05/2010   left    Past Surgical History:  Procedure Laterality Date  . ABDOMINAL HYSTERECTOMY  1988   hysterectomy/ BSO for endometriosis  . BREAST BIOPSY Right 1992   neg  . BREAST EXCISIONAL BIOPSY Right 1988   benign per pt  . KNEE ARTHROSCOPY  6/12   left--Dr Charlann Boxer    There were no vitals filed for this visit.  Subjective Assessment - 11/08/17 0952    Subjective  Pt reports she has not fallen since last visit. She reports she missed her Tuesday visit because "she does not go out in the rain". Pt reports she did not make an appt with her PCP about increased falling, but that she has not been in any pain.     Limitations  House hold activities;Standing;Walking;Lifting    Patient Stated Goals  Would like to build her leg strength and stop falling.    Pain  Onset  More than a month ago            Ther-Ex -Bridge 3x 10 w/ yellow tband at knees -Clamshell 3x 10 w/ yellow tband at knees and cuing for proper pelvic positioning -OMEGA leg press 10# w/ patient requiring assistance to transfer to and from machine w/o LOB -Seated heel raises 3x 12 w/ cuing for eccentric motion -STS from elevated mat table 3x 8 w/ cuing for slow lowering with control to prevent posterior LOB  NeuroMuscular Re-Ed -Seated balance with perturbation and reaching activity with feet supported and unsupported. Reaching outside BOS w/ feet unsupported presented highest challenge to patient, and she had to occasionally use opposite UE for stabilization.                  PT Education - 11/08/17 0954    Education provided  Yes    Education Details  Exercise form    Person(s) Educated  Patient    Methods  Explanation;Verbal cues;Tactile cues    Comprehension  Verbalized understanding;Returned demonstration          PT Long Term Goals - 09/27/17 1704      PT LONG TERM GOAL #1   Title  Patient will demonstrate a Berg Balance Score of at least 30/56 to demonstrate improved balance and decreased falls risk.    Baseline  20/56    Time  12    Period  Weeks    Status  New    Target Date  12/20/17      PT LONG TERM GOAL #2   Title  Patient will transfer sit to stand from a chair with no added pillows and use of UEs to demonstrate improved LE strength and reduced falls risk.     Baseline  Requires 2 pillows underneath and heavy use of LUE to complete transfer     Time  12    Period  Weeks    Status  New    Target Date  12/20/17      PT LONG TERM GOAL #3   Title  Patient will demonstrate a TUG of less than 20 seconds with no pillows in her chair and use of UEs and AD to demonstrate reduced falls risk.     Baseline  >30" with 2 pillows underneath her in chair.     Time  12    Period  Weeks    Status  New    Target Date  12/20/17      PT LONG TERM  GOAL #4   Title  Patient will report no falls x 1 week at home to demonstrate improved safety with home mobility.     Baseline  Patient reports multiple falls every week.     Time  12    Period  Weeks    Status  New    Target Date  12/20/17            Plan - 11/08/17 1017    Clinical Impression Statement  Pt tolerated treatment session well with noted fatigue with therex. Patient continues to require PT assistance during transitional movements to reduce risk of falling. Patient's seated balance was assessed and challenged with seated reaching and perturbation and was fair. PT will continue to work on balance in this position as it is challenging, and puts patient at less of a risk for falls.    PT Frequency  2x / week    PT Duration  12 weeks    PT Treatment/Interventions  Balance training;Neuromuscular re-education;Gait training;DME Instruction;Aquatic Therapy;Cryotherapy;Electrical Stimulation;Ultrasound;Moist Heat;Stair training;Therapeutic activities;Therapeutic exercise;Patient/family education;Manual techniques;Energy conservation    PT Next Visit Plan  Continue strengthening program adding in incorporation of functional strength (and balance activities) as able    PT Home Exercise Plan  Seated clams with YTB 2 x 15 BID, hooklying bridges 2 x 15 BID, Hooklying marches 2 x 15 BID;    Consulted and Agree with Plan of Care  Patient       Patient will benefit from skilled therapeutic intervention in order to improve the following deficits and impairments:  Abnormal gait, Pain, Decreased balance, Decreased strength, Decreased activity tolerance, Difficulty walking  Visit Diagnosis: Muscle weakness (generalized)     Problem List Patient Active Problem List   Diagnosis Date Noted  . Microscopic hematuria 10/18/2015  . Advance directive discussed with patient 10/13/2014  . Neuropathy (HCC) 08/01/2012  . Hyperthyroidism   . Routine general medical examination at a health care  facility 05/09/2011  . Episodic mood disorder (HCC) 12/19/2006  . Essential hypertension, benign 12/19/2006  . RBBB 12/19/2006  . ALLERGIC RHINITIS 12/19/2006  . GERD 12/19/2006  . DIVERTICULOSIS, COLON 12/19/2006   Staci Acosta PT, DPT Staci Acosta 11/08/2017,  10:34 AM  [place] [facility]IC[facility] [address] [place], [place], [address] Phone: [phone]   Fax:  [phone]  Name: Alexandria Sherman MRN: [id number] Date of Birth: 1946-05-02

## 2017-11-08 NOTE — Patient Instructions (Signed)
Add PROM for PIP extention of 5th and 3rd  And to start putty back again for rolling - 2 point pinch, lat grip  And gripping  10 reps   2 sets   2 x day

## 2017-11-08 NOTE — Therapy (Signed)
[facility] PHYSICAL AND SPORTS MEDICINE [address], [place], [address] Phone: [phone]   Fax:  [phone]  Occupational Therapy Treatment  Patient Details  Name: Alexandria Sherman MRN: [id number] Date of Birth: 1946/07/22 Referring Provider: Dr. Martha Clan   Encounter Date: 11/08/2017  OT End of Session - 11/08/17 1220    Visit Number  8    Number of Visits  11    Date for OT Re-Evaluation  11/29/17    OT Start Time  1020    OT Stop Time  1101    OT Time Calculation (min)  41 min    Activity Tolerance  Patient tolerated treatment well    Behavior During Therapy  [facility] for tasks assessed/performed;Anxious       Past Medical History:  Diagnosis Date  . Anxiety disorder, unspecified   . Depression 1978  . Diverticulosis of colon 04/2005  . GERD (gastroesophageal reflux disease)   . Hypertension   . Hyperthyroidism    Dr Tedd Sias  . Impaired fasting glucose   . Low TSH level    chronic low, but euthyroid  . Microscopic hematuria   . RBBB   . Sleep disturbance   . Subclinical hyperthyroidism    unspecified  . Tibia fracture 05/2010   left    Past Surgical History:  Procedure Laterality Date  . ABDOMINAL HYSTERECTOMY  1988   hysterectomy/ BSO for endometriosis  . BREAST BIOPSY Right 1992   neg  . BREAST EXCISIONAL BIOPSY Right 1988   benign per pt  . KNEE ARTHROSCOPY  6/12   left--Dr Charlann Boxer    There were no vitals filed for this visit.  Subjective Assessment - 11/08/17 1218    Subjective   I think I see more bending in the tip of my index finger - and did wear the splint on my piinkie and ring finger since last week -did not wear it for about 3 wks prior - I think that hand surgeon said to follow up with her in 3 months     Patient Stated Goals  I want to use my R dominant hand to pick up object, write , hold book , hold glass , and have more strength     Currently in Pain?  No/denies         [facility] OT Assessment -  11/08/17 0001      Right Hand AROM   R Thumb MCP 0-60  50 Degrees    R Thumb IP 0-80  36 Degrees    R Index PIP 0-100  90 Degrees    R Index DIP 0-70  -- 30 AROM , blocked 50, PROM 70          Assess pt ROM in 2nd digit and thumb -see flowsheet  Review again PROM and AROM - and stretch to Thumb and 2nd digit   add PROM for PIP extention to 3rd and 5th  Cont with  PROM and AROM blocked for DIP  Cont with  rubber band place and hold for Thumb PA and RA   And IP flexion of thumb PROM , blocked AROM   Started back light blue putty again - rolling for digits extention  And 2 point pinch - with 3 point - show increase 3rd flexion - and no 2nd DIP or PIP flexion  Lat grip- need to place putty under thumb -and mod v/c to keep 2nd digit flexion  And Gripping - keep thumb out  of palm  10 reps   2 sets  2 x day    Told pt and MD note says to follow up in 6 wks from last visit              OT Education - 11/08/17 1219    Education provided  Yes    Education Details  Review and add PIP extention and putty     Person(s) Educated  Patient    Methods  Demonstration;Tactile cues;Handout    Comprehension  Returned demonstration       OT Short Term Goals - 11/01/17 1333      OT SHORT TERM GOAL #1   Title  Pt to be ind in HEP to increase R  2nd digit flexion,  extention of 3rd - strength in pinch grip and increase strength at wrist to improve functional use     Baseline  2nd digit PIP increase flexion , grip increase - but pinches same and wrist extention     Time  3    Period  Weeks    Status  On-going    Target Date  11/22/17      OT SHORT TERM GOAL #2   Title  R wrist strength increase for pt to be able to use 1 lbs weight in all planes to be able to carry glass, cut food, stabilize wrist using AD     Baseline  wrist still collapse during gripping act     Time  4    Period  Weeks    Status  On-going    Target Date  11/29/17        OT Long Term Goals - 11/01/17  1334      OT LONG TERM GOAL #1   Title  Pt to show increase lat and 3 point pinch to write, do buttons wiith more ease     Baseline  Pinch decrease bilaterally - has CMC arthritis     Status  Deferred      OT LONG TERM GOAL #2   Title  Pain and tenderness at R wrist decrease to 2/10 at the worse     Baseline  volar wrist still appear swollen  - was like it in past and hand surgeon has pt on meds     Time  4    Period  Weeks    Status  On-going    Target Date  11/29/17            Plan - 11/08/17 1220    Clinical Impression Statement  Pt show increase DIP and PIP flexion at 2nd diigt  - Pt show increase flexor contracture this date at PIP of 5th - pt to wear night time extention splint -and add putty again - and focus on DIP flexion of 2nd , IP flexion of thumb      Occupational performance deficits (Please refer to evaluation for details):  ADL's;IADL's;Play;Leisure    Rehab Potential  Fair    OT Frequency  1x / week    OT Duration  4 weeks    OT Treatment/Interventions  Fluidtherapy;Self-care/ADL training;Splinting;Therapeutic exercise;Paraffin;Manual Therapy;Passive range of motion    Plan  assess progress in ROM , and assess grip and prehension again     Clinical Decision Making  Several treatment options, min-mod task modification necessary    OT Home Exercise Plan  see pt instruction     Consulted and Agree with Plan of Care  Patient  Patient will benefit from skilled therapeutic intervention in order to improve the following deficits and impairments:  Impaired flexibility, Pain, Decreased coordination, Decreased range of motion, Decreased strength, Impaired UE functional use  Visit Diagnosis: Muscle weakness (generalized)  Pain in right wrist  Stiffness of right hand, not elsewhere classified  Stiffness of right wrist, not elsewhere classified    Problem List Patient Active Problem List   Diagnosis Date Noted  . Microscopic hematuria 10/18/2015  .  Advance directive discussed with patient 10/13/2014  . Neuropathy (HCC) 08/01/2012  . Hyperthyroidism   . Routine general medical examination at a health care facility 05/09/2011  . Episodic mood disorder (HCC) 12/19/2006  . Essential hypertension, benign 12/19/2006  . RBBB 12/19/2006  . ALLERGIC RHINITIS 12/19/2006  . GERD 12/19/2006  . DIVERTICULOSIS, COLON 12/19/2006    Oletta Cohn OTR/l,CLT 11/08/2017, 12:23 PM  [place] [facility] PHYSICAL AND SPORTS MEDICINE [address], [place], [address] Phone: [phone]   Fax:  [phone]  Name: Alexandria Sherman MRN: [id number] Date of Birth: 12-10-1945

## 2017-11-13 ENCOUNTER — Ambulatory Visit: Payer: Medicare Other | Admitting: Physical Therapy

## 2017-11-13 ENCOUNTER — Encounter: Payer: Medicare Other | Admitting: Occupational Therapy

## 2017-11-13 DIAGNOSIS — M6281 Muscle weakness (generalized): Secondary | ICD-10-CM | POA: Diagnosis not present

## 2017-11-13 NOTE — Therapy (Addendum)
[facility] [facility] PH[facility]ICINE [address], [place], [address] Phone: [phone]   Fax:  [phone]  Physical Therapy Treatment  Patient Details  Name: Alexandria ScheuermannDonna S Green MRN: [id number] Date of Birth: 10-04-1945 Referring Provider: Dr. Martha ClanKrasinski   Encounter Date: 11/13/2017  PT End of Session - 11/13/17 1010    Visit Number  10    Number of Visits  25    Date for PT Re-Evaluation  12/20/17    PT Start Time  1003    PT Stop Time  1043    PT Time Calculation (min)  40 min    Activity Tolerance  Patient limited by fatigue    Behavior During Therapy  [facility] for tasks assessed/performed;Anxious       Past Medical History:  Diagnosis Date  . Anxiety disorder, unspecified   . Depression 1978  . Diverticulosis of colon 04/2005  . GERD (gastroesophageal reflux disease)   . Hypertension   . Hyperthyroidism    Dr Tedd SiasSolum  . Impaired fasting glucose   . Low TSH level    chronic low, but euthyroid  . Microscopic hematuria   . RBBB   . Sleep disturbance   . Subclinical hyperthyroidism    unspecified  . Tibia fracture 05/2010   left    Past Surgical History:  Procedure Laterality Date  . ABDOMINAL HYSTERECTOMY  1988   hysterectomy/ BSO for endometriosis  . BREAST BIOPSY Right 1992   neg  . BREAST EXCISIONAL BIOPSY Right 1988   benign per pt  . KNEE ARTHROSCOPY  6/12   left--Dr Charlann Boxerlin    There were no vitals filed for this visit.  Subjective Assessment - 11/13/17 1006    Subjective  Patient reports reports she has not fallen since last visit, but that she has LOB a few times and was able to recover. She reports if she "starts her day feeling unsteady it stays that way" . She reports she has not really left the house other than [facility] appointments[facility], and through the bank drive through once. She reports that she is mostly staying on 1 floor in her home, but that she used her stair lift to go down her stairs  once over the weekend.     Limitations  House hold activities;Standing;Walking;Lifting    Patient Stated Goals  Would like to build her leg strength and stop falling.    Currently in Pain?  No/denies    Pain Onset  More than a month ago           Ther-Ex -LAQ 3x12 each (as patient cannot complete SLR d/t quad weakness) -Clamshell 3x 12 w/ yellow tband at knees and cuing for proper pelvic positioning -Reverse clamshell 3x 10 w/ yellow tband at ankles and cuing for proper pelvic positioning -Seated heel raises 3x 12 w/ cuing for eccentric motion -SAQ -STS from elevated mat table 1x 8 > 1x 9 > 1x 10 w/ cuing for slow lowering with control to prevent posterior LOB with sitting, and for full hip ext in standing   NeuroMuscular Re-Ed -Seated balance exercise w/ LE unsupported from raised mat table, patient completed cone reaching activity where patient was able to minimize UE support with reaching outside BOS w/ BUE w/ continuous cuing from PT to use trunk control for balance as opposed to opposing UE  PT Education - 11/13/17 1010    Education provided  Yes    Education Details  Exercise form    Person(s) Educated  Patient    Methods  Explanation;Demonstration;Verbal cues;Tactile cues    Comprehension  Verbalized understanding;Returned demonstration;Verbal cues required          PT Long Term Goals - 09/27/17 1704      PT LONG TERM GOAL #1   Title  Patient will demonstrate a Berg Balance Score of at least 30/56 to demonstrate improved balance and decreased falls risk.    Baseline  20/56    Time  12    Period  Weeks    Status  New    Target Date  12/20/17      PT LONG TERM GOAL #2   Title  Patient will transfer sit to stand from a chair with no added pillows and use of UEs to demonstrate improved LE strength and reduced falls risk.     Baseline  Requires 2 pillows underneath and heavy use of LUE to complete transfer     Time  12    Period   Weeks    Status  New    Target Date  12/20/17      PT LONG TERM GOAL #3   Title  Patient will demonstrate a TUG of less than 20 seconds with no pillows in her chair and use of UEs and AD to demonstrate reduced falls risk.     Baseline  >30" with 2 pillows underneath her in chair.     Time  12    Period  Weeks    Status  New    Target Date  12/20/17      PT LONG TERM GOAL #4   Title  Patient will report no falls x 1 week at home to demonstrate improved safety with home mobility.     Baseline  Patient reports multiple falls every week.     Time  12    Period  Weeks    Status  New    Target Date  12/20/17            Plan - 11/13/17 1052    Clinical Impression Statement  Patient continues to tolerate ther-ex progression well with noted fatigue. Patient continues to use UE to prevent LOB with unsupported seated balance. PT discussed possibility of patient (at least 1x/week) completing PT at [facility] so that she can use the parallel bars and other equipment to work on balance in wt bearing positions. Patient expressed that she did not like her last experience at [facility] as she felt like she was unable to use the equipment she wanted. PT explained the difference between wellness program and structured PT, and advised patient that d/t her LOB and falls in the clinic that this PT feels as tough [facility] may be a more productive place for her to build on her strength gains in a safer environment conducive to higher level balance activities. Patient reported she would "think" about this. Patient will continue to benefit from resistance strengthening as loss of strength is the main barrier to safe ambulation w/o LOB.     PT Frequency  2x / week    PT Duration  12 weeks    PT Treatment/Interventions  Balance training;Neuromuscular re-education;Gait training;DME Instruction;Aquatic Therapy;Cryotherapy;Electrical Stimulation;Ultrasound;Moist Heat;Stair training;Therapeutic activities;Therapeutic  exercise;Patient/family education;Manual techniques;Energy conservation    PT Next Visit Plan  Continue strengthening program adding in incorporation of functional strength (and balance activities)  as able    PT Home Exercise Plan  Seated clams with YTB 2 x 15 BID, hooklying bridges 2 x 15 BID, Hooklying marches 2 x 15 BID;    Consulted and Agree with Plan of Care  Patient       Patient will benefit from skilled therapeutic intervention in order to improve the following deficits and impairments:  Abnormal gait, Pain, Decreased balance, Decreased strength, Decreased activity tolerance, Difficulty walking  Visit Diagnosis: Muscle weakness (generalized)     Problem List Patient Active Problem List   Diagnosis Date Noted  . Microscopic hematuria 10/18/2015  . Advance directive discussed with patient 10/13/2014  . Neuropathy (HCC) 08/01/2012  . Hyperthyroidism   . Routine general medical examination at a health care facility 05/09/2011  . Episodic mood disorder (HCC) 12/19/2006  . Essential hypertension, benign 12/19/2006  . RBBB 12/19/2006  . ALLERGIC RHINITIS 12/19/2006  . GERD 12/19/2006  . DIVERTICULOSIS, COLON 12/19/2006   Staci Acosta PT, DPT Staci Acosta 11/13/2017, 11:14 AM  [place] [facility] PHYSICAL AND SPORTS MEDICINE [address], [place], [address] Phone: [phone]   Fax:  [phone]  Name: ANTONIETTA LANSDOWNE MRN: [id number] Date of Birth: June 22, 1946

## 2017-11-15 ENCOUNTER — Ambulatory Visit: Payer: Medicare Other | Admitting: Physical Therapy

## 2017-11-15 ENCOUNTER — Ambulatory Visit: Payer: Medicare Other | Admitting: Occupational Therapy

## 2017-11-20 ENCOUNTER — Encounter: Payer: Self-pay | Admitting: Physical Therapy

## 2017-11-20 ENCOUNTER — Ambulatory Visit: Payer: Medicare Other | Admitting: Physical Therapy

## 2017-11-20 ENCOUNTER — Encounter: Payer: Medicare Other | Admitting: Occupational Therapy

## 2017-11-20 DIAGNOSIS — M6281 Muscle weakness (generalized): Secondary | ICD-10-CM

## 2017-11-20 NOTE — Therapy (Signed)
[place] [facility] PH[facility]ORTS MEDICINE [address], [place], [address] Phone: [phone]   Fax:  [phone]  Physical Therapy Treatment  Patient Details  Name: Alexandria Sherman MRN: [id number] Date of Birth: 1946/06/16 Referring Provider: Dr. Martha Clan   Encounter Date: 11/20/2017  PT End of Session - 11/20/17 1011    Visit Number  11    Number of Visits  25    Date for PT Re-Evaluation  12/20/17    PT Start Time  0957    PT Stop Time  1040    PT Time Calculation (min)  43 min    Activity Tolerance  Patient limited by fatigue    Behavior During Therapy  [facility] for tasks assessed/performed;Anxious       Past Medical History:  Diagnosis Date  . Anxiety disorder, unspecified   . Depression 1978  . Diverticulosis of colon 04/2005  . GERD (gastroesophageal reflux disease)   . Hypertension   . Hyperthyroidism    Dr Tedd Sias  . Impaired fasting glucose   . Low TSH level    chronic low, but euthyroid  . Microscopic hematuria   . RBBB   . Sleep disturbance   . Subclinical hyperthyroidism    unspecified  . Tibia fracture 05/2010   left    Past Surgical History:  Procedure Laterality Date  . ABDOMINAL HYSTERECTOMY  1988   hysterectomy/ BSO for endometriosis  . BREAST BIOPSY Right 1992   neg  . BREAST EXCISIONAL BIOPSY Right 1988   benign per pt  . KNEE ARTHROSCOPY  6/12   left--Dr Charlann Boxer    There were no vitals filed for this visit.  Subjective Assessment - 11/20/17 0959    Subjective  Patient reports she has been unable to come d/t the rain. Patient reports she is unable to leave her home when it rains. Patient reports no falls or pain over the past week. Patient reports that she thought about PT's conversation about seeing a therapist at [facility] d/t the [facility] location having more equipment for her balance.     Pertinent History  d/t     Limitations  House hold activities;Standing;Walking;Lifting    Patient Stated Goals   Would like to build her leg strength and stop falling.    Currently in Pain?  No/denies    Pain Score  0-No pain    Pain Onset  More than a month ago       Ther-Ex  -Nustep 5 min for warm-up (unbilled) -Seated heel raises w/ 2# ankle wt 3x 12 w/ cuing for eccentric motion and manual resistance -SAQ 2# 3x 12 w. 2sec hold in ext and cuing for eccentric control w/ more cuing needed for L LE -STS from elevated mat table 3x 10 w/ cuing for slow lowering with control to prevent posterior LOB with sitting, and for full hip ext in standing. D/t fatigue at the end of reps patient begins to "lead with her head" flexed at the hips with knee ext, which she is aware of and attempts to correct.  *Prolonged rest break needed between sets of all exercise today   NeuroMuscular Re-Ed Standing balance activity -Standing feet shoulder width apart eyes open: 30sec with min postural sway -Standing feet together 21sec before LOB w/ PT required assistance to safely lower to seated position with increased postural sway during activity. Second trial patient had increased sway as well and was able to stand for 31sec before LOB requiring PT  assistance for lowering -Standing feet shoulder width apart eyes closed: 4 sec before LOB requiring PT assistance to slower lower back to seated position -Semi Tandem R foot in front able to hold 12sec/L foot in front 23sec with postural sway before requiring PT assistance to safely lower down to mat table                    PT Education - 11/20/17 1010    Education provided  Yes    Education Details  Exercise form; transferring to [facility] clinic for further balance training    Person(s) Educated  Patient    Methods  Explanation;Demonstration;Verbal cues    Comprehension  Verbalized understanding;Returned demonstration;Verbal cues required;Need further instruction          PT Long Term Goals - 09/27/17 1704      PT LONG TERM GOAL #1   Title  Patient will  demonstrate a Berg Balance Score of at least 30/56 to demonstrate improved balance and decreased falls risk.    Baseline  20/56    Time  12    Period  Weeks    Status  New    Target Date  12/20/17      PT LONG TERM GOAL #2   Title  Patient will transfer sit to stand from a chair with no added pillows and use of UEs to demonstrate improved LE strength and reduced falls risk.     Baseline  Requires 2 pillows underneath and heavy use of LUE to complete transfer     Time  12    Period  Weeks    Status  New    Target Date  12/20/17      PT LONG TERM GOAL #3   Title  Patient will demonstrate a TUG of less than 20 seconds with no pillows in her chair and use of UEs and AD to demonstrate reduced falls risk.     Baseline  >30" with 2 pillows underneath her in chair.     Time  12    Period  Weeks    Status  New    Target Date  12/20/17      PT LONG TERM GOAL #4   Title  Patient will report no falls x 1 week at home to demonstrate improved safety with home mobility.     Baseline  Patient reports multiple falls every week.     Time  12    Period  Weeks    Status  New    Target Date  12/20/17            Plan - 11/20/17 1056    Clinical Impression Statement  Patient tolerated therex progression well with increased fatigue. PT continued to discuss the benefit of continuing her POC at the [facility] facility where there is more equipment to safely progress dynamic balance, as dynamic gia tis the source of her falls. PT assessed pts static balance and patient agreed that she made need further assistance, but was worried about her ability to access the clinic. PT explained to paitent that she is welcome to utilize valet parking and wheelchair, which she was unaware of before, and then agreed to schedule futher appoints there. Patient will be seen at this facility 2/28 and will begin being seen at [facility] 3/5.     Rehab Potential  Fair    PT Frequency  2x / week    PT Duration  12 weeks    PT  Treatment/Interventions  Balance training;Neuromuscular re-education;Gait training;DME Instruction;Aquatic Therapy;Cryotherapy;Electrical Stimulation;Ultrasound;Moist Heat;Stair training;Therapeutic activities;Therapeutic exercise;Patient/family education;Manual techniques;Energy conservation    PT Next Visit Plan  Continue strengthening program adding in incorporation of functional strength (and balance activities) as able    PT Home Exercise Plan  Seated clams with YTB 2 x 15 BID, hooklying bridges 2 x 15 BID, Hooklying marches 2 x 15 BID;    Consulted and Agree with Plan of Care  Patient       Patient will benefit from skilled therapeutic intervention in order to improve the following deficits and impairments:  Abnormal gait, Pain, Decreased balance, Decreased strength, Decreased activity tolerance, Difficulty walking  Visit Diagnosis: Muscle weakness (generalized)     Problem List Patient Active Problem List   Diagnosis Date Noted  . Microscopic hematuria 10/18/2015  . Advance directive discussed with patient 10/13/2014  . Neuropathy (HCC) 08/01/2012  . Hyperthyroidism   . Routine general medical examination at a health care facility 05/09/2011  . Episodic mood disorder (HCC) 12/19/2006  . Essential hypertension, benign 12/19/2006  . RBBB 12/19/2006  . ALLERGIC RHINITIS 12/19/2006  . GERD 12/19/2006  . DIVERTICULOSIS, COLON 12/19/2006   Staci Acosta PT, DPT Staci Acosta 11/20/2017, 11:08 AM  [place] [facility] PHYSICAL AND SPORTS MEDICINE [address], [place], [address] Phone: [phone]   Fax:  [phone]  Name: ISLEY WEISHEIT MRN: [id number] Date of Birth: 12-03-1945

## 2017-11-22 ENCOUNTER — Ambulatory Visit: Payer: Medicare Other | Admitting: Occupational Therapy

## 2017-11-22 ENCOUNTER — Encounter: Payer: Self-pay | Admitting: Physical Therapy

## 2017-11-22 ENCOUNTER — Ambulatory Visit: Payer: Medicare Other | Admitting: Physical Therapy

## 2017-11-22 DIAGNOSIS — M6281 Muscle weakness (generalized): Secondary | ICD-10-CM

## 2017-11-22 DIAGNOSIS — M25531 Pain in right wrist: Secondary | ICD-10-CM

## 2017-11-22 DIAGNOSIS — M25641 Stiffness of right hand, not elsewhere classified: Secondary | ICD-10-CM

## 2017-11-22 DIAGNOSIS — M25631 Stiffness of right wrist, not elsewhere classified: Secondary | ICD-10-CM

## 2017-11-22 DIAGNOSIS — R2689 Other abnormalities of gait and mobility: Secondary | ICD-10-CM

## 2017-11-22 NOTE — Therapy (Signed)
[facility]INE [address], [place], [address] Phone: [phone]   Fax:  [phone]  Occupational Therapy Treatment  Patient Details  Name: Alexandria Sherman MRN: [id number] Date of Birth: [birth year] Referring Provider: Dr. Martha Clan   Encounter Date: 11/22/2017  OT End of Session - 11/22/17 1126    Visit Number  9    Number of Visits  11    Date for OT Re-Evaluation  11/29/17    OT Start Time  1047    OT Stop Time  1123    OT Time Calculation (min)  36 min    Activity Tolerance  Patient tolerated treatment well    Behavior During Therapy  [name] for tasks assessed/performed;Anxious       Past Medical History:  Diagnosis Date  . Anxiety disorder, unspecified   . Depression 1978  . Diverticulosis of colon 04/2005  . GERD (gastroesophageal reflux disease)   . Hypertension   . Hyperthyroidism    Dr Tedd Sias  . Impaired fasting glucose   . Low TSH level    chronic low, but euthyroid  . Microscopic hematuria   . RBBB   . Sleep disturbance   . Subclinical hyperthyroidism    unspecified  . Tibia fracture 05/2010   left    Past Surgical History:  Procedure Laterality Date  . ABDOMINAL HYSTERECTOMY  1988   hysterectomy/ BSO for endometriosis  . BREAST BIOPSY Right 1992   neg  . BREAST EXCISIONAL BIOPSY Right 1988   benign per pt  . KNEE ARTHROSCOPY  6/12   left--Dr Charlann Boxer    There were no vitals filed for this visit.  Subjective Assessment - 11/22/17 1124    Subjective   Do you think I have this - showing OT picture of dupuytrens - and I am thinking to go and see hand surgeon in [place] - my sister live in [place] - feels like the pinkie is getting worse , and I am afraid the middle finger are going to become like the ring and pinkie     Patient Stated Goals  I want to use my R dominant hand to pick up object, write , hold book , hold glass , and have more strength     Currently in Pain?   No/denies         [facility] OT Assessment - 11/22/17 0001      Strength   Right Hand Grip (lbs)  12    Right Hand Lateral Pinch  2.5 lbs    Left Hand Grip (lbs)  15    Left Hand Lateral Pinch  2.5 lbs      Right Hand AROM   R Thumb MCP 0-60  50 Degrees    R Thumb IP 0-80  36 Degrees    R Thumb Radial ABduction/ADduction 0-55  40    R Thumb Palmar ABduction/ADduction 0-45  50    R Index  MCP 0-90  90 Degrees    R Index PIP 0-100  90 Degrees    R Index DIP 0-70  -- PROM 70, blocked 50 and AROM 30        Pt had question if she had dupuytren's - told pt it do not appear that way -that Dr Stephenie Acres thought extensor tendons in 4th and 5th ruptured Pt showed some progress in AROM and PROM of R thumb and 2nd digit -  from early Jan to now  -see flow sheet  -  but the last week no progress.   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 thumbs  Decrease pt to every other week for about 6 wks -if she can make appt with hand surgeon in the meantime                 OT Education - 11/22/17 1126    Education provided  Yes    Education Details  lack of progress - HEP to cont with - need to follow up with a hand surgeon     Person(s) Educated  Patient    Methods  Explanation;Demonstration;Tactile cues    Comprehension  Verbalized understanding;Returned demonstration       OT Short Term Goals - 11/01/17 1333      OT SHORT TERM GOAL #1   Title  Pt to be ind in HEP to increase R  2nd digit flexion,  extention of 3rd - strength in pinch grip and increase strength at wrist to improve functional use     Baseline  2nd digit  PIP increase flexion , grip increase - but pinches same and wrist extention     Time  3    Period  Weeks    Status  On-going    Target Date  11/22/17      OT SHORT TERM GOAL #2   Title  R wrist strength increase for pt to be able to use 1 lbs weight in all planes to be able to carry glass, cut food, stabilize wrist using AD     Baseline  wrist still collapse during gripping act     Time  4    Period  Weeks    Status  On-going    Target Date  11/29/17        OT Long Term Goals - 11/01/17 1334      OT LONG TERM GOAL #1   Title  Pt to show increase lat and 3 point pinch to write, do buttons wiith more ease     Baseline  Pinch decrease bilaterally - has CMC arthritis     Status  Deferred      OT LONG TERM GOAL #2   Title  Pain and tenderness at R wrist decrease to 2/10 at the worse     Baseline  volar wrist still appear swollen  - was like it in past and hand surgeon has pt on meds     Time  4    Period  Weeks    Status  On-going    Target Date  11/29/17            Plan - 11/22/17 1127    Clinical Impression Statement  Pt did not show any progress in thumb and 2nd digits AROM since last week - cont to show decrease extention inf 3rd PIP - and at risk for flexor contracture in 4th and 5th- adivce pt that she need to see hand surgeon - pt also need to discuss with hand surgeon that she use her hand for all her mobility using crutches, rolll aid and plat  forrm waker - pt also has frequent fall and hard time pushing  up from sitting -decrease to every other week     Occupational performance deficits (Please refer to evaluation for details):  ADL's;IADL's;Play;Leisure    Rehab Potential  Fair    OT Frequency  Biweekly    OT Duration  2 weeks    OT Treatment/Interventions  Fluidtherapy;Self-care/ADL training;Splinting;Therapeutic exercise;Paraffin;Manual Therapy;Passive range of motion    Plan  assess progres and if made appt with hand surgeon    Clinical Decision Making  Several  treatment options, min-mod task modification necessary    OT Home Exercise Plan  see pt instruction     Consulted and Agree with Plan of Care  Patient       Patient will benefit from skilled therapeutic intervention in order to improve the following deficits and impairments:  Impaired flexibility, Pain, Decreased coordination, Decreased range of motion, Decreased strength, Impaired UE functional use  Visit Diagnosis: Muscle weakness (generalized)  Stiffness of right hand, not elsewhere classified  Pain in right wrist  Stiffness of right wrist, not elsewhere classified    Problem List Patient Active Problem List   Diagnosis Date Noted  . Microscopic hematuria 10/18/2015  . Advance directive discussed with patient 10/13/2014  . Neuropathy (HCC) 08/01/2012  . Hyperthyroidism   . Routine general medical examination at a health care facility 05/09/2011  . Episodic mood disorder (HCC) 12/19/2006  . Essential hypertension, benign 12/19/2006  . RBBB 12/19/2006  . ALLERGIC RHINITIS 12/19/2006  . GERD 12/19/2006  . DIVERTICULOSIS, COLON 12/19/2006    Oletta CohnuPreez, Ulas Zuercher OTR/L,CLT 11/22/2017, 4:19 PM   [facility] [address] [place], [place], [address] Phone: [phone]   Fax:  [phone]  Name: Alexandria Sherman MRN: [id number] Date of Birth: Jan 31, 1946

## 2017-11-22 NOTE — Therapy (Signed)
[facility] PH[facility]ORTS MEDICINE [address], [place], [address] Phone: [phone]   Fax:  [phone]  Physical Therapy Treatment  Patient Details  Name: Alexandria Sherman MRN: [id number] Date of Birth: [birth year] Referring Provider: Dr. Martha Clan   Encounter Date: 11/22/2017  PT End of Session - 11/22/17 1013    Visit Number  12    Number of Visits  25    Date for PT Re-Evaluation  12/20/17    PT Start Time  1003    PT Stop Time  1045    PT Time Calculation (min)  42 min    Equipment Utilized During Treatment  Gait belt    Activity Tolerance  Patient limited by fatigue    Behavior During Therapy  [facility] for tasks assessed/performed;Anxious       Past Medical History:  Diagnosis Date  . Anxiety disorder, unspecified   . Depression 1978  . Diverticulosis of colon 04/2005  . GERD (gastroesophageal reflux disease)   . Hypertension   . Hyperthyroidism    Dr Tedd Sias  . Impaired fasting glucose   . Low TSH level    chronic low, but euthyroid  . Microscopic hematuria   . RBBB   . Sleep disturbance   . Subclinical hyperthyroidism    unspecified  . Tibia fracture 05/2010   left    Past Surgical History:  Procedure Laterality Date  . ABDOMINAL HYSTERECTOMY  1988   hysterectomy/ BSO for endometriosis  . BREAST BIOPSY Right 1992   neg  . BREAST EXCISIONAL BIOPSY Right 1988   benign per pt  . KNEE ARTHROSCOPY  6/12   left--Dr Charlann Boxer    There were no vitals filed for this visit.  Subjective Assessment - 11/22/17 1006    Subjective  Patient reports she has not had any pain or falls in the past week. Patient reports she went to the grocery store this morning and used the cart to walk through the store and used her teeth to carry the bags into her home while using her crutches. Patient reports she is discouraged by her lack of progress and is not hopeful about continuing therapy at [facility], but that she will give it a try. Patietn  reports compliance with her HEP.     Limitations  House hold activities;Standing;Walking;Lifting    Patient Stated Goals  Would like to build her leg strength and stop falling.    Pain Onset  More than a month ago              Ther-Ex  -Nustep 5 min for warm-up (unbilled) -Seated heel raises w/ 3# ankle wt 3x 10 w/ cuing for eccentric motion and manual resistance -SAQ 3# 3x 12 w. 2sec hold in ext with cuing needed L>R for full excursion -Bridge w/ yellow tband 3x 12 with cuing for glute contraction -STS from elevated mat table2x 10(pt was very fatigued following grocery shopping and other therex) w/ cuing to ext fully at hip and activate glutes to stand  NeuroMuscular Re-Ed Standing balance activity -Standing feet shoulder width apart eyes open:          -30sec with min postural sway and min assist to maintain balance at 17sec,        able to regain with PT assistance;        -second trial 30sec with min postural sway       -third trial  able to maintain balance against very slight perturbation,  unable to        maintain balance without PT assistance with min lateral perturbation        -fourth trial initiated head turns which patient is unable to maintain balance        without PT minA -Standing feet shoulder width apart on airex pad x3 trails, at first unable to maintain balance without minA from PT, second trial able to maintin balance for 22 sec before requiring PT assistance to safely lower to mat table from LOB, and third trial 8 sec before LOB requiring PT assistance                  PT Education - 11/22/17 1012    Education provided  Yes    Education Details  Exercise form    Person(s) Educated  Patient    Methods  Explanation;Tactile cues;Verbal cues    Comprehension  Verbalized understanding;Returned demonstration;Verbal cues required          PT Long Term Goals - 09/27/17 1704      PT LONG TERM GOAL #1   Title  Patient will demonstrate a Berg  Balance Score of at least 30/56 to demonstrate improved balance and decreased falls risk.    Baseline  20/56    Time  12    Period  Weeks    Status  New    Target Date  12/20/17      PT LONG TERM GOAL #2   Title  Patient will transfer sit to stand from a chair with no added pillows and use of UEs to demonstrate improved LE strength and reduced falls risk.     Baseline  Requires 2 pillows underneath and heavy use of LUE to complete transfer     Time  12    Period  Weeks    Status  New    Target Date  12/20/17      PT LONG TERM GOAL #3   Title  Patient will demonstrate a TUG of less than 20 seconds with no pillows in her chair and use of UEs and AD to demonstrate reduced falls risk.     Baseline  >30" with 2 pillows underneath her in chair.     Time  12    Period  Weeks    Status  New    Target Date  12/20/17      PT LONG TERM GOAL #4   Title  Patient will report no falls x 1 week at home to demonstrate improved safety with home mobility.     Baseline  Patient reports multiple falls every week.     Time  12    Period  Weeks    Status  New    Target Date  12/20/17            Plan - 11/22/17 1028    Clinical Impression Statement  Patient tolerated therex progression well with increased fatigue following grocery shopping this morning. Patient continues to have trouble with dynamic balance requiring mat table and PT assistance to prevent falls. PT explained to patient that [facility] rehab would be better equipped to challenge and practice dynmaic balance to reduce falls/prevent furture injuries and patient seemed to agree. Patient's rehabilitaiton will continue at [facility] to continue LE strength training, and balance- especially in dynamic situations.     PT Frequency  2x / week    PT Duration  12 weeks    PT Treatment/Interventions  Balance training;Neuromuscular re-education;Gait training;DME Instruction;Aquatic  Therapy;Cryotherapy;Electrical Stimulation;Ultrasound;Moist Heat;Stair  training;Therapeutic activities;Therapeutic exercise;Patient/family education;Manual techniques;Energy conservation    PT Next Visit Plan  Continue strengthening program adding in incorporation of functional strength (and balance activities) as able    PT Home Exercise Plan  Seated clams with YTB 2 x 15 BID, hooklying bridges 2 x 15 BID, Hooklying marches 2 x 15 BID;    Consulted and Agree with Plan of Care  Patient       Patient will benefit from skilled therapeutic intervention in order to improve the following deficits and impairments:  Abnormal gait, Pain, Decreased balance, Decreased strength, Decreased activity tolerance, Difficulty walking  Visit Diagnosis: Muscle weakness (generalized)  Balance disorder     Problem List Patient Active Problem List   Diagnosis Date Noted  . Microscopic hematuria 10/18/2015  . Advance directive discussed with patient 10/13/2014  . Neuropathy (HCC) 08/01/2012  . Hyperthyroidism   . Routine general medical examination at a health care facility 05/09/2011  . Episodic mood disorder (HCC) 12/19/2006  . Essential hypertension, benign 12/19/2006  . RBBB 12/19/2006  . ALLERGIC RHINITIS 12/19/2006  . GERD 12/19/2006  . DIVERTICULOSIS, COLON 12/19/2006   Staci Acostahelsea Miller PT, DPT Staci Acostahelsea Miller 11/22/2017, 11:25 AM  [place] [facility] [facility] [address] [place], [place], [address] Phone: [phone]   Fax:  [phone]  Name: Garnette ScheuermannDonna S Machamer MRN: [id number] Date of Birth: May 20, 1946

## 2017-11-22 NOTE — Patient Instructions (Signed)
Same HEP  

## 2017-11-27 ENCOUNTER — Ambulatory Visit: Payer: Medicare Other | Attending: Orthopedic Surgery | Admitting: Physical Therapy

## 2017-11-27 ENCOUNTER — Encounter: Payer: Self-pay | Admitting: Physical Therapy

## 2017-11-27 DIAGNOSIS — R296 Repeated falls: Secondary | ICD-10-CM | POA: Diagnosis present

## 2017-11-27 DIAGNOSIS — R2689 Other abnormalities of gait and mobility: Secondary | ICD-10-CM | POA: Diagnosis present

## 2017-11-27 DIAGNOSIS — M25631 Stiffness of right wrist, not elsewhere classified: Secondary | ICD-10-CM | POA: Diagnosis present

## 2017-11-27 DIAGNOSIS — M25641 Stiffness of right hand, not elsewhere classified: Secondary | ICD-10-CM | POA: Insufficient documentation

## 2017-11-27 DIAGNOSIS — M25531 Pain in right wrist: Secondary | ICD-10-CM | POA: Diagnosis present

## 2017-11-27 DIAGNOSIS — M6281 Muscle weakness (generalized): Secondary | ICD-10-CM | POA: Diagnosis present

## 2017-11-27 NOTE — Therapy (Signed)
[facility] [facility] [address] [place], [place], [address] Phone: [phone]   Fax:  [phone]  Physical Therapy Treatment  Patient Details  Name: Alexandria Sherman MRN: [id number] Date of Birth: [birth year] Referring Provider: Dr. Martha ClanKrasinski   Encounter Date: 11/27/2017  PT End of Session - 11/27/17 1050    Visit Number  13    Number of Visits  25    Date for PT Re-Evaluation  12/20/17    PT Start Time  1035    PT Stop Time  1115    PT Time Calculation (min)  40 min    Equipment Utilized During Treatment  Gait belt    Activity Tolerance  Patient limited by fatigue    Behavior During Therapy  [facility] for tasks assessed/performed;Anxious       Past Medical History:  Diagnosis Date  . Anxiety disorder, unspecified   . Depression 1978  . Diverticulosis of colon 04/2005  . GERD (gastroesophageal reflux disease)   . Hypertension   . Hyperthyroidism    Dr Tedd SiasSolum  . Impaired fasting glucose   . Low TSH level    chronic low, but euthyroid  . Microscopic hematuria   . RBBB   . Sleep disturbance   . Subclinical hyperthyroidism    unspecified  . Tibia fracture 05/2010   left    Past Surgical History:  Procedure Laterality Date  . ABDOMINAL HYSTERECTOMY  1988   hysterectomy/ BSO for endometriosis  . BREAST BIOPSY Right 1992   neg  . BREAST EXCISIONAL BIOPSY Right 1988   benign per pt  . KNEE ARTHROSCOPY  6/12   left--Dr Charlann Boxerlin    There were no vitals filed for this visit.  Subjective Assessment - 11/27/17 1049    Subjective  Patient reports that she is not doing a HEP, she walks with her walker.     Pertinent History  d/t     Limitations  House hold activities;Standing;Walking;Lifting    Patient Stated Goals  Would like to build her leg strength and stop falling.    Currently in Pain?  No/denies    Pain Score  0-No pain    Pain Onset  More than a month ago    Multiple Pain Sites  No       Treatment: Warm up on  nu-step x 5 mins  SLR x 10 x 2 on LLE , 10 x 1 on RLE  Bridging x 20 x 2, cues to hold for 3 seconds  hooklying ER/abd with GTB x 40 , cues for correct technique  sidelying hip  Clam RLE x 20 , cues for correct technique  sidelying hip clam x 20  LLE   Hip abd supine x 20 BLE , cues for correct technique  Strength is 2+/5 B hip abd and unable to raise hips against gravity for hip abd     Performed Berg and outcome measures and reviewed goals and goal #4 was achieved with no falls in 1  Week.                   PT Education - 11/27/17 1050    Education provided  Yes    Education Details  HEP     Person(s) Educated  Patient    Methods  Explanation;Demonstration    Comprehension  Verbalized understanding;Returned demonstration          PT Long Term Goals - 11/27/17 1052  PT LONG TERM GOAL #1   Title  Patient will demonstrate a Berg Balance Score of at least 30/56 to demonstrate improved balance and decreased falls risk.    Baseline  20/56:11/27/17 20/56    Time  12    Period  Weeks    Status  On-going    Target Date  12/20/17      PT LONG TERM GOAL #2   Title  Patient will transfer sit to stand from a chair with no added pillows and use of UEs to demonstrate improved LE strength and reduced falls risk.     Baseline  Requires 2 pillows underneath and heavy use of LUE to complete transfer , continues to use the pillows    Time  12    Period  Weeks    Status  On-going    Target Date  12/21/14      PT LONG TERM GOAL #3   Title  Patient will demonstrate a TUG of less than 20 seconds with no pillows in her chair and use of UEs and AD to demonstrate reduced falls risk.     Baseline  >30" with 2 pillows underneath her in chair.  25.48 sec with 2 pillows under her chair     Time  12    Period  Weeks    Status  On-going    Target Date  12/20/17      PT LONG TERM GOAL #4   Title  Patient will report no falls x 1 week at home to demonstrate improved  safety with home mobility.     Baseline  Patient reports multiple falls every week. no falls this week     Time  12    Period  Weeks    Status  Achieved            Plan - 11/27/17 1051    Clinical Impression Statement  Patient presents with decreased gait speed, decreased balance, and decreased BLE strength. Patient's main complaint is BLE weakness and inability to participate in desired activities. Patient wants to improve balance and ability to ambulate on inclines and outdoor surfaces safely. Goals were reviewed and she is making progress towards her goals. Patient will benefit from skilled PT in order to increase gait speed, increase BLE strength, and improve dynamic standing balance to decrease risk for falls and enable patient to participate in desired activities.    Rehab Potential  Fair    PT Frequency  2x / week    PT Duration  12 weeks    PT Treatment/Interventions  Balance training;Neuromuscular re-education;Gait training;DME Instruction;Aquatic Therapy;Cryotherapy;Electrical Stimulation;Ultrasound;Moist Heat;Stair training;Therapeutic activities;Therapeutic exercise;Patient/family education;Manual techniques;Energy conservation    PT Next Visit Plan  Continue strengthening program adding in incorporation of functional strength (and balance activities) as able    PT Home Exercise Plan  Seated clams with YTB 2 x 15 BID, hooklying bridges 2 x 15 BID, Hooklying marches 2 x 15 BID;    Consulted and Agree with Plan of Care  Patient       Patient will benefit from skilled therapeutic intervention in order to improve the following deficits and impairments:  Abnormal gait, Pain, Decreased balance, Decreased strength, Decreased activity tolerance, Difficulty walking  Visit Diagnosis: Muscle weakness (generalized)  Balance disorder  Falls frequently     Problem List Patient Active Problem List   Diagnosis Date Noted  . Microscopic hematuria 10/18/2015  . Advance directive  discussed with patient 10/13/2014  . Neuropathy (HCC) 08/01/2012  .  Hyperthyroidism   . Routine general medical examination at a health care facility 05/09/2011  . Episodic mood disorder (HCC) 12/19/2006  . Essential hypertension, benign 12/19/2006  . RBBB 12/19/2006  . ALLERGIC RHINITIS 12/19/2006  . GERD 12/19/2006  . DIVERTICULOSIS, COLON 12/19/2006    Ezekiel Ina, PT DPT 11/27/2017, 11:04 AM  [facility] SERVICES [address] [place], [place], [address] Phone: [phone]   Fax:  [phone]  Name: Alexandria Sherman MRN: [id number] Date of Birth: 10/24/1945

## 2017-11-29 ENCOUNTER — Encounter: Payer: Self-pay | Admitting: Internal Medicine

## 2017-11-29 ENCOUNTER — Ambulatory Visit: Payer: Medicare Other | Admitting: Physical Therapy

## 2017-11-29 ENCOUNTER — Encounter: Payer: Self-pay | Admitting: Physical Therapy

## 2017-11-29 DIAGNOSIS — M6281 Muscle weakness (generalized): Secondary | ICD-10-CM | POA: Diagnosis not present

## 2017-11-29 DIAGNOSIS — R296 Repeated falls: Secondary | ICD-10-CM

## 2017-11-29 DIAGNOSIS — R2689 Other abnormalities of gait and mobility: Secondary | ICD-10-CM

## 2017-11-29 MED ORDER — DIVALPROEX SODIUM 125 MG PO DR TAB: 125.0000 mg | DELAYED_RELEASE_TABLET | Freq: Two times a day (BID) | ORAL | 3 refills | Status: DC

## 2017-11-29 MED ORDER — VENLAFAXINE HCL 75 MG PO TABS: 75.0000 mg | ORAL_TABLET | Freq: Two times a day (BID) | ORAL | 3 refills | Status: DC

## 2017-11-29 NOTE — Therapy (Signed)
[place] [facility] MAIN [facility] SERVICES [address] [place], [place], [address] Phone: [phone]   Fax:  [phone]  Physical Therapy Treatment  Patient Details  Name: Alexandria Sherman MRN: [id number] Date of Birth: 1946-01-06 Referring Provider: Dr. Martha Clan   Encounter Date: 11/29/2017  PT End of Session - 11/29/17 1046    Visit Number  14    Number of Visits  25    PT Start Time  1033    PT Stop Time  1115    PT Time Calculation (min)  42 min    Equipment Utilized During Treatment  Gait belt    Activity Tolerance  Patient limited by fatigue    Behavior During Therapy  [facility] for tasks assessed/performed;Anxious       Past Medical History:  Diagnosis Date  . Anxiety disorder, unspecified   . Depression 1978  . Diverticulosis of colon 04/2005  . GERD (gastroesophageal reflux disease)   . Hypertension   . Hyperthyroidism    Dr Tedd Sias  . Impaired fasting glucose   . Low TSH level    chronic low, but euthyroid  . Microscopic hematuria   . RBBB   . Sleep disturbance   . Subclinical hyperthyroidism    unspecified  . Tibia fracture 05/2010   left    Past Surgical History:  Procedure Laterality Date  . ABDOMINAL HYSTERECTOMY  1988   hysterectomy/ BSO for endometriosis  . BREAST BIOPSY Right 1992   neg  . BREAST EXCISIONAL BIOPSY Right 1988   benign per pt  . KNEE ARTHROSCOPY  6/12   left--Dr Charlann Boxer    There were no vitals filed for this visit.  Subjective Assessment - 11/29/17 1045    Subjective  Patient reports that she is doing her HEP, she walks with her walker.     Pertinent History  d/t     Limitations  House hold activities;Standing;Walking;Lifting    Patient Stated Goals  Would like to build her leg strength and stop falling.    Currently in Pain?  No/denies    Pain Score  0-No pain    Pain Onset  More than a month ago       Treatment: Warm up on octane fitness x 5 mins  SLR x 10 x 2 on LLE , 10 x 1 on  RLE  Bridging x 20 x 2, cues to hold for 3 seconds  hooklying ER/abd with GTB x 40 , cues for correct technique  sidelying hip  Clam RLE x 20 , cues for correct technique  sidelying hip clam x 20  LLE  Left Hip abd  X 15 x 2   Hip abd supine x 20 BLE , cues for correct technique  Sit to stand x 10 from lower mat table  Patient has fatigue after exercises above and needs short rest periods.                        PT Education - 11/29/17 1046    Education provided  Yes    Education Details  HEP    Person(s) Educated  Patient    Methods  Explanation;Demonstration;Tactile cues;Verbal cues    Comprehension  Verbalized understanding;Returned demonstration          PT Long Term Goals - 11/27/17 1052      PT LONG TERM GOAL #1   Title  Patient will demonstrate a Berg Balance Score of at least 30/56 to  demonstrate improved balance and decreased falls risk.    Baseline  20/56:11/27/17 20/56    Time  12    Period  Weeks    Status  On-going    Target Date  12/20/17      PT LONG TERM GOAL #2   Title  Patient will transfer sit to stand from a chair with no added pillows and use of UEs to demonstrate improved LE strength and reduced falls risk.     Baseline  Requires 2 pillows underneath and heavy use of LUE to complete transfer , continues to use the pillows    Time  12    Period  Weeks    Status  On-going    Target Date  12/21/14      PT LONG TERM GOAL #3   Title  Patient will demonstrate a TUG of less than 20 seconds with no pillows in her chair and use of UEs and AD to demonstrate reduced falls risk.     Baseline  >30" with 2 pillows underneath her in chair.  25.48 sec with 2 pillows under her chair     Time  12    Period  Weeks    Status  On-going    Target Date  12/20/17      PT LONG TERM GOAL #4   Title  Patient will report no falls x 1 week at home to demonstrate improved safety with home mobility.     Baseline  Patient reports multiple falls  every week. no falls this week     Time  12    Period  Weeks    Status  Achieved            Plan - 11/29/17 1047    Clinical Impression Statement  Patient demonstrates quad and hip abd weakness, and impaired balance that is causing difficulty with walking and mobility activities.  Patient still fatigues quickly to due very weak LE's.  Patient showed poor LE stepping  technique and is able to generate good form after heavy cueing. Patient will continue to benefit from skilled physical therapy to improve LE strength and dynamic balance to decrease risk of falls    Rehab Potential  Fair    PT Frequency  2x / week    PT Duration  12 weeks    PT Treatment/Interventions  Balance training;Neuromuscular re-education;Gait training;DME Instruction;Aquatic Therapy;Cryotherapy;Electrical Stimulation;Ultrasound;Moist Heat;Stair training;Therapeutic activities;Therapeutic exercise;Patient/family education;Manual techniques;Energy conservation    PT Next Visit Plan  Continue strengthening program adding in incorporation of functional strength (and balance activities) as able    PT Home Exercise Plan  Seated clams with YTB 2 x 15 BID, hooklying bridges 2 x 15 BID, Hooklying marches 2 x 15 BID;    Consulted and Agree with Plan of Care  Patient       Patient will benefit from skilled therapeutic intervention in order to improve the following deficits and impairments:  Abnormal gait, Pain, Decreased balance, Decreased strength, Decreased activity tolerance, Difficulty walking  Visit Diagnosis: Muscle weakness (generalized)  Balance disorder  Falls frequently     Problem List Patient Active Problem List   Diagnosis Date Noted  . Microscopic hematuria 10/18/2015  . Advance directive discussed with patient 10/13/2014  . Neuropathy (HCC) 08/01/2012  . Hyperthyroidism   . Routine general medical examination at a health care facility 05/09/2011  . Episodic mood disorder (HCC) 12/19/2006  .  Essential hypertension, benign 12/19/2006  . RBBB 12/19/2006  . ALLERGIC RHINITIS 12/19/2006  .  GERD 12/19/2006  . DIVERTICULOSIS, COLON 12/19/2006    Ezekiel Ina, PT DPT 11/29/2017, 10:49 AM  [place] [facility] [facility] MAIN [facility] SERVICES [address] [place], [place], [address] Phone: [phone]   Fax:  [phone]  Name: Alexandria Sherman MRN: [id number] Date of Birth: 1946-04-11

## 2017-12-03 ENCOUNTER — Ambulatory Visit: Payer: Medicare Other | Admitting: Physical Therapy

## 2017-12-03 ENCOUNTER — Encounter: Payer: Self-pay | Admitting: Physical Therapy

## 2017-12-03 DIAGNOSIS — R2689 Other abnormalities of gait and mobility: Secondary | ICD-10-CM

## 2017-12-03 DIAGNOSIS — R296 Repeated falls: Secondary | ICD-10-CM

## 2017-12-03 DIAGNOSIS — M6281 Muscle weakness (generalized): Secondary | ICD-10-CM

## 2017-12-03 NOTE — Therapy (Signed)
[place] [facility] MAIN [facility] SERVICES [address] [place], [place], [address] Phone: [phone]   Fax:  [phone]  Physical Therapy Treatment  Patient Details  Name: Alexandria Sherman MRN: [id number] Date of Birth: 04/27/1946 Referring Provider: Dr. Martha Clan   Encounter Date: 12/03/2017  PT End of Session - 12/03/17 1340    Visit Number  15    Number of Visits  25    PT Start Time  0102    PT Stop Time  0145    PT Time Calculation (min)  43 min    Equipment Utilized During Treatment  Gait belt    Activity Tolerance  Patient limited by fatigue    Behavior During Therapy  [facility] for tasks assessed/performed;Anxious       Past Medical History:  Diagnosis Date  . Anxiety disorder, unspecified   . Depression 1978  . Diverticulosis of colon 04/2005  . GERD (gastroesophageal reflux disease)   . Hypertension   . Hyperthyroidism    Dr Tedd Sias  . Impaired fasting glucose   . Low TSH level    chronic low, but euthyroid  . Microscopic hematuria   . RBBB   . Sleep disturbance   . Subclinical hyperthyroidism    unspecified  . Tibia fracture 05/2010   left    Past Surgical History:  Procedure Laterality Date  . ABDOMINAL HYSTERECTOMY  1988   hysterectomy/ BSO for endometriosis  . BREAST BIOPSY Right 1992   neg  . BREAST EXCISIONAL BIOPSY Right 1988   benign per pt  . KNEE ARTHROSCOPY  6/12   left--Dr Charlann Boxer    There were no vitals filed for this visit.  Subjective Assessment - 12/03/17 1333    Subjective  Patient reports that she is doing her HEP, she walks with her walker. Patient has no new concerns today.    Pertinent History  d/t     Limitations  House hold activities;Standing;Walking;Lifting    Patient Stated Goals  Would like to build her leg strength and stop falling.    Currently in Pain?  No/denies    Pain Score  0-No pain    Pain Onset  More than a month ago       Treatment:  Standing hip extension x 15 x 2 Standing hip  extension x 15 x 2  Standing hip flex x 15 x 2 Leg press 60 lbs x 20 x 2 Squats with mat behind for safety x 15 Sit to stand from various heights x 15 x 2  Patient needs cues for correct form and technique.                 PT Education - 12/03/17 1340    Education provided  Yes    Education Details  HEP    Person(s) Educated  Patient    Methods  Explanation    Comprehension  Verbalized understanding          PT Long Term Goals - 11/27/17 1052      PT LONG TERM GOAL #1   Title  Patient will demonstrate a Berg Balance Score of at least 30/56 to demonstrate improved balance and decreased falls risk.    Baseline  20/56:11/27/17 20/56    Time  12    Period  Weeks    Status  On-going    Target Date  12/20/17      PT LONG TERM GOAL #2   Title  Patient will transfer sit to  stand from a chair with no added pillows and use of UEs to demonstrate improved LE strength and reduced falls risk.     Baseline  Requires 2 pillows underneath and heavy use of LUE to complete transfer , continues to use the pillows    Time  12    Period  Weeks    Status  On-going    Target Date  12/21/14      PT LONG TERM GOAL #3   Title  Patient will demonstrate a TUG of less than 20 seconds with no pillows in her chair and use of UEs and AD to demonstrate reduced falls risk.     Baseline  >30" with 2 pillows underneath her in chair.  25.48 sec with 2 pillows under her chair     Time  12    Period  Weeks    Status  On-going    Target Date  12/20/17      PT LONG TERM GOAL #4   Title  Patient will report no falls x 1 week at home to demonstrate improved safety with home mobility.     Baseline  Patient reports multiple falls every week. no falls this week     Time  12    Period  Weeks    Status  Achieved            Plan - 12/03/17 1405    Clinical Impression Statement  Continuous verbal cues and tactile cues needed to correct form with exercises and for posture correction. Patient  required min verbal cues to perform standing exercises for posture and control and required verbal and tactile cues during all activities.  Patient is able to perform strengthening exercises without reports of  pain. Patient does need supervision during therapeutic exercises for correct from and technique. Patient will benefit from further skilled therapy to return to prior level of function.    Rehab Potential  Fair    PT Frequency  2x / week    PT Duration  12 weeks    PT Treatment/Interventions  Balance training;Neuromuscular re-education;Gait training;DME Instruction;Aquatic Therapy;Cryotherapy;Electrical Stimulation;Ultrasound;Moist Heat;Stair training;Therapeutic activities;Therapeutic exercise;Patient/family education;Manual techniques;Energy conservation    PT Next Visit Plan  Continue strengthening program adding in incorporation of functional strength (and balance activities) as able    PT Home Exercise Plan  Seated clams with YTB 2 x 15 BID, hooklying bridges 2 x 15 BID, Hooklying marches 2 x 15 BID;    Consulted and Agree with Plan of Care  Patient       Patient will benefit from skilled therapeutic intervention in order to improve the following deficits and impairments:  Abnormal gait, Pain, Decreased balance, Decreased strength, Decreased activity tolerance, Difficulty walking  Visit Diagnosis: Muscle weakness (generalized)  Balance disorder  Falls frequently     Problem List Patient Active Problem List   Diagnosis Date Noted  . Microscopic hematuria 10/18/2015  . Advance directive discussed with patient 10/13/2014  . Neuropathy (HCC) 08/01/2012  . Hyperthyroidism   . Routine general medical examination at a health care facility 05/09/2011  . Episodic mood disorder (HCC) 12/19/2006  . Essential hypertension, benign 12/19/2006  . RBBB 12/19/2006  . ALLERGIC RHINITIS 12/19/2006  . GERD 12/19/2006  . DIVERTICULOSIS, COLON 12/19/2006    Ezekiel Ina, PT  DPT 12/03/2017, 2:08 PM  [facility] [facility] MAIN [facility] SERVICES [address] [place], [place], [address] Phone: [phone]   Fax:  [phone]  Name: Alexandria Sherman  MRN: [id number] Date of Birth: [birth year]

## 2017-12-05 ENCOUNTER — Ambulatory Visit: Payer: Medicare Other | Admitting: Occupational Therapy

## 2017-12-05 DIAGNOSIS — M6281 Muscle weakness (generalized): Secondary | ICD-10-CM | POA: Diagnosis not present

## 2017-12-05 DIAGNOSIS — M25641 Stiffness of right hand, not elsewhere classified: Secondary | ICD-10-CM

## 2017-12-05 NOTE — Addendum Note (Signed)
Addended by: Oletta CohnUPREEZ, Aubriana Ravelo on: 12/05/2017 07:04 PM   Modules accepted: Orders

## 2017-12-05 NOTE — Patient Instructions (Signed)
Add some moist heat - and then PROM on table for 4th and 5th PIP extention - behind joint  And cont with others HEP

## 2017-12-05 NOTE — Therapy (Signed)
[facility]ICINE [address] [place], [address] Phone: [phone]   Fax:  [phone]  Occupational Therapy Treatment  Patient Details  Name: Alexandria Sherman MRN: [id number] Date of Birth: Jul 30, 1946 Referring Provider: Dr. Martha Clan   Encounter Date: 12/05/2017  OT End of Session - 12/05/17 1107    Visit Number  10    Number of Visits  11    Date for OT Re-Evaluation  12/19/17    OT Start Time  0940    OT Stop Time  1010    OT Time Calculation (min)  30 min    Activity Tolerance  Patient tolerated treatment well    Behavior During Therapy  [facility] for tasks assessed/performed;Anxious       Past Medical History:  Diagnosis Date  . Anxiety disorder, unspecified   . Depression 1978  . Diverticulosis of colon 04/2005  . GERD (gastroesophageal reflux disease)   . Hypertension   . Hyperthyroidism    Dr Tedd Sias  . Impaired fasting glucose   . Low TSH level    chronic low, but euthyroid  . Microscopic hematuria   . RBBB   . Sleep disturbance   . Subclinical hyperthyroidism    unspecified  . Tibia fracture 05/2010   left    Past Surgical History:  Procedure Laterality Date  . ABDOMINAL HYSTERECTOMY  1988   hysterectomy/ BSO for endometriosis  . BREAST BIOPSY Right 1992   neg  . BREAST EXCISIONAL BIOPSY Right 1988   benign per pt  . KNEE ARTHROSCOPY  6/12   left--Dr Charlann Boxer    There were no vitals filed for this visit.  Subjective Assessment - 12/05/17 1104    Subjective   I did not contact a surgeon yet - started the PT over at the hospital - my fingers is about the same - what do you think     Patient Stated Goals  I want to use my R dominant hand to pick up object, write , hold book , hold glass , and have more strength     Currently in Pain?  No/denies         [facility] OT Assessment - 12/05/17 0001      Right Hand AROM   R Thumb MCP 0-60  50 Degrees    R Thumb IP 0-80  40 Degrees    R Index   MCP 0-90  90 Degrees    R Index PIP 0-100  90 Degrees    R Index DIP 0-70  -- PROM 70, blocked 55 and AROM 35          Pt showed some progress in AROM and PROM of R thumb and 2nd digit -  from early Jan to now  -see flow sheet  -   and this past 2 wks  5 degrees improvement in IP of thumb and DIP of 2nd digit   but 4th and 5th PIP extention worse - and appear to develop flexor contracture at PIP of 5th  .   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  Pt to wear night splint for extention of 4th and 5th digits - to decrease PIP flexion contracture  And use of heat prior ot PROM for PIP extention of 4th and 5th PIP  2-3 x day   Cont with PROM of thumb and 2nd digits - AROM    same HEP as before                 OT Education - 12/05/17 1106    Education provided  Yes    Education Details  PROM for PIP extention of 4thand 5th     Person(s) Educated  Patient    Methods  Explanation;Demonstration;Tactile cues    Comprehension  Verbalized understanding       OT Short Term Goals - 11/01/17 1333      OT SHORT TERM GOAL #1   Title  Pt to be ind in HEP to increase R  2nd digit flexion,  extention of 3rd - strength in pinch grip and increase strength at wrist to improve functional use     Baseline  2nd digit PIP increase flexion , grip increase - but pinches same and wrist extention     Time  3    Period  Weeks    Status  On-going    Target Date  11/22/17      OT SHORT TERM GOAL #2   Title  R wrist strength increase for pt to be able to use 1 lbs weight in all planes to be able to carry glass, cut food, stabilize wrist using AD      Baseline  wrist still collapse during gripping act     Time  4    Period  Weeks    Status  On-going    Target Date  11/29/17        OT Long Term Goals - 11/01/17 1334      OT LONG TERM GOAL #1   Title  Pt to show increase lat and 3 point pinch to write, do buttons wiith more ease     Baseline  Pinch decrease bilaterally - has CMC arthritis     Status  Deferred      OT LONG TERM GOAL #2   Title  Pain and tenderness at R wrist decrease to 2/10 at the worse     Baseline  volar wrist still appear swollen  - was like it in past and hand surgeon has pt on meds     Time  4    Period  Weeks    Status  On-going    Target Date  11/29/17            Plan - 12/05/17 1108    Clinical Impression Statement  Pt did show increase AROM of 5 degrees at 2nd DIP and IP of thumb - pt do show decrease extention of 4th and 5th PIP - increase flexor contracture at 5th PIP - pt did not wear the last 2 wks the extention splint for 4th and 5th at night time - discuss with pt it is to maintain extention  and prevent flexor contracture of 4ht and 5th digits - will follow up one more time - pt to contact a hand surgeon for further evaluation     Occupational performance deficits (Please refer to evaluation for details):  ADL's;IADL's;Play;Leisure    OT Frequency  Biweekly    OT Duration  2 weeks    OT Treatment/Interventions  Fluidtherapy;Self-care/ADL training;Splinting;Therapeutic exercise;Paraffin;Manual  Therapy;Passive range of motion    Plan  assess progress one more time and if made appt with hand surgeon    Clinical Decision Making  Several treatment options, min-mod task modification necessary    OT Home Exercise Plan  see pt instruction     Consulted and Agree with Plan of Care  Patient       Patient will benefit from skilled therapeutic intervention in order to improve the following deficits and impairments:  Impaired flexibility, Pain, Decreased coordination, Decreased range of motion,  Decreased strength, Impaired UE functional use  Visit Diagnosis: Muscle weakness (generalized)  Stiffness of right hand, not elsewhere classified    Problem List Patient Active Problem List   Diagnosis Date Noted  . Microscopic hematuria 10/18/2015  . Advance directive discussed with patient 10/13/2014  . Neuropathy (HCC) 08/01/2012  . Hyperthyroidism   . Routine general medical examination at a health care facility 05/09/2011  . Episodic mood disorder (HCC) 12/19/2006  . Essential hypertension, benign 12/19/2006  . RBBB 12/19/2006  . ALLERGIC RHINITIS 12/19/2006  . GERD 12/19/2006  . DIVERTICULOSIS, COLON 12/19/2006    Averyanna Sax, MaureenOTR/L,CLT 12/05/2017, 3:04 PM  [place] [facility] PHYSICAL AND SPORTS MEDICINE [address], [place], [address] Phone: [phone]   Fax:  [phone]  Name: ADALAI PERL MRN: [id number] Date of Birth: 1946-08-11

## 2017-12-06 ENCOUNTER — Ambulatory Visit: Payer: Medicare Other | Admitting: Physical Therapy

## 2017-12-06 ENCOUNTER — Encounter: Payer: Self-pay | Admitting: Physical Therapy

## 2017-12-06 DIAGNOSIS — R296 Repeated falls: Secondary | ICD-10-CM

## 2017-12-06 DIAGNOSIS — M6281 Muscle weakness (generalized): Secondary | ICD-10-CM | POA: Diagnosis not present

## 2017-12-06 DIAGNOSIS — M25641 Stiffness of right hand, not elsewhere classified: Secondary | ICD-10-CM

## 2017-12-06 DIAGNOSIS — R2689 Other abnormalities of gait and mobility: Secondary | ICD-10-CM

## 2017-12-06 NOTE — Therapy (Signed)
[place] [facility] MAIN [facility] SERVICES [address] [place], [place], [address] Phone: [phone]   Fax:  [phone]  Physical Therapy Treatment  Patient Details  Name: Alexandria Sherman MRN: [id number] Date of Birth: 01-03-1946 Referring Provider: Dr. Martha Clan   Encounter Date: 12/06/2017  PT End of Session - 12/06/17 1205    Visit Number  16    Number of Visits  25    PT Start Time  1145    PT Stop Time  1225    PT Time Calculation (min)  40 min    Equipment Utilized During Treatment  Gait belt    Activity Tolerance  Patient limited by fatigue    Behavior During Therapy  [facility] for tasks assessed/performed;Anxious       Past Medical History:  Diagnosis Date  . Anxiety disorder, unspecified   . Depression 1978  . Diverticulosis of colon 04/2005  . GERD (gastroesophageal reflux disease)   . Hypertension   . Hyperthyroidism    Dr Tedd Sias  . Impaired fasting glucose   . Low TSH level    chronic low, but euthyroid  . Microscopic hematuria   . RBBB   . Sleep disturbance   . Subclinical hyperthyroidism    unspecified  . Tibia fracture 05/2010   left    Past Surgical History:  Procedure Laterality Date  . ABDOMINAL HYSTERECTOMY  1988   hysterectomy/ BSO for endometriosis  . BREAST BIOPSY Right 1992   neg  . BREAST EXCISIONAL BIOPSY Right 1988   benign per pt  . KNEE ARTHROSCOPY  6/12   left--Dr Charlann Boxer    There were no vitals filed for this visit.  Subjective Assessment - 12/06/17 1159    Subjective  Patient reports that she is doing her HEP, she walks with her walker. Patient has no new concerns today.    Pertinent History  d/t     Limitations  House hold activities;Standing;Walking;Lifting    Patient Stated Goals  Would like to build her leg strength and stop falling.    Currently in Pain?  No/denies    Pain Score  0-No pain    Pain Onset  More than a month ago      Treatment: Nu-step x 5 mins ( warm up) Standing hip extension  x 15 x 2 Standing hip extension x 15 x 2  Standing hip flex x 15 x 2 Leg press 60 lbs x 20 x 2 Squats with mat behind for safety x 15 Sit to stand from various heights x 15 x 2  LAQ with 3 second hold x 15 BLE Hip flex seated BLE x 15 at 19 inches and trying not to use the back of legs Patient needs cues for correct form and technique.  CGA and Min to mod verbal cues used throughout with increased in postural sway and LOB most seen with narrow base of support and while on uneven surfaces. Continues to have balance deficits typical with diagnosis. Patient performs intermediate level exercises without pain behaviors                     PT Education - 12/06/17 1205    Education provided  Yes    Education Details  HEP    Person(s) Educated  Patient    Methods  Explanation    Comprehension  Verbalized understanding          PT Long Term Goals - 11/27/17 1052  PT LONG TERM GOAL #1   Title  Patient will demonstrate a Berg Balance Score of at least 30/56 to demonstrate improved balance and decreased falls risk.    Baseline  20/56:11/27/17 20/56    Time  12    Period  Weeks    Status  On-going    Target Date  12/20/17      PT LONG TERM GOAL #2   Title  Patient will transfer sit to stand from a chair with no added pillows and use of UEs to demonstrate improved LE strength and reduced falls risk.     Baseline  Requires 2 pillows underneath and heavy use of LUE to complete transfer , continues to use the pillows    Time  12    Period  Weeks    Status  On-going    Target Date  12/21/14      PT LONG TERM GOAL #3   Title  Patient will demonstrate a TUG of less than 20 seconds with no pillows in her chair and use of UEs and AD to demonstrate reduced falls risk.     Baseline  >30" with 2 pillows underneath her in chair.  25.48 sec with 2 pillows under her chair     Time  12    Period  Weeks    Status  On-going    Target Date  12/20/17      PT LONG TERM GOAL #4    Title  Patient will report no falls x 1 week at home to demonstrate improved safety with home mobility.     Baseline  Patient reports multiple falls every week. no falls this week     Time  12    Period  Weeks    Status  Achieved            Plan - 12/06/17 1208    Clinical Impression Statement  Patient instructed in advanced LE strengthening and SLS balance exercise. Patient required min VCS to improve weight shift and to increase terminal knee extension for better stance control. Patient would benefit from additional skilled PT intervention to improve strength, balance and gait safety;    Rehab Potential  Fair    PT Frequency  2x / week    PT Duration  12 weeks    PT Treatment/Interventions  Balance training;Neuromuscular re-education;Gait training;DME Instruction;Aquatic Therapy;Cryotherapy;Electrical Stimulation;Ultrasound;Moist Heat;Stair training;Therapeutic activities;Therapeutic exercise;Patient/family education;Manual techniques;Energy conservation    PT Next Visit Plan  Continue strengthening program adding in incorporation of functional strength (and balance activities) as able    PT Home Exercise Plan  Seated clams with YTB 2 x 15 BID, hooklying bridges 2 x 15 BID, Hooklying marches 2 x 15 BID;    Consulted and Agree with Plan of Care  Patient       Patient will benefit from skilled therapeutic intervention in order to improve the following deficits and impairments:  Abnormal gait, Pain, Decreased balance, Decreased strength, Decreased activity tolerance, Difficulty walking  Visit Diagnosis: Muscle weakness (generalized)  Stiffness of right hand, not elsewhere classified  Balance disorder  Falls frequently     Problem List Patient Active Problem List   Diagnosis Date Noted  . Microscopic hematuria 10/18/2015  . Advance directive discussed with patient 10/13/2014  . Neuropathy (HCC) 08/01/2012  . Hyperthyroidism   . Routine general medical examination at a  health care facility 05/09/2011  . Episodic mood disorder (HCC) 12/19/2006  . Essential hypertension, benign 12/19/2006  . RBBB 12/19/2006  .  ALLERGIC RHINITIS 12/19/2006  . GERD 12/19/2006  . DIVERTICULOSIS, COLON 12/19/2006    Ezekiel InaMansfield, Jalah Warmuth S, PT DPT 12/06/2017, 12:10 PM  [facility] [address] [place], [place], [address] Phone: [phone]   Fax:  [phone]  Name: Alexandria ScheuermannDonna S Sherman MRN: [id number] Date of Birth: May 15, 1946

## 2017-12-11 ENCOUNTER — Ambulatory Visit: Payer: Medicare Other | Admitting: Physical Therapy

## 2017-12-11 ENCOUNTER — Encounter: Payer: Self-pay | Admitting: Physical Therapy

## 2017-12-11 DIAGNOSIS — M25641 Stiffness of right hand, not elsewhere classified: Secondary | ICD-10-CM

## 2017-12-11 DIAGNOSIS — M6281 Muscle weakness (generalized): Secondary | ICD-10-CM | POA: Diagnosis not present

## 2017-12-11 DIAGNOSIS — R296 Repeated falls: Secondary | ICD-10-CM

## 2017-12-11 DIAGNOSIS — R2689 Other abnormalities of gait and mobility: Secondary | ICD-10-CM

## 2017-12-11 NOTE — Therapy (Addendum)
[place] [facility] [facility] [address] [place], [place], [address] Phone: [phone]   Fax:  [phone]  Physical Therapy Treatment  Patient Details  Name: Alexandria Sherman MRN: [id number] Date of Birth: 1946/05/21 Referring Provider: Dr. Martha ClanKrasinski   Encounter Date: 12/11/2017  PT End of Session - 12/11/17 1407    Visit Number  17    Number of Visits  25    Date for PT Re-Evaluation  12/20/17    PT Start Time  0105    PT Stop Time  0150    PT Time Calculation (min)  45 min    Equipment Utilized During Treatment  Gait belt    Activity Tolerance  Patient limited by fatigue    Behavior During Therapy  [facility] for tasks assessed/performed;Anxious       Past Medical History:  Diagnosis Date  . Anxiety disorder, unspecified   . Depression 1978  . Diverticulosis of colon 04/2005  . GERD (gastroesophageal reflux disease)   . Hypertension   . Hyperthyroidism    Dr Tedd SiasSolum  . Impaired fasting glucose   . Low TSH level    chronic low, but euthyroid  . Microscopic hematuria   . RBBB   . Sleep disturbance   . Subclinical hyperthyroidism    unspecified  . Tibia fracture 05/2010   left    Past Surgical History:  Procedure Laterality Date  . ABDOMINAL HYSTERECTOMY  1988   hysterectomy/ BSO for endometriosis  . BREAST BIOPSY Right 1992   neg  . BREAST EXCISIONAL BIOPSY Right 1988   benign per pt  . KNEE ARTHROSCOPY  6/12   left--Dr Charlann Boxerlin    There were no vitals filed for this visit.  Subjective Assessment - 12/11/17 1308    Subjective  Patient reports that she is doing her HEP, she walks with her walker. Patient has no new concerns today.    Pertinent History  d/t     Limitations  House hold activities;Standing;Walking;Lifting    Patient Stated Goals  Would like to build her leg strength and stop falling.    Currently in Pain?  No/denies    Pain Score  0-No pain    Pain Onset  More than a month ago    Multiple Pain Sites  No                      Treatment: Standing : Leg press 75 lbs x 20 x 2 Squats with 5 sec hold x 20 x 2  Marching x 20 x 2 Sit to stand x 10 with SBA   Supine: BLE: Hooklying marching x 20 x 3 setsSAQ x 20  Bridging x 20 x 3 sets Heel slides x 10 x 3 sets   Patient needs cues for correct technique and form.          PT Education - 12/11/17 1317    Education provided  Yes  (Pended)     Education Details  HEP  (Pended)     Person(s) Educated  Patient  (Pended)     Methods  Explanation;Demonstration  (Pended)     Comprehension  Verbalized understanding;Returned demonstration;Verbal cues required  (Pended)           PT Long Term Goals - 11/27/17 1052      PT LONG TERM GOAL #1   Title  Patient will demonstrate a Berg Balance Score of at least 30/56 to demonstrate improved balance and decreased  falls risk.    Baseline  20/56:11/27/17 20/56    Time  12    Period  Weeks    Status  On-going    Target Date  12/20/17      PT LONG TERM GOAL #2   Title  Patient will transfer sit to stand from a chair with no added pillows and use of UEs to demonstrate improved LE strength and reduced falls risk.     Baseline  Requires 2 pillows underneath and heavy use of LUE to complete transfer , continues to use the pillows    Time  12    Period  Weeks    Status  On-going    Target Date  12/21/14      PT LONG TERM GOAL #3   Title  Patient will demonstrate a TUG of less than 20 seconds with no pillows in her chair and use of UEs and AD to demonstrate reduced falls risk.     Baseline  >30" with 2 pillows underneath her in chair.  25.48 sec with 2 pillows under her chair     Time  12    Period  Weeks    Status  On-going    Target Date  12/20/17      PT LONG TERM GOAL #4   Title  Patient will report no falls x 1 week at home to demonstrate improved safety with home mobility.     Baseline  Patient reports multiple falls every week. no falls this week     Time  12    Period   Weeks    Status  Achieved            Plan - 12/11/17 1407    Clinical Impression Statement  Patient demonstrates quad and hip abd weakness, and impaired balance that is causing difficulty with walking and mobility activities. Patient still fatigues quickly to due very weak LE's. Patient showed poor LE stepping technique and is able to generate good form after heavy cueing. Patient will continue to benefit from skilled physical therapy to improve LE strength and dynamic balance to decrease risk of falls    Rehab Potential  Fair    PT Frequency  2x / week    PT Duration  12 weeks    PT Treatment/Interventions  Balance training;Neuromuscular re-education;Gait training;DME Instruction;Aquatic Therapy;Cryotherapy;Electrical Stimulation;Ultrasound;Moist Heat;Stair training;Therapeutic activities;Therapeutic exercise;Patient/family education;Manual techniques;Energy conservation    PT Next Visit Plan  Continue strengthening program adding in incorporation of functional strength (and balance activities) as able    PT Home Exercise Plan  Seated clams with YTB 2 x 15 BID, hooklying bridges 2 x 15 BID, Hooklying marches 2 x 15 BID;    Consulted and Agree with Plan of Care  Patient       Patient will benefit from skilled therapeutic intervention in order to improve the following deficits and impairments:  Abnormal gait, Pain, Decreased balance, Decreased strength, Decreased activity tolerance, Difficulty walking  Visit Diagnosis: Muscle weakness (generalized)  Stiffness of right hand, not elsewhere classified  Balance disorder  Falls frequently     Problem List Patient Active Problem List   Diagnosis Date Noted  . Microscopic hematuria 10/18/2015  . Advance directive discussed with patient 10/13/2014  . Neuropathy (HCC) 08/01/2012  . Hyperthyroidism   . Routine general medical examination at a health care facility 05/09/2011  . Episodic mood disorder (HCC) 12/19/2006  . Essential  hypertension, benign 12/19/2006  . RBBB 12/19/2006  . ALLERGIC RHINITIS 12/19/2006  .  GERD 12/19/2006  . DIVERTICULOSIS, COLON 12/19/2006    Ezekiel Ina, PT DPT 12/11/2017, 2:34 PM  [place] [facility] MAIN [facility] SERVICES [address] [place], [place], [address] Phone: [phone]   Fax:  [phone]  Name: Alexandria Sherman MRN: [id number] Date of Birth: [birth year]

## 2017-12-13 ENCOUNTER — Encounter: Payer: Self-pay | Admitting: Physical Therapy

## 2017-12-13 ENCOUNTER — Ambulatory Visit: Payer: Medicare Other | Admitting: Physical Therapy

## 2017-12-13 DIAGNOSIS — M6281 Muscle weakness (generalized): Secondary | ICD-10-CM

## 2017-12-13 DIAGNOSIS — R2689 Other abnormalities of gait and mobility: Secondary | ICD-10-CM

## 2017-12-13 DIAGNOSIS — M25641 Stiffness of right hand, not elsewhere classified: Secondary | ICD-10-CM

## 2017-12-13 DIAGNOSIS — R296 Repeated falls: Secondary | ICD-10-CM

## 2017-12-13 NOTE — Therapy (Signed)
[facility] MAIN [facility] SERVICES [address] [place], [place], [address] Phone: [phone]   Fax:  [phone]  Physical Therapy Treatment  Patient Details  Name: TONEA LEIPHART MRN: [id number] Date of Birth: 03-24-1946 Referring Provider: Dr. Martha Clan   Encounter Date: 12/13/2017  PT End of Session - 12/13/17 1134    Visit Number  18    Number of Visits  25    Date for PT Re-Evaluation  12/20/17    PT Start Time  1100    PT Stop Time  1145    PT Time Calculation (min)  45 min    Equipment Utilized During Treatment  Gait belt    Activity Tolerance  Patient limited by fatigue    Behavior During Therapy  [facility] for tasks assessed/performed;Anxious       Past Medical History:  Diagnosis Date  . Anxiety disorder, unspecified   . Depression 1978  . Diverticulosis of colon 04/2005  . GERD (gastroesophageal reflux disease)   . Hypertension   . Hyperthyroidism    Dr Tedd Sias  . Impaired fasting glucose   . Low TSH level    chronic low, but euthyroid  . Microscopic hematuria   . RBBB   . Sleep disturbance   . Subclinical hyperthyroidism    unspecified  . Tibia fracture 05/2010   left    Past Surgical History:  Procedure Laterality Date  . ABDOMINAL HYSTERECTOMY  1988   hysterectomy/ BSO for endometriosis  . BREAST BIOPSY Right 1992   neg  . BREAST EXCISIONAL BIOPSY Right 1988   benign per pt  . KNEE ARTHROSCOPY  6/12   left--Dr Charlann Boxer    There were no vitals filed for this visit.  Subjective Assessment - 12/13/17 1133    Subjective  Patient reports that she is doing her HEP, she walks with her walker. Patient has no new concerns today.    Pertinent History  d/t     Limitations  House hold activities;Standing;Walking;Lifting    Patient Stated Goals  Would like to build her leg strength and stop falling.    Currently in Pain?  No/denies    Pain Score  0-No pain    Pain Onset  More than a month ago      Treatment: Leg press 75  lbs x 20 x 2 Mini squats with chair ( with foam pad ) x 15 reps Standing hip abd x 15 BLE x 2 sets Standing in parallel bars hip ext x 15 x 2 sets Seated hip flex x 15 x 2 Seated LAQ x 15 x 2  Seated hip ER/abd x 15 x 2 with RTB Nu-step x 5 mins ( warm up )  Sit to stand from various mat heights x 10 , cues to not stabilize with back of legs if possible Patient needs CGA and min cues for correct form and technique.                         PT Education - 12/13/17 1134    Education provided  Yes    Education Details  safety with transfers    Person(s) Educated  Patient    Methods  Explanation;Demonstration    Comprehension  Verbalized understanding;Returned demonstration          PT Long Term Goals - 11/27/17 1052      PT LONG TERM GOAL #1   Title  Patient will demonstrate a Programmer, systems  Score of at least 30/56 to demonstrate improved balance and decreased falls risk.    Baseline  20/56:11/27/17 20/56    Time  12    Period  Weeks    Status  On-going    Target Date  12/20/17      PT LONG TERM GOAL #2   Title  Patient will transfer sit to stand from a chair with no added pillows and use of UEs to demonstrate improved LE strength and reduced falls risk.     Baseline  Requires 2 pillows underneath and heavy use of LUE to complete transfer , continues to use the pillows    Time  12    Period  Weeks    Status  On-going    Target Date  12/21/14      PT LONG TERM GOAL #3   Title  Patient will demonstrate a TUG of less than 20 seconds with no pillows in her chair and use of UEs and AD to demonstrate reduced falls risk.     Baseline  >30" with 2 pillows underneath her in chair.  25.48 sec with 2 pillows under her chair     Time  12    Period  Weeks    Status  On-going    Target Date  12/20/17      PT LONG TERM GOAL #4   Title  Patient will report no falls x 1 week at home to demonstrate improved safety with home mobility.     Baseline  Patient reports  multiple falls every week. no falls this week     Time  12    Period  Weeks    Status  Achieved            Plan - 12/13/17 1136    Clinical Impression Statement  Patient required min verbal cueing during strengthening exercise to correct posture and form. Patient demonstrates ability to perform strengthening exercises  with no pain but with moderate fatigue. Patient will continue to benefit from continued skilled therapy in order to improve dynamic standing balance and increase strength in order to decrease falls    Rehab Potential  Fair    PT Frequency  2x / week    PT Duration  12 weeks    PT Treatment/Interventions  Balance training;Neuromuscular re-education;Gait training;DME Instruction;Aquatic Therapy;Cryotherapy;Electrical Stimulation;Ultrasound;Moist Heat;Stair training;Therapeutic activities;Therapeutic exercise;Patient/family education;Manual techniques;Energy conservation    PT Next Visit Plan  Continue strengthening program adding in incorporation of functional strength (and balance activities) as able    PT Home Exercise Plan  Seated clams with YTB 2 x 15 BID, hooklying bridges 2 x 15 BID, Hooklying marches 2 x 15 BID;    Consulted and Agree with Plan of Care  Patient       Patient will benefit from skilled therapeutic intervention in order to improve the following deficits and impairments:  Abnormal gait, Pain, Decreased balance, Decreased strength, Decreased activity tolerance, Difficulty walking  Visit Diagnosis: Muscle weakness (generalized)  Stiffness of right hand, not elsewhere classified  Balance disorder  Falls frequently     Problem List Patient Active Problem List   Diagnosis Date Noted  . Microscopic hematuria 10/18/2015  . Advance directive discussed with patient 10/13/2014  . Neuropathy (HCC) 08/01/2012  . Hyperthyroidism   . Routine general medical examination at a health care facility 05/09/2011  . Episodic mood disorder (HCC) 12/19/2006  .  Essential hypertension, benign 12/19/2006  . RBBB 12/19/2006  . ALLERGIC RHINITIS 12/19/2006  .  GERD 12/19/2006  . DIVERTICULOSIS, COLON 12/19/2006    Ezekiel InaMansfield, Anelia Carriveau S, PT DPT 12/13/2017, 11:40 AM  [place] [facility] [address] [place], [place], [address] Phone: [phone]   Fax:  [phone]  Name: Garnette ScheuermannDonna S Coronado MRN: [id number] Date of Birth: [birth year]

## 2017-12-18 ENCOUNTER — Ambulatory Visit: Payer: Medicare Other

## 2017-12-18 VITALS — BP 122/78 | HR 84

## 2017-12-18 DIAGNOSIS — R2689 Other abnormalities of gait and mobility: Secondary | ICD-10-CM

## 2017-12-18 DIAGNOSIS — M6281 Muscle weakness (generalized): Secondary | ICD-10-CM

## 2017-12-18 NOTE — Therapy (Signed)
[facility] [address] [place], [place], [address] Phone: [phone]   Fax:  [phone]  Physical Therapy Treatment  Patient Details  Name: Alexandria Sherman MRN: [id number] Date of Birth: 23-Jul-1946 Referring Provider: Dr. Martha ClanKrasinski   Encounter Date: 12/18/2017  PT End of Session - 12/18/17 1039    Visit Number  19    Number of Visits  25    Date for PT Re-Evaluation  12/20/17    PT Start Time  1030    PT Stop Time  1115    PT Time Calculation (min)  45 min    Equipment Utilized During Treatment  Gait belt    Activity Tolerance  Patient limited by fatigue    Behavior During Therapy  [facility] for tasks assessed/performed;Anxious       Past Medical History:  Diagnosis Date  . Anxiety disorder, unspecified   . Depression 1978  . Diverticulosis of colon 04/2005  . GERD (gastroesophageal reflux disease)   . Hypertension   . Hyperthyroidism    Dr Tedd SiasSolum  . Impaired fasting glucose   . Low TSH level    chronic low, but euthyroid  . Microscopic hematuria   . RBBB   . Sleep disturbance   . Subclinical hyperthyroidism    unspecified  . Tibia fracture 05/2010   left    Past Surgical History:  Procedure Laterality Date  . ABDOMINAL HYSTERECTOMY  1988   hysterectomy/ BSO for endometriosis  . BREAST BIOPSY Right 1992   neg  . BREAST EXCISIONAL BIOPSY Right 1988   benign per pt  . KNEE ARTHROSCOPY  6/12   left--Dr Charlann Boxerlin    Vitals:   12/18/17 1037  BP: 122/78  Pulse: 84  SpO2: 99%    Subjective Assessment - 12/18/17 1037    Subjective  Pt states that she is doing well on this date. She is performing her HEP. No recent falls. No questions or concerns.     Limitations  House hold activities;Standing;Walking;Lifting    Patient Stated Goals  Would like to build her leg strength and stop falling.    Currently in Pain?  No/denies                 No data recorded   TREATMENT   Ther-ex  NuStep L2 x 5  minutes for warm-up during history; Mini squats with chair ( with foam pad ) 2 x 10 reps; Standing hip abd x 10 BLE x 2 sets Standing in parallel bars hip ext x 10 x 2 sets Seated hip flex x 10 x 2 Seated LAQ x 10 x 2  Seated hip ER/abd x 10 x 2 with manual resistance Sit to stand from various mat heights 2 x 5, cues for proper sequencing;  Patient needs CGA and min cues for correct form and technique.             PT Education - 12/18/17 1037    Education provided  Yes    Education Details  exercise form/technique    Person(s) Educated  Patient    Methods  Explanation;Demonstration    Comprehension  Verbalized understanding          PT Long Term Goals - 11/27/17 1052      PT LONG TERM GOAL #1   Title  Patient will demonstrate a Berg Balance Score of at least 30/56 to demonstrate improved balance and decreased falls risk.    Baseline  20/56:11/27/17 20/56  Time  12    Period  Weeks    Status  On-going    Target Date  12/20/17      PT LONG TERM GOAL #2   Title  Patient will transfer sit to stand from a chair with no added pillows and use of UEs to demonstrate improved LE strength and reduced falls risk.     Baseline  Requires 2 pillows underneath and heavy use of LUE to complete transfer , continues to use the pillows    Time  12    Period  Weeks    Status  On-going    Target Date  12/21/14      PT LONG TERM GOAL #3   Title  Patient will demonstrate a TUG of less than 20 seconds with no pillows in her chair and use of UEs and AD to demonstrate reduced falls risk.     Baseline  >30" with 2 pillows underneath her in chair.  25.48 sec with 2 pillows under her chair     Time  12    Period  Weeks    Status  On-going    Target Date  12/20/17      PT LONG TERM GOAL #4   Title  Patient will report no falls x 1 week at home to demonstrate improved safety with home mobility.     Baseline  Patient reports multiple falls every week. no falls this week     Time  12     Period  Weeks    Status  Achieved            Plan - 12/18/17 1039    Clinical Impression Statement  Pt demonstrates good motivation with therapy on this date. She continues to demonstrate persistent bilateral LE weakness. Struggles with sit to stand and uses back of knees to push against mat table. Pt encouraged to continue HEP and follow-up as scheduled.    Rehab Potential  Fair    PT Frequency  2x / week    PT Duration  12 weeks    PT Treatment/Interventions  Balance training;Neuromuscular re-education;Gait training;DME Instruction;Aquatic Therapy;Cryotherapy;Electrical Stimulation;Ultrasound;Moist Heat;Stair training;Therapeutic activities;Therapeutic exercise;Patient/family education;Manual techniques;Energy conservation    PT Next Visit Plan  Continue strengthening program adding in incorporation of functional strength (and balance activities) as able    PT Home Exercise Plan  Seated clams with YTB 2 x 15 BID, hooklying bridges 2 x 15 BID, Hooklying marches 2 x 15 BID;    Consulted and Agree with Plan of Care  Patient       Patient will benefit from skilled therapeutic intervention in order to improve the following deficits and impairments:  Abnormal gait, Pain, Decreased balance, Decreased strength, Decreased activity tolerance, Difficulty walking  Visit Diagnosis: Muscle weakness (generalized)  Balance disorder     Problem List Patient Active Problem List   Diagnosis Date Noted  . Microscopic hematuria 10/18/2015  . Advance directive discussed with patient 10/13/2014  . Neuropathy (HCC) 08/01/2012  . Hyperthyroidism   . Routine general medical examination at a health care facility 05/09/2011  . Episodic mood disorder (HCC) 12/19/2006  . Essential hypertension, benign 12/19/2006  . RBBB 12/19/2006  . ALLERGIC RHINITIS 12/19/2006  . GERD 12/19/2006  . DIVERTICULOSIS, COLON 12/19/2006   Lynnea Maizes PT, DPT   Jasaiah Karwowski 12/18/2017, 12:55 PM  [facility] MAIN [facility] SERVICES [address] [place], [place], [address] Phone: [phone]   Fax:  [phone]  Name: Alexandria Sherman MRN: [id number] Date of Birth: 1946/02/07

## 2017-12-19 ENCOUNTER — Ambulatory Visit: Payer: Medicare Other | Admitting: Occupational Therapy

## 2017-12-19 DIAGNOSIS — M6281 Muscle weakness (generalized): Secondary | ICD-10-CM

## 2017-12-19 DIAGNOSIS — M25641 Stiffness of right hand, not elsewhere classified: Secondary | ICD-10-CM

## 2017-12-19 DIAGNOSIS — M25631 Stiffness of right wrist, not elsewhere classified: Secondary | ICD-10-CM

## 2017-12-19 DIAGNOSIS — M25531 Pain in right wrist: Secondary | ICD-10-CM

## 2017-12-19 NOTE — Patient Instructions (Signed)
Pt to cont with same ROM HEP  And splint wearing at night time be prevent flexor contracture of 4th and 5th digits

## 2017-12-19 NOTE — Therapy (Signed)
[facility] PHYSICAL AND SPORTS MEDICINE [address] [place], [address] Phone: [phone]   Fax:  [phone]  Occupational Therapy Treatment/discharge  Patient Details  Name: Alexandria Sherman MRN: [id number] Date of Birth: 09-16-1946 Referring Provider: Dr. Mack Guise   Encounter Date: 12/19/2017  OT End of Session - 12/19/17 1839    Visit Number  11    Number of Visits  11    Date for OT Re-Evaluation  12/19/17    OT Start Time  1000    OT Stop Time  1038    OT Time Calculation (min)  38 min    Activity Tolerance  Patient tolerated treatment well    Behavior During Therapy  [facility] for tasks assessed/performed;Anxious       Past Medical History:  Diagnosis Date  . Anxiety disorder, unspecified   . Depression 1978  . Diverticulosis of colon 04/2005  . GERD (gastroesophageal reflux disease)   . Hypertension   . Hyperthyroidism    Dr Gabriel Carina  . Impaired fasting glucose   . Low TSH level    chronic low, but euthyroid  . Microscopic hematuria   . RBBB   . Sleep disturbance   . Subclinical hyperthyroidism    unspecified  . Tibia fracture 05/2010   left    Past Surgical History:  Procedure Laterality Date  . ABDOMINAL HYSTERECTOMY  1988   hysterectomy/ BSO for endometriosis  . BREAST BIOPSY Right 1992   neg  . BREAST EXCISIONAL BIOPSY Right 1988   benign per pt  . KNEE ARTHROSCOPY  6/12   left--Dr Alvan Dame    There were no vitals filed for this visit.  Subjective Assessment - 12/19/17 1836    Subjective   I don't know how my hand is going to look- feels like my pointer finger is not bending as good - I did bring the splint for you to look at - I did not do as much exercises - I had my bathroom done - walk in shower, built in seat , hand held shower and rails     Patient Stated Goals  I want to use my R dominant hand to pick up object, write , hold book , hold glass , and have more strength     Currently in Pain?  No/denies          [facility] OT Assessment - 12/19/17 0001      Right Hand AROM   R Thumb MCP 0-60  50 Degrees    R Thumb IP 0-80  42 Degrees       Assess AROM for thumb , 2nd digit and 4th and 5th  - compare to last time  Same at thumb and 2nd digit   pt at risk for flexor contractures at 4th and 5th digits  Pt brought in her volar extention splint for 4th and 5th - hand base   pt was trying to slide it on  Replace straps - and made longer so she has easier time pulling and grasping straps  with  L hand - and fasten - not trying to slide fingers in  Pt show good pressure proximal to PIP's on 5th and 4th   pt donn - 2 x with more ease  To use at night time   Review with pt importance to maintain thumb and 2nd/3rd digit ROM  And then pt high risk for falling - pt did had her bathroom redone this past week or  2  See subjective  Discuss and reinforce importance to do HEP for PT for LE strengthening - for her to prevent falling - improve balance - and decrease risk for fracture  So she can age in place    Also to discuss when she see hand surgeon - about being dependent on her hands using her walker, crutches , and roll aid.               OT Education - 12/19/17 1838    Education provided  Yes    Education Details  HEP - splint wearing     Person(s) Educated  Patient    Methods  Explanation;Demonstration;Tactile cues    Comprehension  Returned demonstration;Verbalized understanding       OT Short Term Goals - 12/19/17 1845      OT SHORT TERM GOAL #1   Status  Partially Met      OT SHORT TERM GOAL #2   Title  R wrist strength increase for pt to be able to use 1 lbs weight in all planes to be able to carry glass, cut food, stabilize wrist using AD     Status  Partially Met        OT Long Term Goals - 12/19/17 1846      OT LONG TERM GOAL #1   Title  Pt to show increase lat and 3 point pinch to write, do buttons wiith more ease     Baseline  Pinch decrease bilaterally -  has CMC arthritis     Status  Partially Met      OT LONG TERM GOAL #2   Title  Pain and tenderness at R wrist decrease to 2/10 at the worse     Status  Achieved            Plan - 12/19/17 1839    Clinical Impression Statement  Pt was able to maintain AROM and PROM of R hand digits with ROM and splint wearing -did modify splint straps for pt to donn easier and get pressure of strap proximal to PIP- and longer strap to be able to pinch with more ease - pt will be discharte at this time from OT - recommend she need to follow up with and surgeon - did discuss in length that pt need do PT HEP to increase strength in LE and decrease falling - and prevent future fx - and to be safe at home if she wants to age  at home - she agree fully - also recommend pt to discuss with hand surgeon her dependency on using her hands  with her walker, crutches and roll-aid - pt  in agreement     Plan  pt discharge with HEP and refer to follow up with a hand surgeon    OT Home Exercise Plan  see pt instruction     Consulted and Agree with Plan of Care  Patient       Patient will benefit from skilled therapeutic intervention in order to improve the following deficits and impairments:     Visit Diagnosis: Muscle weakness (generalized)  Pain in right wrist  Stiffness of right wrist, not elsewhere classified  Stiffness of right hand, not elsewhere classified    Problem List Patient Active Problem List   Diagnosis Date Noted  . Microscopic hematuria 10/18/2015  . Advance directive discussed with patient 10/13/2014  . Neuropathy (HCC) 08/01/2012  . Hyperthyroidism   . Routine general medical examination at a health   care facility 05/09/2011  . Episodic mood disorder (HCC) 12/19/2006  . Essential hypertension, benign 12/19/2006  . RBBB 12/19/2006  . ALLERGIC RHINITIS 12/19/2006  . GERD 12/19/2006  . DIVERTICULOSIS, COLON 12/19/2006    ,  OTR/L,CLT  12/19/2017, 6:47 PM  [facility]ICINE [address] [place], [place], [address] Phone: [phone]   Fax:  [phone]  Name: Alexandria Sherman MRN: [id number] Date of Birth: 10/10/1945 

## 2017-12-20 ENCOUNTER — Other Ambulatory Visit: Payer: Self-pay | Admitting: Internal Medicine

## 2017-12-20 ENCOUNTER — Other Ambulatory Visit: Payer: Self-pay

## 2017-12-20 ENCOUNTER — Ambulatory Visit: Payer: Medicare Other

## 2017-12-20 VITALS — BP 158/77 | HR 83

## 2017-12-20 DIAGNOSIS — R2689 Other abnormalities of gait and mobility: Secondary | ICD-10-CM

## 2017-12-20 DIAGNOSIS — M6281 Muscle weakness (generalized): Secondary | ICD-10-CM

## 2017-12-20 MED ORDER — MOMETASONE FUROATE 0.1 % EX CREA: 1.0000 "application " | TOPICAL_CREAM | Freq: Two times a day (BID) | CUTANEOUS | 1 refills | Status: DC | PRN

## 2017-12-20 NOTE — Telephone Encounter (Signed)
Pt is returning lisa call

## 2017-12-20 NOTE — Telephone Encounter (Signed)
Pt calling with c/o red rash to her inner upper thighs. Stated that she was prescribed the med a while ago but she is needing more. She stated that she will use 3-4 days and the rash goes away. It was last filled 10/03/2014. The med is Elocon and it was prescribed to apply topically 2 times a day prn itching.

## 2017-12-20 NOTE — Telephone Encounter (Signed)
Approved: okay #45 gm x 1

## 2017-12-20 NOTE — Telephone Encounter (Signed)
Patient called and left VM to return call to discuss symptoms and ask to speak to a triage nurse.

## 2017-12-20 NOTE — Telephone Encounter (Signed)
Requesting refill elocon to [facility]; last refilled # 45 g on 10/13/14; pt last annual 10/22/17.Please advise.

## 2017-12-20 NOTE — Telephone Encounter (Signed)
Patient called, left VM to return the call to the office to discuss symptoms for using elocon cream.

## 2017-12-20 NOTE — Therapy (Signed)
[facility] [place] MAIN [facility] SERVICES [address] [place], [place], [address] Phone: [phone]   Fax:  [phone]  Physical Therapy Treatment  Patient Details  Name: Alexandria Sherman MRN: [id number] Date of Birth: 26-May-1946 Referring Provider: Dr. Martha Clan   Encounter Date: 12/20/2017  PT End of Session - 12/20/17 1644    Visit Number  20    Number of Visits  25    Date for PT Re-Evaluation  12/20/17    PT Start Time  1430    PT Stop Time  1515    PT Time Calculation (min)  45 min    Equipment Utilized During Treatment  Gait belt    Activity Tolerance  Patient limited by fatigue    Behavior During Therapy  [facility] for tasks assessed/performed;Anxious       Past Medical History:  Diagnosis Date  . Anxiety disorder, unspecified   . Depression 1978  . Diverticulosis of colon 04/2005  . GERD (gastroesophageal reflux disease)   . Hypertension   . Hyperthyroidism    Dr Tedd Sias  . Impaired fasting glucose   . Low TSH level    chronic low, but euthyroid  . Microscopic hematuria   . RBBB   . Sleep disturbance   . Subclinical hyperthyroidism    unspecified  . Tibia fracture 05/2010   left    Past Surgical History:  Procedure Laterality Date  . ABDOMINAL HYSTERECTOMY  1988   hysterectomy/ BSO for endometriosis  . BREAST BIOPSY Right 1992   neg  . BREAST EXCISIONAL BIOPSY Right 1988   benign per pt  . KNEE ARTHROSCOPY  6/12   left--Dr Charlann Boxer    Vitals:   12/20/17 1438  BP: (!) 158/77  Pulse: 83  SpO2: 97%    Subjective Assessment - 12/20/17 1643    Subjective  Pt states that she is doing well on this date. She is performing her HEP. No recent falls. No questions or concerns.     Limitations  House hold activities;Standing;Walking;Lifting    Patient Stated Goals  Would like to build her leg strength and stop falling.    Currently in Pain?  No/denies                   TREATMENT   Ther-ex  NuStep L2, seat position 7,  handle position 9, x 7 minutes for warm-up during history (5 minutes unbilled); Mini squats with chair ( with foam pad ) 2 x 10 reps; Standing marches 2 x 10 BLE; Standing hip abd x 10 BLE x 2 sets Standing in parallel bars hip ext x 10 x 2 sets Seated hip flex 2 x 10; Seated LAQ 2 x 10; Seated hip ER/abd x 10 x 2 with manual resistance; Seated adductor squeezes 2 x 10;  Patient needs CGA and min cues for correct form and technique.             PT Education - 12/20/17 1643    Education provided  Yes    Education Details  Reinforced HEP    Person(s) Educated  Patient    Methods  Explanation    Comprehension  Verbalized understanding          PT Long Term Goals - 11/27/17 1052      PT LONG TERM GOAL #1   Title  Patient will demonstrate a Berg Balance Score of at least 30/56 to demonstrate improved balance and decreased falls risk.    Baseline  20/56:11/27/17 20/56    Time  12    Period  Weeks    Status  On-going    Target Date  12/20/17      PT LONG TERM GOAL #2   Title  Patient will transfer sit to stand from a chair with no added pillows and use of UEs to demonstrate improved LE strength and reduced falls risk.     Baseline  Requires 2 pillows underneath and heavy use of LUE to complete transfer , continues to use the pillows    Time  12    Period  Weeks    Status  On-going    Target Date  12/21/14      PT LONG TERM GOAL #3   Title  Patient will demonstrate a TUG of less than 20 seconds with no pillows in her chair and use of UEs and AD to demonstrate reduced falls risk.     Baseline  >30" with 2 pillows underneath her in chair.  25.48 sec with 2 pillows under her chair     Time  12    Period  Weeks    Status  On-going    Target Date  12/20/17      PT LONG TERM GOAL #4   Title  Patient will report no falls x 1 week at home to demonstrate improved safety with home mobility.     Baseline  Patient reports multiple falls every week. no falls this week      Time  12    Period  Weeks    Status  Achieved            Plan - 12/20/17 1644    Clinical Impression Statement  Pt continues to demonstrate good motivation with therapy on this date. She has persistent bilateral LE weakness. She continues to require considerable UE support to come up to standing. Pt provided education about where she can purchase a wheelchair cushion to utilize on chairs when she is out in the community and with friends to ease her ability to stand from chairs. Pt encouraged to continue HEP and follow-up as scheduled.     Rehab Potential  Fair    PT Frequency  2x / week    PT Duration  12 weeks    PT Treatment/Interventions  Balance training;Neuromuscular re-education;Gait training;DME Instruction;Aquatic Therapy;Cryotherapy;Electrical Stimulation;Ultrasound;Moist Heat;Stair training;Therapeutic activities;Therapeutic exercise;Patient/family education;Manual techniques;Energy conservation    PT Next Visit Plan  Repeat outcome measures, update goals, recert; Continue strengthening program adding in incorporation of functional strength (and balance activities) as able    PT Home Exercise Plan  Seated clams with YTB 2 x 15 BID, hooklying bridges 2 x 15 BID, Hooklying marches 2 x 15 BID;    Consulted and Agree with Plan of Care  Patient       Patient will benefit from skilled therapeutic intervention in order to improve the following deficits and impairments:  Abnormal gait, Pain, Decreased balance, Decreased strength, Decreased activity tolerance, Difficulty walking  Visit Diagnosis: Muscle weakness (generalized)  Balance disorder     Problem List Patient Active Problem List   Diagnosis Date Noted  . Microscopic hematuria 10/18/2015  . Advance directive discussed with patient 10/13/2014  . Neuropathy (HCC) 08/01/2012  . Hyperthyroidism   . Routine general medical examination at a health care facility 05/09/2011  . Episodic mood disorder (HCC) 12/19/2006  .  Essential hypertension, benign 12/19/2006  . RBBB 12/19/2006  . ALLERGIC RHINITIS 12/19/2006  . GERD 12/19/2006  .  DIVERTICULOSIS, COLON 12/19/2006   Lynnea Maizes PT, DPT   [name] 12/20/2017, 4:47 PM  [facility] MAIN [facility] SERVICES [address] [place], [place], [address] Phone: [phone]   Fax:  [phone]  Name: Alexandria Sherman MRN: [id number] Date of Birth: 1946/03/30

## 2017-12-20 NOTE — Telephone Encounter (Signed)
Copied from CRM [id number]. Topic: Quick Communication - Rx Refill/Question >> Dec 20, 2017  1:09 PM Alexander BergeronBarksdale, Harvey B wrote: Medication: mometasone (ELOCON) 0.1 % cream [[id number]][[id number]]  Has the patient contacted their pharmacy? Yes.   (Agent: If no, request that the patient contact the pharmacy for the refill.) Preferred Pharmacy (with phone number or street name): [email or web address] Agent: Please be advised that RX refills may take up to 3 business days. We ask that you follow-up with your pharmacy.

## 2017-12-20 NOTE — Telephone Encounter (Signed)
See 2nd telephone encounter from 3/28

## 2017-12-25 ENCOUNTER — Encounter: Payer: Self-pay | Admitting: Physical Therapy

## 2017-12-25 ENCOUNTER — Ambulatory Visit: Payer: Medicare Other | Attending: Orthopedic Surgery | Admitting: Physical Therapy

## 2017-12-25 DIAGNOSIS — R296 Repeated falls: Secondary | ICD-10-CM | POA: Diagnosis present

## 2017-12-25 DIAGNOSIS — R2689 Other abnormalities of gait and mobility: Secondary | ICD-10-CM | POA: Diagnosis present

## 2017-12-25 DIAGNOSIS — M25631 Stiffness of right wrist, not elsewhere classified: Secondary | ICD-10-CM | POA: Insufficient documentation

## 2017-12-25 DIAGNOSIS — M25531 Pain in right wrist: Secondary | ICD-10-CM

## 2017-12-25 DIAGNOSIS — M25641 Stiffness of right hand, not elsewhere classified: Secondary | ICD-10-CM | POA: Insufficient documentation

## 2017-12-25 DIAGNOSIS — M6281 Muscle weakness (generalized): Secondary | ICD-10-CM | POA: Diagnosis not present

## 2017-12-25 NOTE — Therapy (Signed)
[place] MAIN [facility] SERVICES [address] [place], [place], [address] Phone: [phone]   Fax:  [phone]  Physical Therapy Treatment  Patient Details  Name: Alexandria Sherman MRN: [id number] Date of Birth: October 07, 1945 Referring Provider: Dr. Mack Guise   Encounter Date: 12/25/2017  PT End of Session - 12/25/17 1132    Visit Number  20    Number of Visits  25    Date for PT Re-Evaluation  02/14/18    PT Start Time  1115    PT Stop Time  1200    PT Time Calculation (min)  45 min    Equipment Utilized During Treatment  Gait belt    Activity Tolerance  Patient limited by fatigue    Behavior During Therapy  [facility] for tasks assessed/performed;Anxious       Past Medical History:  Diagnosis Date  . Anxiety disorder, unspecified   . Depression 1978  . Diverticulosis of colon 04/2005  . GERD (gastroesophageal reflux disease)   . Hypertension   . Hyperthyroidism    Dr Gabriel Carina  . Impaired fasting glucose   . Low TSH level    chronic low, but euthyroid  . Microscopic hematuria   . RBBB   . Sleep disturbance   . Subclinical hyperthyroidism    unspecified  . Tibia fracture 05/2010   left    Past Surgical History:  Procedure Laterality Date  . ABDOMINAL HYSTERECTOMY  1988   hysterectomy/ BSO for endometriosis  . BREAST BIOPSY Right 1992   neg  . BREAST EXCISIONAL BIOPSY Right 1988   benign per pt  . KNEE ARTHROSCOPY  6/12   left--Dr Alvan Dame    There were no vitals filed for this visit.  Subjective Assessment - 12/25/17 1126    Subjective  Pt states that she is doing well on this date. She wants to review her HEP. No recent falls. No questions or concerns.     Pertinent History  d/t     Limitations  House hold activities;Standing;Walking;Lifting    Patient Stated Goals  Would like to build her leg strength and stop falling.    Currently in Pain?  No/denies    Pain Score  0-No pain    Pain Onset  More than a month ago    Multiple Pain  Sites  No       Treatment:  Sit to stand x 5 with 1 pillow and definite use of hands   hooklying hip flex x 20 BLE  Outcome measures performed : TUG, Berg balance test   Goals reviewed : Goals were reviewed and new goal made for loftstrand crutches.                            PT Education - 12/25/17 1132    Education provided  Yes    Education Details  Reviewed HEP    Person(s) Educated  Patient    Methods  Explanation;Demonstration;Tactile cues    Comprehension  Verbalized understanding;Returned demonstration          PT Long Term Goals - 12/25/17 1136      PT LONG TERM GOAL #1   Title  Patient will demonstrate a Berg Balance Score of at least 30/56 to demonstrate improved balance and decreased falls risk.    Baseline  20/56:11/27/17 20/56:  20/ 56     Time  12    Period  Weeks    Status  On-going    Target Date  01/24/18      PT LONG TERM GOAL #2   Title  Patient will transfer sit to stand from a chair with no added pillows and use of UEs to demonstrate improved LE strength and reduced falls risk.     Baseline  Requires 2 pillows underneath and heavy use of LUE to complete transfer , continues to use the pillows    Time  12    Period  Weeks    Status  On-going    Target Date  01/24/18      PT LONG TERM GOAL #3   Title  Patient will demonstrate a TUG of less than 20 seconds with no pillows in her chair and use of UEs and AD to demonstrate reduced falls risk.     Baseline  >30" with 2 pillows underneath her in chair.  25.48 sec with 2 pillows under her chair ;20.09 one pillow    Time  12    Period  Weeks    Status  On-going    Target Date  01/24/18      PT LONG TERM GOAL #4   Title  Patient will report no falls x 1 week at home to demonstrate improved safety with home mobility.     Baseline  Patient reports multiple falls every week. no falls this week ; Patient had one fall in the last month    Time  12    Period  Weeks    Status   Partially Met    Target Date  01/24/18      PT LONG TERM GOAL #5   Title  Patient will be modified independent with loftstrand crutches for outdoor ambulation and for sit to stand transfers.     Time  4    Period  Weeks    Status  New    Target Date  01/24/18            Plan - 12/25/17 1218    Clinical Impression Statement  Patient performs outcome measures and improves with her TUG score. A new goal for safe use of loftstrand crutches with transfers and ambulation was made.   A HEP was reviewed and progressed for strengthening of LE's.  Patient will benefit from skilled PT to improve strength and safety with assistive devices.     Rehab Potential  Fair    PT Frequency  2x / week    PT Duration  12 weeks    PT Treatment/Interventions  Balance training;Neuromuscular re-education;Gait training;DME Instruction;Aquatic Therapy;Cryotherapy;Electrical Stimulation;Ultrasound;Moist Heat;Stair training;Therapeutic activities;Therapeutic exercise;Patient/family education;Manual techniques;Energy conservation    PT Next Visit Plan  Repeat outcome measures, update goals, recert; Continue strengthening program adding in incorporation of functional strength (and balance activities) as able    PT Home Exercise Plan  Seated clams with YTB 2 x 15 BID, hooklying bridges 2 x 15 BID, Hooklying marches 2 x 15 BID;    Consulted and Agree with Plan of Care  Patient       Patient will benefit from skilled therapeutic intervention in order to improve the following deficits and impairments:  Abnormal gait, Pain, Decreased balance, Decreased strength, Decreased activity tolerance, Difficulty walking  Visit Diagnosis: Muscle weakness (generalized)  Balance disorder  Pain in right wrist  Stiffness of right wrist, not elsewhere classified  Stiffness of right hand, not elsewhere classified  Falls frequently     Problem List Patient Active Problem List   Diagnosis Date Noted  .  Microscopic  hematuria 10/18/2015  . Advance directive discussed with patient 10/13/2014  . Neuropathy ([place]) 08/01/2012  . Hyperthyroidism   . Routine general medical examination at a health care facility 05/09/2011  . Episodic mood disorder ([place]) 12/19/2006  . Essential hypertension, benign 12/19/2006  . RBBB 12/19/2006  . ALLERGIC RHINITIS 12/19/2006  . GERD 12/19/2006  . DIVERTICULOSIS, COLON 12/19/2006    Alanson Puls, PT DPT 12/25/2017, 12:21 PM  [place] MAIN [facility] SERVICES [address] [place], [place], [address] Phone: [phone]   Fax:  [phone]  Name: ALMENA HOKENSON MRN: [id number] Date of Birth: 06/01/1946

## 2017-12-27 ENCOUNTER — Encounter: Payer: Self-pay | Admitting: Physical Therapy

## 2017-12-27 ENCOUNTER — Ambulatory Visit: Payer: Medicare Other | Admitting: Physical Therapy

## 2017-12-27 DIAGNOSIS — M6281 Muscle weakness (generalized): Secondary | ICD-10-CM

## 2017-12-27 DIAGNOSIS — R296 Repeated falls: Secondary | ICD-10-CM

## 2017-12-27 DIAGNOSIS — R2689 Other abnormalities of gait and mobility: Secondary | ICD-10-CM

## 2017-12-27 NOTE — Patient Instructions (Addendum)
SUPINE: Bridging    Place feet on surface. Tighten glutes. Raise hips up. _10__ reps per set, _2__ sets per day, 5_ days per week   Copyright  VHI. All rights reserved.  Flexion - Supine    Tighten buttocks and raise straight uninvolved leg 2 inches from floor _10__ times. Repeat with involved leg. Left leg is stronger than right leg Do _2__ times per day.  Copyright  VHI. All rights reserved.  Hip Flexion - Supine    Lying on back, knees bent, feet on bed, bend hips, bringing knees toward trunk, marching one leg at a time 50 times , 5 days/ week   Copyright  VHI. All rights reserved.   Repeat 50___ times. Do __2_ times per day. 5 x / weekClam Shell 90 Degrees: Leg Lift    Lying with hips and knees bent 90, no pillows, Rotate knee upward then lift leg. . Be sure pelvis does not roll backward. Do not arch back. Do 10___ times, each leg, _2__ times per day.  http://ss.exer.us/73   Copyright  VHI. All rights reserved.  HIP: Abduction - Supine    Move leg away from body. Keep knee and toes straight. _10__ reps per set, _2__ sets per day, __5_ days per week   Copyright  VHI. All rights reserved.    Copyright  VHI. All rights reserved.  Knee Extension (Sitting)  Knee Extension (Sitting)    Place yellow theraband around both ankles  and straighten knee fully, lower slowly. Repeat _20___ times per set. Do __2__ sets per session. Do _2___ sessions 5 x / weekABDUCTION: Sitting - Exercise Ball: Resistance Band (Active)    Sit with feet flat on a chair ( not ball) . Keep feet together draw knees apart, against yellow resistance band, d Complete __2_ sets of _20__ repetitions. Perform _5__ sessions per weekHIP / KNEE: Flexion - Sitting    Raise knee to chest. Keep back straight, knee bent. _20__ reps per set, __2_ sets per day, 5___ days per week  Copyright  VHI. All rights reserved.    Copyright  VHI. All rights reserved.    http://orth.exer.us/733     Copyright  VHI. All rights reserved.  .  http://orth.exer.us/733   Copyright  VHI. All rights reserved.

## 2017-12-27 NOTE — Therapy (Signed)
[place] MAIN [facility] SERVICES [address] [place], [place], [address] Phone: [phone]   Fax:  [phone]  Physical Therapy Treatment  Patient Details  Name: Alexandria Sherman MRN: [id number] Date of Birth: [birth year] Referring Provider: Dr. Mack Guise   Encounter Date: 12/27/2017  PT End of Session - 12/27/17 1124    Visit Number  21    Number of Visits  25    Date for PT Re-Evaluation  02/14/18    PT Start Time  1115    PT Stop Time  1200    PT Time Calculation (min)  45 min    Equipment Utilized During Treatment  Gait belt    Activity Tolerance  Patient limited by fatigue    Behavior During Therapy  [facility] for tasks assessed/performed;Anxious       Past Medical History:  Diagnosis Date  . Anxiety disorder, unspecified   . Depression 1978  . Diverticulosis of colon 04/2005  . GERD (gastroesophageal reflux disease)   . Hypertension   . Hyperthyroidism    Dr Gabriel Carina  . Impaired fasting glucose   . Low TSH level    chronic low, but euthyroid  . Microscopic hematuria   . RBBB   . Sleep disturbance   . Subclinical hyperthyroidism    unspecified  . Tibia fracture 05/2010   left    Past Surgical History:  Procedure Laterality Date  . ABDOMINAL HYSTERECTOMY  1988   hysterectomy/ BSO for endometriosis  . BREAST BIOPSY Right 1992   neg  . BREAST EXCISIONAL BIOPSY Right 1988   benign per pt  . KNEE ARTHROSCOPY  6/12   left--Dr Alvan Dame    There were no vitals filed for this visit.  Subjective Assessment - 12/27/17 1118    Subjective  Pt states that she is doing well on this date. She wants to review her HEP. No recent falls. No questions or concerns.     Pertinent History  d/t     Limitations  House hold activities;Standing;Walking;Lifting    Patient Stated Goals  Would like to build her leg strength and stop falling.    Currently in Pain?  No/denies    Pain Score  0-No pain    Pain Onset  More than a month ago        Treatment: Nu-step x 5 mins ( warm up) Supine marching in hooklying x 50 BLE sidelying clams x 10 x 2 BLE Supine hip abd/add x 10 x 2 BLE SLR x 10 BLE Seated hip abd/ER with YTB x 20 x 2 Seated hip flex x 10 x 2 BLE Sit to stand from various heights x 15 x 2  LAQ with 3 second hold x 15 BLE  Patient needs cues for correct form and technique.  CGA and Min to mod verbal cues used throughout. Continues to have balance deficits typical with diagnosis. Patient performs intermediate level exercises without pain behaviors                         PT Education - 12/27/17 1123    Education provided  Yes    Education Details  Review HEP    Person(s) Educated  Patient    Methods  Explanation;Verbal cues    Comprehension  Verbalized understanding;Returned demonstration          PT Long Term Goals - 12/25/17 1136      PT LONG TERM GOAL #1   Title  Patient will demonstrate a Berg Balance Score of at least 30/56 to demonstrate improved balance and decreased falls risk.    Baseline  20/56:11/27/17 20/56:  20/ 56     Time  12    Period  Weeks    Status  On-going    Target Date  01/24/18      PT LONG TERM GOAL #2   Title  Patient will transfer sit to stand from a chair with no added pillows and use of UEs to demonstrate improved LE strength and reduced falls risk.     Baseline  Requires 2 pillows underneath and heavy use of LUE to complete transfer , continues to use the pillows    Time  12    Period  Weeks    Status  On-going    Target Date  01/24/18      PT LONG TERM GOAL #3   Title  Patient will demonstrate a TUG of less than 20 seconds with no pillows in her chair and use of UEs and AD to demonstrate reduced falls risk.     Baseline  >30" with 2 pillows underneath her in chair.  25.48 sec with 2 pillows under her chair ;20.09 one pillow    Time  12    Period  Weeks    Status  On-going    Target Date  01/24/18      PT LONG TERM GOAL #4   Title  Patient  will report no falls x 1 week at home to demonstrate improved safety with home mobility.     Baseline  Patient reports multiple falls every week. no falls this week ; Patient had one fall in the last month    Time  12    Period  Weeks    Status  Partially Met    Target Date  01/24/18      PT LONG TERM GOAL #5   Title  Patient will be modified independent with loftstrand crutches for outdoor ambulation and for sit to stand transfers.     Time  4    Period  Weeks    Status  New    Target Date  01/24/18            Plan - 12/27/17 1125    Clinical Impression Statement  Pt requires direction and verbal cues for correct performance of  strengthening exercises. . Patient has fatigue with endurance and difficulty with resisted exercises.    Patient struggles with speed during movement as well as balance with unstable surfaces during transfers and gait. Pt encouraged to continue HEP and was given written handout. Patient will benefit from continued skilled PT to improve mobility and safety.    Rehab Potential  Fair    PT Frequency  2x / week    PT Duration  12 weeks    PT Treatment/Interventions  Balance training;Neuromuscular re-education;Gait training;DME Instruction;Aquatic Therapy;Cryotherapy;Electrical Stimulation;Ultrasound;Moist Heat;Stair training;Therapeutic activities;Therapeutic exercise;Patient/family education;Manual techniques;Energy conservation    PT Next Visit Plan  Repeat outcome measures, update goals, recert; Continue strengthening program adding in incorporation of functional strength (and balance activities) as able    PT Home Exercise Plan  Seated clams with YTB 2 x 15 BID, hooklying bridges 2 x 15 BID, Hooklying marches 2 x 15 BID;    Consulted and Agree with Plan of Care  Patient       Patient will benefit from skilled therapeutic intervention in order to improve the following deficits and impairments:  Abnormal  gait, Pain, Decreased balance, Decreased strength,  Decreased activity tolerance, Difficulty walking  Visit Diagnosis: Muscle weakness (generalized)  Balance disorder  Falls frequently     Problem List Patient Active Problem List   Diagnosis Date Noted  . Microscopic hematuria 10/18/2015  . Advance directive discussed with patient 10/13/2014  . Neuropathy ([place]) 08/01/2012  . Hyperthyroidism   . Routine general medical examination at a health care facility 05/09/2011  . Episodic mood disorder ([place]) 12/19/2006  . Essential hypertension, benign 12/19/2006  . RBBB 12/19/2006  . ALLERGIC RHINITIS 12/19/2006  . GERD 12/19/2006  . DIVERTICULOSIS, COLON 12/19/2006    Alanson Puls, PT DPT 12/27/2017, 1:52 PM  [facility] MAIN [facility] SERVICES [address] [place], [place], [address] Phone: [phone]   Fax:  [phone]  Name: YANISA GOODGAME MRN: [id number] Date of Birth: November 12, 1945

## 2018-01-01 ENCOUNTER — Encounter: Payer: Self-pay | Admitting: Physical Therapy

## 2018-01-01 ENCOUNTER — Ambulatory Visit: Payer: Medicare Other | Admitting: Physical Therapy

## 2018-01-01 DIAGNOSIS — R296 Repeated falls: Secondary | ICD-10-CM

## 2018-01-01 DIAGNOSIS — M6281 Muscle weakness (generalized): Secondary | ICD-10-CM | POA: Diagnosis not present

## 2018-01-01 DIAGNOSIS — R2689 Other abnormalities of gait and mobility: Secondary | ICD-10-CM

## 2018-01-03 ENCOUNTER — Encounter: Payer: Medicare Other | Admitting: Physical Therapy

## 2018-01-03 NOTE — Therapy (Signed)
[place] MAIN [facility] SERVICES [address] [place], [place], [address] Phone: [phone]   Fax:  [phone]  Physical Therapy Treatment  Patient Details  Name: Alexandria Sherman MRN: [id number] Date of Birth: Dec 23, 1945 Referring Provider: Dr. Mack Guise   Encounter Date: 01/01/2018  PT End of Session - 01/03/18 1804    Visit Number  22    Number of Visits  25    Date for PT Re-Evaluation  02/14/18    PT Start Time  1115    PT Stop Time  1200    PT Time Calculation (min)  45 min    Equipment Utilized During Treatment  Gait belt    Activity Tolerance  Patient tolerated treatment well;Patient limited by fatigue    Behavior During Therapy  [facility] for tasks assessed/performed       Past Medical History:  Diagnosis Date  . Anxiety disorder, unspecified   . Depression 1978  . Diverticulosis of colon 04/2005  . GERD (gastroesophageal reflux disease)   . Hypertension   . Hyperthyroidism    Dr Gabriel Carina  . Impaired fasting glucose   . Low TSH level    chronic low, but euthyroid  . Microscopic hematuria   . RBBB   . Sleep disturbance   . Subclinical hyperthyroidism    unspecified  . Tibia fracture 05/2010   left    Past Surgical History:  Procedure Laterality Date  . ABDOMINAL HYSTERECTOMY  1988   hysterectomy/ BSO for endometriosis  . BREAST BIOPSY Right 1992   neg  . BREAST EXCISIONAL BIOPSY Right 1988   benign per pt  . KNEE ARTHROSCOPY  6/12   left--Dr Alvan Dame    There were no vitals filed for this visit.    Treatment: Nu-step x 5 mins ( warm up) Supine marching in hooklying x 50 BLE sidelying clams x 10 x 2 BLE Supine hip abd/add x 10 x 2 BLE SLR x 10 BLE Seated hip abd/ER with YTB x 20 x 2 Seated hip flex x 10 x 2 BLE Sit to stand from various heights x 15 x 2 LAQ with 3 second hold x 15 BLE Sit to stand from various surface heights and cues for safety Gait training with loftstrand crutches 100 feet with SBA  Patient  needs cues for correct form and technique. CGA and Min to mod verbal cues used throughout. Continues to have balance deficits typical with diagnosis. Patient performs intermediate level exercises without pain behaviors                              PT Long Term Goals - 01/03/18 1807      PT LONG TERM GOAL #1   Title  Patient will demonstrate a Berg Balance Score of at least 30/56 to demonstrate improved balance and decreased falls risk.    Baseline  20/56:11/27/17 20/56:  20/ 56     Time  12    Period  Weeks    Status  On-going      PT LONG TERM GOAL #2   Title  Patient will transfer sit to stand from a chair with no added pillows and use of UEs to demonstrate improved LE strength and reduced falls risk.     Baseline  Requires 2 pillows underneath and heavy use of LUE to complete transfer , continues to use the pillows    Time  12    Period  Weeks    Status  On-going      PT LONG TERM GOAL #3   Title  Patient will demonstrate a TUG of less than 20 seconds with no pillows in her chair and use of UEs and AD to demonstrate reduced falls risk.     Baseline  >30" with 2 pillows underneath her in chair.  25.48 sec with 2 pillows under her chair ;20.09 one pillow    Time  12    Period  Weeks    Status  On-going      PT LONG TERM GOAL #4   Title  Patient will report no falls x 1 week at home to demonstrate improved safety with home mobility.     Baseline  Patient reports multiple falls every week. no falls this week ; Patient had one fall in the last month    Time  12    Period  Weeks    Status  Partially Met      PT LONG TERM GOAL #5   Title  Patient will be modified independent with loftstrand crutches for outdoor ambulation and for sit to stand transfers.     Time  4    Period  Weeks    Status  Achieved    Target Date  01/01/18            Plan - 01/03/18 1805    Clinical Impression Statement  Patient presents with decreased BLE strength, decreased  gait speed, decreased standing balance.  Patient's main complaint is pain and inability to participate in desired activities.Patient has made progress towards her goals and is ready to be discharged.  Patient will be DC today to continue with HEP.     Rehab Potential  Fair    PT Frequency  2x / week    PT Duration  12 weeks    PT Treatment/Interventions  Balance training;Neuromuscular re-education;Gait training;DME Instruction;Aquatic Therapy;Cryotherapy;Electrical Stimulation;Ultrasound;Moist Heat;Stair training;Therapeutic activities;Therapeutic exercise;Patient/family education;Manual techniques;Energy conservation    PT Next Visit Plan  Repeat outcome measures, update goals, recert; Continue strengthening program adding in incorporation of functional strength (and balance activities) as able    PT Home Exercise Plan  Seated clams with YTB 2 x 15 BID, hooklying bridges 2 x 15 BID, Hooklying marches 2 x 15 BID;    Consulted and Agree with Plan of Care  Patient       Patient will benefit from skilled therapeutic intervention in order to improve the following deficits and impairments:  Abnormal gait, Pain, Decreased balance, Decreased strength, Decreased activity tolerance, Difficulty walking  Visit Diagnosis: Muscle weakness (generalized)  Balance disorder  Falls frequently     Problem List Patient Active Problem List   Diagnosis Date Noted  . Microscopic hematuria 10/18/2015  . Advance directive discussed with patient 10/13/2014  . Neuropathy ([place]) 08/01/2012  . Hyperthyroidism   . Routine general medical examination at a health care facility 05/09/2011  . Episodic mood disorder ([place]) 12/19/2006  . Essential hypertension, benign 12/19/2006  . RBBB 12/19/2006  . ALLERGIC RHINITIS 12/19/2006  . GERD 12/19/2006  . DIVERTICULOSIS, COLON 12/19/2006    Alanson Puls, PT DPT 01/03/2018, 6:11 PM  [place] MAIN [facility] SERVICES [address] [place], [place], [address] Phone: [phone]   Fax:  [phone]  Name: Alexandria Sherman MRN: [id number] Date of Birth: 05/01/1946

## 2018-01-08 ENCOUNTER — Ambulatory Visit: Payer: Medicare Other | Admitting: Physical Therapy

## 2018-01-21 ENCOUNTER — Encounter: Payer: Self-pay | Admitting: Internal Medicine

## 2018-01-21 ENCOUNTER — Ambulatory Visit: Payer: Medicare Other | Admitting: Internal Medicine

## 2018-01-21 VITALS — BP 122/70 | HR 109 | Temp 98.1°F | Ht 63.0 in | Wt 148.0 lb

## 2018-01-21 DIAGNOSIS — F39 Unspecified mood [affective] disorder: Secondary | ICD-10-CM | POA: Diagnosis not present

## 2018-01-21 DIAGNOSIS — E441 Mild protein-calorie malnutrition: Secondary | ICD-10-CM | POA: Insufficient documentation

## 2018-01-21 DIAGNOSIS — I10 Essential (primary) hypertension: Secondary | ICD-10-CM | POA: Diagnosis not present

## 2018-01-21 DIAGNOSIS — G629 Polyneuropathy, unspecified: Secondary | ICD-10-CM | POA: Diagnosis not present

## 2018-01-21 DIAGNOSIS — M6284 Sarcopenia: Secondary | ICD-10-CM | POA: Diagnosis not present

## 2018-01-21 LAB — CBC
HEMATOCRIT: 39.9 % (ref 36.0–46.0)
HEMOGLOBIN: 13.2 g/dL (ref 12.0–15.0)
MCHC: 33.1 g/dL (ref 30.0–36.0)
MCV: 96.2 fl (ref 78.0–100.0)
Platelets: 297 10*3/uL (ref 150.0–400.0)
RBC: 4.14 Mil/uL (ref 3.87–5.11)
RDW: 13.3 % (ref 11.5–15.5)
WBC: 6.1 10*3/uL (ref 4.0–10.5)

## 2018-01-21 LAB — HEPATIC FUNCTION PANEL
ALT: 14 U/L (ref 0–35)
AST: 18 U/L (ref 0–37)
Albumin: 4 g/dL (ref 3.5–5.2)
Alkaline Phosphatase: 68 U/L (ref 39–117)
BILIRUBIN DIRECT: 0.1 mg/dL (ref 0.0–0.3)
BILIRUBIN TOTAL: 0.3 mg/dL (ref 0.2–1.2)
Total Protein: 7.3 g/dL (ref 6.0–8.3)

## 2018-01-21 NOTE — Assessment & Plan Note (Signed)
She self diagnosed this---discussed, not a disease, just reduced muscle strength and mass Discussed adequate protein intake and resistance training

## 2018-01-21 NOTE — Assessment & Plan Note (Signed)
Some pain and sensory changes Neurology evaluation didn't come up with etiology

## 2018-01-21 NOTE — Assessment & Plan Note (Signed)
BP Readings from Last 3 Encounters:  01/21/18 122/70  12/20/17 (!) 158/77  12/18/17 122/78

## 2018-01-21 NOTE — Progress Notes (Signed)
Subjective:    Patient ID: Alexandria Sherman, female    DOB: [birth year], 72 y.o.   MRN: [id number]  HPI Here for follow up of mood issues and after rehab  Still feels weak all over Crutches when out [employer], rollator down This is all the same Dr Malvin Johns never came up with diagnosis  Done with therapy---had PT and OT, but "we didn't get very far" Still trying home program--but only on bed Can't do any weight training--- "I don't have the strength for that" Long term care insurance pays for home health aide that does instrumental ADLs (for now). Friend does grocery shop  Continues on the venlafaxine and depakote "I feel encouraged"---feels her mood is better Anxiety seems better Still gets "scared when I look down the road"  Current Outpatient Medications on File Prior to Visit  Medication Sig Dispense Refill  . aspirin EC 81 MG tablet Take 81 mg daily by mouth.    . calcium carbonate (OSCAL) 1500 (600 Ca) MG TABS tablet Take 600 mg of elemental calcium 2 (two) times daily by mouth.    . cholecalciferol (VITAMIN D) 1000 UNITS tablet Take 2,000 Units by mouth daily.     . clorazepate (TRANXENE) 3.75 MG tablet Take 1 tablet (3.75 mg total) by mouth 3 (three) times daily as needed for anxiety. 180 tablet 0  . divalproex (DEPAKOTE) 125 MG DR tablet Take 1 tablet (125 mg total) by mouth 2 (two) times daily. 180 tablet 3  . glycerin adult 2 g suppository Place 1 suppository as needed rectally for constipation.    Marland Kitchen ibuprofen (ADVIL,MOTRIN) 200 MG tablet Take 200 mg by mouth every 6 (six) hours as needed.    . latanoprost (XALATAN) 0.005 % ophthalmic solution Place 1 drop at bedtime into both eyes.     . meclizine (ANTIVERT) 25 MG tablet Take 25 mg 3 (three) times daily as needed by mouth for dizziness.    . mometasone (ELOCON) 0.1 % cream Apply 1 application topically 2 (two) times daily as needed (itching). 45 g 1  . olopatadine (PATANOL) 0.1 % ophthalmic solution Place 1 drop 2  (two) times daily as needed into both eyes.     Marland Kitchen omeprazole (PRILOSEC) 20 MG capsule Take 20 mg daily by mouth.    . timolol (TIMOPTIC) 0.5 % ophthalmic solution Place 1 drop daily into both eyes.    Marland Kitchen triamterene-hydrochlorothiazide (MAXZIDE-25) 37.5-25 MG tablet Take 1 tablet daily as needed by mouth.    . venlafaxine (EFFEXOR) 75 MG tablet Take 1 tablet (75 mg total) by mouth 2 (two) times daily. 180 tablet 3   No current facility-administered medications on file prior to visit.     Allergies  Allergen Reactions  . Cephalexin     REACTION: vaginal blisters Tolerates penicillins  . Codeine Phosphate     REACTION: vomiting  . Promethazine Hcl     REACTION: nerves    Past Medical History:  Diagnosis Date  . Anxiety disorder, unspecified   . Depression 1978  . Diverticulosis of colon 04/2005  . GERD (gastroesophageal reflux disease)   . Hypertension   . Hyperthyroidism    Dr Tedd Sias  . Impaired fasting glucose   . Low TSH level    chronic low, but euthyroid  . Microscopic hematuria   . RBBB   . Sleep disturbance   . Subclinical hyperthyroidism    unspecified  . Tibia fracture 05/2010   left    Past Surgical History:  Procedure Laterality Date  . ABDOMINAL HYSTERECTOMY  1988   hysterectomy/ BSO for endometriosis  . BREAST BIOPSY Right 1992   neg  . BREAST EXCISIONAL BIOPSY Right 1988   benign per pt  . KNEE ARTHROSCOPY  6/12   left--Dr Charlann Boxer    Family History  Problem Relation Age of Onset  . Cancer Mother        lung  . Diabetes Mother   . Depression Mother   . Cancer Sister        oral cancer  . Depression Sister   . Depression Sister   . Stroke Other   . Heart attack Other   . Cancer Other   . Dementia Other   . Breast cancer Neg Hx   . Thyroid disease Neg Hx     Social History   Socioeconomic History  . Marital status: Divorced    Spouse name: Not on file  . Number of children: 0  . Years of education: 16  . Highest education level:  Bachelor's degree (e.g., BA, AB, BS)  Occupational History  . Occupation: Teacher--retired  Social Needs  . Financial resource strain: Not on file  . Food insecurity:    Worry: Not on file    Inability: Not on file  . Transportation needs:    Medical: Not on file    Non-medical: Not on file  Tobacco Use  . Smoking status: Never Smoker  . Smokeless tobacco: Never Used  Substance and Sexual Activity  . Alcohol use: Yes    Comment: Occasional  . Drug use: Not on file  . Sexual activity: Not on file  Lifestyle  . Physical activity:    Days per week: Not on file    Minutes per session: Not on file  . Stress: Not on file  Relationships  . Social connections:    Talks on phone: Not on file    Gets together: Not on file    Attends religious service: Not on file    Active member of club or organization: Not on file    Attends meetings of clubs or organizations: Not on file    Relationship status: Not on file  . Intimate partner violence:    Fear of current or ex partner: Not on file    Emotionally abused: Not on file    Physically abused: Not on file    Forced sexual activity: Not on file  Other Topics Concern  . Not on file  Social History Narrative   No living will   No health care POA but requests both sisters to do this.    Now has DNR   No tube feeds if cognitively unaware   Review of Systems Appetite seems okay Has lost a little bit of weight Urinary incontinence is worsening--uses pads (asks about detrol but I don't recommend it). Discussed timed voiding Sleeps okay most of the time Feet are puffy    Objective:   Physical Exam  Constitutional: She appears well-developed. No distress.  Neck: No thyromegaly present.  Cardiovascular: Normal rate, regular rhythm and normal heart sounds. Exam reveals no gallop.  No murmur heard. Pulmonary/Chest: Effort normal and breath sounds normal. No respiratory distress. She has no wheezes. She has no rales.  Musculoskeletal:    Feet,calves puffy but no pitting Stasis changes bilaterally  Lymphadenopathy:    She has no cervical adenopathy.  Psychiatric: She has a normal mood and affect. Her behavior is normal.  Assessment & Plan:

## 2018-01-21 NOTE — Assessment & Plan Note (Signed)
Feels better with the depakote Will check labs On venlafaxine also

## 2018-01-22 LAB — VALPROIC ACID LEVEL: Valproic Acid Lvl: 31.6 mg/L — ABNORMAL LOW (ref 50.0–100.0)

## 2018-03-19 ENCOUNTER — Encounter: Payer: Self-pay | Admitting: Internal Medicine

## 2018-03-19 MED ORDER — OMEPRAZOLE 20 MG PO CPDR: 20.0000 mg | DELAYED_RELEASE_CAPSULE | Freq: Every day | ORAL | 3 refills | Status: DC

## 2018-03-19 MED ORDER — CLORAZEPATE DIPOTASSIUM 3.75 MG PO TABS: 3.7500 mg | ORAL_TABLET | Freq: Three times a day (TID) | ORAL | 0 refills | Status: DC | PRN

## 2018-03-19 NOTE — Addendum Note (Signed)
Addended by: Eual FinesBRIDGES, Zayan Delvecchio P on: 03/19/2018 11:41 AM   Modules accepted: Orders

## 2018-03-19 NOTE — Addendum Note (Signed)
Addended by: Tillman AbideLETVAK, Tria Noguera I on: 03/19/2018 12:50 PM   Modules accepted: Orders

## 2018-03-25 IMAGING — CR DG WRIST COMPLETE 3+V*R*
3 series · 3 of 3 positions shown · non-contrast
Comparison: None.

CLINICAL DATA: Pain after a fall

EXAM:
RIGHT WRIST - COMPLETE 3+ VIEW

[wrist pa]
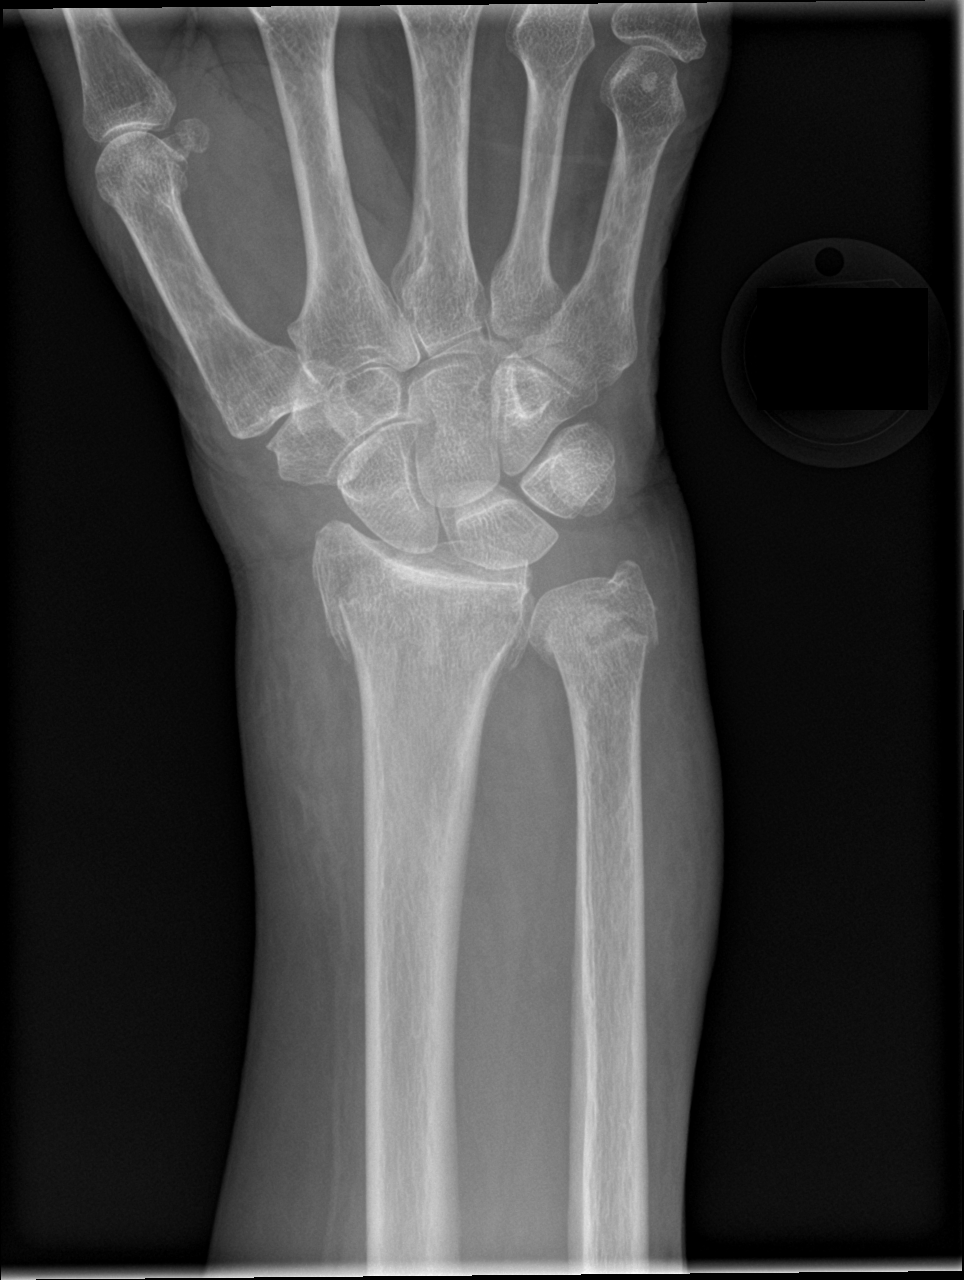

[wrist obl]
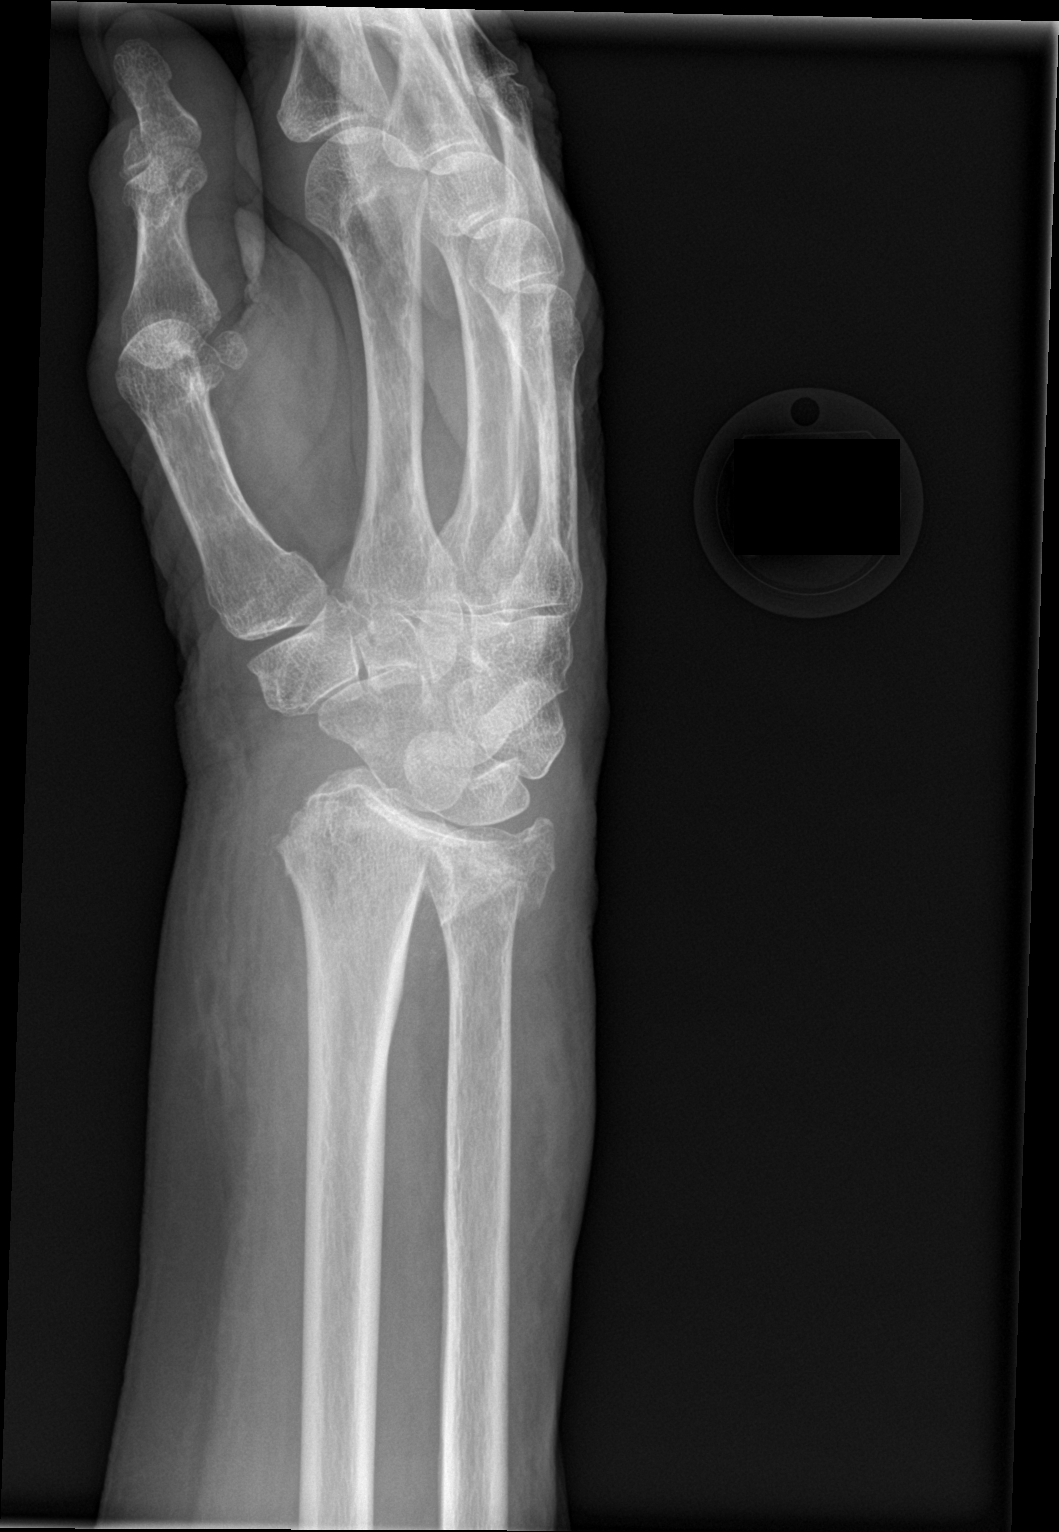

[wrist lat]
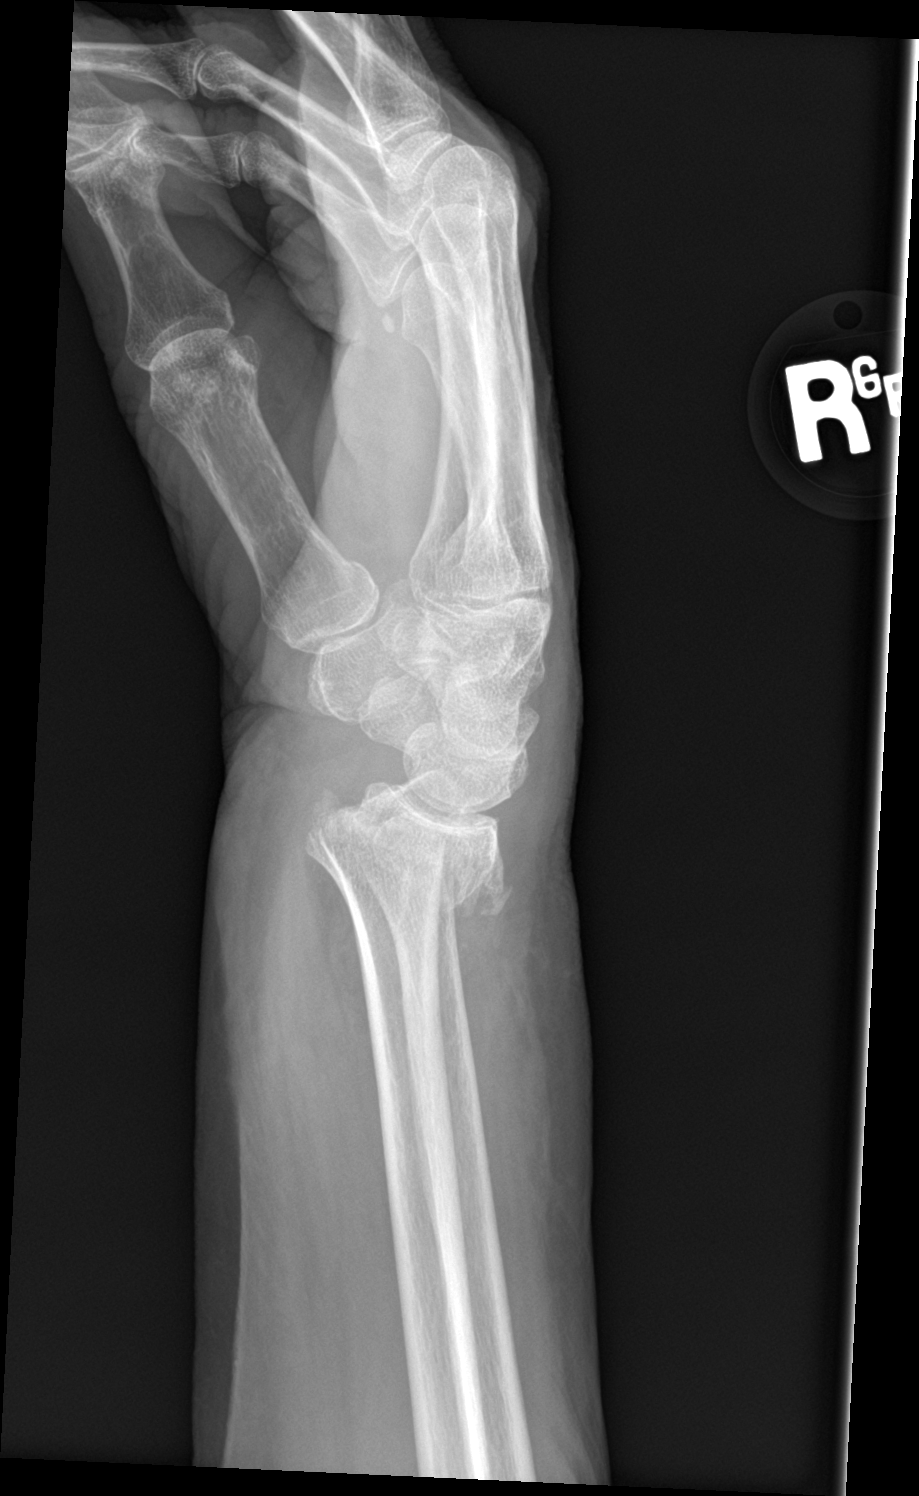

[3 of 3 positions shown; findings below may reference images not displayed]

FINDINGS: Comminuted, impacted intra-articular distal radius fracture with [DATE]
shaft diameter of dorsal displacement of distal fracture fragment.
Comminuted, slightly impacted distal ulna fracture, also with about
[DATE] shaft diameter of dorsal displacement. Associated soft tissue
swelling
IMPRESSION: 1. Comminuted impacted and dorsally displaced intra-articular distal
radius fracture
2. Comminuted impacted dorsally displaced distal ulna fracture

## 2018-07-16 ENCOUNTER — Telehealth: Payer: Self-pay | Admitting: Internal Medicine

## 2018-07-16 NOTE — Telephone Encounter (Signed)
Form done No charge 

## 2018-07-16 NOTE — Telephone Encounter (Signed)
Form placed in Dr Letvak's inbox on his desk 

## 2018-07-16 NOTE — Telephone Encounter (Signed)
Pt dropped off renewal of parking placard form.

## 2018-07-16 NOTE — Telephone Encounter (Signed)
I left a message on patient's voice mail to return my call and a CRM to let patient know form is ready.

## 2018-08-19 ENCOUNTER — Other Ambulatory Visit: Payer: Self-pay | Admitting: Internal Medicine

## 2018-08-19 NOTE — Telephone Encounter (Signed)
Last filled 03-19-18 #180 Last OV 01-21-18 Next OV 11-04-18 Alexandria Sherman

## 2018-09-10 ENCOUNTER — Encounter: Payer: Self-pay | Admitting: Podiatry

## 2018-09-10 ENCOUNTER — Ambulatory Visit: Payer: Medicare Other | Admitting: Podiatry

## 2018-09-10 ENCOUNTER — Ambulatory Visit (INDEPENDENT_AMBULATORY_CARE_PROVIDER_SITE_OTHER): Payer: Medicare Other

## 2018-09-10 VITALS — BP 112/76 | HR 102

## 2018-09-10 DIAGNOSIS — R600 Localized edema: Secondary | ICD-10-CM

## 2018-09-10 DIAGNOSIS — M2042 Other hammer toe(s) (acquired), left foot: Secondary | ICD-10-CM | POA: Diagnosis not present

## 2018-09-10 DIAGNOSIS — I739 Peripheral vascular disease, unspecified: Secondary | ICD-10-CM | POA: Diagnosis not present

## 2018-09-10 DIAGNOSIS — M2041 Other hammer toe(s) (acquired), right foot: Secondary | ICD-10-CM

## 2018-09-20 ENCOUNTER — Telehealth: Payer: Self-pay | Admitting: Podiatry

## 2018-09-20 DIAGNOSIS — R600 Localized edema: Secondary | ICD-10-CM

## 2018-09-20 DIAGNOSIS — I739 Peripheral vascular disease, unspecified: Secondary | ICD-10-CM

## 2018-09-20 NOTE — Telephone Encounter (Signed)
Patient is following up about her Vascular Appointment. She said someone was suppose to call her because her feet are swollen.

## 2018-09-20 NOTE — Telephone Encounter (Signed)
Dr. Logan BoresEvans, did you want to send her for vascular studies?  Your note is not in chart and you did not send me anything stating this.  Please advise

## 2018-09-20 NOTE — Telephone Encounter (Signed)
Yes, please refer to vascular. Dx: b/l LE edema.  Thanks, Dr. Logan BoresEvans

## 2018-09-21 NOTE — Progress Notes (Signed)
   HPI: 72 year old female presents the office today as a new patient regarding bilateral lower extremity swelling.  Patient also says she has hammertoes.  Her main complaint is that her feet swell and they are always red.  She experiences constant burning and itching to the toes.  She says that she does occasionally get blisters on her hammertoes.  Her heels also split and cracked.  She is used cream to help alleviate the cracking heels but it does not stay on.  This lower extremity swelling is been ongoing for the past several years.  It appears she has not done anything for treatment.  She is here for further treatment evaluation  Past Medical History:  Diagnosis Date  . Anxiety disorder, unspecified   . Depression 1978  . Diverticulosis of colon 04/2005  . GERD (gastroesophageal reflux disease)   . Hypertension   . Hyperthyroidism    Dr Tedd SiasSolum  . Impaired fasting glucose   . Low TSH level    chronic low, but euthyroid  . Microscopic hematuria   . RBBB   . Sleep disturbance   . Subclinical hyperthyroidism    unspecified  . Tibia fracture 05/2010   left     Physical Exam: General: The patient is alert and oriented x3 in no acute distress.  Dermatology: Skin is warm, dry and supple bilateral lower extremities. Negative for open lesions or macerations.  Vascular: Extensive amount of bilateral lower extremity edema is noted.  Pulses are nonpalpable because of the edema.  The patient's toes are very cold to touch.  Consistent with PVD bilateral lower extremities.  Neurological: Epicritic and protective threshold grossly intact bilaterally.   Musculoskeletal Exam: Range of motion within normal limits to all pedal and ankle joints bilateral given the patient's age and impinging edema which does make the entire foot and ankle joints less mobile. Muscle strength 5/5 in all groups bilateral.   Radiographic Exam:  Normal osseous mineralization.  Mild joint space narrowing consistent with  some osteoarthritis. No fracture/dislocation/boney destruction.    Assessment: 1.  Peripheral vascular disease bilateral lower extremities 2.  Chronic bilateral lower extremity edema   Plan of Care:  1. Patient evaluated. X-Rays reviewed.  2.  Today I explained the patient is strictly a vascular issue that she is experiencing.  Today we will place a referral to vascular for further treatment and evaluation. 3.  Return to clinic as needed      Felecia ShellingBrent M. , DPM [facility]  Dr. Felecia ShellingBrent M. , DPM    [address]                                        [place], [place] [address]                Office [phone]  Fax [phone]

## 2018-09-23 NOTE — Telephone Encounter (Signed)
Referral has been sent to [facility].

## 2018-09-23 NOTE — Addendum Note (Signed)
Addended by: Geraldine ContrasVENABLE, Tu Shimmel D on: 09/23/2018 09:16 AM   Modules accepted: Orders

## 2018-09-24 NOTE — Addendum Note (Signed)
Addended by: Geraldine ContrasVENABLE, Genevra Orne D on: 09/24/2018 11:33 AM   Modules accepted: Orders

## 2018-09-24 NOTE — Addendum Note (Signed)
Addended by: Geraldine ContrasVENABLE, ANGELA D on: 09/24/2018 10:29 AM   Modules accepted: Orders

## 2018-09-24 NOTE — Telephone Encounter (Signed)
Patient called back and requested to be referred to [facility].  Referral has been placed in chart and [facility] has been notified to cancel upcoming appt.

## 2018-10-09 ENCOUNTER — Encounter (INDEPENDENT_AMBULATORY_CARE_PROVIDER_SITE_OTHER): Payer: Medicare Other

## 2018-10-09 ENCOUNTER — Encounter (INDEPENDENT_AMBULATORY_CARE_PROVIDER_SITE_OTHER): Payer: Medicare Other | Admitting: Vascular Surgery

## 2018-10-16 ENCOUNTER — Encounter (INDEPENDENT_AMBULATORY_CARE_PROVIDER_SITE_OTHER): Payer: Medicare Other | Admitting: Vascular Surgery

## 2018-10-16 ENCOUNTER — Encounter (INDEPENDENT_AMBULATORY_CARE_PROVIDER_SITE_OTHER): Payer: Medicare Other

## 2018-10-28 ENCOUNTER — Encounter: Payer: Medicare Other | Admitting: Internal Medicine

## 2018-11-04 ENCOUNTER — Telehealth: Payer: Self-pay | Admitting: *Deleted

## 2018-11-04 ENCOUNTER — Encounter: Payer: Self-pay | Admitting: Internal Medicine

## 2018-11-04 ENCOUNTER — Ambulatory Visit (INDEPENDENT_AMBULATORY_CARE_PROVIDER_SITE_OTHER): Payer: Medicare Other | Admitting: Internal Medicine

## 2018-11-04 VITALS — BP 130/78 | HR 116 | Temp 98.6°F | Ht 62.5 in | Wt 153.0 lb

## 2018-11-04 DIAGNOSIS — G629 Polyneuropathy, unspecified: Secondary | ICD-10-CM

## 2018-11-04 DIAGNOSIS — I10 Essential (primary) hypertension: Secondary | ICD-10-CM

## 2018-11-04 DIAGNOSIS — Z0000 Encounter for general adult medical examination without abnormal findings: Secondary | ICD-10-CM | POA: Diagnosis not present

## 2018-11-04 DIAGNOSIS — I739 Peripheral vascular disease, unspecified: Secondary | ICD-10-CM

## 2018-11-04 DIAGNOSIS — Z1211 Encounter for screening for malignant neoplasm of colon: Secondary | ICD-10-CM

## 2018-11-04 DIAGNOSIS — F325 Major depressive disorder, single episode, in full remission: Secondary | ICD-10-CM

## 2018-11-04 DIAGNOSIS — F39 Unspecified mood [affective] disorder: Secondary | ICD-10-CM

## 2018-11-04 DIAGNOSIS — Z7189 Other specified counseling: Secondary | ICD-10-CM

## 2018-11-04 LAB — COMPREHENSIVE METABOLIC PANEL
ALT: 13 U/L (ref 0–35)
AST: 21 U/L (ref 0–37)
Albumin: 4.3 g/dL (ref 3.5–5.2)
Alkaline Phosphatase: 55 U/L (ref 39–117)
BUN: 27 mg/dL — ABNORMAL HIGH (ref 6–23)
CALCIUM: 10.8 mg/dL — AB (ref 8.4–10.5)
CO2: 31 meq/L (ref 19–32)
Chloride: 99 mEq/L (ref 96–112)
Creatinine, Ser: 0.64 mg/dL (ref 0.40–1.20)
GFR: 90.7 mL/min (ref >60.00)
Glucose, Bld: 102 mg/dL — ABNORMAL HIGH (ref 70–99)
Potassium: 4.4 mEq/L (ref 3.5–5.1)
Sodium: 140 mEq/L (ref 135–145)
Total Bilirubin: 0.3 mg/dL (ref 0.2–1.2)
Total Protein: 7.7 g/dL (ref 6.0–8.3)

## 2018-11-04 LAB — CBC
HCT: 43.5 % (ref 36.0–46.0)
Hemoglobin: 14.3 g/dL (ref 12.0–15.0)
MCHC: 32.9 g/dL (ref 30.0–36.0)
MCV: 95.2 fl (ref 78.0–100.0)
Platelets: 301 10*3/uL (ref 150.0–400.0)
RBC: 4.57 Mil/uL (ref 3.87–5.11)
RDW: 13.6 % (ref 11.5–15.5)
WBC: 7.2 10*3/uL (ref 4.0–10.5)

## 2018-11-04 LAB — LIPID PANEL
Cholesterol: 179 mg/dL (ref 0–200)
HDL: 77.7 mg/dL (ref >39.00)
LDL Cholesterol: 85 mg/dL (ref 0–99)
NonHDL: 101.37
Total CHOL/HDL Ratio: 2
Triglycerides: 84 mg/dL (ref 0.0–149.0)
VLDL: 16.8 mg/dL (ref 0.0–40.0)

## 2018-11-04 LAB — T4, FREE: Free T4: 1 ng/dL (ref 0.60–1.60)

## 2018-11-04 NOTE — Assessment & Plan Note (Signed)
Ongoing sensory changes Affects balance Also chronic weakness

## 2018-11-04 NOTE — Telephone Encounter (Signed)
-----   Message from Iran Ouch, MD sent at 11/04/2018  4:49 PM EST ----- Will expedite. Thanks.   Misty Stanley, Schedule lower extremity arterial Doppler (segmental) in our office as soon as possible and follow-up with me shortly after.  ----- Message ----- From: Karie Schwalbe, MD Sent: 11/04/2018  12:28 PM EST To: Iran Ouch, MD  Muhammed, She seems to have severe arterial disease. She has an appt with you in March--but this needs to be ASAP. I did order ABIs Rich

## 2018-11-04 NOTE — Assessment & Plan Note (Signed)
I have personally reviewed the Medicare Annual Wellness questionnaire and have noted 1. The patient's medical and social history 2. Their use of alcohol, tobacco or illicit drugs 3. Their current medications and supplements 4. The patient's functional ability including ADL's, fall risks, home safety risks and hearing or visual             impairment. 5. Diet and physical activities 6. Evidence for depression or mood disorders  The patients weight, height, BMI and visual acuity have been recorded in the chart I have made referrals, counseling and provided education to the patient based review of the above and I have provided the pt with a written personalized care plan for preventive services.  I have provided you with a copy of your personalized plan for preventive services. Please take the time to review along with your updated medication list.  Will do FIT Mammogram later this year Td booster if any injury Yearly flu vaccine

## 2018-11-04 NOTE — Progress Notes (Signed)
Subjective:    Patient ID: Alexandria Sherman, female    DOB: [birth year], 73 y.o.   MRN: [id number]  HPI Here for Medicare wellness visit and follow up of chronic health conditions Reviewed form and advanced directives Reviewed other doctors No alcohol or tobacco Tries to do regular light exercise as she can tolerate Frequent falls Ongoing mood issues Vision is not as good--may do better without the glasses Mild hearing issues---doesn't want hearing aide Still has aide 2 hours--3 times a week---does linens, heavier housework, etc She does the grocery shopping at times--but aide back to doing it recently Some mild memory issues---some word finding problems (delayed)  Still worried about her feet They are discolored and has edema Can only wear flip flops Some itching and stinging in past---this is better Saw podiatrist--was supposed to make referral to vascular specialist Tried support hose at times---hard to get on and off  Still with urinary incontinence Mostly at night--- lots of pads due to the extent of it No nocturia or hematuria  Mood is better Still on depakote and venlafaxine Not depressed like before Still gets very anxious---- needs tranxene at night and prn (mostly for doctor visits) Gets "fear and dread" worrying about her health  Still uses crutches when out rollator in house Uses upstairs at home---downstairs to leave and watch TV Has stair glide for the steps Fewer falls this year--more careful. Getting up is tough though  No chest pain No palpitations Some DOE if she exerts a lot--nothing striking No dizziness or syncope  Current Outpatient Medications on File Prior to Visit  Medication Sig Dispense Refill  . aspirin EC 81 MG tablet Take 81 mg daily by mouth.    . calcium carbonate (OSCAL) 1500 (600 Ca) MG TABS tablet Take 600 mg of elemental calcium 2 (two) times daily by mouth.    . cholecalciferol (VITAMIN D) 1000 UNITS tablet Take 2,000 Units by  mouth daily.     . clorazepate (TRANXENE) 3.75 MG tablet Take 1 tablet (3.75 mg total) by mouth 3 (three) times daily as needed for anxiety. 180 tablet 0  . divalproex (DEPAKOTE) 125 MG DR tablet Take 1 tablet (125 mg total) by mouth 2 (two) times daily. 180 tablet 3  . glycerin adult 2 g suppository Place 1 suppository as needed rectally for constipation.    Marland Kitchen ibuprofen (ADVIL,MOTRIN) 200 MG tablet Take 200 mg by mouth every 6 (six) hours as needed.    . latanoprost (XALATAN) 0.005 % ophthalmic solution Place 1 drop at bedtime into both eyes.     . meclizine (ANTIVERT) 25 MG tablet Take 25 mg 3 (three) times daily as needed by mouth for dizziness.    . mometasone (ELOCON) 0.1 % cream Apply 1 application topically 2 (two) times daily as needed (itching). 45 g 1  . olopatadine (PATANOL) 0.1 % ophthalmic solution Place 1 drop 2 (two) times daily as needed into both eyes.     Marland Kitchen omeprazole (PRILOSEC) 20 MG capsule Take 1 capsule (20 mg total) by mouth daily. 90 capsule 3  . timolol (TIMOPTIC) 0.5 % ophthalmic solution Place 1 drop daily into both eyes.    Marland Kitchen triamterene-hydrochlorothiazide (MAXZIDE-25) 37.5-25 MG tablet Take 1 tablet daily as needed by mouth.    . venlafaxine (EFFEXOR) 75 MG tablet Take 1 tablet (75 mg total) by mouth 2 (two) times daily. 180 tablet 3   No current facility-administered medications on file prior to visit.     Allergies  Allergen  Reactions  . Cephalexin     REACTION: vaginal blisters Tolerates penicillins  . Codeine Phosphate     REACTION: vomiting  . Promethazine Hcl     REACTION: nerves    Past Medical History:  Diagnosis Date  . Anxiety disorder, unspecified   . Depression 1978  . Diverticulosis of colon 04/2005  . GERD (gastroesophageal reflux disease)   . Hypertension   . Hyperthyroidism    Dr Tedd SiasSolum  . Impaired fasting glucose   . Low TSH level    chronic low, but euthyroid  . Microscopic hematuria   . RBBB   . Sleep disturbance   .  Subclinical hyperthyroidism    unspecified  . Tibia fracture 05/2010   left    Past Surgical History:  Procedure Laterality Date  . ABDOMINAL HYSTERECTOMY  1988   hysterectomy/ BSO for endometriosis  . BREAST BIOPSY Right 1992   neg  . BREAST EXCISIONAL BIOPSY Right 1988   benign per pt  . KNEE ARTHROSCOPY  6/12   left--Dr Charlann Boxerlin    Family History  Problem Relation Age of Onset  . Cancer Mother        lung  . Diabetes Mother   . Depression Mother   . Cancer Sister        oral cancer  . Depression Sister   . Depression Sister   . Stroke Other   . Heart attack Other   . Cancer Other   . Dementia Other   . Breast cancer Neg Hx   . Thyroid disease Neg Hx     Social History   Socioeconomic History  . Marital status: Divorced    Spouse name: Not on file  . Number of children: 0  . Years of education: 16  . Highest education level: Bachelor's degree (e.g., BA, AB, BS)  Occupational History  . Occupation: Teacher--retired  Social Needs  . Financial resource strain: Not on file  . Food insecurity:    Worry: Not on file    Inability: Not on file  . Transportation needs:    Medical: Not on file    Non-medical: Not on file  Tobacco Use  . Smoking status: Never Smoker  . Smokeless tobacco: Never Used  Substance and Sexual Activity  . Alcohol use: Yes    Comment: Occasional  . Drug use: Not on file  . Sexual activity: Not on file  Lifestyle  . Physical activity:    Days per week: Not on file    Minutes per session: Not on file  . Stress: Not on file  Relationships  . Social connections:    Talks on phone: Not on file    Gets together: Not on file    Attends religious service: Not on file    Active member of club or organization: Not on file    Attends meetings of clubs or organizations: Not on file    Relationship status: Not on file  . Intimate partner violence:    Fear of current or ex partner: Not on file    Emotionally abused: Not on file    Physically  abused: Not on file    Forced sexual activity: Not on file  Other Topics Concern  . Not on file  Social History Narrative   Has living will   Both sisters have health care POA--- forms in chart   Now has DNR   No tube feeds if cognitively unaware   Review of Systems Still has contractures  in right hand and weakness--not ready for surgery Appetite is good Weight is stabilized  Sleeps okay with the tranxene Wears seat belt Teeth are okay---keeps up. Does have chipped crown No rash but has some lesions (keratoses) Bowels are fine. No blood Takes the prilosec daily----rare heartburn (like after BBQ)    Objective:   Physical Exam  Constitutional: She is oriented to person, place, and time. She appears well-developed. No distress.  Neck: No thyromegaly present.  Cardiovascular: Regular rhythm and normal heart sounds. Exam reveals no gallop.  No murmur heard. Rate ~120 (anxious here)  Respiratory: Effort normal and breath sounds normal. No respiratory distress. She has no wheezes. She has no rales.  GI: Soft. There is no abdominal tenderness.  Musculoskeletal:     Comments: Slight edema Feet are purple, cold and no pulses  Lymphadenopathy:    She has no cervical adenopathy.  Neurological: She is alert and oriented to person, place, and time.  President--- "Garnet Koyanagionald Trump, Barack Garry HeaterObama, George Bush" [phone] [id number] Recall 3/3  Still weak--can't get up on table or out of chair without help  Skin:  No foot skin breakdowns  Psychiatric: She has a normal mood and affect. Her behavior is normal.           Assessment & Plan:

## 2018-11-04 NOTE — Assessment & Plan Note (Signed)
BP Readings from Last 3 Encounters:  11/04/18 130/78  09/10/18 112/76  01/21/18 122/70   Good control

## 2018-11-04 NOTE — Assessment & Plan Note (Signed)
Worrisome findings in feet Will get ABI and rush appt with Dr Kirke Corin

## 2018-11-04 NOTE — Assessment & Plan Note (Signed)
Doing well with depakote and venlafaxine

## 2018-11-04 NOTE — Progress Notes (Signed)
Hearing Screening Comments: Pt declined. Aware you has difficulty hearing. Will not get a hearing aid. Vision Screening Comments: September 2019

## 2018-11-04 NOTE — Assessment & Plan Note (Signed)
See social history 

## 2018-11-04 NOTE — Assessment & Plan Note (Signed)
Anxiety and panic at times Uses the tranxene regularly at night--and prn in day

## 2018-11-04 NOTE — Telephone Encounter (Signed)
Spoke with the patient. She has been scheduled for a LEA segmental on 2/13 and a follow up new patient appointment on 2/18 with Dr. Kirke Corin.

## 2018-11-06 ENCOUNTER — Other Ambulatory Visit: Payer: Self-pay | Admitting: Cardiovascular Disease

## 2018-11-06 DIAGNOSIS — I739 Peripheral vascular disease, unspecified: Secondary | ICD-10-CM

## 2018-11-07 ENCOUNTER — Ambulatory Visit (INDEPENDENT_AMBULATORY_CARE_PROVIDER_SITE_OTHER): Payer: Medicare Other

## 2018-11-07 DIAGNOSIS — I739 Peripheral vascular disease, unspecified: Secondary | ICD-10-CM | POA: Diagnosis not present

## 2018-11-08 ENCOUNTER — Other Ambulatory Visit (INDEPENDENT_AMBULATORY_CARE_PROVIDER_SITE_OTHER): Payer: Medicare Other

## 2018-11-08 DIAGNOSIS — Z1211 Encounter for screening for malignant neoplasm of colon: Secondary | ICD-10-CM | POA: Diagnosis not present

## 2018-11-08 LAB — FECAL OCCULT BLOOD, IMMUNOCHEMICAL: Fecal Occult Bld: NEGATIVE

## 2018-11-11 ENCOUNTER — Other Ambulatory Visit: Payer: Self-pay | Admitting: Internal Medicine

## 2018-11-12 ENCOUNTER — Ambulatory Visit: Payer: Medicare Other | Admitting: Cardiovascular Disease

## 2018-11-12 ENCOUNTER — Encounter: Payer: Self-pay | Admitting: Cardiovascular Disease

## 2018-11-12 VITALS — BP 154/98 | Ht 65.0 in | Wt 153.0 lb

## 2018-11-12 DIAGNOSIS — R000 Tachycardia, unspecified: Secondary | ICD-10-CM | POA: Diagnosis not present

## 2018-11-12 DIAGNOSIS — R600 Localized edema: Secondary | ICD-10-CM

## 2018-11-12 DIAGNOSIS — R0602 Shortness of breath: Secondary | ICD-10-CM | POA: Diagnosis not present

## 2018-11-12 NOTE — Progress Notes (Signed)
Cardiology Office Note   Date:  11/12/2018   ID:  Alexandria Sherman, DOB [birth year], MRN [id number]  PCP:  Karie Schwalbe, MD  Cardiologist:   Lorine Bears, MD   Chief Complaint  Patient presents with  . New Patient (Initial Visit)    Referred by Dr Alphonsus Sias for PAD and bilateral lower extremity edema.Medications reviewed verbally with patient.       History of Present Illness: Alexandria Sherman is a 73 y.o. female who was referred by Dr. Alphonsus Sias for evaluation of possible peripheral arterial disease as well as bilateral leg edema. She has known history of anxiety and depression, GERD, hypertension and thyroid problems. She has history of frequent falls over the last 2 years and she did have an injury in 2018 with subsequent bilateral leg weakness of unclear etiology.  She uses a wheeled walker for ambulation and reports no significant improvement with rehab.  Around the same time, she noticed bilateral leg swelling which got worse recently and she started having dark red discoloration over the last 6 months.  There is some stingy itchy feeling in both legs but no muscle cramps or claudication.  No lower extremity ulceration. She also noticed that her feet are cold externally although they do feel warm from the inside.  Due to these findings, there was concern about possible peripheral arterial disease and thus she was referred for testing. She underwent lower extremity arterial Doppler which showed normal ABI and toe pressure bilaterally with triphasic waveforms. There is no history of DVT.  She has chronic sinus tachycardia with exertional dyspnea without chest pain. She is not a smoker and does not have history of diabetes.  Past Medical History:  Diagnosis Date  . Anxiety disorder, unspecified   . Depression 1978  . Diverticulosis of colon 04/2005  . GERD (gastroesophageal reflux disease)   . Hypertension   . Hyperthyroidism    Dr Tedd Sias  . Impaired fasting glucose   . Low TSH  level    chronic low, but euthyroid  . Microscopic hematuria   . RBBB   . Sleep disturbance   . Subclinical hyperthyroidism    unspecified  . Tibia fracture 05/2010   left    Past Surgical History:  Procedure Laterality Date  . ABDOMINAL HYSTERECTOMY  1988   hysterectomy/ BSO for endometriosis  . BREAST BIOPSY Right 1992   neg  . BREAST EXCISIONAL BIOPSY Right 1988   benign per pt  . KNEE ARTHROSCOPY  6/12   left--Dr Charlann Boxer     Current Outpatient Medications  Medication Sig Dispense Refill  . aspirin EC 81 MG tablet Take 81 mg daily by mouth.    . calcium carbonate (OSCAL) 1500 (600 Ca) MG TABS tablet Take 600 mg of elemental calcium 2 (two) times daily by mouth.    . cholecalciferol (VITAMIN D) 1000 UNITS tablet Take 2,000 Units by mouth daily.     . clorazepate (TRANXENE) 3.75 MG tablet Take 1 tablet (3.75 mg total) by mouth 3 (three) times daily as needed for anxiety. 180 tablet 0  . divalproex (DEPAKOTE) 125 MG DR tablet TAKE 1  TABLET BY MOUTH TWICE DAILY 60 tablet 11  . glycerin adult 2 g suppository Place 1 suppository as needed rectally for constipation.    Marland Kitchen ibuprofen (ADVIL,MOTRIN) 200 MG tablet Take 200 mg by mouth every 6 (six) hours as needed.    . latanoprost (XALATAN) 0.005 % ophthalmic solution Place 1 drop at bedtime into both  eyes.     . meclizine (ANTIVERT) 25 MG tablet Take 25 mg 3 (three) times daily as needed by mouth for dizziness.    . mometasone (ELOCON) 0.1 % cream Apply 1 application topically 2 (two) times daily as needed (itching). 45 g 1  . olopatadine (PATANOL) 0.1 % ophthalmic solution Place 1 drop 2 (two) times daily as needed into both eyes.     Marland Kitchen omeprazole (PRILOSEC) 20 MG capsule Take 1 capsule (20 mg total) by mouth daily. 90 capsule 3  . timolol (TIMOPTIC) 0.5 % ophthalmic solution Place 1 drop daily into both eyes.    Marland Kitchen triamterene-hydrochlorothiazide (MAXZIDE-25) 37.5-25 MG tablet Take 1 tablet daily as needed by mouth.    . venlafaxine  (EFFEXOR) 75 MG tablet TAKE 1 TABLET BY MOUTH TWICE DAILY 60 tablet 11   No current facility-administered medications for this visit.     Allergies:   Cephalexin; Codeine phosphate; and Promethazine hcl    Social History:  The patient  reports that she has never smoked. She has never used smokeless tobacco. She reports current alcohol use.   Family History:  The patient's family history includes Cancer in her mother, sister, and another family member; Dementia in an other family member; Depression in her mother, sister, and sister; Diabetes in her mother; Heart attack in an other family member; Stroke in an other family member.    ROS:  Please see the history of present illness.   Otherwise, review of systems are positive for none.   All other systems are reviewed and negative.    PHYSICAL EXAM: VS:  BP (!) 154/98 (BP Location: Right Arm, Patient Position: Sitting, Cuff Size: Normal)   Ht 5\' 5"  (1.651 m)   Wt 153 lb (69.4 kg)   BMI 25.46 kg/m  , BMI Body mass index is 25.46 kg/m. GEN: Well nourished, well developed, in no acute distress  HEENT: normal  Neck: no JVD, carotid bruits, or masses Cardiac: RRR with tachycardia; no murmurs, rubs, or gallops. Respiratory:  clear to auscultation bilaterally, normal work of breathing GI: soft, nontender, nondistended, + BS MS: no deformity or atrophy  Skin: warm and dry, no rash Neuro:  Strength and sensation are intact Psych: euthymic mood, full affect Vascular: Dorsalis pedis is palpable bilaterally.  There is mild to moderate bilateral leg edema with significant dark discoloration all the way up to the knees.   EKG:  EKG is ordered today. The ekg ordered today demonstrates sinus tachycardia with right bundle branch block   Recent Labs: 11/04/2018: ALT 13; BUN 27; Creatinine, Ser 0.64; Hemoglobin 14.3; Platelets 301.0; Potassium 4.4; Sodium 140    Lipid Panel    Component Value Date/Time   CHOL 179 11/04/2018 1233   TRIG 84.0  11/04/2018 1233   HDL 77.70 11/04/2018 1233   CHOLHDL 2 11/04/2018 1233   VLDL 16.8 11/04/2018 1233   LDLCALC 85 11/04/2018 1233   LDLDIRECT 140.8 06/15/2009 1045      Wt Readings from Last 3 Encounters:  11/12/18 153 lb (69.4 kg)  11/04/18 153 lb (69.4 kg)  01/21/18 148 lb (67.1 kg)       PAD Screen 11/12/2018  Previous PAD dx? Yes  Previous surgical procedure? No  Feet/toe relief with dangling? Yes  Painful, non-healing ulcers? No  Extremities discolored? Yes      ASSESSMENT AND PLAN:  1.  Chronic venous insufficiency: The patient seems to have significant bilateral leg swelling with chronic stasis dermatitis.  Her feet are  definitely cold but she has palpable pedal pulses bilaterally and also her arterial Doppler was relatively normal.  It does not appear that she has significant peripheral arterial disease but more likely to have a venous etiology. I am going to obtain lower extremity venous Doppler to exclude DVT.  I suspect that her sedentary lifestyle over the last 2 years significantly worsened this condition. I advised her to elevate her legs frequently during the day and use knee-high support stockings once we exclude DVT. A lymphedema pump can be considered if the above does not work.  2.  Dyspnea with sinus tachycardia: I requested an echocardiogram to ensure no underlying congestive heart failure especially with this degree of leg edema.    Disposition:   FU with me in 2 months  Signed,  Lorine BearsMuhammad Arida, MD  11/12/2018 5:16 PM    [facility]

## 2018-11-12 NOTE — Patient Instructions (Signed)
Medication Instructions:  No changes If you need a refill on your cardiac medications before your next appointment, please call your pharmacy.   Lab work: None ordered  Testing/Procedures: Your physician has requested that you have an echocardiogram. Echocardiography is a painless test that uses sound waves to create images of your heart. It provides your doctor with information about the size and shape of your heart and how well your heart's chambers and valves are working. You may receive an ultrasound enhancing agent through an IV if needed to better visualize your heart during the echo.This procedure takes approximately one hour. There are no restrictions for this procedure. This will take place at the [facility] clinic.   Your physician has requested that you have a lower extremity venous duplex. This test is an ultrasound of the veins in the legs. It looks at venous blood flow that carries blood from the heart to the legs. Allow one hour for a Lower Venous exam. Allow thirty minutes for an Upper Venous exam. There are no restrictions or special instructions.   Follow-Up: At [facility], you and your health needs are our priority.  As part of our continuing mission to provide you with exceptional heart care, we have created designated Provider Care Teams.  These Care Teams include your primary Cardiologist (physician) and Advanced Practice Providers (APPs -  Physician Assistants and Nurse Practitioners) who all work together to provide you with the care you need, when you need it. You will need a follow up appointment in 2 months. You may see Dr. Kirke Corin or one of the following Advanced Practice Providers on your designated Care Team:   Nicolasa Ducking, NP Eula Listen, PA-C

## 2018-11-13 ENCOUNTER — Ambulatory Visit (INDEPENDENT_AMBULATORY_CARE_PROVIDER_SITE_OTHER): Payer: Medicare Other

## 2018-11-13 DIAGNOSIS — R600 Localized edema: Secondary | ICD-10-CM | POA: Diagnosis not present

## 2018-12-15 ENCOUNTER — Telehealth: Payer: Self-pay

## 2018-12-16 ENCOUNTER — Other Ambulatory Visit: Payer: Medicare Other

## 2018-12-16 NOTE — Telephone Encounter (Signed)
Patient reports;  1 blister to right calf area (the other blisters have popped) and oozing clear liquid only).  There is no redness, warmth or pain to the areas.   She has noted increased swelling to her leg which is new.    Bilateral feet ongoing swelling with redness (which is not new) ongoing "splits" in her heels that she has had ongoing for years.  The swelling in her bilateral feet has been followed by cardiology but they don't seem to be doing anything for her per her report.   Echocardiogram Mid April with cardiologist.  She is concerned that she may need an antibiotic or a fluid pill for symptoms.  If we send in abx, she states that she cannot tolerate Keflex or augmentin.  Dr. Alphonsus Sias please advise how you would like to proceed with patient.    Cardiology has cancelled her appt with them for this week.

## 2018-12-16 NOTE — Telephone Encounter (Signed)
[facility] Night - Client Nonclinical Telephone Record [facility] Client [facility] Night - Client Client Site [facility] - Night Physician Tillman Abide - MD Contact Type Call Who Is Calling Patient / Member / Family / Caregiver Caller Name Griffyn Clothier Caller Phone Number [phone] Patient Name Alexandria Sherman Patient DOB March 15, 1946 Call Type Message Only Information Provided Reason for Call Request for General Office Information Initial Comment Caller states that she needs Carollee Herter to let Dr. Alphonsus Sias know she has water blisters on the side of her left calf. It is leaking water, then scabs over. The area is not hot to the touch and she doesn't thinks she has a fever. If the doctor thinks it's cellulitis, do not call in Keflex or Augmentin, maybe Amoxicillin. Please call her back tomorrow. She has split heals and discolored heels. Additional Comment Call Closed By: Brooke Pace

## 2018-12-17 MED ORDER — FUROSEMIDE 40 MG PO TABS: ORAL_TABLET | ORAL | 0 refills | Status: DC

## 2018-12-17 NOTE — Telephone Encounter (Signed)
I cannot tell about infection without seeing her legs It is reasonable to try furosemide 40mg  daily prn (#30 x 0). She should try to limit this to 2-3 per week and make sure she is completely avoiding salt in her diet (as much as possible). If the redness gets worse, or fever, etc---will probably need to come in to be checked

## 2018-12-19 ENCOUNTER — Encounter

## 2018-12-19 ENCOUNTER — Ambulatory Visit: Payer: Medicare Other | Admitting: Cardiovascular Disease

## 2018-12-23 ENCOUNTER — Other Ambulatory Visit: Payer: Medicare Other

## 2019-01-02 ENCOUNTER — Telehealth: Payer: Self-pay | Admitting: Cardiovascular Disease

## 2019-01-02 NOTE — Telephone Encounter (Signed)
Patient calling States that she was speaking with someone yesterday and they were checking on something and would get back with her Patient states the best time to call back is early afternoon Please advise

## 2019-01-02 NOTE — Telephone Encounter (Signed)
Pt is returning you call .

## 2019-01-02 NOTE — Telephone Encounter (Signed)
Left a message for the patient to call back.  

## 2019-01-02 NOTE — Telephone Encounter (Signed)
Call placed to the patient. Per Dr. Kirke CorinArida, the patient's echo may be rescheduled when the COVID crisis has ended.  The patient has been set up for a WEB/Doximity visit on 01/23/19 with Dr. Kirke CorinArida.  TELEPHONE CALL NOTE  Alexandria Sherman has been deemed a candidate for a follow-up tele-health visit to limit community exposure during the Covid-19 pandemic. I spoke with the patient via phone to ensure availability of phone/video source, confirm preferred email & phone number, and discuss instructions and expectations.  I reminded Alexandria Sherman to be prepared with any vital sign and/or heart rhythm information that could potentially be obtained via home monitoring, at the time of her visit. I reminded Alexandria Sherman to expect a phone call at the time of her visit if her visit.  Did the patient verbally acknowledge consent to treatment? Yes  Sandi MariscalLisa Y Richardson, RN 01/02/2019 2:26 PM   DOWNLOADING THE WEBEX SOFTWARE TO SMARTPHONE  - If Apple, go to [employer] Store and type in WebEx in the search bar. Download [employer] Meetings, the blue/green circle. The app is free but as with any other app downloads, their phone may require them to verify saved payment information or Apple password. The patient does NOT have to create an account.  - If Android, ask patient to go to [employer] Play Store and type in WebEx in the search bar. Download [employer] Meetings, the blue/green circle. The app is free but as with any other app downloads, their phone may require them to verify saved payment information or Android password. The patient does NOT have to create an account.   CONSENT FOR TELE-HEALTH VISIT - PLEASE REVIEW  I hereby voluntarily request, consent and authorize [facility] and its employed or contracted physicians, physician assistants, nurse practitioners or other licensed health care professionals (the Practitioner), to provide me with telemedicine health care services (the "Services") as deemed necessary  by the treating Practitioner. I acknowledge and consent to receive the Services by the Practitioner via telemedicine. I understand that the telemedicine visit will involve communicating with the Practitioner through live audiovisual communication technology and the disclosure of certain medical information by electronic transmission. I acknowledge that I have been given the opportunity to request an in-person assessment or other available alternative prior to the telemedicine visit and am voluntarily participating in the telemedicine visit.  I understand that I have the right to withhold or withdraw my consent to the use of telemedicine in the course of my care at any time, without affecting my right to future care or treatment, and that the Practitioner or I may terminate the telemedicine visit at any time. I understand that I have the right to inspect all information obtained and/or recorded in the course of the telemedicine visit and may receive copies of available information for a reasonable fee.  I understand that some of the potential risks of receiving the Services via telemedicine include:  Marland Kitchen. Delay or interruption in medical evaluation due to technological equipment failure or disruption; . Information transmitted may not be sufficient (e.g. poor resolution of images) to allow for appropriate medical decision making by the Practitioner; and/or  . In rare instances, security protocols could fail, causing a breach of personal health information.  Furthermore, I acknowledge that it is my responsibility to provide information about my medical history, conditions and care that is complete and accurate to the best of my ability. I acknowledge that Practitioner's advice, recommendations, and/or decision may be based on factors  not within their control, such as incomplete or inaccurate data provided by me or distortions of diagnostic images or specimens that may result from electronic transmissions. I  understand that the practice of medicine is not an exact science and that Practitioner makes no warranties or guarantees regarding treatment outcomes. I acknowledge that I will receive a copy of this consent concurrently upon execution via email to the email address I last provided but may also request a printed copy by calling the office of [facility].    I understand that my insurance will be billed for this visit.   I have read or had this consent read to me. . I understand the contents of this consent, which adequately explains the benefits and risks of the Services being provided via telemedicine.  . I have been provided ample opportunity to ask questions regarding this consent and the Services and have had my questions answered to my satisfaction. . I give my informed consent for the services to be provided through the use of telemedicine in my medical care  By participating in this telemedicine visit I agree to the above.

## 2019-01-06 ENCOUNTER — Other Ambulatory Visit: Payer: Medicare Other

## 2019-01-23 ENCOUNTER — Telehealth (INDEPENDENT_AMBULATORY_CARE_PROVIDER_SITE_OTHER): Payer: Medicare Other | Admitting: Cardiovascular Disease

## 2019-01-23 ENCOUNTER — Encounter: Payer: Self-pay | Admitting: Cardiovascular Disease

## 2019-01-23 ENCOUNTER — Other Ambulatory Visit: Payer: Self-pay

## 2019-01-23 VITALS — BP 126/88 | HR 82 | Ht 65.0 in | Wt 150.0 lb

## 2019-01-23 DIAGNOSIS — I872 Venous insufficiency (chronic) (peripheral): Secondary | ICD-10-CM

## 2019-01-23 DIAGNOSIS — R0602 Shortness of breath: Secondary | ICD-10-CM

## 2019-01-23 NOTE — Progress Notes (Signed)
Virtual Visit via Video Note   This visit type was conducted due to national recommendations for restrictions regarding the COVID-19 Pandemic (e.g. social distancing) in an effort to limit this patient's exposure and mitigate transmission in our community.  Due to her co-morbid illnesses, this patient is at least at moderate risk for complications without adequate follow up.  This format is felt to be most appropriate for this patient at this time.  All issues noted in this document were discussed and addressed.  A limited physical exam was performed with this format.  Please refer to the patient's chart for her consent to telehealth for [facility].   Evaluation Performed:  Follow-up visit  Date:  01/23/2019   ID:  Alexandria Sherman, DOB June 16, 1946, MRN [id number]  Patient Location: Home Provider Location: Office  PCP:  Karie SchwalbeLetvak, Richard I, MD  Cardiologist:  No primary care provider on file.  Electrophysiologist:  None   Chief Complaint: Follow-up visit  History of Present Illness:    Alexandria Sherman is a 73 y.o. female who was seen today via video visit for follow-up regarding leg edema thought to be due to chronic venous insufficiency. She has known history of anxiety and depression, GERD, hypertension and thyroid problems. She has history of frequent falls over the last 2 years and she did have an injury in 2018 with subsequent bilateral leg weakness of unclear etiology.  She uses a wheeled walker for ambulation and reports no significant improvement with rehab. She was seen few months ago for progressive bilateral leg edema with some darker discoloration.  She also noticed that her feet are cold externally although they do feel warm from the inside.  Due to these findings, there was concern about possible peripheral arterial disease and thus she was referred for testing. She underwent lower extremity arterial Doppler which showed normal ABI and toe pressure bilaterally with triphasic  waveforms.  Venous Doppler was negative for DVT.  She has chronic sinus tachycardia .  She is not a smoker and does not have history of diabetes. She did complain of worsening exertional dyspnea and I wanted her to get an echocardiogram but that has not been done yet. She still reports dyspnea with moderate exertion.  She has been elevating her legs with some improvement in leg edema but she has not obtained the support stockings as recommended.  The patient does not have symptoms concerning for COVID-19 infection (fever, chills, cough, or new shortness of breath).    Past Medical History:  Diagnosis Date  . Anxiety disorder, unspecified   . Depression 1978  . Diverticulosis of colon 04/2005  . GERD (gastroesophageal reflux disease)   . Hypertension   . Hyperthyroidism    Dr Tedd SiasSolum  . Impaired fasting glucose   . Low TSH level    chronic low, but euthyroid  . Microscopic hematuria   . RBBB   . Sleep disturbance   . Subclinical hyperthyroidism    unspecified  . Tibia fracture 05/2010   left   Past Surgical History:  Procedure Laterality Date  . ABDOMINAL HYSTERECTOMY  1988   hysterectomy/ BSO for endometriosis  . BREAST BIOPSY Right 1992   neg  . BREAST EXCISIONAL BIOPSY Right 1988   benign per pt  . KNEE ARTHROSCOPY  6/12   left--Dr Charlann Boxerlin     Current Meds  Medication Sig  . aspirin EC 81 MG tablet Take 81 mg daily by mouth.  . calcium carbonate (OSCAL) 1500 (600 Ca)  MG TABS tablet Take 600 mg of elemental calcium 2 (two) times daily by mouth.  . cholecalciferol (VITAMIN D) 1000 UNITS tablet Take 2,000 Units by mouth daily.   . clorazepate (TRANXENE) 3.75 MG tablet Take 1 tablet (3.75 mg total) by mouth 3 (three) times daily as needed for anxiety.  . divalproex (DEPAKOTE) 125 MG DR tablet TAKE 1  TABLET BY MOUTH TWICE DAILY  . furosemide (LASIX) 40 MG tablet 1 tablet daily as needed. No more than 2-3 tablets in a week.  Marland Kitchen glycerin adult 2 g suppository Place 1 suppository  as needed rectally for constipation.  Marland Kitchen ibuprofen (ADVIL,MOTRIN) 200 MG tablet Take 200 mg by mouth every 6 (six) hours as needed.  . latanoprost (XALATAN) 0.005 % ophthalmic solution Place 1 drop at bedtime into both eyes.   . meclizine (ANTIVERT) 25 MG tablet Take 25 mg 3 (three) times daily as needed by mouth for dizziness.  . mometasone (ELOCON) 0.1 % cream Apply 1 application topically 2 (two) times daily as needed (itching).  Marland Kitchen olopatadine (PATANOL) 0.1 % ophthalmic solution Place 1 drop 2 (two) times daily as needed into both eyes.   Marland Kitchen omeprazole (PRILOSEC) 20 MG capsule Take 1 capsule (20 mg total) by mouth daily.  . timolol (TIMOPTIC) 0.5 % ophthalmic solution Place 1 drop daily into both eyes.  Marland Kitchen venlafaxine (EFFEXOR) 75 MG tablet TAKE 1 TABLET BY MOUTH TWICE DAILY  . vitamin B-12 (CYANOCOBALAMIN) 1000 MCG tablet Take 1,000 mcg by mouth daily.     Allergies:   Cephalexin; Codeine phosphate; and Promethazine hcl   Social History   Tobacco Use  . Smoking status: Never Smoker  . Smokeless tobacco: Never Used  Substance Use Topics  . Alcohol use: Yes    Comment: Occasional  . Drug use: Not on file     Family Hx: The patient's family history includes Cancer in her mother, sister, and another family member; Dementia in an other family member; Depression in her mother, sister, and sister; Diabetes in her mother; Heart attack in an other family member; Stroke in an other family member. There is no history of Breast cancer or Thyroid disease.  ROS:   Please see the history of present illness.     All other systems reviewed and are negative.   Prior CV studies:   The following studies were reviewed today:  I reviewed results of arterial and venous Doppler with her  Labs/Other Tests and Data Reviewed:    EKG:  No ECG reviewed.  Recent Labs: 11/04/2018: ALT 13; BUN 27; Creatinine, Ser 0.64; Hemoglobin 14.3; Platelets 301.0; Potassium 4.4; Sodium 140   Recent Lipid Panel  Lab Results  Component Value Date/Time   CHOL 179 11/04/2018 12:33 PM   TRIG 84.0 11/04/2018 12:33 PM   HDL 77.70 11/04/2018 12:33 PM   CHOLHDL 2 11/04/2018 12:33 PM   LDLCALC 85 11/04/2018 12:33 PM   LDLDIRECT 140.8 06/15/2009 10:45 AM    Wt Readings from Last 3 Encounters:  01/23/19 150 lb (68 kg)  11/12/18 153 lb (69.4 kg)  11/04/18 153 lb (69.4 kg)     Objective:    Vital Signs:  BP 126/88   Pulse 82   Ht  (1.651 m)   Wt 150 lb (68 kg)   BMI 24.96 kg/m    VITAL SIGNS:  reviewed GEN:  no acute distress EYES:  sclerae anicteric, EOMI - Extraocular Movements Intact RESPIRATORY:  normal respiratory effort, symmetric expansion CARDIOVASCULAR:  no peripheral  edema SKIN:  no rash, lesions or ulcers. MUSCULOSKELETAL:  no obvious deformities. NEURO:  alert and oriented x 3, no obvious focal deficit PSYCH:  normal affect  ASSESSMENT & PLAN:    1. Chronic venous insufficiency with stasis dermatitis: No evidence of PAD by exam or with Doppler.  Venous Doppler showed no evidence of DVT.  Her symptoms improved with leg elevation but I advised her to obtain the knee-high support stockings to be used during the day.  A lymphedema pump can be considered if the above does not work.  2.  Dyspnea with sinus tachycardia: He continues to have symptoms and I am going to reschedule her echocardiogram in the next few weeks.   COVID-19 Education: The signs and symptoms of COVID-19 were discussed with the patient and how to seek care for testing (follow up with PCP or arrange E-visit).  The importance of social distancing was discussed today.  Time:   Today, I have spent 20 minutes with the patient with telehealth technology discussing the above problems.     Medication Adjustments/Labs and Tests Ordered: Current medicines are reviewed at length with the patient today.  Concerns regarding medicines are outlined above.   Tests Ordered: No orders of the defined types were placed  in this encounter.   Medication Changes: No orders of the defined types were placed in this encounter.   Disposition:  Follow up in 3 month(s)  Signed, Lorine Bears, MD  01/23/2019 10:53 AM    [facility]

## 2019-01-23 NOTE — Patient Instructions (Signed)
Medication Instructions:  No change in medications If you need a refill on your cardiac medications before your next appointment, please call your pharmacy.   Lab work: None If you have labs (blood work) drawn today and your tests are completely normal, you will receive your results only by: Marland Kitchen MyChart Message (if you have MyChart) OR . A paper copy in the mail If you have any lab test that is abnormal or we need to change your treatment, we will call you to review the results.  Testing/Procedures: Schedule an echocardiogram in 2 weeks  Follow-Up: At [facility], you and your health needs are our priority.  As part of our continuing mission to provide you with exceptional heart care, we have created designated Provider Care Teams.  These Care Teams include your primary Cardiologist (physician) and Advanced Practice Providers (APPs -  Physician Assistants and Nurse Practitioners) who all work together to provide you with the care you need, when you need it. You will need a follow up appointment in 3 months.

## 2019-02-12 ENCOUNTER — Other Ambulatory Visit: Payer: Self-pay

## 2019-02-12 MED ORDER — CLORAZEPATE DIPOTASSIUM 3.75 MG PO TABS: 3.7500 mg | ORAL_TABLET | Freq: Three times a day (TID) | ORAL | 0 refills | Status: DC | PRN

## 2019-02-12 NOTE — Telephone Encounter (Signed)
Refill request from [facility] pharmacy for Clorazepate. Last filled on 08/19/2018 for #180 with 0 refills. LOV 11/04/2018 for CPE and future appointment 11/10/2019. Dr. Karle Starch patient

## 2019-02-13 ENCOUNTER — Ambulatory Visit (INDEPENDENT_AMBULATORY_CARE_PROVIDER_SITE_OTHER): Payer: Medicare Other

## 2019-02-13 ENCOUNTER — Other Ambulatory Visit: Payer: Self-pay

## 2019-02-13 DIAGNOSIS — R0602 Shortness of breath: Secondary | ICD-10-CM

## 2019-02-13 DIAGNOSIS — R000 Tachycardia, unspecified: Secondary | ICD-10-CM

## 2019-03-17 ENCOUNTER — Telehealth: Payer: Self-pay

## 2019-03-17 NOTE — Telephone Encounter (Signed)
Call attempted to reschedule 04/08/2019 face to face appointment with Dr. Fletcher Anon to a telehealth visit due to schedule change. Left voicemail message requesting for patient to call back to discuss.

## 2019-04-03 ENCOUNTER — Ambulatory Visit: Payer: Medicare Other | Admitting: Cardiovascular Disease

## 2019-04-08 ENCOUNTER — Encounter: Payer: Self-pay | Admitting: Cardiovascular Disease

## 2019-04-08 ENCOUNTER — Other Ambulatory Visit: Payer: Self-pay

## 2019-04-08 ENCOUNTER — Telehealth (INDEPENDENT_AMBULATORY_CARE_PROVIDER_SITE_OTHER): Payer: Medicare Other | Admitting: Cardiovascular Disease

## 2019-04-08 VITALS — BP 126/88 | HR 82 | Ht 65.0 in | Wt 155.0 lb

## 2019-04-08 DIAGNOSIS — I872 Venous insufficiency (chronic) (peripheral): Secondary | ICD-10-CM | POA: Diagnosis not present

## 2019-04-08 NOTE — Progress Notes (Signed)
Virtual Visit via Phone Note   This visit type was conducted due to national recommendations for restrictions regarding the COVID-19 Pandemic (e.g. social distancing) in an effort to limit this patient's exposure and mitigate transmission in our community.  Due to her co-morbid illnesses, this patient is at least at moderate risk for complications without adequate follow up.  This format is felt to be most appropriate for this patient at this time.  All issues noted in this document were discussed and addressed.  A limited physical exam was performed with this format.  Please refer to the patient's chart for [facility] consent to [facility].   Evaluation Performed:  Follow-up visit  Date:  04/08/2019   ID:  Alexandria Sherman, DOB Dec 26, 1945, MRN [id number]  Patient Location: Home Provider Location: Office  PCP:  Karie SchwalbeLetvak, Richard I, MD  Cardiologist:  No primary care provider on file.  Electrophysiologist:  None   Chief Complaint: Follow-up visit  History of Present Illness:    Alexandria Sherman is a 73 y.o. female who was seen today via phone visit for follow-up regarding  chronic venous insufficiency. She has known history of anxiety and depression, GERD, hypertension and thyroid problems. She has history of frequent falls over the last 2 years and she did have an injury in 2018 with subsequent bilateral leg weakness of unclear etiology.  She uses a wheeled walker for ambulation and reports no significant improvement with rehab. She was seen  for progressive bilateral leg edema with some darker discoloration.  She also noticed that her feet are cold externally although they do feel warm from the inside.  Due to these findings, there was concern about possible peripheral arterial disease and thus she was referred for testing. She underwent lower extremity arterial Doppler which showed normal ABI and toe pressure bilaterally with triphasic waveforms.  Venous Doppler was negative for  DVT.  She has chronic sinus tachycardia .  She is not a smoker and does not have history of diabetes. She had an echocardiogram done which showed normal LV systolic function with no significant valvular abnormalities.  I advised decrease salt intake, leg elevation and low-pressure knee-high support stockings.  She reports significant improvement since then.  She is wearing the 15 to 20 mmHg of compression stocking given that she had difficulty with a higher pressure.  She also has difficulty applying those and has been wearing them continuously for 3 days and then she takes them off.  The patient does not have symptoms concerning for COVID-19 infection (fever, chills, cough, or new shortness of breath).    Past Medical History:  Diagnosis Date   Anxiety disorder, unspecified    Depression 1978   Diverticulosis of colon 04/2005   GERD (gastroesophageal reflux disease)    Hypertension    Hyperthyroidism    Dr Tedd SiasSolum   Impaired fasting glucose    Low TSH level    chronic low, but euthyroid   Microscopic hematuria    RBBB    Sleep disturbance    Subclinical hyperthyroidism    unspecified   Tibia fracture 05/2010   left   Past Surgical History:  Procedure Laterality Date   ABDOMINAL HYSTERECTOMY  1988   hysterectomy/ BSO for endometriosis   BREAST BIOPSY Right 1992   neg   BREAST EXCISIONAL BIOPSY Right 1988   benign per pt   KNEE ARTHROSCOPY  6/12   left--Dr Charlann Boxerlin     Current Meds  Medication Sig  aspirin EC 81 MG tablet Take 81 mg daily by mouth.   calcium carbonate (OSCAL) 1500 (600 Ca) MG TABS tablet Take 600 mg of elemental calcium 2 (two) times daily by mouth.   cholecalciferol (VITAMIN D) 1000 UNITS tablet Take 2,000 Units by mouth daily.    clorazepate (TRANXENE) 3.75 MG tablet Take 1 tablet (3.75 mg total) by mouth 3 (three) times daily as needed for anxiety.   divalproex (DEPAKOTE) 125 MG DR tablet TAKE 1  TABLET BY MOUTH TWICE DAILY    furosemide (LASIX) 40 MG tablet 1 tablet daily as needed. No more than 2-3 tablets in a week.   glycerin adult 2 g suppository Place 1 suppository as needed rectally for constipation.   ibuprofen (ADVIL,MOTRIN) 200 MG tablet Take 200 mg by mouth every 6 (six) hours as needed.   latanoprost (XALATAN) 0.005 % ophthalmic solution Place 1 drop at bedtime into both eyes.    meclizine (ANTIVERT) 25 MG tablet Take 25 mg 3 (three) times daily as needed by mouth for dizziness.   mometasone (ELOCON) 0.1 % cream Apply 1 application topically 2 (two) times daily as needed (itching).   olopatadine (PATANOL) 0.1 % ophthalmic solution Place 1 drop 2 (two) times daily as needed into both eyes.    omeprazole (PRILOSEC) 20 MG capsule Take 1 capsule (20 mg total) by mouth daily.   timolol (TIMOPTIC) 0.5 % ophthalmic solution Place 1 drop daily into both eyes.   venlafaxine (EFFEXOR) 75 MG tablet TAKE 1 TABLET BY MOUTH TWICE DAILY   vitamin B-12 (CYANOCOBALAMIN) 1000 MCG tablet Take 1,000 mcg by mouth daily.   [DISCONTINUED] Calcium Carbonate (CALCIUM 500 PO) Take one by mouth twice a day      Allergies:   Cephalexin, Codeine phosphate, and Promethazine hcl   Social History   Tobacco Use   Smoking status: Never Smoker   Smokeless tobacco: Never Used  Substance Use Topics   Alcohol use: Yes    Comment: Occasional   Drug use: Not on file     Family Hx: The patient's family history includes Cancer in her mother, sister, and another family member; Dementia in an other family member; Depression in her mother, sister, and sister; Diabetes in her mother; Heart attack in an other family member; Stroke in an other family member. There is no history of Breast cancer or Thyroid disease.  ROS:   Please see the history of present illness.     All other systems reviewed and are negative.   Prior CV studies:   The following studies were reviewed today:   Labs/Other Tests and Data Reviewed:     EKG:  No ECG reviewed.  Recent Labs: 11/04/2018: ALT 13; BUN 27; Creatinine, Ser 0.64; Hemoglobin 14.3; Platelets 301.0; Potassium 4.4; Sodium 140   Recent Lipid Panel Lab Results  Component Value Date/Time   CHOL 179 11/04/2018 12:33 PM   TRIG 84.0 11/04/2018 12:33 PM   HDL 77.70 11/04/2018 12:33 PM   CHOLHDL 2 11/04/2018 12:33 PM   LDLCALC 85 11/04/2018 12:33 PM   LDLDIRECT 140.8 06/15/2009 10:45 AM    Wt Readings from Last 3 Encounters:  04/08/19 155 lb (70.3 kg)  01/23/19 150 lb (68 kg)  11/12/18 153 lb (69.4 kg)     Objective:    Vital Signs:  BP 126/88    Pulse 82    Ht 5\' 5"  (1.651 m)    Wt 155 lb (70.3 kg)    BMI 25.79 kg/m  VITAL SIGNS:  reviewed  ASSESSMENT & PLAN:    1. Chronic venous insufficiency with stasis dermatitis: No evidence of PAD .  Venous Doppler showed no evidence of DVT.  Improved symptoms with the above measures.  A lymphedema pump can be considered if the above does not work.  2.  Dyspnea with sinus tachycardia: She reports improvement overall.  Echocardiogram showed no significant abnormalities.   COVID-19 Education: The signs and symptoms of COVID-19 were discussed with the patient and how to seek care for testing (follow up with PCP or arrange E-visit).  The importance of social distancing was discussed today.  Time:   Today, I have spent 8 minutes with the patient with telehealth technology discussing the above problems.     Medication Adjustments/Labs and Tests Ordered: Current medicines are reviewed at length with the patient today.  Concerns regarding medicines are outlined above.   Tests Ordered: No orders of the defined types were placed in this encounter.   Medication Changes: No orders of the defined types were placed in this encounter.   Disposition: Office follow-up in 4 months.  Signed, Kathlyn Sacramento, MD  04/08/2019 2:28 PM    [place]

## 2019-04-08 NOTE — Patient Instructions (Signed)
Medication Instructions:  No change in medications If you need a refill on your cardiac medications before your next appointment, please call your pharmacy.   Lab work: None If you have labs (blood work) drawn today and your tests are completely normal, you will receive your results only by: Marland Kitchen MyChart Message (if you have MyChart) OR . A paper copy in the mail If you have any lab test that is abnormal or we need to change your treatment, we will call you to review the results.  Testing/Procedures: None  Follow-Up: At [facility], you and your health needs are our priority.  As part of our continuing mission to provide you with exceptional heart care, we have created designated Provider Care Teams.  These Care Teams include your primary Cardiologist (physician) and Advanced Practice Providers (APPs -  Physician Assistants and Nurse Practitioners) who all work together to provide you with the care you need, when you need it. You will need a follow up appointment in 4 months.  Please call our office 2 months in advance to schedule this appointment.  You may see Dr. Fletcher Anon or one of the following Advanced Practice Providers on your designated Care Team:   Murray Hodgkins, NP Christell Faith, PA-C . Marrianne Mood, PA-C

## 2019-05-12 ENCOUNTER — Other Ambulatory Visit: Payer: Self-pay | Admitting: Internal Medicine

## 2019-06-06 ENCOUNTER — Other Ambulatory Visit: Payer: Self-pay | Admitting: Internal Medicine

## 2019-06-06 DIAGNOSIS — Z1231 Encounter for screening mammogram for malignant neoplasm of breast: Secondary | ICD-10-CM

## 2019-07-14 ENCOUNTER — Other Ambulatory Visit: Payer: Self-pay | Admitting: Internal Medicine

## 2019-07-15 NOTE — Telephone Encounter (Signed)
Last filled 02-12-19 #180 Last OV 11-04-18 Next OV 11-10-19 [facility]

## 2019-07-17 ENCOUNTER — Other Ambulatory Visit: Payer: Self-pay

## 2019-07-17 ENCOUNTER — Telehealth (INDEPENDENT_AMBULATORY_CARE_PROVIDER_SITE_OTHER): Payer: Medicare Other | Admitting: Cardiovascular Disease

## 2019-07-17 ENCOUNTER — Encounter: Payer: Self-pay | Admitting: Cardiovascular Disease

## 2019-07-17 VITALS — BP 126/88 | HR 82 | Ht 65.0 in | Wt 159.0 lb

## 2019-07-17 DIAGNOSIS — I872 Venous insufficiency (chronic) (peripheral): Secondary | ICD-10-CM

## 2019-07-17 NOTE — Progress Notes (Signed)
Virtual Visit via Phone Note   This visit type was conducted due to national recommendations for restrictions regarding the COVID-19 Pandemic (e.g. social distancing) in an effort to limit this patient's exposure and mitigate transmission in our community.  Due to her co-morbid illnesses, this patient is at least at moderate risk for complications without adequate follow up.  This format is felt to be most appropriate for this patient at this time.  All issues noted in this document were discussed and addressed.  A limited physical exam was performed with this format.  Please refer to the patient's chart for her consent to telehealth for [facility].   Evaluation Performed:  Follow-up visit  Date:  07/17/2019   ID:  Alexandria Sherman, DOB 1945/12/23, MRN [id number]  Patient Location: Home Provider Location: Office  PCP:  Venia Carbon, MD  Cardiologist:  Kathlyn Sacramento, MD  Electrophysiologist:  None   Chief Complaint: Follow-up visit  History of Present Illness:    Alexandria Sherman is a 73 y.o. female who was seen today via phone visit for follow-up regarding  chronic venous insufficiency. She has known history of anxiety and depression, GERD, hypertension and thyroid problems. She has history of frequent falls over the last 2 years and she did have an injury in 2018 with subsequent bilateral leg weakness of unclear etiology.  She uses a wheeled walker for ambulation and reports no significant improvement with rehab. She was seen  for progressive bilateral leg edema with some darker discoloration.  She also noticed that her feet are cold externally although they do feel warm from the inside.  Due to these findings, there was concern about possible peripheral arterial disease and thus she was referred for testing. She underwent lower extremity arterial Doppler in February which showed normal ABI and toe pressure bilaterally with triphasic waveforms.  Venous Doppler was negative for  DVT. She has chronic sinus tachycardia .  She is not a smoker and does not have history of diabetes. She had an echocardiogram done which showed normal LV systolic function with no significant valvular abnormalities.  I advised decrease salt intake, leg elevation and low-pressure knee-high support stockings.  She reports significant improvement since then.  She is wearing the 15 to 20 mmHg of compression stocking given that she had difficulty with a higher pressure.  She also has difficulty applying those and has been wearing them continuously for 3 days and then she takes them off.  She reports stable symptoms overall.  She is concerned that the discoloration has not changed.  There is no lower extremity ulceration.  No chest pain or worsening dyspnea.  The patient does not have symptoms concerning for COVID-19 infection (fever, chills, cough, or new shortness of breath).    Past Medical History:  Diagnosis Date  . Anxiety disorder, unspecified   . Depression 1978  . Diverticulosis of colon 04/2005  . GERD (gastroesophageal reflux disease)   . Hypertension   . Hyperthyroidism    Dr Gabriel Carina  . Impaired fasting glucose   . Low TSH level    chronic low, but euthyroid  . Microscopic hematuria   . RBBB   . Sleep disturbance   . Subclinical hyperthyroidism    unspecified  . Tibia fracture 05/2010   left   Past Surgical History:  Procedure Laterality Date  . ABDOMINAL HYSTERECTOMY  1988   hysterectomy/ BSO for endometriosis  . BREAST BIOPSY Right 1992   neg  . BREAST EXCISIONAL BIOPSY  Right 1988   benign per pt  . KNEE ARTHROSCOPY  6/12   left--Dr Charlann Boxerlin     Current Meds  Medication Sig  . aspirin EC 81 MG tablet Take 81 mg by mouth 2 (two) times a week.   . calcium carbonate (OSCAL) 1500 (600 Ca) MG TABS tablet Take 600 mg of elemental calcium 2 (two) times daily by mouth.  . cholecalciferol (VITAMIN D) 1000 UNITS tablet Take 2,000 Units by mouth daily.   . clorazepate  (TRANXENE) 3.75 MG tablet TAKE ONE TABLET 3 TIMES DAILY AS NEEDED FOR ANXIETY  . divalproex (DEPAKOTE) 125 MG DR tablet TAKE 1  TABLET BY MOUTH TWICE DAILY  . furosemide (LASIX) 40 MG tablet 1 tablet daily as needed. No more than 2-3 tablets in a week.  Marland Kitchen. glycerin adult 2 g suppository Place 1 suppository as needed rectally for constipation.  Marland Kitchen. ibuprofen (ADVIL,MOTRIN) 200 MG tablet Take 200 mg by mouth every 6 (six) hours as needed.  . latanoprost (XALATAN) 0.005 % ophthalmic solution Place 1 drop at bedtime into both eyes.   . meclizine (ANTIVERT) 25 MG tablet Take 25 mg 3 (three) times daily as needed by mouth for dizziness.  . mometasone (ELOCON) 0.1 % cream Apply 1 application topically 2 (two) times daily as needed (itching).  Marland Kitchen. olopatadine (PATANOL) 0.1 % ophthalmic solution Place 1 drop 2 (two) times daily as needed into both eyes.   Marland Kitchen. omeprazole (PRILOSEC) 20 MG capsule TAKE 1 CAPSULE BY MOUTH DAILY  . timolol (TIMOPTIC) 0.5 % ophthalmic solution Place 1 drop daily into both eyes.  Marland Kitchen. venlafaxine (EFFEXOR) 75 MG tablet TAKE 1 TABLET BY MOUTH TWICE DAILY  . vitamin B-12 (CYANOCOBALAMIN) 1000 MCG tablet Take 1,000 mcg by mouth daily.  . [DISCONTINUED] Calcium Carbonate (CALCIUM 500 PO) Take one by mouth twice a day      Allergies:   Cephalexin, Codeine phosphate, and Promethazine hcl   Social History   Tobacco Use  . Smoking status: Never Smoker  . Smokeless tobacco: Never Used  Substance Use Topics  . Alcohol use: Yes    Comment: Occasional  . Drug use: Not on file     Family Hx: The patient's family history includes Cancer in her mother, sister, and another family member; Dementia in an other family member; Depression in her mother, sister, and sister; Diabetes in her mother; Heart attack in an other family member; Stroke in an other family member. There is no history of Breast cancer or Thyroid disease.  ROS:   Please see the history of present illness.     All other  systems reviewed and are negative.   Prior CV studies:   The following studies were reviewed today:   Labs/Other Tests and Data Reviewed:    EKG:  No ECG reviewed.  Recent Labs: 11/04/2018: ALT 13; BUN 27; Creatinine, Ser 0.64; Hemoglobin 14.3; Platelets 301.0; Potassium 4.4; Sodium 140   Recent Lipid Panel Lab Results  Component Value Date/Time   CHOL 179 11/04/2018 12:33 PM   TRIG 84.0 11/04/2018 12:33 PM   HDL 77.70 11/04/2018 12:33 PM   CHOLHDL 2 11/04/2018 12:33 PM   LDLCALC 85 11/04/2018 12:33 PM   LDLDIRECT 140.8 06/15/2009 10:45 AM    Wt Readings from Last 3 Encounters:  07/17/19 159 lb (72.1 kg)  04/08/19 155 lb (70.3 kg)  01/23/19 150 lb (68 kg)     Objective:    Vital Signs:  BP 126/88   Pulse 82  Ht 5\' 5"  (1.651 m)   Wt 159 lb (72.1 kg)   BMI 26.46 kg/m    VITAL SIGNS:  reviewed  ASSESSMENT & PLAN:    1. Chronic venous insufficiency with stasis dermatitis: No evidence of PAD .  Venous Doppler showed no evidence of DVT.  Improved symptoms with leg elevation and support stockings.  She is also thinking about purchasing inflatable leg wraps to be used on the days that she does not have the support stockings.  2.  Dyspnea with sinus tachycardia: She reports improvement overall.  Echocardiogram showed no significant abnormalities.   COVID-19 Education: The signs and symptoms of COVID-19 were discussed with the patient and how to seek care for testing (follow up with PCP or arrange E-visit).  The importance of social distancing was discussed today.  Time:   Today, I have spent 5 minutes with the patient with telehealth technology discussing the above problems.     Medication Adjustments/Labs and Tests Ordered: Current medicines are reviewed at length with the patient today.  Concerns regarding medicines are outlined above.   Tests Ordered: No orders of the defined types were placed in this encounter.   Medication Changes: No orders of the  defined types were placed in this encounter.   Disposition: Follow-up in person in 6 months.  Signed, , MD  07/17/2019 9:08 AM    [facility]

## 2019-07-17 NOTE — Patient Instructions (Signed)
Medication Instructions:  No change in medications *If you need a refill on your cardiac medications before your next appointment, please call your pharmacy*  Lab Work: None If you have labs (blood work) drawn today and your tests are completely normal, you will receive your results only by: Marland Kitchen MyChart Message (if you have MyChart) OR . A paper copy in the mail If you have any lab test that is abnormal or we need to change your treatment, we will call you to review the results.  Testing/Procedures: None  Follow-Up: At [facility], you and your health needs are our priority.  As part of our continuing mission to provide you with exceptional heart care, we have created designated Provider Care Teams.  These Care Teams include your primary Cardiologist (physician) and Advanced Practice Providers (APPs -  Physician Assistants and Nurse Practitioners) who all work together to provide you with the care you need, when you need it.  Your next appointment:   6 months  The format for your next appointment:   In Person  Provider:   Kathlyn Sacramento, MD  Other Instructions

## 2019-08-01 ENCOUNTER — Other Ambulatory Visit: Payer: Self-pay | Admitting: Internal Medicine

## 2019-08-05 ENCOUNTER — Ambulatory Visit
Admission: RE | Admit: 2019-08-05 | Discharge: 2019-08-05 | Disposition: A | Discharge status: Home / Self Care | Payer: Medicare Other | Source: Ambulatory Visit | Attending: Internal Medicine | Admitting: Internal Medicine

## 2019-08-05 DIAGNOSIS — Z1231 Encounter for screening mammogram for malignant neoplasm of breast: Secondary | ICD-10-CM

## 2019-09-17 ENCOUNTER — Telehealth: Payer: Medicare Other | Admitting: Cardiovascular Disease

## 2019-11-04 ENCOUNTER — Other Ambulatory Visit: Payer: Self-pay | Admitting: Internal Medicine

## 2019-11-05 NOTE — Telephone Encounter (Signed)
Last filled 07-15-19 #180 Last OV 11-04-18 Next OV 11-10-19 [facility]

## 2019-11-10 ENCOUNTER — Other Ambulatory Visit: Payer: Self-pay

## 2019-11-10 ENCOUNTER — Ambulatory Visit (INDEPENDENT_AMBULATORY_CARE_PROVIDER_SITE_OTHER): Payer: Medicare PPO | Admitting: Internal Medicine

## 2019-11-10 ENCOUNTER — Encounter: Payer: Self-pay | Admitting: Internal Medicine

## 2019-11-10 VITALS — BP 136/86 | HR 86 | Temp 97.4°F | Ht 62.0 in | Wt 152.0 lb

## 2019-11-10 DIAGNOSIS — F325 Major depressive disorder, single episode, in full remission: Secondary | ICD-10-CM

## 2019-11-10 DIAGNOSIS — I10 Essential (primary) hypertension: Secondary | ICD-10-CM | POA: Diagnosis not present

## 2019-11-10 DIAGNOSIS — Z7189 Other specified counseling: Secondary | ICD-10-CM

## 2019-11-10 DIAGNOSIS — F39 Unspecified mood [affective] disorder: Secondary | ICD-10-CM

## 2019-11-10 DIAGNOSIS — G629 Polyneuropathy, unspecified: Secondary | ICD-10-CM | POA: Diagnosis not present

## 2019-11-10 DIAGNOSIS — Z1211 Encounter for screening for malignant neoplasm of colon: Secondary | ICD-10-CM | POA: Diagnosis not present

## 2019-11-10 DIAGNOSIS — Z0000 Encounter for general adult medical examination without abnormal findings: Secondary | ICD-10-CM

## 2019-11-10 DIAGNOSIS — I872 Venous insufficiency (chronic) (peripheral): Secondary | ICD-10-CM | POA: Diagnosis not present

## 2019-11-10 LAB — COMPREHENSIVE METABOLIC PANEL
ALT: 12 U/L (ref 0–35)
AST: 18 U/L (ref 0–37)
Albumin: 4.2 g/dL (ref 3.5–5.2)
Alkaline Phosphatase: 67 U/L (ref 39–117)
BUN: 23 mg/dL (ref 6–23)
CO2: 31 mEq/L (ref 19–32)
Calcium: 10.4 mg/dL (ref 8.4–10.5)
Chloride: 100 mEq/L (ref 96–112)
Creatinine, Ser: 0.55 mg/dL (ref 0.40–1.20)
GFR: 107.74 mL/min (ref >60.00)
Glucose, Bld: 122 mg/dL — ABNORMAL HIGH (ref 70–99)
Potassium: 4.2 mEq/L (ref 3.5–5.1)
Sodium: 139 mEq/L (ref 135–145)
Total Bilirubin: 0.3 mg/dL (ref 0.2–1.2)
Total Protein: 7.4 g/dL (ref 6.0–8.3)

## 2019-11-10 LAB — CBC
HCT: 41.5 % (ref 36.0–46.0)
Hemoglobin: 13.8 g/dL (ref 12.0–15.0)
MCHC: 33.2 g/dL (ref 30.0–36.0)
MCV: 95.8 fl (ref 78.0–100.0)
Platelets: 271 10*3/uL (ref 150.0–400.0)
RBC: 4.33 Mil/uL (ref 3.87–5.11)
RDW: 13.1 % (ref 11.5–15.5)
WBC: 5.5 10*3/uL (ref 4.0–10.5)

## 2019-11-10 LAB — T4, FREE: Free T4: 0.81 ng/dL (ref 0.60–1.60)

## 2019-11-10 NOTE — Assessment & Plan Note (Signed)
Hasn't been able to get support hose on without putting holes in them

## 2019-11-10 NOTE — Assessment & Plan Note (Signed)
Ongoing weakness and balance issues Uses cane/walker Needs help with housework

## 2019-11-10 NOTE — Assessment & Plan Note (Signed)
See social history 

## 2019-11-10 NOTE — Assessment & Plan Note (Signed)
BP Readings from Last 3 Encounters:  11/10/19 136/86  07/17/19 126/88  04/08/19 126/88   Blood pressure okay still without Rx

## 2019-11-10 NOTE — Progress Notes (Signed)
Hearing Screening   Method: Audiometry   125Hz  250Hz  500Hz  1000Hz  2000Hz  3000Hz  4000Hz  6000Hz  8000Hz   Right ear:   40 0 40  0    Left ear:   40 0 0  0    Vision Screening Comments: October 2020

## 2019-11-10 NOTE — Assessment & Plan Note (Signed)
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

## 2019-11-10 NOTE — Progress Notes (Signed)
Subjective:    Patient ID: Alexandria Sherman, female    DOB: [birth year], 74 y.o.   MRN: [id number]  HPI Here for Medicare wellness visit and follow up of chronic health conditions This visit occurred during the SARS-CoV-2 public health emergency.  Safety protocols were in place, including screening questions prior to the visit, additional usage of staff PPE, and extensive cleaning of exam room while observing appropriate contact time as indicated for disinfecting solutions.   Reviewed advanced directives Reviewed other doctors-- Dr Arida--cardiology, Dr Philbert Riser, Dr Judithann Sheen Vision is poor--may need cataract surgery Poor hearing --just doesn't want aides No alcohol or tobacco Tries to walk some Chronic mood issues Mild memory issues---word finding problems  Still with weakness and balance issues Multiple falls--no injuries other than bruises Walks with crutches out, rollator at home Calls [facility] when she can't get up Has Stair glides to go up and down stairs Aide has brain cancer--can't help anymore. Hired someone else to clean and run errands with her (4 hours per week)  Has urine incontinence Does scheduled voids but still leaks Pads and mattress cover  Saw Dr Kirke Corin Chronic venous disease but not arterial Tries support hose---but tends to get holes in them quickly  Chronic depression She feels the medication still keeps it under control Feels lonesome at times Tends to stay isolated anyway--so not much change with COVID Still needs the clorazepate--most nights. Also for nerves during the day  No chest pain Some brief SOB upon first lying flat--goes away within seconds Rare headaches No dizziness or syncope  Continues on the omeprazole No heartburn on this Rare choke or cough--usually with a drink  Current Outpatient Medications on File Prior to Visit  Medication Sig Dispense Refill  . aspirin EC 81 MG tablet Take 81 mg by mouth 2 (two) times a  week.     . calcium carbonate (OSCAL) 1500 (600 Ca) MG TABS tablet Take 600 mg of elemental calcium 2 (two) times daily by mouth.    . cholecalciferol (VITAMIN D) 1000 UNITS tablet Take 2,000 Units by mouth daily.     . clorazepate (TRANXENE) 3.75 MG tablet TAKE ONE TABLET 3 TIMES DAILY AS NEEDED FOR ANXIETY 180 tablet 0  . divalproex (DEPAKOTE) 125 MG DR tablet TAKE 1 TABLET BY MOUTH TWICE DAILY 60 tablet 11  . glycerin adult 2 g suppository Place 1 suppository as needed rectally for constipation.    Marland Kitchen ibuprofen (ADVIL,MOTRIN) 200 MG tablet Take 200 mg by mouth every 6 (six) hours as needed.    . latanoprost (XALATAN) 0.005 % ophthalmic solution Place 1 drop at bedtime into both eyes.     . meclizine (ANTIVERT) 25 MG tablet Take 25 mg 3 (three) times daily as needed by mouth for dizziness.    . mometasone (ELOCON) 0.1 % cream Apply 1 application topically 2 (two) times daily as needed (itching). 45 g 1  . olopatadine (PATANOL) 0.1 % ophthalmic solution Place 1 drop 2 (two) times daily as needed into both eyes.     Marland Kitchen omeprazole (PRILOSEC) 20 MG capsule TAKE 1 CAPSULE BY MOUTH DAILY 90 capsule 3  . timolol (TIMOPTIC) 0.5 % ophthalmic solution Place 1 drop daily into both eyes.    Marland Kitchen venlafaxine (EFFEXOR) 75 MG tablet TAKE 1 TABLET BY MOUTH TWICE DAILY 60 tablet 11  . vitamin B-12 (CYANOCOBALAMIN) 1000 MCG tablet Take 1,000 mcg by mouth daily.    . [DISCONTINUED] Calcium Carbonate (CALCIUM 500 PO) Take one by mouth  twice a day      No current facility-administered medications on file prior to visit.    Allergies  Allergen Reactions  . Cephalexin     REACTION: vaginal blisters Tolerates penicillins  . Codeine Phosphate     REACTION: vomiting  . Promethazine Hcl     REACTION: nerves    Past Medical History:  Diagnosis Date  . Anxiety disorder, unspecified   . Depression 1978  . Diverticulosis of colon 04/2005  . GERD (gastroesophageal reflux disease)   . Hypertension   .  Hyperthyroidism    Dr Tedd Sias  . Impaired fasting glucose   . Low TSH level    chronic low, but euthyroid  . Microscopic hematuria   . RBBB   . Sleep disturbance   . Subclinical hyperthyroidism    unspecified  . Tibia fracture 05/2010   left    Past Surgical History:  Procedure Laterality Date  . ABDOMINAL HYSTERECTOMY  1988   hysterectomy/ BSO for endometriosis  . BREAST BIOPSY Right 1992   neg  . BREAST EXCISIONAL BIOPSY Right 1988   benign per pt  . KNEE ARTHROSCOPY  6/12   left--Dr Charlann Boxer    Family History  Problem Relation Age of Onset  . Cancer Mother        lung  . Diabetes Mother   . Depression Mother   . Cancer Sister        oral cancer  . Depression Sister   . Depression Sister   . Stroke Other   . Heart attack Other   . Cancer Other   . Dementia Other   . Breast cancer Neg Hx   . Thyroid disease Neg Hx     Social History   Socioeconomic History  . Marital status: Divorced    Spouse name: Not on file  . Number of children: 0  . Years of education: 16  . Highest education level: Bachelor's degree (e.g., BA, AB, BS)  Occupational History  . Occupation: Teacher--retired  Tobacco Use  . Smoking status: Never Smoker  . Smokeless tobacco: Never Used  Substance and Sexual Activity  . Alcohol use: Yes    Comment: Occasional  . Drug use: Not on file  . Sexual activity: Not on file  Other Topics Concern  . Not on file  Social History Narrative   Has living will   Both sisters have health care POA--- forms in chart   Now has DNR   No tube feeds if cognitively unaware   Social Determinants of Health   Financial Resource Strain:   . Difficulty of Paying Living Expenses: Not on file  Food Insecurity:   . Worried About Programme researcher, broadcasting/film/video in the Last Year: Not on file  . Ran Out of Food in the Last Year: Not on file  Transportation Needs:   . Lack of Transportation (Medical): Not on file  . Lack of Transportation (Non-Medical): Not on file   Physical Activity:   . Days of Exercise per Week: Not on file  . Minutes of Exercise per Session: Not on file  Stress:   . Feeling of Stress : Not on file  Social Connections:   . Frequency of Communication with Friends and Family: Not on file  . Frequency of Social Gatherings with Friends and Family: Not on file  . Attends Religious Services: Not on file  . Active Member of Clubs or Organizations: Not on file  . Attends Banker Meetings: Not  on file  . Marital Status: Not on file  Intimate Partner Violence:   . Fear of Current or Ex-Partner: Not on file  . Emotionally Abused: Not on file  . Physically Abused: Not on file  . Sexually Abused: Not on file   Review of Systems Appetite is good Weight is stable Sleeps okay with tranxene Wears seat belt Teeth are fine--- sees dentist No suspicious skin lesions     Objective:   Physical Exam  Constitutional: She is oriented to person, place, and time. No distress.  Unable to get up on table or comfortably sit in chair--just stands with crutches  Neck: No thyromegaly present.  Cardiovascular: Normal rate, regular rhythm and normal heart sounds. Exam reveals no gallop.  No murmur heard. Respiratory: Effort normal and breath sounds normal. No respiratory distress. She has no wheezes. She has no rales.  GI: Soft. There is no abdominal tenderness.  Musculoskeletal:     Comments: Trace edema in feet  Lymphadenopathy:    She has no cervical adenopathy.  Neurological: She is alert and oriented to person, place, and time.  Regino Bellow, Obama" [phone] [id number] Recall 3/3  Skin: No rash noted.  Psychiatric: She has a normal mood and affect. Her behavior is normal.           Assessment & Plan:

## 2019-11-10 NOTE — Assessment & Plan Note (Signed)
Gets anxiety regularly Uses it at bedtime and during the day for "frightening" things

## 2019-11-10 NOTE — Assessment & Plan Note (Signed)
I have personally reviewed the Medicare Annual Wellness questionnaire and have noted 1. The patient's medical and social history 2. Their use of alcohol, tobacco or illicit drugs 3. Their current medications and supplements 4. The patient's functional ability including ADL's, fall risks, home safety risks and hearing or visual             impairment. 5. Diet and physical activities 6. Evidence for depression or mood disorders  The patients weight, height, BMI and visual acuity have been recorded in the chart I have made referrals, counseling and provided education to the patient based review of the above and I have provided the pt with a written personalized care plan for preventive services.  I have provided you with a copy of your personalized plan for preventive services. Please take the time to review along with your updated medication list.  Td for any injury 2nd COVID vaccine due soon Will do FIT Consider mammogram in 2022 Discussed considering assisted living--she is not ready for this

## 2019-11-11 ENCOUNTER — Telehealth: Payer: Self-pay

## 2019-11-11 LAB — VALPROIC ACID LEVEL: Valproic Acid Lvl: 34.2 mg/L — ABNORMAL LOW (ref 50.0–100.0)

## 2019-11-11 NOTE — Telephone Encounter (Signed)
Per Dr Karle Starch note on her labs: See if she was fasting--if not, reinforce healthy eating is needed

## 2019-11-11 NOTE — Telephone Encounter (Signed)
Left message to call office

## 2019-11-12 NOTE — Telephone Encounter (Signed)
She said she was not fasting. She does not usually eat a lot of sugar but does eat crackers and bread and things like that. She will keep a better watch on what she eats and will retest next year.

## 2019-11-12 NOTE — Telephone Encounter (Signed)
Patient is returning your call.  

## 2019-12-30 DIAGNOSIS — H401132 Primary open-angle glaucoma, bilateral, moderate stage: Secondary | ICD-10-CM | POA: Diagnosis not present

## 2020-01-20 ENCOUNTER — Other Ambulatory Visit: Payer: Self-pay

## 2020-01-20 ENCOUNTER — Encounter: Payer: Self-pay | Admitting: Cardiovascular Disease

## 2020-01-20 ENCOUNTER — Ambulatory Visit (INDEPENDENT_AMBULATORY_CARE_PROVIDER_SITE_OTHER): Payer: Medicare PPO | Admitting: Cardiovascular Disease

## 2020-01-20 VITALS — BP 134/80 | HR 100 | Ht 62.0 in | Wt 152.0 lb

## 2020-01-20 DIAGNOSIS — I10 Essential (primary) hypertension: Secondary | ICD-10-CM

## 2020-01-20 DIAGNOSIS — I872 Venous insufficiency (chronic) (peripheral): Secondary | ICD-10-CM

## 2020-01-20 NOTE — Patient Instructions (Signed)
Medication Instructions:  Your physician recommends that you continue on your current medications as directed. Please refer to the Current Medication list given to you today.  We will work on ordering the lymphedema pumps and getting them approved with your insurance.  *If you need a refill on your cardiac medications before your next appointment, please call your pharmacy*   Lab Work: None ordered If you have labs (blood work) drawn today and your tests are completely normal, you will receive your results only by: Marland Kitchen MyChart Message (if you have MyChart) OR . A paper copy in the mail If you have any lab test that is abnormal or we need to change your treatment, we will call you to review the results.   Testing/Procedures: None ordered   Follow-Up: At [facility], you and your health needs are our priority.  As part of our continuing mission to provide you with exceptional heart care, we have created designated Provider Care Teams.  These Care Teams include your primary Cardiologist (physician) and Advanced Practice Providers (APPs -  Physician Assistants and Nurse Practitioners) who all work together to provide you with the care you need, when you need it.  We recommend signing up for the patient portal called "MyChart".  Sign up information is provided on this After Visit Summary.  MyChart is used to connect with patients for Virtual Visits (Telemedicine).  Patients are able to view lab/test results, encounter notes, upcoming appointments, etc.  Non-urgent messages can be sent to your provider as well.   To learn more about what you can do with MyChart, go to [email or web address].    Your next appointment:   3 month(s)  The format for your next appointment:   In Person  Provider:    You may see Lorine Bears, MD or one of the following Advanced Practice Providers on your designated Care Team:    Nicolasa Ducking, NP  Eula Listen, PA-C  Marisue Ivan,  PA-C    Other Instructions N/A

## 2020-01-20 NOTE — Progress Notes (Signed)
Cardiology Office Note   Date:  01/20/2020   ID:  Alexandria Sherman, DOB May 05, 1946, MRN [id number]  PCP:  Karie Schwalbe, MD  Cardiologist:   Lorine Bears, MD   Chief Complaint  Patient presents with  . other    6 month f/u c/o burning/itching,edema and sensation of feet feeling like "frost bite". Meds reviewed verbally with pt.      History of Present Illness: Alexandria Sherman is a 74 y.o. female who  chronic venous insufficiency. She has known history of anxiety and depression, GERD, hypertension and thyroid problems. She has history of frequent falls over the last 2 years and she did have an injury in 2018 with subsequent bilateral leg weakness of unclear etiology.  She uses a wheeled walker for ambulation and reports no significant improvement with rehab. She was seen  for progressive bilateral leg edema with some darker discoloration.  She also noticed that her feet are cold externally although they do feel warm from the inside.  Due to these findings, there was concern about possible peripheral arterial disease and thus she was referred for testing. She underwent vascular studies in February 2020.  Arterial Doppler showed normal ABI and toe pressure bilaterally with triphasic waveforms.  Venous Doppler was negative for DVT. She has chronic sinus tachycardia .  She is not a smoker and does not have history of diabetes. She had an echocardiogram done which showed normal LV systolic function with no significant valvular abnormalities. The patient has been treated with frequent leg elevation during the day as well as knee-high support stockings.  She has been doing this for more than a year with no significant improvement in symptoms.  Recently, she started having some superficial ulcerations.  She also has significant discomfort with heaviness, itching and pain that requires her to take ibuprofen frequently.  The dark discoloration is now extending to the calves.  The dark  discoloration does improve when she elevates her legs for 30 minutes.     Past Medical History:  Diagnosis Date  . Anxiety disorder, unspecified   . Depression 1978  . Diverticulosis of colon 04/2005  . GERD (gastroesophageal reflux disease)   . Hypertension   . Hyperthyroidism    Dr Tedd Sias  . Impaired fasting glucose   . Low TSH level    chronic low, but euthyroid  . Microscopic hematuria   . RBBB   . Sleep disturbance   . Subclinical hyperthyroidism    unspecified  . Tibia fracture 05/2010   left    Past Surgical History:  Procedure Laterality Date  . ABDOMINAL HYSTERECTOMY  1988   hysterectomy/ BSO for endometriosis  . BREAST BIOPSY Right 1992   neg  . BREAST EXCISIONAL BIOPSY Right 1988   benign per pt  . KNEE ARTHROSCOPY  6/12   left--Dr Charlann Boxer     Current Outpatient Medications  Medication Sig Dispense Refill  . aspirin EC 81 MG tablet Take 81 mg by mouth 2 (two) times a week.     . calcium carbonate (OSCAL) 1500 (600 Ca) MG TABS tablet Take 600 mg of elemental calcium 2 (two) times daily by mouth.    . cholecalciferol (VITAMIN D) 1000 UNITS tablet Take 2,000 Units by mouth daily.     . clorazepate (TRANXENE) 3.75 MG tablet TAKE ONE TABLET 3 TIMES DAILY AS NEEDED FOR ANXIETY 180 tablet 0  . divalproex (DEPAKOTE) 125 MG DR tablet TAKE 1 TABLET BY MOUTH TWICE DAILY 60 tablet  11  . glycerin adult 2 g suppository Place 1 suppository as needed rectally for constipation.    Marland Kitchen ibuprofen (ADVIL,MOTRIN) 200 MG tablet Take 200 mg by mouth every 6 (six) hours as needed.    . latanoprost (XALATAN) 0.005 % ophthalmic solution Place 1 drop at bedtime into both eyes.     . meclizine (ANTIVERT) 25 MG tablet Take 25 mg 3 (three) times daily as needed by mouth for dizziness.    . mometasone (ELOCON) 0.1 % cream Apply 1 application topically 2 (two) times daily as needed (itching). 45 g 1  . olopatadine (PATANOL) 0.1 % ophthalmic solution Place 1 drop 2 (two) times daily as needed  into both eyes.     Marland Kitchen omeprazole (PRILOSEC) 20 MG capsule TAKE 1 CAPSULE BY MOUTH DAILY 90 capsule 3  . timolol (TIMOPTIC) 0.5 % ophthalmic solution Place 1 drop daily into both eyes.    Marland Kitchen venlafaxine (EFFEXOR) 75 MG tablet TAKE 1 TABLET BY MOUTH TWICE DAILY 60 tablet 11  . vitamin B-12 (CYANOCOBALAMIN) 1000 MCG tablet Take 1,000 mcg by mouth daily.     No current facility-administered medications for this visit.    Allergies:   Cephalexin, Codeine phosphate, and Promethazine hcl    Social History:  The patient  reports that she has never smoked. She has never used smokeless tobacco. She reports current alcohol use.   Family History:  The patient's family history includes Cancer in her mother, sister, and another family member; Dementia in an other family member; Depression in her mother, sister, and sister; Diabetes in her mother; Heart attack in an other family member; Stroke in an other family member.    ROS:  Please see the history of present illness.   Otherwise, review of systems are positive for none.   All other systems are reviewed and negative.    PHYSICAL EXAM: VS:  BP 134/80 (BP Location: Left Arm, Patient Position: Sitting, Cuff Size: Normal)   Pulse 100   Ht 5\' 2"  (1.575 m)   Wt 152 lb (68.9 kg)   SpO2 97%   BMI 27.80 kg/m  , BMI Body mass index is 27.8 kg/m. GEN: Well nourished, well developed, in no acute distress  HEENT: normal  Neck: no JVD, carotid bruits, or masses Cardiac: RRR with tachycardia; no murmurs, rubs, or gallops. Respiratory:  clear to auscultation bilaterally, normal work of breathing GI: soft, nontender, nondistended, + BS MS: no deformity or atrophy  Skin: warm and dry, no rash Neuro:  Strength and sensation are intact Psych: euthymic mood, full affect Vascular: Dorsalis pedis is palpable bilaterally.  There is mild to moderate bilateral leg edema with significant dark discoloration all the way up to the knees.   EKG:  EKG is ordered  today. The ekg ordered today demonstrates sinus rhythm with right bundle branch block   Recent Labs: 11/10/2019: ALT 12; BUN 23; Creatinine, Ser 0.55; Hemoglobin 13.8; Platelets 271.0; Potassium 4.2; Sodium 139    Lipid Panel    Component Value Date/Time   CHOL 179 11/04/2018 1233   TRIG 84.0 11/04/2018 1233   HDL 77.70 11/04/2018 1233   CHOLHDL 2 11/04/2018 1233   VLDL 16.8 11/04/2018 1233   LDLCALC 85 11/04/2018 1233   LDLDIRECT 140.8 06/15/2009 1045      Wt Readings from Last 3 Encounters:  01/20/20 152 lb (68.9 kg)  11/10/19 152 lb (68.9 kg)  07/17/19 159 lb (72.1 kg)       PAD Screen 11/12/2018  Previous PAD dx? Yes  Previous surgical procedure? No  Feet/toe relief with dangling? Yes  Painful, non-healing ulcers? No  Extremities discolored? Yes      ASSESSMENT AND PLAN:  1.  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  The patient has been treating with leg elevation and support stockings for more than a year with no significant improvement.  She is having more symptoms of pain, itching and cold sensation.  Due to that, I recommend a lymphedema pump to try to improve her symptoms.   Her feet are both cold and I wonder if she has a component of small vessel arterial disease as well.  2.  Dyspnea with sinus tachycardia: She reports improvement overall.  Echocardiogram showed no significant abnormalities.    Disposition:   FU with me in 3 months  Signed,  Kathlyn Sacramento, MD  01/20/2020 1:37 PM    [place]

## 2020-01-27 ENCOUNTER — Telehealth: Payer: Self-pay

## 2020-01-27 NOTE — Telephone Encounter (Signed)
Order form for compression therapy boots, patient demo sheet and Dr. Kirke Corin last office mote faxed to [employer] fax# [phone]. Fax confirmation received.

## 2020-01-30 ENCOUNTER — Telehealth: Payer: Self-pay

## 2020-01-30 NOTE — Telephone Encounter (Signed)
Called the patient. Alexandria Sherman Patient needs an appt with Dr. Kirke Corin prior to 02/19/20 for documentation regarding her Lymphedema to meet her insurance requirement for documentation for the patient to qualify for lympha press boots therapy.

## 2020-02-02 NOTE — Telephone Encounter (Signed)
To Misty Stanley, RN for Dr. Kirke Corin to review.  I only see 1 appt with Dr. Kirke Corin prior to 5/27, which is a "7 day hold" spot on 5/18- unsure if you want to use this slot or make an additional spot for the patient.

## 2020-02-02 NOTE — Telephone Encounter (Signed)
Patient states she is supposed to have 2 appointments before 5/27, please call and advise.

## 2020-02-03 NOTE — Telephone Encounter (Addendum)
Patient has also sent a mychart message. See 01/31/20 mychart message for documentation. Patient has a 02/06/20 appt with Gillian Shields, NP.

## 2020-02-04 ENCOUNTER — Telehealth: Payer: Self-pay | Admitting: Cardiovascular Disease

## 2020-02-04 NOTE — Telephone Encounter (Signed)
Calling to speak with Alexandria Sherman regarding some medical equipment that has been ordered

## 2020-02-04 NOTE — Telephone Encounter (Signed)
Spoke with Maudie Mercury @ [employer]. She was following up regarding the fax she sent last week listing the requirements needed to be met for the patient lympha press order. Adv her that I did receive the fax and have no questions at this time.

## 2020-02-06 ENCOUNTER — Encounter: Payer: Self-pay | Admitting: Family

## 2020-02-06 ENCOUNTER — Ambulatory Visit (INDEPENDENT_AMBULATORY_CARE_PROVIDER_SITE_OTHER): Payer: Medicare PPO | Admitting: Family

## 2020-02-06 ENCOUNTER — Other Ambulatory Visit: Payer: Self-pay

## 2020-02-06 VITALS — BP 154/105 | HR 92 | Ht 62.0 in

## 2020-02-06 DIAGNOSIS — I872 Venous insufficiency (chronic) (peripheral): Secondary | ICD-10-CM | POA: Diagnosis not present

## 2020-02-06 DIAGNOSIS — R000 Tachycardia, unspecified: Secondary | ICD-10-CM | POA: Diagnosis not present

## 2020-02-06 DIAGNOSIS — R600 Localized edema: Secondary | ICD-10-CM

## 2020-02-06 DIAGNOSIS — I890 Lymphedema, not elsewhere classified: Secondary | ICD-10-CM | POA: Diagnosis not present

## 2020-02-06 NOTE — Patient Instructions (Signed)
Medication Instructions:  No medication changes today.   *If you need a refill on your cardiac medications before your next appointment, please call your pharmacy*   Lab Work: No lab work today.   Testing/Procedures: Your EKG today showed normal sinus rhythm with right bundle branch block. This is a stable finding.    Follow-Up: At [facility], you and your health needs are our priority.  As part of our continuing mission to provide you with exceptional heart care, we have created designated Provider Care Teams.  These Care Teams include your primary Cardiologist (physician) and Advanced Practice Providers (APPs -  Physician Assistants and Nurse Practitioners) who all work together to provide you with the care you need, when you need it.  We recommend signing up for the patient portal called "MyChart".  Sign up information is provided on this After Visit Summary.  MyChart is used to connect with patients for Virtual Visits (Telemedicine).  Patients are able to view lab/test results, encounter notes, upcoming appointments, etc.  Non-urgent messages can be sent to your provider as well.   To learn more about what you can do with MyChart, go to [email or web address].    Your next appointment:   In July   The format for your next appointment:   In Person  Provider:   You may see Lorine Bears, MD or one of the following Advanced Practice Providers on your designated Care Team:    Nicolasa Ducking, NP  Eula Listen, PA-C  Marisue Ivan, PA-C  Gillian Shields, NP  Other Instructions  Continue to keep your legs elevated, wear your compression wraps, avoid salt, and continue walking.  We will send all the paperwork about the lymphadema pumps into the insurance company.

## 2020-02-06 NOTE — Progress Notes (Signed)
Office Visit    Patient Name: Alexandria Sherman Date of Encounter: 02/06/2020  Primary Care Provider:  Karie Schwalbe, MD Primary Cardiologist:  Lorine Bears, MD Electrophysiologist:  None   Chief Complaint    Alexandria Sherman is a 74 y.o. female with a hx of chronic venous insufficiency, anxiety, depression, GERD, HTN, thyroid disorder presents today for follow-up of chronic venous insufficiency, lymphedema, lower extremity edema  Past Medical History    Past Medical History:  Diagnosis Date  . Anxiety disorder, unspecified   . Depression 1978  . Diverticulosis of colon 04/2005  . GERD (gastroesophageal reflux disease)   . Hypertension   . Hyperthyroidism    Dr Tedd Sias  . Impaired fasting glucose   . Low TSH level    chronic low, but euthyroid  . Microscopic hematuria   . RBBB   . Sleep disturbance   . Subclinical hyperthyroidism    unspecified  . Tibia fracture 05/2010   left   Past Surgical History:  Procedure Laterality Date  . ABDOMINAL HYSTERECTOMY  1988   hysterectomy/ BSO for endometriosis  . BREAST BIOPSY Right 1992   neg  . BREAST EXCISIONAL BIOPSY Right 1988   benign per pt  . KNEE ARTHROSCOPY  6/12   left--Dr Charlann Boxer    Allergies  Allergies  Allergen Reactions  . Cephalexin     REACTION: vaginal blisters Tolerates penicillins  . Codeine Phosphate     REACTION: vomiting  . Promethazine Hcl     REACTION: nerves    History of Present Illness    Alexandria Sherman is a 74 y.o. female with a hx of chronic venous insufficiency, anxiety, depression, GERD, HTN, thyroid disorder, lymphedema.  She was last seen 01/20/2020 by Dr. Kirke Corin.   She has a history of frequent falls over the last 2 years and did have injury in 2018 subsequent bilateral leg weakness is unclear etiology.  She uses a wheeled [employer] Salce for ambulation and reports no significant improvement with rehab.  She underwent vascular studies February 2020.  Arterial Doppler with normal ABI and toe  pressure bilaterally with triphasic waveforms.  Venous Doppler negative for DVT.  Echocardiogram 01/2019 showing normal LVEF 60-65%, moderate LVH, grade 1 diastolic dysfunction, RV normal size and function, LA mildly dilated, no significant valvular abnormalities.  She is a non-smoker and does not have a history of diabetes.   She is very hopeful to get lymphadema pumps to help with her swelling. Spoke with representative from [employer] named Florentina Addison and told me she was concerned she may have misspoke.  She was asked how long her legs bother her before she saw cardiologist.  She tells me she underestimated and told Katie 8 months.  After the fact recalled that she had asked her internal medicine doctor for 2 years about her leg swelling prior to being sent to Dr. Kirke Corin.  She is seeing Dr. Kirke Corin for greater than 1 year.  Insurance authorization was previously trialed last year for lymphedema pumps but was denied.    This visit is a 4-week follow-up after her recent visit with Dr. Kirke Corin to assess her response to conservative therapies.  She endorses eating a low-sodium diet, does not add any salt to her food and has gotten rid of her salt.  She keeps her legs elevated throughout the day while sitting and laying-tells me she does not spend much time standing on her feet.  She does have a walking regimen to help strengthen the muscles  in her legs and she does this every day.   She wears compression leg wraps that go from her feet to knees.  She previously trialed compression stockings but was unable to get them on even with help.  She was routinely getting holes in the stockings and could not wear them.  However she tells me her compression leg wraps she is able to tolerate due to pain only for 30 minutes at a time but does wear them twice per day.   She mentions seeing something online about a foot massager that could help with swelling.  She is very helpful to get the lymphedema pumps.  She was initially  recommended for conservative treatments on 04/08/2019.  On 07/17/2019 via telemedicine she noted continued stable symptoms and continued discoloration.  At that time she did not note lower extremity ulceration.  She has been doing frequent leg elevation as well as knee-high compression wraps for greater than 1 year with no significant improvement in symptoms.  Light April she was noted to be having some superficial ulcerations, heaviness, itching, pain.  The discoloration in her legs was noted to be newly extending into her calves.  She reports she has had continued lower extremity edema, discoloration, "water blisters", and discoloration despite her conservative measures.   EKGs/Labs/Other Studies Reviewed:   The following studies were reviewed today: Echo 02/13/2019 FINDINGS   Left Ventricle: The left ventricle has normal systolic function, with an  ejection fraction of 60-65%. The cavity size was normal. There is  moderately increased left ventricular wall thickness. Left ventricular  diastolic Doppler parameters are consistent   with impaired relaxation.   Right Ventricle: The right ventricle has normal systolic function. The  cavity was normal. There is no increase in right ventricular wall  thickness.   Left Atrium: Left atrial size was mildly dilated.   Right Atrium: Right atrial size was normal in size. Right atrial pressure  is estimated at 10 mmHg.   Interatrial Septum: No atrial level shunt detected by color flow Doppler.   Pericardium: There is no evidence of pericardial effusion.   Mitral Valve: The mitral valve is normal in structure. Mitral valve  regurgitation is mild by color flow Doppler.   Tricuspid Valve: The tricuspid valve is normal in structure. Tricuspid  valve regurgitation is mild by color flow Doppler.   Aortic Valve: The aortic valve is grossly normal Aortic valve  regurgitation was not assessed by color flow Doppler.   Pulmonic Valve: The pulmonic valve  was grossly normal. Pulmonic valve  regurgitation was not assessed by color flow Doppler.   Venous: The inferior vena cava measures 1.60 cm, is normal in size with  greater than 50% respiratory variability.   Lower extremity duplex 11/13/2018 Summary:  Right: No evidence of deep vein thrombosis in the lower extremity. No  indirect evidence of obstruction proximal to the inguinal ligament.  Ultrasound characteristics of enlarged lymph nodes are noted in the groin.  Left: No evidence of deep vein thrombosis in the lower extremity. No  indirect evidence of obstruction proximal to the inguinal ligament. A  cystic structure is found in the popliteal fossa.   Lower extremity ABI 11/13/2018 Summary:  Right: Resting right ankle-brachial index is within normal range. No  evidence of significant right lower extremity arterial disease. The right  toe-brachial index is normal.   Left: Resting left ankle-brachial index is within normal range. No  evidence of significant left lower extremity arterial disease. The left  toe-brachial index  is normal.   EKG:  EKG is  ordered today.  The ekg ordered today demonstrates sinus rhythm 93 bpm with stable RBBB  Recent Labs: 11/10/2019: ALT 12; BUN 23; Creatinine, Ser 0.55; Hemoglobin 13.8; Platelets 271.0; Potassium 4.2; Sodium 139  Recent Lipid Panel    Component Value Date/Time   CHOL 179 11/04/2018 1233   TRIG 84.0 11/04/2018 1233   HDL 77.70 11/04/2018 1233   CHOLHDL 2 11/04/2018 1233   VLDL 16.8 11/04/2018 1233   LDLCALC 85 11/04/2018 1233   LDLDIRECT 140.8 06/15/2009 1045    Home Medications   Current Meds  Medication Sig  . aspirin EC 81 MG tablet Take 81 mg by mouth 2 (two) times a week.   . calcium carbonate (OSCAL) 1500 (600 Ca) MG TABS tablet Take 600 mg of elemental calcium 2 (two) times daily by mouth.  . cholecalciferol (VITAMIN D) 1000 UNITS tablet Take 2,000 Units by mouth daily.   . clorazepate (TRANXENE) 3.75 MG tablet TAKE ONE  TABLET 3 TIMES DAILY AS NEEDED FOR ANXIETY  . divalproex (DEPAKOTE) 125 MG DR tablet TAKE 1 TABLET BY MOUTH TWICE DAILY  . glycerin adult 2 g suppository Place 1 suppository as needed rectally for constipation.  Marland Kitchen ibuprofen (ADVIL,MOTRIN) 200 MG tablet Take 200 mg by mouth every 6 (six) hours as needed.  . latanoprost (XALATAN) 0.005 % ophthalmic solution Place 1 drop at bedtime into both eyes.   . meclizine (ANTIVERT) 25 MG tablet Take 25 mg 3 (three) times daily as needed by mouth for dizziness.  . mometasone (ELOCON) 0.1 % cream Apply 1 application topically 2 (two) times daily as needed (itching).  Marland Kitchen olopatadine (PATANOL) 0.1 % ophthalmic solution Place 1 drop 2 (two) times daily as needed into both eyes.   Marland Kitchen omeprazole (PRILOSEC) 20 MG capsule TAKE 1 CAPSULE BY MOUTH DAILY  . timolol (TIMOPTIC) 0.5 % ophthalmic solution Place 1 drop daily into both eyes.  Marland Kitchen venlafaxine (EFFEXOR) 75 MG tablet TAKE 1 TABLET BY MOUTH TWICE DAILY  . vitamin B-12 (CYANOCOBALAMIN) 1000 MCG tablet Take 1,000 mcg by mouth daily.   Review of Systems    Review of Systems  Constitution: Negative for chills, fever and malaise/fatigue.  Cardiovascular: Positive for leg swelling. Negative for chest pain, dyspnea on exertion, near-syncope, orthopnea, palpitations and syncope.  Respiratory: Negative for cough, shortness of breath and wheezing.   Skin: Positive for color change (bilateral lower extremities).       (+) "water blisters" to bilateral LE  Musculoskeletal:       (+) difficulty with ambulation  Gastrointestinal: Negative for nausea and vomiting.  Neurological: Negative for dizziness, light-headedness and weakness.   All other systems reviewed and are otherwise negative except as noted above.  Physical Exam    VS:  BP (!) 154/105   Pulse 92   Ht 5\' 2"  (1.575 m)   SpO2 94%   BMI 27.80 kg/m  , BMI Body mass index is 27.8 kg/m. GEN: Well nourished, well developed, in no acute distress. HEENT:  normal. Neck: Supple, no JVD, carotid bruits, or masses. Cardiac: RRR, no murmurs, rubs, or gallops. No clubbing.  Radials 2+ and equal bilaterally.  VVS: Bilateral dorsalis pedis pulses palpable. Bilateral 4+ pitting edema from foot to knee. Significant hyperpigmentation and redness from foor to knee. Stage 3 lymphadema. Noted "water blister" to bilateral lower extremity. Venous stasis ulcer to right medial maleous that is 0.5" in diameter without signs of infection or exudate.  Respiratory:  Respirations regular and unlabored, clear to auscultation bilaterally. GI: Soft, nontender, nondistended, BS + x 4. MS: No deformity or atrophy. Skin: Warm and dry, no rash. Neuro:  Strength and sensation are intact. Psych: Normal affect.  Assessment & Plan    1. Severe chronic venous insufficiency with stasis dermatitis/Lymphadema - Complicated by frequent superficial ulceration. On exam she has 4+ pitting edema to her bilateral lower extremities up to the knee despite conservative treatment. Noted venous stasis to right medial malleolus without signs of infection. She has failed management with elevation, exercise, compression. Echo 10/2018 with no evidence of heart failure. Plan to continue elevation, exercise, compression. Will send paperwork to request Lympha Press for symptom management.   2. Dyspnea with sinus tachycardia -EKG today NSR 93 bpm.  Echo 10/2018 with no significant abnormalities.  Reports improvement with adding walking regimen.  Disposition: Follow up in 2 month(s) with Dr. Kirke Corin or APP   Alver Sorrow, NP 02/06/2020, 4:31 PM

## 2020-02-10 ENCOUNTER — Other Ambulatory Visit: Payer: Self-pay | Admitting: Internal Medicine

## 2020-02-10 NOTE — Telephone Encounter (Signed)
Additional document faxed to [facility]. Gillian Shields, NP 02/06/20 o/v - Completed lymphedema assessment form

## 2020-02-11 NOTE — Telephone Encounter (Signed)
Last filled 11-06-19 #180 Last OV 11-10-19 Next OV 11-22-20 [facility]

## 2020-03-03 DIAGNOSIS — I890 Lymphedema, not elsewhere classified: Secondary | ICD-10-CM | POA: Diagnosis not present

## 2020-03-04 NOTE — Telephone Encounter (Signed)
Contacted [employer] to get an update on the order placed for the patients lymphapress. lmtcb on [employer]. Her direct line [phone]

## 2020-03-23 NOTE — Telephone Encounter (Addendum)
Contacted [employer]. 2nd attempt to check the status of the patients order. lmtcb for [employer]. [phone].

## 2020-04-13 NOTE — Telephone Encounter (Signed)
Attempted again to contact [employer] to get an update on the order for the patient lympha press that were ordered back in May 2021.

## 2020-04-20 ENCOUNTER — Other Ambulatory Visit: Payer: Self-pay | Admitting: Internal Medicine

## 2020-04-20 ENCOUNTER — Ambulatory Visit: Payer: Medicare PPO | Admitting: Physician Assistant

## 2020-04-26 NOTE — Telephone Encounter (Signed)
Contacted [employer] main telephone customer service and was advised need to be transferred to the case consultant Judeth Cornfield.  I have 3 prior messages for Judeth Cornfield to vall me to provide an update on the order for Lymphapress boots. Left another message for Judeth Cornfield to call back to provide an update.

## 2020-04-26 NOTE — Telephone Encounter (Signed)
Spoke with [facility] @ [employer]. The patients order was approved by her insurance and she received her lymphapress boots in June 2021.  No further action required. Closing this encounter.

## 2020-05-10 ENCOUNTER — Ambulatory Visit (INDEPENDENT_AMBULATORY_CARE_PROVIDER_SITE_OTHER): Payer: Medicare PPO | Admitting: Physician Assistant

## 2020-05-10 ENCOUNTER — Encounter: Payer: Self-pay | Admitting: Physician Assistant

## 2020-05-10 ENCOUNTER — Other Ambulatory Visit: Payer: Self-pay

## 2020-05-10 VITALS — BP 132/88 | HR 97 | Ht 62.0 in

## 2020-05-10 DIAGNOSIS — Z7409 Other reduced mobility: Secondary | ICD-10-CM

## 2020-05-10 DIAGNOSIS — R011 Cardiac murmur, unspecified: Secondary | ICD-10-CM | POA: Diagnosis not present

## 2020-05-10 DIAGNOSIS — I10 Essential (primary) hypertension: Secondary | ICD-10-CM | POA: Diagnosis not present

## 2020-05-10 DIAGNOSIS — I872 Venous insufficiency (chronic) (peripheral): Secondary | ICD-10-CM

## 2020-05-10 NOTE — Patient Instructions (Signed)
Medication Instructions:  Your physician recommends that you continue on your current medications as directed. Please refer to the Current Medication list given to you today.  *If you need a refill on your cardiac medications before your next appointment, please call your pharmacy*   Lab Work: None ordered If you have labs (blood work) drawn today and your tests are completely normal, you will receive your results only by: . MyChart Message (if you have MyChart) OR . A paper copy in the mail If you have any lab test that is abnormal or we need to change your treatment, we will call you to review the results.   Testing/Procedures: Your physician has requested that you have an echocardiogram. Echocardiography is a painless test that uses sound waves to create images of your heart. It provides your doctor with information about the size and shape of your heart and how well your heart's chambers and valves are working. This procedure takes approximately one hour. There are no restrictions for this procedure.     Follow-Up: At [facility], you and your health needs are our priority.  As part of our continuing mission to provide you with exceptional heart care, we have created designated Provider Care Teams.  These Care Teams include your primary Cardiologist (physician) and Advanced Practice Providers (APPs -  Physician Assistants and Nurse Practitioners) who all work together to provide you with the care you need, when you need it.  We recommend signing up for the patient portal called "MyChart".  Sign up information is provided on this After Visit Summary.  MyChart is used to connect with patients for Virtual Visits (Telemedicine).  Patients are able to view lab/test results, encounter notes, upcoming appointments, etc.  Non-urgent messages can be sent to your provider as well.   To learn more about what you can do with MyChart, go to [email or web address].    Your next appointment:   6  month(s)  The format for your next appointment:   In Person  Provider:    You may see Muhammad Arida, MD or one of the following Advanced Practice Providers on your designated Care Team:    Christopher Berge, NP  Ryan Dunn, PA-C  Jacquelyn Visser, PA-C    Other Instructions N/A  

## 2020-05-10 NOTE — Progress Notes (Signed)
Office Visit    Patient Name: Alexandria Sherman Date of Encounter: 05/10/2020  Primary Care Provider:  Karie Schwalbe, MD Primary Cardiologist:  Lorine Bears, MD  Chief Complaint    Chief Complaint  Patient presents with  . Follow-up    3 Month follow up. Still having the bilateral ankle/leg swelling but they are improving. Medications verbally reviewed with patient.     74 year old female with history of chronic venous insufficiency, anxiety, depression, GERD, hypertension, and thyroid disorder who presents for follow-up of venous insufficiency, lymphedema, and lower extremity edema, and here for lymphedema follow-up.  Past Medical History    Past Medical History:  Diagnosis Date  . Anxiety disorder, unspecified   . Depression 1978  . Diverticulosis of colon 04/2005  . GERD (gastroesophageal reflux disease)   . Hypertension   . Hyperthyroidism    Dr Tedd Sias  . Impaired fasting glucose   . Low TSH level    chronic low, but euthyroid  . Microscopic hematuria   . RBBB   . Sleep disturbance   . Subclinical hyperthyroidism    unspecified  . Tibia fracture 05/2010   left   Past Surgical History:  Procedure Laterality Date  . ABDOMINAL HYSTERECTOMY  1988   hysterectomy/ BSO for endometriosis  . BREAST BIOPSY Right 1992   neg  . BREAST EXCISIONAL BIOPSY Right 1988   benign per pt  . KNEE ARTHROSCOPY  6/12   left--Dr Charlann Boxer    Allergies  Allergies  Allergen Reactions  . Cephalexin     REACTION: vaginal blisters Tolerates penicillins  . Codeine Phosphate     REACTION: vomiting  . Promethazine Hcl     REACTION: nerves    History of Present Illness    Alexandria Sherman is a 74 y.o. female with PMH as above.  She has no history of smoking or diabetes.  She was last seen in the office 02/06/2020 by Gillian Shields, NP. She has a history of frequent falls over the last 2 years.  She had an injury in 2018 with subsequent bilateral leg weakness of unclear etiology.   She uses a wheeled walker for ambulation and reports no significant improvement with rehab.Today she is in a wheelchair.  She underwent vascular studies 10/2018.  Arterial Doppler with normal ABI and toe pressures bilaterally with triphasic waveforms.  Venous Doppler was negative for DVT.  Echo 01/2019 showed LVEF 60 to 65%, moderate LVH, G1 DD, mild LAE.  When last seen in clinic, she was very hopeful to get lymphedema pumps to help with her swelling.  A representative from [employer] name Florentina Addison was contacted and concerned she may have misspoke.  She had informed Florentina Addison that her legs have been bothering her for 8 months.  She reported eating a low-sodium diet and was keeping her legs elevated.  She reported a walking regimen to help strengthen the leg muscles.  She wore compression stockings up to the level of her knees.  Today, she reports that she has been doing well since her last visit.  She has received her lymphedema pumps and sleeves and reports that they are working well with significant improvement in both her bilateral edema and her cyanosis noted on previous visits.  Since she has noticed so much improvement with use of current lymphedema pumps, she is wondering if her lymphedema pump settings should be adjusted for further improvement in lower extremity edema and cyanosis.  Discussed with primary cardiologist with recommendation to keep pumps  at current settings.  She reports improving cyanosis with improved reperfusion time noted by pt at home (with squeezing / pressing on her foot at home).  On exam, bilateral cyanosis still noted and up to the level of her calf.  Previously noted medial malleolus ulcer is still healing but without infection.  She expresses frustration regarding her slow rate of healing of this ulcer.  She also reports ongoing problems with heel chafing, which she states is unchanged from previous visits, though painful to her.  No signs of infection on exam, though heels are  currently bandaged with fresh bandages and thus difficult to inspect.  Per patient, there is no current infection.  We discussed several ways to reduce chafing, including elevation of lower extremities/heels while sleeping.  She reports ongoing lower extremity coolness/cold temperature of her legs to the touch but does not report further lower extremity itching today.  She feels the lower extremity coolness is improving with the pumps.  No s/sx of claudication, though she notes that she is mainly sedentary at this time.  Pulses confirmed by Doppler today.  She denies chest pain, palpitations, dyspnea, pnd, orthopnea, n, v, dizziness, syncope, weight gain, or early satiety.  She is sedentary and reports significant trouble with coming into visits.  She is not able to ambulate well on her own as well as complete her daily ADLs.  She is curious if future visits can be scheduled out further, rather than following up at 32-month intervals.    Home Medications    Prior to Admission medications   Medication Sig Start Date End Date Taking? Authorizing Provider  aspirin EC 81 MG tablet Take 81 mg by mouth 2 (two) times a week.     [provider]  calcium carbonate (OSCAL) 1500 (600 Ca) MG TABS tablet Take 600 mg of elemental calcium 2 (two) times daily by mouth.    [provider]  cholecalciferol (VITAMIN D) 1000 UNITS tablet Take 2,000 Units by mouth daily.     [provider]  clorazepate (TRANXENE) 3.75 MG tablet TAKE ONE TABLET 3 TIMES DAILY AS NEEDED FOR ANXIETY 02/11/20   Lorre Munroe, NP  divalproex (DEPAKOTE) 125 MG DR tablet TAKE 1 TABLET BY MOUTH TWICE DAILY 08/01/19   Tillman Abide I, MD  glycerin adult 2 g suppository Place 1 suppository as needed rectally for constipation.    [provider]  ibuprofen (ADVIL,MOTRIN) 200 MG tablet Take 200 mg by mouth every 6 (six) hours as needed.    [provider]  latanoprost (XALATAN) 0.005 % ophthalmic solution  Place 1 drop at bedtime into both eyes.  01/15/12   [provider]  meclizine (ANTIVERT) 25 MG tablet Take 25 mg 3 (three) times daily as needed by mouth for dizziness.    [provider]  mometasone (ELOCON) 0.1 % cream Apply 1 application topically 2 (two) times daily as needed (itching). 12/20/17   Karie Schwalbe, MD  olopatadine (PATANOL) 0.1 % ophthalmic solution Place 1 drop 2 (two) times daily as needed into both eyes.  09/16/12   Karie Schwalbe, MD  omeprazole (PRILOSEC) 20 MG capsule TAKE 1 CAPSULE BY MOUTH EVERY DAY 04/20/20   Tillman Abide I, MD  timolol (TIMOPTIC) 0.5 % ophthalmic solution Place 1 drop daily into both eyes.    [provider]  venlafaxine (EFFEXOR) 75 MG tablet TAKE 1 TABLET BY MOUTH TWICE DAILY 08/01/19   Karie Schwalbe, MD  vitamin B-12 (CYANOCOBALAMIN) 1000  MCG tablet Take 1,000 mcg by mouth daily.    [provider]  Calcium Carbonate (CALCIUM 500 PO) Take one by mouth twice a day   07/17/19  [provider]    Review of Systems    She denies chest pain, palpitations, dyspnea, pnd, orthopnea, n, v, dizziness, syncope, weight gain, or early satiety.  She reports improving lower extremity edema/improvement of edema with lymphedema pumps.  She reports improving cyanosis and right medial malleolus ulcer.  She reports ongoing LE coolness though improved with lymphedema pumps.  She reports bilateral heel chafing without sign of infection and bandaged today.  All other systems reviewed and are otherwise negative except as noted above.  Physical Exam    VS:  BP 132/88 (BP Location: Left Arm, Patient Position: Sitting, Cuff Size: Normal)   Pulse 97   Ht 5\' 2"  (1.575 m)   SpO2 94%   BMI 27.80 kg/m  , BMI Body mass index is 27.8 kg/m. GEN: Well nourished, well developed, in no acute distress.  Seated in wheelchair. HEENT: normal. Neck: Supple, no JVD, carotid bruits, or masses. Cardiac: RRR with extrasystole  appreciated, 2/6 systolic murmur murmurs, rubs, or gallops. No clubbing, bilateral cyanosis to mid calf, 3+ bilateral edema.  Radials/ 2+ and equal bilaterally. DP/PT confirmed by ultrasound/doppler and stronger in LLE than 1+ RLE.  Bilateral lower extremity coolness noted on exam today. Respiratory:  Respirations regular and unlabored, clear to auscultation bilaterally. GI: Soft, nontender, nondistended, BS + x 4. MS: no deformity or atrophy. Skin: warm and dry, no rash.  Bilateral cyanosis and coolness noted to mid calf as above. Neuro:  Strength and sensation are intact. Psych: Normal affect.  Accessory Clinical Findings    ECG personally reviewed by me today - NSR with significant artifact, RAD, RBBB, PVC, 97 bpm- no acute changes.  VITALS Reviewed today   Temp Readings from Last 3 Encounters:  11/10/19 (!) 97.4 F (36.3 C)  11/04/18 98.6 F (37 C) (Oral)  01/21/18 98.1 F (36.7 C) (Oral)   BP Readings from Last 3 Encounters:  05/10/20 132/88  02/06/20 (!) 154/105  01/20/20 134/80   Pulse Readings from Last 3 Encounters:  05/10/20 97  02/06/20 92  01/20/20 100    Wt Readings from Last 3 Encounters:  01/20/20 152 lb (68.9 kg)  11/10/19 152 lb (68.9 kg)  07/17/19 159 lb (72.1 kg)     LABS  reviewed today   Lab Results  Component Value Date   WBC 5.5 11/10/2019   HGB 13.8 11/10/2019   HCT 41.5 11/10/2019   MCV 95.8 11/10/2019   PLT 271.0 11/10/2019   Lab Results  Component Value Date   CREATININE 0.55 11/10/2019   BUN 23 11/10/2019   NA 139 11/10/2019   K 4.2 11/10/2019   CL 100 11/10/2019   CO2 31 11/10/2019   Lab Results  Component Value Date   ALT 12 11/10/2019   AST 18 11/10/2019   ALKPHOS 67 11/10/2019   BILITOT 0.3 11/10/2019   Lab Results  Component Value Date   CHOL 179 11/04/2018   HDL 77.70 11/04/2018   LDLCALC 85 11/04/2018   LDLDIRECT 140.8 06/15/2009   TRIG 84.0 11/04/2018   CHOLHDL 2 11/04/2018    Lab Results  Component Value  Date   HGBA1C 5.9 10/22/2017   Lab Results  Component Value Date   TSH 0.035 (L) 08/07/2017     STUDIES/PROCEDURES reviewed today   Echo 01/2019 1. The left ventricle  has normal systolic function with an ejection  fraction of 60-65%. The cavity size was normal. There is moderately  increased left ventricular wall thickness. Left ventricular diastolic  Doppler parameters are consistent with impaired  relaxation.Near cavity obliteration in systole. No significant LVOT  gradient measured.  2. The right ventricle has normal systolic function. The cavity was  normal. There is no increase in right ventricular wall thickness.  3. Left atrial size was mildly dilated.    Right Atrium: Right atrial size was normal in size. Right atrial pressure  is estimated at 10 mmHg.  Mitral Valve: The mitral valve is normal in structure. Mitral valve  regurgitation is mild by color flow Doppler.  Tricuspid Valve: The tricuspid valve is normal in structure. Tricuspid  valve regurgitation is mild by color flow Doppler.  Venous: The inferior vena cava measures 1.60 cm, is normal in size with  greater than 50% respiratory variability.   Assessment & Plan    Severe chronic venous insufficiency with stasis dermatitis/lymphedema --Reports improvement in venous insufficiency /edema/venous stasis ulcers and cyanosis/cool bilateral extremities since starting her lymphedema pumps.  On exam, she has 3+ pitting edema up to the level of her knee with previous note indicating 4+ bilateral edema.  She continues to have bilateral lower extremity cyanosis and coolness, though notes improvement. As above, previous 10/2018 arterial study without significant PAD.  Per review of primary cardiologist's most recent documentation, small vessel disease could be contributing. Bilateral pedal pulses confirmed today with Doppler today, though right pulse noted to be weaker than that of the left.  Discussed repeat arterial studies  with patient preference to defer given previous study results as above.  She feels that her cyanosis and coolness are improving and prefers to defer workup.  Her R BL malleolus venous stasis ulcer is healing slowly without signs of infection.  She does report problems with heel chafing, though bilateral heels bandaged today and difficult to assess.  She denies any signs or symptoms of infection of her bilateral heels.  Recommend continue to elevate heels, especially while sleeping, in order to reduce contact with other surfaces.  Suspect her chafing may be 2/2 increased sedentary lifestyle or time in bed.  Continue to recommend lymphedema pumps, elevation, activity as tolerated, and compression.  Reassess repeat arterial studies at RTC.  Risk factor modification recommended.  Dyspnea with sinus tachycardia --EKG continues to show heart rate in the 90s.  Echo 01/2019 as above no significant abnormalities.  Recommendation for repeat echo today, given murmur heard on exam.  Addendum: Echo deferred given patient preference as she reports the previous echo resulted in much discomfort due to her trouble with dressing/undressing.  Will update primary cardiologist.  Continue to attempt to escalate activity as tolerated.  Risk factor modification recommended.   New systolic murmur --New systolic murmur heard on exam without previous physical exams noting this murmur.  Discussed with patient and options for further work-up.  Agreed to obtain echocardiogram during visit.  Addendum: Due to reduced patient mobility, she prefers to defer an echo at this time.  Will update primary cardiologist.  HTN --BP borderline with goal BP <130/80. Not on any antihypertensives. Reassess at RTC.  Medication changes: None. Labs ordered: None. Studies / Imaging ordered: Echo (later canceled per patient request given it is difficult for her to dress and undress). Consider this and arterial studies at RTC. Disposition: Recommendation  to return to clinic in 3 months.  Patient has expressed a desire to return  at 73-month interval. RTC 6 months.    Lennon Alstrom, PA-C 05/10/2020

## 2020-06-01 ENCOUNTER — Other Ambulatory Visit: Payer: Medicare PPO

## 2020-06-29 DIAGNOSIS — H401132 Primary open-angle glaucoma, bilateral, moderate stage: Secondary | ICD-10-CM | POA: Diagnosis not present

## 2020-07-22 ENCOUNTER — Other Ambulatory Visit: Payer: Self-pay | Admitting: Internal Medicine

## 2020-07-23 NOTE — Telephone Encounter (Signed)
Last filled 02-11-20 #180 Last OV 11-10-19 Next OV 11-22-20 [facility]

## 2020-10-19 ENCOUNTER — Other Ambulatory Visit: Payer: Self-pay | Admitting: Internal Medicine

## 2020-10-19 NOTE — Telephone Encounter (Signed)
Last filled 07-23-20 #90 Last OV 11-10-19 Next OV 11-22-20 [facility]

## 2020-10-30 ENCOUNTER — Other Ambulatory Visit: Payer: Self-pay | Admitting: Internal Medicine

## 2020-11-02 ENCOUNTER — Ambulatory Visit: Payer: Medicare PPO | Admitting: Cardiovascular Disease

## 2020-11-02 ENCOUNTER — Other Ambulatory Visit: Payer: Self-pay

## 2020-11-02 ENCOUNTER — Encounter: Payer: Self-pay | Admitting: Cardiovascular Disease

## 2020-11-02 VITALS — BP 128/82 | HR 103 | Ht 62.0 in

## 2020-11-02 DIAGNOSIS — R0602 Shortness of breath: Secondary | ICD-10-CM | POA: Diagnosis not present

## 2020-11-02 DIAGNOSIS — I872 Venous insufficiency (chronic) (peripheral): Secondary | ICD-10-CM

## 2020-11-02 NOTE — Progress Notes (Signed)
Cardiology Office Note   Date:  11/02/2020   ID:  Alexandria Sherman, DOB 1946/07/14, MRN [id number]  PCP:  Karie Schwalbe, MD  Cardiologist:   Lorine Bears, MD   Chief Complaint  Patient presents with  . Follow-up    6 months  Pt states feet and legs are better; thinks she may have had COVID, felt like a cold but did not get tested. States this was 20-25 days ago. No other problems.      History of Present Illness: Alexandria Sherman is a 75 y.o. female who is here today for a follow-up visit regarding chronic venous insufficiency. She has known history of anxiety and depression, GERD, hypertension and thyroid problems. She has history of frequent falls over the last 2 years and she did have an injury in 2018 with subsequent bilateral leg weakness of unclear etiology.  She uses a wheeled walker for ambulation and reports no significant improvement with rehab.  She underwent vascular studies in February 2020.  Arterial Doppler showed normal ABI and toe pressure bilaterally with triphasic waveforms.  Venous Doppler was negative for DVT. She has chronic sinus tachycardia .  She is not a smoker and does not have history of diabetes. She had an echocardiogram done in May 2020 which showed normal LV systolic function with no significant valvular abnormalities. She has been treated with leg elevation, knee-high support stockings on most recently a lymphedema pump which helped tremendously.  She feels better with less heaviness in her legs and less edema.  Her mobility continues to be limited.   Past Medical History:  Diagnosis Date  . Anxiety disorder, unspecified   . Depression 1978  . Diverticulosis of colon 04/2005  . GERD (gastroesophageal reflux disease)   . Hypertension   . Hyperthyroidism    Dr Tedd Sias  . Impaired fasting glucose   . Low TSH level    chronic low, but euthyroid  . Microscopic hematuria   . RBBB   . Sleep disturbance   . Subclinical hyperthyroidism     unspecified  . Tibia fracture 05/2010   left    Past Surgical History:  Procedure Laterality Date  . ABDOMINAL HYSTERECTOMY  1988   hysterectomy/ BSO for endometriosis  . BREAST BIOPSY Right 1992   neg  . BREAST EXCISIONAL BIOPSY Right 1988   benign per pt  . KNEE ARTHROSCOPY  6/12   left--Dr Charlann Boxer     Current Outpatient Medications  Medication Sig Dispense Refill  . aspirin EC 81 MG tablet Take 81 mg by mouth 2 (two) times a week.     . calcium carbonate (OSCAL) 1500 (600 Ca) MG TABS tablet Take 600 mg of elemental calcium 2 (two) times daily by mouth.    . cholecalciferol (VITAMIN D) 1000 UNITS tablet Take 2,000 Units by mouth daily.    . clorazepate (TRANXENE) 3.75 MG tablet TAKE 1 TABLET BY MOUTH 3 TIMES DAILY AS NEEDED FOR ANXIETY. 90 tablet 0  . divalproex (DEPAKOTE) 125 MG DR tablet TAKE 1 TABLET BY MOUTH TWICE DAILY 60 tablet 11  . glycerin adult 2 g suppository Place 1 suppository as needed rectally for constipation.    Marland Kitchen ibuprofen (ADVIL,MOTRIN) 200 MG tablet Take 200 mg by mouth every 6 (six) hours as needed.    . latanoprost (XALATAN) 0.005 % ophthalmic solution Place 1 drop at bedtime into both eyes.     . meclizine (ANTIVERT) 25 MG tablet Take 25 mg 3 (three) times daily  as needed by mouth for dizziness.    . mometasone (ELOCON) 0.1 % cream Apply 1 application topically 2 (two) times daily as needed (itching). 45 g 1  . olopatadine (PATANOL) 0.1 % ophthalmic solution Place 1 drop 2 (two) times daily as needed into both eyes.     Marland Kitchen omeprazole (PRILOSEC) 20 MG capsule TAKE 1 CAPSULE BY MOUTH EVERY DAY 90 capsule 3  . timolol (TIMOPTIC) 0.5 % ophthalmic solution Place 1 drop daily into both eyes.    Marland Kitchen venlafaxine (EFFEXOR) 75 MG tablet TAKE 1 TABLET BY MOUTH TWICE DAILY 60 tablet 11  . vitamin B-12 (CYANOCOBALAMIN) 1000 MCG tablet Take 1,000 mcg by mouth daily.     No current facility-administered medications for this visit.    Allergies:   Cephalexin, Codeine  phosphate, and Promethazine hcl    Social History:  The patient  reports that she has never smoked. She has never used smokeless tobacco. She reports current alcohol use.   Family History:  The patient's family history includes Cancer in her mother, sister, and another family member; Dementia in an other family member; Depression in her mother, sister, and sister; Diabetes in her mother; Heart attack in an other family member; Stroke in an other family member.    ROS:  Please see the history of present illness.   Otherwise, review of systems are positive for none.   All other systems are reviewed and negative.    PHYSICAL EXAM: VS:  BP 128/82   Pulse (!) 103   Ht 5\' 2"  (1.575 m)   BMI 27.80 kg/m  , BMI Body mass index is 27.8 kg/m. GEN: Well nourished, well developed, in no acute distress  HEENT: normal  Neck: no JVD, carotid bruits, or masses Cardiac: RRR with tachycardia; 1 / 6 systolic murmur in the aortic area, rubs, or gallops. Respiratory:  clear to auscultation bilaterally, normal work of breathing GI: soft, nontender, nondistended, + BS MS: no deformity or atrophy  Skin: warm and dry, no rash Neuro:  Strength and sensation are intact Psych: euthymic mood, full affect Vascular: Dorsalis pedis is palpable bilaterally.  There is mild bilateral leg edema with significant dark discoloration all the way up to the knees.   EKG:  EKG is ordered today. The ekg ordered today demonstrates sinus tachycardia with right bundle branch block.   Recent Labs: 11/10/2019: ALT 12; BUN 23; Creatinine, Ser 0.55; Hemoglobin 13.8; Platelets 271.0; Potassium 4.2; Sodium 139    Lipid Panel    Component Value Date/Time   CHOL 179 11/04/2018 1233   TRIG 84.0 11/04/2018 1233   HDL 77.70 11/04/2018 1233   CHOLHDL 2 11/04/2018 1233   VLDL 16.8 11/04/2018 1233   LDLCALC 85 11/04/2018 1233   LDLDIRECT 140.8 06/15/2009 1045      Wt Readings from Last 3 Encounters:  01/20/20 152 lb (68.9 kg)   11/10/19 152 lb (68.9 kg)  07/17/19 159 lb (72.1 kg)       PAD Screen 11/12/2018  Previous PAD dx? Yes  Previous surgical procedure? No  Feet/toe relief with dangling? Yes  Painful, non-healing ulcers? No  Extremities discolored? Yes      ASSESSMENT AND PLAN:  1.  Severe chronic venous insufficiency with stasis dermatitis: With intermittent ulceration.  She has responded very well to lymphedema pump and is doing significantly better.    2.  Dyspnea with sinus tachycardia: Stable symptoms overall.  Likely due to physical deconditioning.  Echocardiogram last year showed normal LV systolic  function and no significant valvular abnormalities    Disposition:   FU with me in 12 months  Signed,  Lorine Bears, MD  11/02/2020 2:12 PM    [facility]

## 2020-11-02 NOTE — Patient Instructions (Signed)

## 2020-11-10 ENCOUNTER — Telehealth: Payer: Self-pay | Admitting: *Deleted

## 2020-11-10 NOTE — Telephone Encounter (Signed)
I have also discussed this with our front office supervisor to inquire what may be available for the pt.

## 2020-11-10 NOTE — Telephone Encounter (Signed)
Patient left a voicemail stating that she has an upcoming appointment scheduled with Dr. Alphonsus Sias on 11/22/20 at 2:00. Patient stated that she is requesting that a tall chrome stool be available for her  at that appointment. Patient stated the last time she came for an appointment she had to stand up and was exhausted after the appointment. Patient stated that she has been on crutches for a long time. Patient stated that she can not get up on the exam table and unable to sit in a chair because they are so short. Patient is hoping that her request can be taken care of. Discuss with [facility] CMA and was advised to forward this to management for their review.

## 2020-11-16 NOTE — Telephone Encounter (Signed)
Have we identified a safe support option for patient?

## 2020-11-19 NOTE — Telephone Encounter (Signed)
We could try it. It makes me a little nervous that it is on wheels. But, if it is all we have, that is all we can do.

## 2020-11-19 NOTE — Telephone Encounter (Signed)
Discussed support option and think Alexandria Sherman's chair she used at her desk may be an option.

## 2020-11-22 ENCOUNTER — Encounter: Payer: Self-pay | Admitting: Internal Medicine

## 2020-11-22 ENCOUNTER — Ambulatory Visit (INDEPENDENT_AMBULATORY_CARE_PROVIDER_SITE_OTHER): Payer: Medicare PPO | Admitting: Internal Medicine

## 2020-11-22 ENCOUNTER — Other Ambulatory Visit: Payer: Self-pay

## 2020-11-22 VITALS — BP 128/80 | HR 113 | Temp 97.5°F | Ht 62.0 in | Wt 142.0 lb

## 2020-11-22 DIAGNOSIS — F325 Major depressive disorder, single episode, in full remission: Secondary | ICD-10-CM | POA: Diagnosis not present

## 2020-11-22 DIAGNOSIS — Z1211 Encounter for screening for malignant neoplasm of colon: Secondary | ICD-10-CM | POA: Diagnosis not present

## 2020-11-22 DIAGNOSIS — I872 Venous insufficiency (chronic) (peripheral): Secondary | ICD-10-CM

## 2020-11-22 DIAGNOSIS — Z0000 Encounter for general adult medical examination without abnormal findings: Secondary | ICD-10-CM

## 2020-11-22 DIAGNOSIS — F39 Unspecified mood [affective] disorder: Secondary | ICD-10-CM

## 2020-11-22 DIAGNOSIS — R739 Hyperglycemia, unspecified: Secondary | ICD-10-CM | POA: Diagnosis not present

## 2020-11-22 DIAGNOSIS — Z7189 Other specified counseling: Secondary | ICD-10-CM | POA: Diagnosis not present

## 2020-11-22 DIAGNOSIS — I10 Essential (primary) hypertension: Secondary | ICD-10-CM

## 2020-11-22 MED ORDER — DIVALPROEX SODIUM 125 MG PO DR TAB: 125.0000 mg | DELAYED_RELEASE_TABLET | Freq: Two times a day (BID) | ORAL | 3 refills | Status: DC

## 2020-11-22 MED ORDER — VENLAFAXINE HCL 75 MG PO TABS: 75.0000 mg | ORAL_TABLET | Freq: Two times a day (BID) | ORAL | 3 refills | Status: DC

## 2020-11-22 NOTE — Assessment & Plan Note (Signed)
Much better with the lymphedema pump

## 2020-11-22 NOTE — Progress Notes (Signed)
Subjective:    Patient ID: Alexandria Sherman, female    DOB: March 28, 1946, 75 y.o.   MRN: [id number]  HPI Here for Medicare wellness and follow up of chronic health conditions This visit occurred during the SARS-CoV-2 public health emergency.  Safety protocols were in place, including screening questions prior to the visit, additional usage of staff PPE, and extensive cleaning of exam room while observing appropriate contact time as indicated for disinfecting solutions.   Reviewed form and advanced directives Reviewed other doctors No alcohol or tobacco Still with physical limitations---multiple falls without significant injury. Limited exercise ---tries to walk  (crutches inside and rollator outside) Does all her own ADLs still. --stands in shower with bars and chair Chair lift for stairs---two since the stairs are split Hairdresser once a week Vision is okay--Rx for glaucoma. Poor hearing--but doesn't want aides Some memory issues--mostly brief word recall  Does fine sitting Help with instrumental ADLs---friend does her shopping, someone puts out her garbage and weekly housekeeping. Still feels comfortable in her own home.  Now has lymphedema pump Edema not as bad now neuropathy  No apparent recurrence of depression Feels she needs to stay on the mood stabilizer Ongoing anxiety issues---especially at night. Uses the clonazepam nightly still  Takes prilosec nightly No heartburn on this. No dysphagia  No chest pain or SOB No dizziness or syncope---but thinks she had COVID (and she had some vertigo at first) Had slightly scratchy throat No palpitations  Current Outpatient Medications on File Prior to Visit  Medication Sig Dispense Refill  . aspirin EC 81 MG tablet Take 81 mg by mouth 2 (two) times a week.     . calcium carbonate (OSCAL) 1500 (600 Ca) MG TABS tablet Take 600 mg of elemental calcium 2 (two) times daily by mouth.    . cholecalciferol (VITAMIN D) 1000 UNITS  tablet Take 2,000 Units by mouth daily.    . clorazepate (TRANXENE) 3.75 MG tablet TAKE 1 TABLET BY MOUTH 3 TIMES DAILY AS NEEDED FOR ANXIETY. 90 tablet 0  . divalproex (DEPAKOTE) 125 MG DR tablet TAKE 1 TABLET BY MOUTH TWICE DAILY 60 tablet 11  . glycerin adult 2 g suppository Place 1 suppository as needed rectally for constipation.    Marland Kitchen ibuprofen (ADVIL,MOTRIN) 200 MG tablet Take 200 mg by mouth every 6 (six) hours as needed.    . latanoprost (XALATAN) 0.005 % ophthalmic solution Place 1 drop at bedtime into both eyes.     . meclizine (ANTIVERT) 25 MG tablet Take 25 mg 3 (three) times daily as needed by mouth for dizziness.    . mometasone (ELOCON) 0.1 % cream Apply 1 application topically 2 (two) times daily as needed (itching). 45 g 1  . olopatadine (PATANOL) 0.1 % ophthalmic solution Place 1 drop 2 (two) times daily as needed into both eyes.     Marland Kitchen omeprazole (PRILOSEC) 20 MG capsule TAKE 1 CAPSULE BY MOUTH EVERY DAY 90 capsule 3  . timolol (TIMOPTIC) 0.5 % ophthalmic solution Place 1 drop daily into both eyes.    Marland Kitchen venlafaxine (EFFEXOR) 75 MG tablet TAKE 1 TABLET BY MOUTH TWICE DAILY 60 tablet 11  . vitamin B-12 (CYANOCOBALAMIN) 1000 MCG tablet Take 1,000 mcg by mouth daily.    . [DISCONTINUED] Calcium Carbonate (CALCIUM 500 PO) Take one by mouth twice a day      No current facility-administered medications on file prior to visit.    Allergies  Allergen Reactions  . Cephalexin  REACTION: vaginal blisters Tolerates penicillins  . Codeine Phosphate     REACTION: vomiting  . Promethazine Hcl     REACTION: nerves    Past Medical History:  Diagnosis Date  . Anxiety disorder, unspecified   . Depression 1978  . Diverticulosis of colon 04/2005  . GERD (gastroesophageal reflux disease)   . Hypertension   . Hyperthyroidism    Dr Tedd Sias  . Impaired fasting glucose   . Low TSH level    chronic low, but euthyroid  . Microscopic hematuria   . RBBB   . Sleep disturbance   .  Subclinical hyperthyroidism    unspecified  . Tibia fracture 05/2010   left    Past Surgical History:  Procedure Laterality Date  . ABDOMINAL HYSTERECTOMY  1988   hysterectomy/ BSO for endometriosis  . BREAST BIOPSY Right 1992   neg  . BREAST EXCISIONAL BIOPSY Right 1988   benign per pt  . KNEE ARTHROSCOPY  6/12   left--Dr Charlann Boxer    Family History  Problem Relation Age of Onset  . Cancer Mother        lung  . Diabetes Mother   . Depression Mother   . Cancer Sister        oral cancer  . Depression Sister   . Depression Sister   . Stroke Other   . Heart attack Other   . Cancer Other   . Dementia Other   . Breast cancer Neg Hx   . Thyroid disease Neg Hx     Social History   Socioeconomic History  . Marital status: Divorced    Spouse name: Not on file  . Number of children: 0  . Years of education: 16  . Highest education level: Bachelor's degree (e.g., BA, AB, BS)  Occupational History  . Occupation: Teacher--retired  Tobacco Use  . Smoking status: Never Smoker  . Smokeless tobacco: Never Used  Vaping Use  . Vaping Use: Never used  Substance and Sexual Activity  . Alcohol use: Yes    Comment: Occasional  . Drug use: Not on file  . Sexual activity: Not on file  Other Topics Concern  . Not on file  Social History Narrative   Has living will   Both sisters have health care POA--- forms in chart   Now has DNR   No tube feeds if cognitively unaware   Social Determinants of Health   Financial Resource Strain: Not on file  Food Insecurity: Not on file  Transportation Needs: Not on file  Physical Activity: Not on file  Stress: Not on file  Social Connections: Not on file  Intimate Partner Violence: Not on file   Review of Systems Has some distal white discoloration in fingers at times--but no pain Appetite is good Weight down 10# since last year---she didn't realize Sleeps okay with clonazepam Wears seat belt Teeth are fine--keeps up with  dentist Bowels are fine---occasional constipation (uses miralax prn or suppository) Voids fine--but can't control it. Wears pads in day and pull up at night No sig back or joint pains    Objective:   Physical Exam Constitutional:      Appearance: Normal appearance.  HENT:     Mouth/Throat:     Comments: No lesions Eyes:     Conjunctiva/sclera: Conjunctivae normal.     Pupils: Pupils are equal, round, and reactive to light.  Cardiovascular:     Rate and Rhythm: Normal rate and regular rhythm.     Heart  sounds: No murmur heard. No gallop.      Comments: Faint pedal pulses Pulmonary:     Effort: Pulmonary effort is normal.     Breath sounds: Normal breath sounds. No wheezing or rales.  Abdominal:     Palpations: Abdomen is soft.     Tenderness: There is no abdominal tenderness.  Musculoskeletal:     Cervical back: Neck supple.     Comments: 1+ edema in feet/calves  Lymphadenopathy:     Cervical: No cervical adenopathy.  Skin:    Comments: Stasis changes in calves  Neurological:     Mental Status: She is alert and oriented to person, place, and time.     Comments: President--- "Verdie Drown, Garnet Koyanagi, Obama" [phone] [id number] Recall 3/3  Psychiatric:        Mood and Affect: Mood normal.        Behavior: Behavior normal.            Assessment & Plan:

## 2020-11-22 NOTE — Assessment & Plan Note (Signed)
Has DNR 

## 2020-11-22 NOTE — Assessment & Plan Note (Signed)
Will continue venlafaxine and depakote--this has worked well for her

## 2020-11-22 NOTE — Assessment & Plan Note (Signed)
Some anxiety and sleep issues Clonazepam helps for this

## 2020-11-22 NOTE — Assessment & Plan Note (Signed)
BP Readings from Last 3 Encounters:  11/22/20 128/80  11/02/20 128/82  05/10/20 132/88   Doing fine without Rx

## 2020-11-22 NOTE — Assessment & Plan Note (Addendum)
  I have personally reviewed the Medicare Annual Wellness questionnaire and have noted 1. The patient's medical and social history 2. Their use of alcohol, tobacco or illicit drugs 3. Their current medications and supplements 4. The patient's functional ability including ADL's, fall risks, home safety risks and hearing or visual             impairment. 5. Diet and physical activities 6. Evidence for depression or mood disorders  The patients weight, height, BMI and visual acuity have been recorded in the chart I have made referrals, counseling and provided education to the patient based review of the above and I have provided the pt with a written personalized care plan for preventive services.  I have provided you with a copy of your personalized plan for preventive services. Please take the time to review along with your updated medication list.  Has had COVID booster and flu vaccine Will do FIT again Mammogram every 2 years Tries to walk as much as she can

## 2020-11-22 NOTE — Progress Notes (Signed)
Hearing Screening   125Hz  250Hz  500Hz  1000Hz  2000Hz  3000Hz  4000Hz  6000Hz  8000Hz   Right ear:           Left ear:           Comments: Pt aware of hearing loss.  Vision Screening Comments: September 2021

## 2020-11-23 LAB — HEPATIC FUNCTION PANEL
ALT: 12 U/L (ref 0–35)
AST: 18 U/L (ref 0–37)
Albumin: 4.2 g/dL (ref 3.5–5.2)
Alkaline Phosphatase: 46 U/L (ref 39–117)
Bilirubin, Direct: 0.1 mg/dL (ref 0.0–0.3)
Total Bilirubin: 0.5 mg/dL (ref 0.2–1.2)
Total Protein: 7.2 g/dL (ref 6.0–8.3)

## 2020-11-23 LAB — RENAL FUNCTION PANEL
Albumin: 4.2 g/dL (ref 3.5–5.2)
BUN: 18 mg/dL (ref 6–23)
CO2: 30 mEq/L (ref 19–32)
Calcium: 11 mg/dL — ABNORMAL HIGH (ref 8.4–10.5)
Chloride: 100 mEq/L (ref 96–112)
Creatinine, Ser: 0.41 mg/dL (ref 0.40–1.20)
GFR: 95.8 mL/min (ref >60.00)
Glucose, Bld: 93 mg/dL (ref 70–99)
Phosphorus: 4 mg/dL (ref 2.3–4.6)
Potassium: 4 mEq/L (ref 3.5–5.1)
Sodium: 140 mEq/L (ref 135–145)

## 2020-11-23 LAB — VALPROIC ACID LEVEL: Valproic Acid Lvl: 30.5 mg/L — ABNORMAL LOW (ref 50.0–100.0)

## 2020-11-23 LAB — CBC
HCT: 42.8 % (ref 36.0–46.0)
Hemoglobin: 14.1 g/dL (ref 12.0–15.0)
MCHC: 32.9 g/dL (ref 30.0–36.0)
MCV: 95.7 fl (ref 78.0–100.0)
Platelets: 254 10*3/uL (ref 150.0–400.0)
RBC: 4.47 Mil/uL (ref 3.87–5.11)
RDW: 13.6 % (ref 11.5–15.5)
WBC: 6.6 10*3/uL (ref 4.0–10.5)

## 2020-11-23 LAB — HEMOGLOBIN A1C: Hgb A1c MFr Bld: 5.7 % (ref 4.6–6.5)

## 2020-11-25 ENCOUNTER — Other Ambulatory Visit (INDEPENDENT_AMBULATORY_CARE_PROVIDER_SITE_OTHER): Payer: Medicare PPO

## 2020-11-25 DIAGNOSIS — Z1211 Encounter for screening for malignant neoplasm of colon: Secondary | ICD-10-CM | POA: Diagnosis not present

## 2020-11-25 LAB — FECAL OCCULT BLOOD, IMMUNOCHEMICAL: Fecal Occult Bld: NEGATIVE

## 2021-01-04 DIAGNOSIS — H401132 Primary open-angle glaucoma, bilateral, moderate stage: Secondary | ICD-10-CM | POA: Diagnosis not present

## 2021-01-04 DIAGNOSIS — Z0100 Encounter for examination of eyes and vision without abnormal findings: Secondary | ICD-10-CM | POA: Diagnosis not present

## 2021-01-27 ENCOUNTER — Other Ambulatory Visit: Payer: Self-pay | Admitting: Internal Medicine

## 2021-01-27 NOTE — Telephone Encounter (Signed)
Last filled 10-19-20 Last OV 11-22-20 Next OV 11-29-23 [facility]

## 2021-03-02 ENCOUNTER — Other Ambulatory Visit: Payer: Self-pay | Admitting: Internal Medicine

## 2021-03-02 NOTE — Telephone Encounter (Signed)
Last filled 01-27-21 #90 Last OV 11-22-20 Next OV 11-28-21 [facility]

## 2021-05-16 ENCOUNTER — Other Ambulatory Visit: Payer: Self-pay | Admitting: Internal Medicine

## 2021-05-16 DIAGNOSIS — Z1231 Encounter for screening mammogram for malignant neoplasm of breast: Secondary | ICD-10-CM

## 2021-05-27 ENCOUNTER — Other Ambulatory Visit: Payer: Self-pay

## 2021-05-27 ENCOUNTER — Ambulatory Visit
Admission: RE | Admit: 2021-05-27 | Discharge: 2021-05-27 | Disposition: A | Discharge status: Home / Self Care | Payer: Medicare PPO | Source: Ambulatory Visit | Attending: Internal Medicine | Admitting: Internal Medicine

## 2021-05-27 DIAGNOSIS — Z1231 Encounter for screening mammogram for malignant neoplasm of breast: Secondary | ICD-10-CM | POA: Insufficient documentation

## 2021-06-03 ENCOUNTER — Other Ambulatory Visit: Payer: Self-pay | Admitting: Family

## 2021-06-06 NOTE — Telephone Encounter (Signed)
Last filled 03-02-21 #90 Last OV 11-22-20 Next OV 11-28-21 [facility]

## 2021-06-06 NOTE — Telephone Encounter (Signed)
Pt called to check status on this. She states she is completely out and needs this filled as soon as possible.

## 2021-07-05 ENCOUNTER — Telehealth: Payer: Self-pay | Admitting: Internal Medicine

## 2021-07-05 NOTE — Telephone Encounter (Signed)
Pt called to reschedule March appointment. She has requested a call back from [employer]. She states she needs to discuss seating accommodations that need to be made for this appointment.

## 2021-07-08 NOTE — Telephone Encounter (Signed)
Left message on VM advising pt to call us closer to her appt like Feb or March. We are out of the building due to renovations. I am not sure what our options will be once we get back in the building. So it will be best to get back in the building at [facility] and evaluate what we we can do to help her with elevated seating.

## 2021-08-10 DIAGNOSIS — H401132 Primary open-angle glaucoma, bilateral, moderate stage: Secondary | ICD-10-CM | POA: Diagnosis not present

## 2021-08-25 ENCOUNTER — Other Ambulatory Visit: Payer: Self-pay | Admitting: Internal Medicine

## 2021-08-25 NOTE — Telephone Encounter (Signed)
Last filled 06-06-21 #90 Last OV 11-22-20 Next OV 12-06-21 [facility]

## 2021-11-02 ENCOUNTER — Telehealth: Payer: Self-pay

## 2021-11-02 NOTE — Telephone Encounter (Signed)
[facility] Night - Client Nonclinical Telephone Record  [facility] Night - Client Client Site [facility] [place] - Night Provider Tillman Abide- MD Contact Type Call Who Is Calling Patient / Member / Family / Caregiver Caller Name [name] Caller Phone Number [phone] Patient Name Alexandria Sherman Patient DOB 11-17-1945 Call Type Message Only Information Provided Reason for Call Request for General Office Information Initial Comment Caller states she would like to know when her appt is. Additional Comment Provided office hours. Disp. Time Disposition Final User 11/02/2021 10:06:03 AM General Information Provided Yes Mellody Dance Call Closed By: Mellody Dance Transaction Date/Time: 11/02/2021 10:02:43 AM (ET

## 2021-11-03 NOTE — Telephone Encounter (Signed)
Called pt and went over appt info °

## 2021-11-18 ENCOUNTER — Other Ambulatory Visit: Payer: Self-pay | Admitting: Internal Medicine

## 2021-11-18 NOTE — Telephone Encounter (Signed)
Last filled 08-25-21 #90 Last OV 11-22-20 Next OV 12-06-21 [facility]

## 2021-11-23 ENCOUNTER — Encounter: Payer: Medicare PPO | Admitting: Internal Medicine

## 2021-11-28 ENCOUNTER — Encounter: Payer: Medicare PPO | Admitting: Internal Medicine

## 2021-11-28 ENCOUNTER — Telehealth: Payer: Self-pay

## 2021-11-28 NOTE — Telephone Encounter (Signed)
Spoke to patient by telephone and was advised that she has an appointment scheduled with Dr. Alphonsus Sias 12/06/21. Patient stated that she wants to make sure that [facility] has the tall stool ready for her to use when she comes in for that appointment like she did last year.  ?

## 2021-11-28 NOTE — Telephone Encounter (Signed)
Patient is calling need to speak with [facility]. No message left.

## 2021-11-29 NOTE — Telephone Encounter (Signed)
Shannon advised

## 2021-12-06 ENCOUNTER — Ambulatory Visit (INDEPENDENT_AMBULATORY_CARE_PROVIDER_SITE_OTHER): Payer: Medicare PPO | Admitting: Internal Medicine

## 2021-12-06 ENCOUNTER — Other Ambulatory Visit: Payer: Self-pay

## 2021-12-06 ENCOUNTER — Encounter: Payer: Self-pay | Admitting: Internal Medicine

## 2021-12-06 VITALS — BP 128/80 | HR 98 | Temp 97.6°F | Ht 64.0 in | Wt 133.0 lb

## 2021-12-06 DIAGNOSIS — F325 Major depressive disorder, single episode, in full remission: Secondary | ICD-10-CM | POA: Diagnosis not present

## 2021-12-06 DIAGNOSIS — R531 Weakness: Secondary | ICD-10-CM | POA: Insufficient documentation

## 2021-12-06 DIAGNOSIS — I872 Venous insufficiency (chronic) (peripheral): Secondary | ICD-10-CM | POA: Diagnosis not present

## 2021-12-06 DIAGNOSIS — G629 Polyneuropathy, unspecified: Secondary | ICD-10-CM

## 2021-12-06 DIAGNOSIS — K2100 Gastro-esophageal reflux disease with esophagitis, without bleeding: Secondary | ICD-10-CM

## 2021-12-06 DIAGNOSIS — Z7189 Other specified counseling: Secondary | ICD-10-CM

## 2021-12-06 DIAGNOSIS — R5381 Other malaise: Secondary | ICD-10-CM

## 2021-12-06 DIAGNOSIS — Z0000 Encounter for general adult medical examination without abnormal findings: Secondary | ICD-10-CM | POA: Diagnosis not present

## 2021-12-06 LAB — COMPREHENSIVE METABOLIC PANEL
ALT: 12 U/L (ref 0–35)
AST: 17 U/L (ref 0–37)
Albumin: 4.4 g/dL (ref 3.5–5.2)
Alkaline Phosphatase: 55 U/L (ref 39–117)
BUN: 26 mg/dL — ABNORMAL HIGH (ref 6–23)
CO2: 28 mEq/L (ref 19–32)
Calcium: 10.6 mg/dL — ABNORMAL HIGH (ref 8.4–10.5)
Chloride: 101 mEq/L (ref 96–112)
Creatinine, Ser: 0.41 mg/dL (ref 0.40–1.20)
GFR: 95.1 mL/min (ref >60.00)
Glucose, Bld: 108 mg/dL — ABNORMAL HIGH (ref 70–99)
Potassium: 4.1 mEq/L (ref 3.5–5.1)
Sodium: 138 mEq/L (ref 135–145)
Total Bilirubin: 0.4 mg/dL (ref 0.2–1.2)
Total Protein: 7.4 g/dL (ref 6.0–8.3)

## 2021-12-06 LAB — SEDIMENTATION RATE: Sed Rate: 36 mm/hr — ABNORMAL HIGH (ref 0–30)

## 2021-12-06 LAB — CBC
HCT: 42.1 % (ref 36.0–46.0)
Hemoglobin: 13.9 g/dL (ref 12.0–15.0)
MCHC: 32.9 g/dL (ref 30.0–36.0)
MCV: 95.9 fl (ref 78.0–100.0)
Platelets: 254 10*3/uL (ref 150.0–400.0)
RBC: 4.39 Mil/uL (ref 3.87–5.11)
RDW: 13.3 % (ref 11.5–15.5)
WBC: 7.3 10*3/uL (ref 4.0–10.5)

## 2021-12-06 LAB — T4, FREE: Free T4: 0.86 ng/dL (ref 0.60–1.60)

## 2021-12-06 NOTE — Assessment & Plan Note (Signed)
Does okay with pump periodically ?

## 2021-12-06 NOTE — Progress Notes (Signed)
Hearing Screening - Comments:: Aware of hearing loss.  ?Vision Screening - Comments:: October 2022 ? ?

## 2021-12-06 NOTE — Assessment & Plan Note (Signed)
Unclear diagnosis ?Not clear if she could have spinomuscular disease--seems like it ?

## 2021-12-06 NOTE — Progress Notes (Addendum)
? ?Subjective:  ? ? Patient ID: Alexandria ScheuermannDonna S Sherman, female    DOB: 1945-12-20, 76 y.o.   MRN: [id number] ? ?HPI ?Here for Medicare wellness visit and follow up of chronic health conditions ?Reviewed form and advanced directives ?Reviewed other doctors ?No hospitalizations or surgery in past year ?No alcohol or tobacco ?Vision is okay--some cataracts/glaucoma ?Hearing not great ?Falls a lot--but no injuries (occ needs help from EMS to get up) ?Chronic mood issues ?Ongoing memory issues---mostly names or word recall ? ?"I'm holding on" ?Still in home---has housecleaning once a week. She runs errands for her ?Neighbor does garbage ?Girlfriend shops for her ?Simple cooking---washes dishes, does laundry other than sheets ?Remains independent with ADLs--can shower in walk in unit ? ?Doing well overall ?Ongoing stable muscle weakness and balance problems ? ?Using the compression sleeves---pump ?Helps with the chronic edema ?Ongoing neuropathy in feet---"like frostbite) ?Itching/tingling better with elevation ? ?Still has some depression---"life is not great" ?But not anhedonic ?Has friends and family ? ?Uses omeprazole daily ?No heartburn or dysphagia on this (just has rare choking if something goes down wrong) ? ?Current Outpatient Medications on File Prior to Visit  ?Medication Sig Dispense Refill  ? aspirin EC 81 MG tablet Take 81 mg by mouth every other day.    ? calcium carbonate (OSCAL) 1500 (600 Ca) MG TABS tablet Take 600 mg of elemental calcium 2 (two) times daily by mouth.    ? cholecalciferol (VITAMIN D) 1000 UNITS tablet Take 2,000 Units by mouth daily.    ? clorazepate (TRANXENE) 3.75 MG tablet TAKE 1 TABLET BY MOUTH 3 TIMES DAILY AS NEEDED FOR ANXIETY. 90 tablet 0  ? divalproex (DEPAKOTE) 125 MG DR tablet TAKE 1 TABLET BY MOUTH TWICE DAILY 180 tablet 3  ? ibuprofen (ADVIL,MOTRIN) 200 MG tablet Take 200 mg by mouth every 6 (six) hours as needed.    ? latanoprost (XALATAN) 0.005 % ophthalmic solution Place 1 drop  at bedtime into both eyes.     ? meclizine (ANTIVERT) 25 MG tablet Take 25 mg 3 (three) times daily as needed by mouth for dizziness.    ? mometasone (ELOCON) 0.1 % cream Apply 1 application topically 2 (two) times daily as needed (itching). 45 g 1  ? olopatadine (PATANOL) 0.1 % ophthalmic solution Place 1 drop 2 (two) times daily as needed into both eyes.     ? omeprazole (PRILOSEC) 20 MG capsule TAKE 1 CAPSULE BY MOUTH ONCE DAILY 90 capsule 3  ? timolol (TIMOPTIC) 0.5 % ophthalmic solution Place 1 drop daily into both eyes.    ? venlafaxine (EFFEXOR) 75 MG tablet TAKE 1 TABLET BY MOUTH TWICE DAILY 180 tablet 3  ? vitamin B-12 (CYANOCOBALAMIN) 1000 MCG tablet Take 1,000 mcg by mouth daily.    ? [DISCONTINUED] Calcium Carbonate (CALCIUM 500 PO) Take one by mouth twice a day     ? ?No current facility-administered medications on file prior to visit.  ? ? ?Allergies  ?Allergen Reactions  ? Cephalexin   ?  REACTION: vaginal blisters ?Tolerates penicillins  ? Codeine Phosphate   ?  REACTION: vomiting  ? Promethazine Hcl   ?  REACTION: nerves  ? ? ?Past Medical History:  ?Diagnosis Date  ? Anxiety disorder, unspecified   ? Depression 1978  ? Diverticulosis of colon 04/2005  ? GERD (gastroesophageal reflux disease)   ? Hypertension   ? Hyperthyroidism   ? Dr Tedd SiasSolum  ? Impaired fasting glucose   ? Low TSH level   ?  chronic low, but euthyroid  ? Microscopic hematuria   ? RBBB   ? Sleep disturbance   ? Subclinical hyperthyroidism   ? unspecified  ? Tibia fracture 05/2010  ? left  ? ? ?Past Surgical History:  ?Procedure Laterality Date  ? ABDOMINAL HYSTERECTOMY  1988  ? hysterectomy/ BSO for endometriosis  ? BREAST BIOPSY Right 1992  ? neg  ? BREAST EXCISIONAL BIOPSY Right 1988  ? benign per pt  ? KNEE ARTHROSCOPY  6/12  ? left--Dr Charlann Boxer  ? ? ?Family History  ?Problem Relation Age of Onset  ? Cancer Mother   ?     lung  ? Diabetes Mother   ? Depression Mother   ? Cancer Sister   ?     oral cancer  ? Depression Sister   ?  Depression Sister   ? Stroke Other   ? Heart attack Other   ? Cancer Other   ? Dementia Other   ? Breast cancer Neg Hx   ? Thyroid disease Neg Hx   ? ? ?Social History  ? ?Socioeconomic History  ? Marital status: Divorced  ?  Spouse name: Not on file  ? Number of children: 0  ? Years of education: 76  ? Highest education level: Bachelor's degree (e.g., BA, AB, BS)  ?Occupational History  ? Occupation: Teacher--retired  ?Tobacco Use  ? Smoking status: Never  ?  Passive exposure: Past  ? Smokeless tobacco: Never  ?Vaping Use  ? Vaping Use: Never used  ?Substance and Sexual Activity  ? Alcohol use: Yes  ?  Comment: Occasional  ? Drug use: Not on file  ? Sexual activity: Not on file  ?Other Topics Concern  ? Not on file  ?Social History Narrative  ? Has living will  ? Both sisters have health care POA--- forms in chart  ? Now has DNR  ? No tube feeds if cognitively unaware  ? ?Social Determinants of Health  ? ?Financial Resource Strain: Not on file  ?Food Insecurity: Not on file  ?Transportation Needs: Not on file  ?Physical Activity: Not on file  ?Stress: Not on file  ?Social Connections: Not on file  ?Intimate Partner Violence: Not on file  ? ?Review of Systems ?Appetite is good ?Weight down 8#--she is surprised about this ?Sleeps well with the chlorazepate ?Teeth are fine ?Wears seat belt ?No chest pain or SOB ?Occasional vertigo--meclizine helps within 2-3 days ?Gets back soreness---not persistent ?Joints "are not reliable"----not really painful though ?Has a number of scaly areas---plans to set up with dermatologist ?   ?Objective:  ? Physical Exam ?Constitutional:   ?   Appearance: Normal appearance.  ?HENT:  ?   Mouth/Throat:  ?   Comments: No lesions ?Eyes:  ?   Conjunctiva/sclera: Conjunctivae normal.  ?   Pupils: Pupils are equal, round, and reactive to light.  ?Cardiovascular:  ?   Rate and Rhythm: Normal rate and regular rhythm.  ?   Pulses: Normal pulses.  ?   Heart sounds: No murmur heard. ?  No gallop.   ?Pulmonary:  ?   Effort: Pulmonary effort is normal.  ?   Breath sounds: Normal breath sounds. No wheezing or rales.  ?Abdominal:  ?   Palpations: Abdomen is soft.  ?   Tenderness: There is no abdominal tenderness.  ?Musculoskeletal:  ?   Cervical back: Neck supple.  ?   Comments: 1+ edema in calves---no pitting  ?Lymphadenopathy:  ?   Cervical: No cervical adenopathy.  ?  Skin: ?   Findings: No lesion or rash.  ?Neurological:  ?   Mental Status: She is alert and oriented to person, place, and time.  ?   Comments: Mini-cog normal ?Weakness/coordination problems in all extremities  ?Psychiatric:     ?   Mood and Affect: Mood normal.     ?   Behavior: Behavior normal.  ?  ? ? ? ? ?   ?Assessment & Plan:  ? ?

## 2021-12-06 NOTE — Assessment & Plan Note (Signed)
Has DNR 

## 2021-12-06 NOTE — Assessment & Plan Note (Signed)
Controlled with omeprazole 20mg daily 

## 2021-12-06 NOTE — Assessment & Plan Note (Signed)
Mild symptoms still ?On depakote 250 bid and venlafaxine 75 mg daily ?Will check labs ?

## 2021-12-06 NOTE — Assessment & Plan Note (Signed)
Ongoing weakness and coordination issues ?She lives alone with help and is stable ?

## 2021-12-06 NOTE — Assessment & Plan Note (Signed)
I have personally reviewed the Medicare Annual Wellness questionnaire and have noted ?1. The patient's medical and social history ?2. Their use of alcohol, tobacco or illicit drugs ?3. Their current medications and supplements ?4. The patient's functional ability including ADL's, fall risks, home safety risks and hearing or visual ?            impairment. ?5. Diet and physical activities ?6. Evidence for depression or mood disorders ? ?The patients weight, height, BMI and visual acuity have been recorded in the chart ?I have made referrals, counseling and provided education to the patient based review of the above and I have provided the pt with a written personalized care plan for preventive services. ? ?I have provided you with a copy of your personalized plan for preventive services. Please take the time to review along with your updated medication list. ? ?Discussed cancer screening---will stop now ?Flu vaccine in the fall ?Not really able to exercise ?Consider COVID booster again ?

## 2021-12-07 LAB — VALPROIC ACID LEVEL: Valproic Acid Lvl: 33.1 mg/L — ABNORMAL LOW (ref 50.0–100.0)

## 2022-01-14 ENCOUNTER — Emergency Department: Payer: Medicare PPO

## 2022-01-14 ENCOUNTER — Inpatient Hospital Stay
Admission: EM | Admit: 2022-01-14 | Discharge: 2022-01-18 | DRG: 481 | Disposition: A | Discharge status: Skilled Nursing Facility | Payer: Medicare PPO | Attending: Osteopathic Medicine | Admitting: Osteopathic Medicine

## 2022-01-14 DIAGNOSIS — Z880 Allergy status to penicillin: Secondary | ICD-10-CM | POA: Diagnosis not present

## 2022-01-14 DIAGNOSIS — F39 Unspecified mood [affective] disorder: Secondary | ICD-10-CM | POA: Diagnosis not present

## 2022-01-14 DIAGNOSIS — S0990XA Unspecified injury of head, initial encounter: Secondary | ICD-10-CM | POA: Diagnosis not present

## 2022-01-14 DIAGNOSIS — F419 Anxiety disorder, unspecified: Secondary | ICD-10-CM | POA: Diagnosis present

## 2022-01-14 DIAGNOSIS — Y92009 Unspecified place in unspecified non-institutional (private) residence as the place of occurrence of the external cause: Secondary | ICD-10-CM

## 2022-01-14 DIAGNOSIS — Z833 Family history of diabetes mellitus: Secondary | ICD-10-CM

## 2022-01-14 DIAGNOSIS — F411 Generalized anxiety disorder: Secondary | ICD-10-CM | POA: Diagnosis not present

## 2022-01-14 DIAGNOSIS — Z7982 Long term (current) use of aspirin: Secondary | ICD-10-CM

## 2022-01-14 DIAGNOSIS — Z043 Encounter for examination and observation following other accident: Secondary | ICD-10-CM | POA: Diagnosis not present

## 2022-01-14 DIAGNOSIS — I5032 Chronic diastolic (congestive) heart failure: Secondary | ICD-10-CM | POA: Diagnosis present

## 2022-01-14 DIAGNOSIS — Z885 Allergy status to narcotic agent status: Secondary | ICD-10-CM | POA: Diagnosis not present

## 2022-01-14 DIAGNOSIS — Z888 Allergy status to other drugs, medicaments and biological substances status: Secondary | ICD-10-CM

## 2022-01-14 DIAGNOSIS — F325 Major depressive disorder, single episode, in full remission: Secondary | ICD-10-CM | POA: Diagnosis present

## 2022-01-14 DIAGNOSIS — S72142A Displaced intertrochanteric fracture of left femur, initial encounter for closed fracture: Secondary | ICD-10-CM | POA: Diagnosis present

## 2022-01-14 DIAGNOSIS — D62 Acute posthemorrhagic anemia: Secondary | ICD-10-CM | POA: Diagnosis not present

## 2022-01-14 DIAGNOSIS — N179 Acute kidney failure, unspecified: Secondary | ICD-10-CM | POA: Diagnosis not present

## 2022-01-14 DIAGNOSIS — G479 Sleep disorder, unspecified: Secondary | ICD-10-CM | POA: Diagnosis present

## 2022-01-14 DIAGNOSIS — R42 Dizziness and giddiness: Secondary | ICD-10-CM | POA: Diagnosis not present

## 2022-01-14 DIAGNOSIS — F32A Depression, unspecified: Secondary | ICD-10-CM | POA: Diagnosis present

## 2022-01-14 DIAGNOSIS — W19XXXA Unspecified fall, initial encounter: Secondary | ICD-10-CM | POA: Diagnosis present

## 2022-01-14 DIAGNOSIS — Z823 Family history of stroke: Secondary | ICD-10-CM

## 2022-01-14 DIAGNOSIS — I110 Hypertensive heart disease with heart failure: Secondary | ICD-10-CM | POA: Diagnosis present

## 2022-01-14 DIAGNOSIS — W19XXXD Unspecified fall, subsequent encounter: Secondary | ICD-10-CM | POA: Diagnosis not present

## 2022-01-14 DIAGNOSIS — K219 Gastro-esophageal reflux disease without esophagitis: Secondary | ICD-10-CM | POA: Diagnosis present

## 2022-01-14 DIAGNOSIS — Z79899 Other long term (current) drug therapy: Secondary | ICD-10-CM

## 2022-01-14 DIAGNOSIS — Z66 Do not resuscitate: Secondary | ICD-10-CM | POA: Diagnosis present

## 2022-01-14 DIAGNOSIS — I6782 Cerebral ischemia: Secondary | ICD-10-CM | POA: Diagnosis not present

## 2022-01-14 DIAGNOSIS — Z8249 Family history of ischemic heart disease and other diseases of the circulatory system: Secondary | ICD-10-CM

## 2022-01-14 DIAGNOSIS — Z7401 Bed confinement status: Secondary | ICD-10-CM | POA: Diagnosis not present

## 2022-01-14 DIAGNOSIS — I10 Essential (primary) hypertension: Secondary | ICD-10-CM | POA: Diagnosis not present

## 2022-01-14 DIAGNOSIS — E860 Dehydration: Secondary | ICD-10-CM | POA: Diagnosis not present

## 2022-01-14 DIAGNOSIS — G319 Degenerative disease of nervous system, unspecified: Secondary | ICD-10-CM | POA: Diagnosis not present

## 2022-01-14 DIAGNOSIS — F0390 Unspecified dementia without behavioral disturbance: Secondary | ICD-10-CM | POA: Diagnosis not present

## 2022-01-14 DIAGNOSIS — S72002A Fracture of unspecified part of neck of left femur, initial encounter for closed fracture: Secondary | ICD-10-CM

## 2022-01-14 DIAGNOSIS — I872 Venous insufficiency (chronic) (peripheral): Secondary | ICD-10-CM | POA: Diagnosis present

## 2022-01-14 DIAGNOSIS — S72142D Displaced intertrochanteric fracture of left femur, subsequent encounter for closed fracture with routine healing: Secondary | ICD-10-CM | POA: Diagnosis not present

## 2022-01-14 DIAGNOSIS — R001 Bradycardia, unspecified: Secondary | ICD-10-CM | POA: Diagnosis not present

## 2022-01-14 DIAGNOSIS — W010XXA Fall on same level from slipping, tripping and stumbling without subsequent striking against object, initial encounter: Secondary | ICD-10-CM | POA: Diagnosis present

## 2022-01-14 DIAGNOSIS — R609 Edema, unspecified: Secondary | ICD-10-CM | POA: Diagnosis not present

## 2022-01-14 DIAGNOSIS — R5381 Other malaise: Secondary | ICD-10-CM | POA: Diagnosis not present

## 2022-01-14 DIAGNOSIS — R0689 Other abnormalities of breathing: Secondary | ICD-10-CM | POA: Diagnosis not present

## 2022-01-14 NOTE — ED Triage Notes (Signed)
Mechanical fall unwitnessed. Pt fell onto L hip no LOC, no pmh thinners. EMS stating shortening and distortion in LLE. Arrived with 20g LAC. fentanyl enroute and 4 mg zofran. 204 cbg. 181/107 enroute. 94RA and 92HR ?

## 2022-01-15 ENCOUNTER — Inpatient Hospital Stay: Payer: Medicare PPO

## 2022-01-15 ENCOUNTER — Encounter
Admission: EM | Disposition: A | Discharge status: Skilled Nursing Facility | Payer: Self-pay | Source: Home / Self Care | Attending: Internal Medicine

## 2022-01-15 ENCOUNTER — Inpatient Hospital Stay: Payer: Medicare PPO | Admitting: Anesthesiology

## 2022-01-15 ENCOUNTER — Other Ambulatory Visit: Payer: Self-pay

## 2022-01-15 ENCOUNTER — Encounter: Payer: Self-pay | Admitting: Internal Medicine

## 2022-01-15 DIAGNOSIS — F419 Anxiety disorder, unspecified: Secondary | ICD-10-CM | POA: Diagnosis present

## 2022-01-15 DIAGNOSIS — Z66 Do not resuscitate: Secondary | ICD-10-CM | POA: Diagnosis present

## 2022-01-15 DIAGNOSIS — Z885 Allergy status to narcotic agent status: Secondary | ICD-10-CM | POA: Diagnosis not present

## 2022-01-15 DIAGNOSIS — Z888 Allergy status to other drugs, medicaments and biological substances status: Secondary | ICD-10-CM | POA: Diagnosis not present

## 2022-01-15 DIAGNOSIS — Y92009 Unspecified place in unspecified non-institutional (private) residence as the place of occurrence of the external cause: Secondary | ICD-10-CM | POA: Diagnosis not present

## 2022-01-15 DIAGNOSIS — N179 Acute kidney failure, unspecified: Secondary | ICD-10-CM | POA: Diagnosis not present

## 2022-01-15 DIAGNOSIS — Z7982 Long term (current) use of aspirin: Secondary | ICD-10-CM | POA: Diagnosis not present

## 2022-01-15 DIAGNOSIS — Z833 Family history of diabetes mellitus: Secondary | ICD-10-CM | POA: Diagnosis not present

## 2022-01-15 DIAGNOSIS — S72002A Fracture of unspecified part of neck of left femur, initial encounter for closed fracture: Secondary | ICD-10-CM | POA: Diagnosis present

## 2022-01-15 DIAGNOSIS — S72142A Displaced intertrochanteric fracture of left femur, initial encounter for closed fracture: Secondary | ICD-10-CM | POA: Diagnosis present

## 2022-01-15 DIAGNOSIS — D62 Acute posthemorrhagic anemia: Secondary | ICD-10-CM | POA: Diagnosis not present

## 2022-01-15 DIAGNOSIS — Z823 Family history of stroke: Secondary | ICD-10-CM | POA: Diagnosis not present

## 2022-01-15 DIAGNOSIS — I110 Hypertensive heart disease with heart failure: Secondary | ICD-10-CM | POA: Diagnosis present

## 2022-01-15 DIAGNOSIS — I5032 Chronic diastolic (congestive) heart failure: Secondary | ICD-10-CM | POA: Diagnosis present

## 2022-01-15 DIAGNOSIS — G479 Sleep disorder, unspecified: Secondary | ICD-10-CM | POA: Diagnosis present

## 2022-01-15 DIAGNOSIS — K219 Gastro-esophageal reflux disease without esophagitis: Secondary | ICD-10-CM | POA: Diagnosis present

## 2022-01-15 DIAGNOSIS — F325 Major depressive disorder, single episode, in full remission: Secondary | ICD-10-CM | POA: Diagnosis present

## 2022-01-15 DIAGNOSIS — E860 Dehydration: Secondary | ICD-10-CM | POA: Diagnosis not present

## 2022-01-15 DIAGNOSIS — W19XXXA Unspecified fall, initial encounter: Secondary | ICD-10-CM | POA: Diagnosis not present

## 2022-01-15 DIAGNOSIS — Z8249 Family history of ischemic heart disease and other diseases of the circulatory system: Secondary | ICD-10-CM | POA: Diagnosis not present

## 2022-01-15 DIAGNOSIS — I872 Venous insufficiency (chronic) (peripheral): Secondary | ICD-10-CM | POA: Diagnosis present

## 2022-01-15 DIAGNOSIS — Z79899 Other long term (current) drug therapy: Secondary | ICD-10-CM | POA: Diagnosis not present

## 2022-01-15 DIAGNOSIS — W010XXA Fall on same level from slipping, tripping and stumbling without subsequent striking against object, initial encounter: Secondary | ICD-10-CM | POA: Diagnosis present

## 2022-01-15 DIAGNOSIS — Z880 Allergy status to penicillin: Secondary | ICD-10-CM | POA: Diagnosis not present

## 2022-01-15 DIAGNOSIS — F32A Depression, unspecified: Secondary | ICD-10-CM | POA: Diagnosis present

## 2022-01-15 HISTORY — PX: INTRAMEDULLARY (IM) NAIL INTERTROCHANTERIC: SHX5875

## 2022-01-15 LAB — BASIC METABOLIC PANEL
Anion gap: 6 (ref 5–15)
BUN: 26 mg/dL — ABNORMAL HIGH (ref 8–23)
CO2: 30 mmol/L (ref 22–32)
Calcium: 9.2 mg/dL (ref 8.9–10.3)
Chloride: 103 mmol/L (ref 98–111)
Creatinine, Ser: 0.46 mg/dL (ref 0.44–1.00)
GFR, Estimated: >60 mL/min (ref >60)
Glucose, Bld: 132 mg/dL — ABNORMAL HIGH (ref 70–99)
Potassium: 3.9 mmol/L (ref 3.5–5.1)
Sodium: 139 mmol/L (ref 135–145)

## 2022-01-15 LAB — CBC WITH DIFFERENTIAL/PLATELET
Abs Immature Granulocytes: 0.04 10*3/uL (ref 0.00–0.07)
Basophils Absolute: 0 10*3/uL (ref 0.0–0.1)
Basophils Relative: 0 %
Eosinophils Absolute: 0 10*3/uL (ref 0.0–0.5)
Eosinophils Relative: 0 %
HCT: 36.4 % (ref 36.0–46.0)
Hemoglobin: 11.3 g/dL — ABNORMAL LOW (ref 12.0–15.0)
Immature Granulocytes: 0 %
Lymphocytes Relative: 8 %
Lymphs Abs: 0.9 10*3/uL (ref 0.7–4.0)
MCH: 30.7 pg (ref 26.0–34.0)
MCHC: 31 g/dL (ref 30.0–36.0)
MCV: 98.9 fL (ref 80.0–100.0)
Monocytes Absolute: 0.9 10*3/uL (ref 0.1–1.0)
Monocytes Relative: 8 %
Neutro Abs: 10.3 10*3/uL — ABNORMAL HIGH (ref 1.7–7.7)
Neutrophils Relative %: 84 %
Platelets: 209 10*3/uL (ref 150–400)
RBC: 3.68 MIL/uL — ABNORMAL LOW (ref 3.87–5.11)
RDW: 12.8 % (ref 11.5–15.5)
WBC: 12.2 10*3/uL — ABNORMAL HIGH (ref 4.0–10.5)
nRBC: 0 % (ref 0.0–0.2)

## 2022-01-15 LAB — GLUCOSE, CAPILLARY
Glucose-Capillary: 116 mg/dL — ABNORMAL HIGH (ref 70–99)
Glucose-Capillary: 120 mg/dL — ABNORMAL HIGH (ref 70–99)
Glucose-Capillary: 115 mg/dL — ABNORMAL HIGH (ref 70–99)
Glucose-Capillary: 151 mg/dL — ABNORMAL HIGH (ref 70–99)

## 2022-01-15 LAB — PROTIME-INR
INR: 1 (ref 0.8–1.2)
Prothrombin Time: 13.2 seconds (ref 11.4–15.2)

## 2022-01-15 LAB — URINALYSIS, ROUTINE W REFLEX MICROSCOPIC
Bilirubin Urine: NEGATIVE
Glucose, UA: NEGATIVE mg/dL
Ketones, ur: 5 mg/dL — AB
Leukocytes,Ua: NEGATIVE
Nitrite: NEGATIVE
Protein, ur: NEGATIVE mg/dL
Specific Gravity, Urine: 1.019 (ref 1.005–1.030)
pH: 6 (ref 5.0–8.0)

## 2022-01-15 LAB — APTT: aPTT: 36 seconds (ref 24–36)

## 2022-01-15 SURGERY — FIXATION, FRACTURE, INTERTROCHANTERIC, WITH INTRAMEDULLARY ROD
Anesthesia: Spinal | Laterality: Left

## 2022-01-15 MED ORDER — MIDAZOLAM HCL 2 MG/2ML IJ SOLN
INTRAMUSCULAR | Status: AC
  Filled 2022-01-15: qty 2

## 2022-01-15 MED ORDER — VANCOMYCIN HCL IN DEXTROSE 1-5 GM/200ML-% IV SOLN
1000.0000 mg | Freq: Two times a day (BID) | INTRAVENOUS | Status: AC
  Administered 2022-01-15 (×2): 1000 mg via INTRAVENOUS
  Filled 2022-01-15 (×3): qty 200

## 2022-01-15 MED ORDER — FENTANYL CITRATE (PF) 100 MCG/2ML IJ SOLN: 25.0000 ug | INTRAMUSCULAR | Status: DC | PRN

## 2022-01-15 MED ORDER — PANTOPRAZOLE SODIUM 40 MG PO TBEC
40.0000 mg | DELAYED_RELEASE_TABLET | Freq: Every day | ORAL | Status: DC
Start: 2022-01-16 — End: 2022-01-18
  Administered 2022-01-16 – 2022-01-18 (×3): 40 mg via ORAL
  Filled 2022-01-15 (×3): qty 1

## 2022-01-15 MED ORDER — FENTANYL CITRATE (PF) 100 MCG/2ML IJ SOLN
INTRAMUSCULAR | Status: AC
  Filled 2022-01-15: qty 2

## 2022-01-15 MED ORDER — LACTATED RINGERS IV SOLN: INTRAVENOUS | Status: DC | PRN

## 2022-01-15 MED ORDER — MORPHINE SULFATE (PF) 2 MG/ML IV SOLN: 1.0000 mg | INTRAVENOUS | Status: DC | PRN

## 2022-01-15 MED ORDER — TRANEXAMIC ACID-NACL 1000-0.7 MG/100ML-% IV SOLN: 1000.0000 mg | INTRAVENOUS | Status: DC

## 2022-01-15 MED ORDER — HYDROCODONE-ACETAMINOPHEN 5-325 MG PO TABS
1.0000 | ORAL_TABLET | ORAL | Status: DC | PRN
  Administered 2022-01-16 (×2): 2 via ORAL
  Administered 2022-01-17 – 2022-01-18 (×4): 1 via ORAL
  Filled 2022-01-15 (×2): qty 1
  Filled 2022-01-15: qty 2
  Filled 2022-01-15: qty 1

## 2022-01-15 MED ORDER — ACETAMINOPHEN 650 MG RE SUPP: 650.0000 mg | Freq: Four times a day (QID) | RECTAL | Status: DC | PRN

## 2022-01-15 MED ORDER — ONDANSETRON HCL 4 MG/2ML IJ SOLN
INTRAMUSCULAR | Status: DC | PRN
Start: 2022-01-15 — End: 2022-01-15
  Administered 2022-01-15: 4 mg via INTRAVENOUS

## 2022-01-15 MED ORDER — METOCLOPRAMIDE HCL 5 MG/ML IJ SOLN: 5.0000 mg | Freq: Three times a day (TID) | INTRAMUSCULAR | Status: DC | PRN

## 2022-01-15 MED ORDER — MENTHOL 3 MG MT LOZG: 1.0000 | LOZENGE | OROMUCOSAL | Status: DC | PRN

## 2022-01-15 MED ORDER — PROPOFOL 10 MG/ML IV BOLUS
INTRAVENOUS | Status: DC | PRN
  Administered 2022-01-15: 20 mg via INTRAVENOUS

## 2022-01-15 MED ORDER — TRANEXAMIC ACID-NACL 1000-0.7 MG/100ML-% IV SOLN
INTRAVENOUS | Status: AC
  Filled 2022-01-15: qty 100

## 2022-01-15 MED ORDER — DROPERIDOL 2.5 MG/ML IJ SOLN: 0.6250 mg | Freq: Once | INTRAMUSCULAR | Status: DC | PRN

## 2022-01-15 MED ORDER — TRANEXAMIC ACID-NACL 1000-0.7 MG/100ML-% IV SOLN
1000.0000 mg | INTRAVENOUS | Status: AC
  Administered 2022-01-15: 1000 mg via INTRAVENOUS
  Filled 2022-01-15: qty 100

## 2022-01-15 MED ORDER — TIMOLOL MALEATE 0.5 % OP SOLN
1.0000 [drp] | Freq: Every day | OPHTHALMIC | Status: DC
  Administered 2022-01-16 – 2022-01-18 (×3): 1 [drp] via OPHTHALMIC
  Filled 2022-01-15: qty 5

## 2022-01-15 MED ORDER — DEXAMETHASONE SODIUM PHOSPHATE 10 MG/ML IJ SOLN
INTRAMUSCULAR | Status: DC | PRN
  Administered 2022-01-15: 10 mg via INTRAVENOUS

## 2022-01-15 MED ORDER — ONDANSETRON HCL 4 MG PO TABS: 4.0000 mg | ORAL_TABLET | Freq: Four times a day (QID) | ORAL | Status: DC | PRN

## 2022-01-15 MED ORDER — ACETAMINOPHEN 325 MG PO TABS: 650.0000 mg | ORAL_TABLET | Freq: Four times a day (QID) | ORAL | Status: DC | PRN

## 2022-01-15 MED ORDER — ENOXAPARIN SODIUM 30 MG/0.3ML IJ SOSY: 30.0000 mg | PREFILLED_SYRINGE | INTRAMUSCULAR | Status: DC

## 2022-01-15 MED ORDER — VITAMIN D 25 MCG (1000 UNIT) PO TABS
2000.0000 [IU] | ORAL_TABLET | Freq: Every day | ORAL | Status: DC
  Administered 2022-01-16 – 2022-01-18 (×3): 2000 [IU] via ORAL
  Filled 2022-01-15 (×3): qty 2

## 2022-01-15 MED ORDER — FENTANYL CITRATE (PF) 100 MCG/2ML IJ SOLN
INTRAMUSCULAR | Status: DC | PRN
  Administered 2022-01-15 (×2): 25 ug via INTRAVENOUS

## 2022-01-15 MED ORDER — ZOLPIDEM TARTRATE 5 MG PO TABS
5.0000 mg | ORAL_TABLET | Freq: Every evening | ORAL | Status: DC | PRN
  Administered 2022-01-15: 5 mg via ORAL
  Filled 2022-01-15: qty 1

## 2022-01-15 MED ORDER — PHENYLEPHRINE HCL-NACL 20-0.9 MG/250ML-% IV SOLN
INTRAVENOUS | Status: DC | PRN
  Administered 2022-01-15: 10 ug/min via INTRAVENOUS

## 2022-01-15 MED ORDER — GLYCOPYRROLATE PF 0.2 MG/ML IJ SOSY
PREFILLED_SYRINGE | INTRAMUSCULAR | Status: DC | PRN
  Administered 2022-01-15: .2 mg via INTRAVENOUS

## 2022-01-15 MED ORDER — OXYCODONE HCL 5 MG PO TABS: 5.0000 mg | ORAL_TABLET | Freq: Once | ORAL | Status: DC | PRN

## 2022-01-15 MED ORDER — BISACODYL 10 MG RE SUPP
10.0000 mg | Freq: Every day | RECTAL | Status: DC | PRN
  Administered 2022-01-18: 10 mg via RECTAL
  Filled 2022-01-15: qty 1

## 2022-01-15 MED ORDER — PROPOFOL 500 MG/50ML IV EMUL
INTRAVENOUS | Status: DC | PRN
  Administered 2022-01-15: 80 ug/kg/min via INTRAVENOUS

## 2022-01-15 MED ORDER — CHLORHEXIDINE GLUCONATE 4 % EX LIQD
1.0000 "application " | Freq: Once | CUTANEOUS | Status: AC
  Administered 2022-01-16: 1 via TOPICAL

## 2022-01-15 MED ORDER — HYDROCODONE-ACETAMINOPHEN 7.5-325 MG PO TABS: 1.0000 | ORAL_TABLET | ORAL | Status: DC | PRN

## 2022-01-15 MED ORDER — CLORAZEPATE DIPOTASSIUM 3.75 MG PO TABS
3.7500 mg | ORAL_TABLET | Freq: Three times a day (TID) | ORAL | Status: DC | PRN
  Administered 2022-01-15 – 2022-01-16 (×2): 3.75 mg via ORAL
  Filled 2022-01-15 (×3): qty 1

## 2022-01-15 MED ORDER — TRANEXAMIC ACID-NACL 1000-0.7 MG/100ML-% IV SOLN
1000.0000 mg | Freq: Once | INTRAVENOUS | Status: AC
  Administered 2022-01-15: 1000 mg via INTRAVENOUS

## 2022-01-15 MED ORDER — CALCIUM CARBONATE 1250 (500 CA) MG PO TABS
500.0000 mg | ORAL_TABLET | Freq: Two times a day (BID) | ORAL | Status: DC
  Administered 2022-01-15 – 2022-01-18 (×6): 500 mg via ORAL
  Filled 2022-01-15 (×8): qty 1

## 2022-01-15 MED ORDER — LATANOPROST 0.005 % OP SOLN: 1.0000 [drp] | Freq: Every day | OPHTHALMIC | Status: DC

## 2022-01-15 MED ORDER — MORPHINE SULFATE (PF) 2 MG/ML IV SOLN: 1.0000 mg | INTRAVENOUS | Status: AC | PRN

## 2022-01-15 MED ORDER — 0.9 % SODIUM CHLORIDE (POUR BTL) OPTIME
TOPICAL | Status: DC | PRN
  Administered 2022-01-15: 1000 mL

## 2022-01-15 MED ORDER — LIDOCAINE HCL (PF) 2 % IJ SOLN
INTRAMUSCULAR | Status: AC
Start: 2022-01-15 — End: ?
  Filled 2022-01-15: qty 5

## 2022-01-15 MED ORDER — OXYCODONE HCL 5 MG/5ML PO SOLN: 5.0000 mg | Freq: Once | ORAL | Status: DC | PRN

## 2022-01-15 MED ORDER — HYDROCODONE-ACETAMINOPHEN 5-325 MG PO TABS: 1.0000 | ORAL_TABLET | Freq: Four times a day (QID) | ORAL | Status: DC | PRN

## 2022-01-15 MED ORDER — BUPIVACAINE HCL (PF) 0.5 % IJ SOLN
INTRAMUSCULAR | Status: DC | PRN
  Administered 2022-01-15: 12.5 mg via INTRATHECAL

## 2022-01-15 MED ORDER — MORPHINE SULFATE (PF) 2 MG/ML IV SOLN: 0.5000 mg | INTRAVENOUS | Status: DC | PRN

## 2022-01-15 MED ORDER — ACETAMINOPHEN 10 MG/ML IV SOLN: 1000.0000 mg | Freq: Once | INTRAVENOUS | Status: DC | PRN

## 2022-01-15 MED ORDER — FERROUS SULFATE 325 (65 FE) MG PO TABS
325.0000 mg | ORAL_TABLET | Freq: Every day | ORAL | Status: DC
  Administered 2022-01-16 – 2022-01-18 (×3): 325 mg via ORAL
  Filled 2022-01-15 (×3): qty 1

## 2022-01-15 MED ORDER — BUPIVACAINE-EPINEPHRINE (PF) 0.25% -1:200000 IJ SOLN
INTRAMUSCULAR | Status: DC | PRN
  Administered 2022-01-15: 30 mL

## 2022-01-15 MED ORDER — SODIUM CHLORIDE 0.45 % IV SOLN: INTRAVENOUS | Status: DC

## 2022-01-15 MED ORDER — SODIUM CHLORIDE 0.9 % IV SOLN
INTRAVENOUS | Status: DC
Start: 2022-01-15 — End: 2022-01-16

## 2022-01-15 MED ORDER — ROCURONIUM BROMIDE 10 MG/ML (PF) SYRINGE
PREFILLED_SYRINGE | INTRAVENOUS | Status: AC
  Filled 2022-01-15: qty 10

## 2022-01-15 MED ORDER — VENLAFAXINE HCL 37.5 MG PO TABS
75.0000 mg | ORAL_TABLET | Freq: Two times a day (BID) | ORAL | Status: DC
  Administered 2022-01-15 – 2022-01-18 (×6): 75 mg via ORAL
  Filled 2022-01-15 (×7): qty 2

## 2022-01-15 MED ORDER — VANCOMYCIN HCL IN DEXTROSE 1-5 GM/200ML-% IV SOLN: 1000.0000 mg | Freq: Two times a day (BID) | INTRAVENOUS | Status: DC

## 2022-01-15 MED ORDER — PROPOFOL 10 MG/ML IV BOLUS
INTRAVENOUS | Status: AC
Start: 2022-01-15 — End: ?
  Filled 2022-01-15: qty 20

## 2022-01-15 MED ORDER — PHENOL 1.4 % MT LIQD: 1.0000 | OROMUCOSAL | Status: DC | PRN

## 2022-01-15 MED ORDER — HYDROCODONE-ACETAMINOPHEN 5-325 MG PO TABS
1.0000 | ORAL_TABLET | Freq: Four times a day (QID) | ORAL | Status: DC | PRN
  Administered 2022-01-15: 2 via ORAL
  Filled 2022-01-15: qty 1
  Filled 2022-01-15 (×2): qty 2

## 2022-01-15 MED ORDER — PROPOFOL 500 MG/50ML IV EMUL
INTRAVENOUS | Status: AC
  Filled 2022-01-15: qty 50

## 2022-01-15 MED ORDER — LACTATED RINGERS IV SOLN: INTRAVENOUS | Status: DC

## 2022-01-15 MED ORDER — SENNA 8.6 MG PO TABS
1.0000 | ORAL_TABLET | Freq: Two times a day (BID) | ORAL | Status: DC
  Administered 2022-01-15 – 2022-01-18 (×7): 8.6 mg via ORAL
  Filled 2022-01-15 (×7): qty 1

## 2022-01-15 MED ORDER — ACETAMINOPHEN 10 MG/ML IV SOLN
INTRAVENOUS | Status: AC
  Administered 2022-01-15: 1000 mg via INTRAVENOUS
  Filled 2022-01-15: qty 100

## 2022-01-15 MED ORDER — ALUM & MAG HYDROXIDE-SIMETH 200-200-20 MG/5ML PO SUSP: 30.0000 mL | ORAL | Status: DC | PRN

## 2022-01-15 MED ORDER — MECLIZINE HCL 25 MG PO TABS
25.0000 mg | ORAL_TABLET | Freq: Three times a day (TID) | ORAL | Status: DC | PRN
  Filled 2022-01-15: qty 1

## 2022-01-15 MED ORDER — VANCOMYCIN HCL IN DEXTROSE 1-5 GM/200ML-% IV SOLN
INTRAVENOUS | Status: AC
  Filled 2022-01-15: qty 200

## 2022-01-15 MED ORDER — LIDOCAINE HCL (CARDIAC) PF 100 MG/5ML IV SOSY
PREFILLED_SYRINGE | INTRAVENOUS | Status: DC | PRN
Start: 2022-01-15 — End: 2022-01-15
  Administered 2022-01-15: 30 mg via INTRAVENOUS

## 2022-01-15 MED ORDER — FENTANYL CITRATE PF 50 MCG/ML IJ SOSY: 50.0000 ug | PREFILLED_SYRINGE | INTRAMUSCULAR | Status: DC | PRN

## 2022-01-15 MED ORDER — MAGNESIUM HYDROXIDE 400 MG/5ML PO SUSP
30.0000 mL | Freq: Every day | ORAL | Status: DC | PRN
  Administered 2022-01-16 – 2022-01-18 (×2): 30 mL via ORAL
  Filled 2022-01-15 (×2): qty 30

## 2022-01-15 MED ORDER — MIDAZOLAM HCL 5 MG/5ML IJ SOLN
INTRAMUSCULAR | Status: DC | PRN
Start: 2022-01-15 — End: 2022-01-15
  Administered 2022-01-15: 1 mg via INTRAVENOUS
  Administered 2022-01-15 (×2): .5 mg via INTRAVENOUS

## 2022-01-15 MED ORDER — ONDANSETRON HCL 4 MG/2ML IJ SOLN
4.0000 mg | Freq: Four times a day (QID) | INTRAMUSCULAR | Status: DC | PRN
  Administered 2022-01-16: 4 mg via INTRAVENOUS
  Filled 2022-01-15: qty 2

## 2022-01-15 MED ORDER — FLEET ENEMA 7-19 GM/118ML RE ENEM: 1.0000 | ENEMA | Freq: Once | RECTAL | Status: DC | PRN

## 2022-01-15 MED ORDER — DIVALPROEX SODIUM 125 MG PO DR TAB
125.0000 mg | DELAYED_RELEASE_TABLET | Freq: Two times a day (BID) | ORAL | Status: DC
  Administered 2022-01-15 – 2022-01-18 (×6): 125 mg via ORAL
  Filled 2022-01-15 (×7): qty 1

## 2022-01-15 MED ORDER — LATANOPROST 0.005 % OP SOLN
1.0000 [drp] | Freq: Every day | OPHTHALMIC | Status: DC
  Administered 2022-01-15 – 2022-01-17 (×3): 1 [drp] via OPHTHALMIC
  Filled 2022-01-15: qty 2.5

## 2022-01-15 MED ORDER — LACTATED RINGERS IV BOLUS
1000.0000 mL | Freq: Once | INTRAVENOUS | Status: AC
  Administered 2022-01-15: 1000 mL via INTRAVENOUS

## 2022-01-15 MED ORDER — POLYETHYLENE GLYCOL 3350 17 G PO PACK
17.0000 g | PACK | Freq: Every day | ORAL | Status: DC | PRN
  Administered 2022-01-18: 17 g via ORAL
  Filled 2022-01-15: qty 1

## 2022-01-15 MED ORDER — ACETAMINOPHEN 325 MG PO TABS: 325.0000 mg | ORAL_TABLET | Freq: Four times a day (QID) | ORAL | Status: DC | PRN

## 2022-01-15 MED ORDER — PHENYLEPHRINE HCL (PRESSORS) 10 MG/ML IV SOLN
INTRAVENOUS | Status: DC | PRN
Start: 2022-01-15 — End: 2022-01-15
  Administered 2022-01-15: 160 ug via INTRAVENOUS
  Administered 2022-01-15: 240 ug via INTRAVENOUS

## 2022-01-15 MED ORDER — METOCLOPRAMIDE HCL 10 MG PO TABS: 5.0000 mg | ORAL_TABLET | Freq: Three times a day (TID) | ORAL | Status: DC | PRN

## 2022-01-15 MED ORDER — VITAMIN B-12 1000 MCG PO TABS
1000.0000 ug | ORAL_TABLET | Freq: Every day | ORAL | Status: DC
  Administered 2022-01-16 – 2022-01-18 (×3): 1000 ug via ORAL
  Filled 2022-01-15 (×3): qty 1

## 2022-01-15 SURGICAL SUPPLY — 42 items
APL PRP STRL LF DISP 70% ISPRP (MISCELLANEOUS) ×2
BIT DRILL CALIBRATED 4.2 (BIT) IMPLANT
BLADE HELICAL TFNA 90 (Anchor) ×1 IMPLANT
BNDG COHESIVE 4X5 TAN ST LF (GAUZE/BANDAGES/DRESSINGS) ×2 IMPLANT
CHLORAPREP W/TINT 26 (MISCELLANEOUS) ×4 IMPLANT
DRAPE C-ARMOR (DRAPES) IMPLANT
DRAPE INCISE 23X17 IOBAN STRL (DRAPES) ×1
DRAPE INCISE 23X17 STRL (DRAPES) ×1 IMPLANT
DRAPE INCISE IOBAN 23X17 STRL (DRAPES) ×1 IMPLANT
DRILL BIT CALIBRATED 4.2 (BIT) ×2
DRSG AQUACEL AG ADV 3.5X10 (GAUZE/BANDAGES/DRESSINGS) IMPLANT
DRSG AQUACEL AG ADV 3.5X14 (GAUZE/BANDAGES/DRESSINGS) IMPLANT
ELECT REM PT RETURN 9FT ADLT (ELECTROSURGICAL) ×2
ELECTRODE REM PT RTRN 9FT ADLT (ELECTROSURGICAL) ×1 IMPLANT
GAUZE SPONGE 4X4 12PLY STRL (GAUZE/BANDAGES/DRESSINGS) ×2 IMPLANT
GAUZE XEROFORM 1X8 LF (GAUZE/BANDAGES/DRESSINGS) IMPLANT
GLOVE SURG ORTHO LTX SZ8.5 (GLOVE) ×2 IMPLANT
GLOVE SURG UNDER LTX SZ8 (GLOVE) ×2 IMPLANT
GOWN STRL REUS W/ TWL LRG LVL3 (GOWN DISPOSABLE) ×1 IMPLANT
GOWN STRL REUS W/TWL LRG LVL3 (GOWN DISPOSABLE) ×2
GOWN STRL REUS W/TWL LRG LVL4 (GOWN DISPOSABLE) ×2 IMPLANT
GUIDEWIRE 3.2X400 (WIRE) ×2 IMPLANT
KIT TURNOVER KIT A (KITS) ×2 IMPLANT
MANIFOLD NEPTUNE II (INSTRUMENTS) ×2 IMPLANT
MAT ABSORB  FLUID 56X50 GRAY (MISCELLANEOUS) ×2
MAT ABSORB FLUID 56X50 GRAY (MISCELLANEOUS) ×1 IMPLANT
NAIL TROCH FIX 10X170 130 (Nail) ×1 IMPLANT
NDL SPNL 18GX3.5 QUINCKE PK (NEEDLE) ×1 IMPLANT
NEEDLE SPNL 18GX3.5 QUINCKE PK (NEEDLE) ×2 IMPLANT
NS IRRIG 500ML POUR BTL (IV SOLUTION) ×2 IMPLANT
PACK HIP COMPR (MISCELLANEOUS) ×2 IMPLANT
SCREW LOCK STAR 5X38 (Screw) ×1 IMPLANT
SOL PREP PVP 2OZ (MISCELLANEOUS) ×2
SOLUTION PREP PVP 2OZ (MISCELLANEOUS) ×1 IMPLANT
STAPLER SKIN PROX 35W (STAPLE) ×2 IMPLANT
SUCTION FRAZIER HANDLE 10FR (MISCELLANEOUS) ×2
SUCTION TUBE FRAZIER 10FR DISP (MISCELLANEOUS) ×1 IMPLANT
SUT VIC AB 0 CT1 36 (SUTURE) ×2 IMPLANT
SUT VIC AB 2-0 CT1 27 (SUTURE) ×2
SUT VIC AB 2-0 CT1 TAPERPNT 27 (SUTURE) ×1 IMPLANT
SYR 30ML LL (SYRINGE) ×2 IMPLANT
WATER STERILE IRR 500ML POUR (IV SOLUTION) ×2 IMPLANT

## 2022-01-15 NOTE — Assessment & Plan Note (Addendum)
Status post left IM nail.  Case management working on skilled nursing facility short-term.  Started on aspirin for DVT prophylaxis.  Follow-up in orthopedics in 2 weeks for x-rays and staple removal.  Patient allowed to be 50% weightbearing on that leg. ?

## 2022-01-15 NOTE — ED Provider Notes (Signed)
? ?[facility] ?Provider Note ? ? ? Event Date/Time  ? First MD Initiated Contact with Patient 01/14/22 2255   ?  (approximate) ? ? ?History  ? ?Fall ? ? ?HPI ? ?Alexandria Sherman is a 76 y.o. female with a history of hypertension, hyperthyroidism, right bundle branch block who presents for evaluation of a mechanical fall.  Patient lives at home independently.  She reports that she bent over to get a water bottle when she lost her balance and fell onto her left hip.  She has been unable to get up or bear weight.  She thinks she might have hit her head but not significant trauma.  She denies neck pain or back pain, chest pain, abdominal pain, upper extremity pain.  She is complaining of left hip pain that is only present when she tries to move her leg.  She denies any pain just laying still. ?  ? ? ?Past Medical History:  ?Diagnosis Date  ? Anxiety disorder, unspecified   ? Depression 1978  ? Diverticulosis of colon 04/2005  ? GERD (gastroesophageal reflux disease)   ? Hypertension   ? Hyperthyroidism   ? Dr Tedd SiasSolum  ? Impaired fasting glucose   ? Low TSH level   ? chronic low, but euthyroid  ? Microscopic hematuria   ? RBBB   ? Sleep disturbance   ? Subclinical hyperthyroidism   ? unspecified  ? Tibia fracture 05/2010  ? left  ? ? ?Past Surgical History:  ?Procedure Laterality Date  ? ABDOMINAL HYSTERECTOMY  1988  ? hysterectomy/ BSO for endometriosis  ? BREAST BIOPSY Right 1992  ? neg  ? BREAST EXCISIONAL BIOPSY Right 1988  ? benign per pt  ? KNEE ARTHROSCOPY  6/12  ? left--Dr Charlann Boxerlin  ? ? ? ?Physical Exam  ? ?Triage Vital Signs: ?ED Triage Vitals  ?Enc Vitals Group  ?   BP 01/14/22 2245 (!) 164/97  ?   Pulse Rate 01/14/22 2245 95  ?   Resp 01/14/22 2249 18  ?   Temp 01/14/22 2240 98.1 ?F (36.7 ?C)  ?   Temp Source 01/14/22 2240 Oral  ?   SpO2 01/14/22 2245 97 %  ?   Weight 01/14/22 2236 133 lb (60.3 kg)  ?   Height --   ?   Head Circumference --   ?   Peak Flow --   ?   Pain Score 01/14/22 2235 9  ?    Pain Loc --   ?   Pain Edu? --   ?   Excl. in GC? --   ? ? ?Most recent vital signs: ?Vitals:  ? 01/14/22 2245 01/14/22 2249  ?BP: (!) 164/97   ?Pulse: 95   ?Resp:  18  ?Temp:    ?SpO2: 97%   ? ? ?Full spinal precautions maintained throughout the trauma exam. ?Constitutional: Alert and oriented. No acute distress. Does not appear intoxicated. ?HEENT ?Head: Normocephalic and atraumatic. ?Face: No facial bony tenderness. Stable midface ?Ears: No hemotympanum bilaterally. No Battle sign ?Eyes: No eye injury. PERRL. No raccoon eyes ?Nose: Nontender. No epistaxis. No rhinorrhea ?Mouth/Throat: Mucous membranes are moist. No oropharyngeal blood. No dental injury. Airway patent without stridor. Normal voice. ?Neck: no C-collar. No midline c-spine tenderness.  ?Cardiovascular: Normal rate, regular rhythm. Normal and symmetric distal pulses are present in all extremities. ?Pulmonary/Chest: Chest wall is stable and nontender to palpation/compression. Normal respiratory effort. Breath sounds are normal. No crepitus.  ?Abdominal: Soft, nontender, non distended. ?  Musculoskeletal: Left lower extremity is shortened externally rotated. Nontender with normal full range of motion in all other extremities. No deformities. No thoracic or lumbar midline spinal tenderness. Pelvis is stable. ?Skin: Skin is warm, dry and intact. No abrasions or contutions. ?Psychiatric: Speech and behavior are appropriate. ?Neurological: Normal speech and language. Moves all extremities to command. No gross focal neurologic deficits are appreciated. ? ?Glascow Coma Score: ?4 - Opens eyes on own ?6 - Follows simple motor commands ?5 - Alert and oriented ?GCS: 15 ? ? ?ED Results / Procedures / Treatments  ? ?Labs ?(all labs ordered are listed, but only abnormal results are displayed) ?Labs Reviewed  ?PROTIME-INR  ?APTT  ?CBC WITH DIFFERENTIAL/PLATELET  ?BASIC METABOLIC PANEL  ?TYPE AND SCREEN  ? ? ? ?EKG ? ?ED ECG REPORT ?I, Nita Sickle, the attending  physician, personally viewed and interpreted this ECG. ? ?Sinus rhythm, RBBB, no ST elevations or depressions.  Unchanged when compared to prior. ? ?RADIOLOGY ?I, Nita Sickle, attending MD, have personally viewed and interpreted the images obtained during this visit as below: ? ?X-ray of the hip concerning for a left intertrochanteric fracture ? ?Chest x-ray negative ? ?Head CT negative ? ? ?___________________________________________________ ?Interpretation by Radiologist:  ?DG Chest 1 View ? ?Result Date: 01/15/2022 ?CLINICAL DATA:  Fall EXAM: CHEST  1 VIEW COMPARISON:  None. FINDINGS: Lungs are clear. Eventration of the right hemidiaphragm. No pleural effusion or pneumothorax. The heart is normal in size. IMPRESSION: No evidence of acute cardiopulmonary disease. Electronically Signed   By: Charline Bills M.D.   On: 01/15/2022 00:03  ? ?CT Head Wo Contrast ? ?Result Date: 01/14/2022 ?CLINICAL DATA:  Head trauma.  Mechanical fall admitted. EXAM: CT HEAD WITHOUT CONTRAST TECHNIQUE: Contiguous axial images were obtained from the base of the skull through the vertex without intravenous contrast. RADIATION DOSE REDUCTION: This exam was performed according to the departmental dose-optimization program which includes automated exposure control, adjustment of the mA and/or kV according to patient size and/or use of iterative reconstruction technique. COMPARISON:  MRI examination dated April 17, 2013 FINDINGS: Brain: No evidence of acute infarction, hemorrhage, hydrocephalus, extra-axial collection or mass lesion/mass effect. Low-attenuation of the periventricular and subcortical white matter presumed chronic microvascular ischemic changes. Cerebral atrophy. Vascular: No hyperdense vessel or unexpected calcification. Skull: Normal. Negative for fracture or focal lesion. Sinuses/Orbits: No acute finding. Other: None. IMPRESSION: 1. No acute intracranial abnormality. 2. Cerebral atrophy and chronic microvascular  ischemic changes of the white matter. Electronically Signed   By: Larose Hires D.O.   On: 01/14/2022 23:33  ? ?DG Hip Unilat W or Wo Pelvis 2-3 Views Left ? ?Result Date: 01/15/2022 ?CLINICAL DATA:  Fall, pain EXAM: DG HIP (WITH OR WITHOUT PELVIS) 2-3V LEFT COMPARISON:  None. FINDINGS: Intertrochanteric left hip fracture with foreshortening and varus angulation. Visualized bony pelvis appears intact. Bilateral hip joint spaces are preserved Surgical clips overlying the left pelvis. IMPRESSION: Intertrochanteric left hip fracture, as above. Electronically Signed   By: Charline Bills M.D.   On: 01/15/2022 00:02   ? ? ? ? ?PROCEDURES: ? ?Critical Care performed: No ? ?Procedures ? ? ? ?IMPRESSION / MDM / ASSESSMENT AND PLAN / ED COURSE  ?I reviewed the triage vital signs and the nursing notes. ? ? 76 y.o. female with a history of hypertension, hyperthyroidism, right bundle branch block who presents for evaluation of a mechanical fall.  Patient has a left lower extremity that is shortened and externally rotated.  Neurovascularly intact otherwise.  Also complaining of head trauma with.  Head is atraumatic.  No midline spine tenderness. ? ?Ddx: Hip fracture, dislocation, contusion ? ? ?Plan: Pelvis with hip x-ray, CT head ? ? ?MEDICATIONS GIVEN IN ED: ?Medications  ?fentaNYL (SUBLIMAZE) injection 50 mcg (has no administration in time range)  ?lactated ringers infusion (has no administration in time range)  ?lactated ringers bolus 1,000 mL (has no administration in time range)  ? ? ? ?ED COURSE: X-ray consistent with a left intertrochanteric fracture.  CT head was negative.  Chest x-ray, EKG, and basic labs done for preop.  EKG is unchanged from baseline.  Chest x-ray negative.  Labs with no acute findings.  Orthopedic was consulted and I discussed with Dr. Hyacinth Meeker who recommended admission to the hospitalist service for possible operation repair in the morning.  Patient will remain NPO.  IV fluids have been ordered.   Consulted Dr. Sedalia Muta from the hospitalist service who accepted patient to her service. ? ? ?Consults: Orthopedics and hospitalist service ? ? ?EMR reviewed including patient's last visit with her PCP for a genera

## 2022-01-15 NOTE — Hospital Course (Addendum)
[name] is a 76 year old female with history of anxiety, GERD, depression, who presented to the emergency room on 4/23 after having an unwitnessed fall.  Patient found to have a left-sided intertrochanteric fracture.  Seen by orthopedic surgery and taken that same day for IM nail.  Hemoglobin on day of presentation 11.3 and on 11/24, at 7.1.  Patient received 1 unit of blood and hemoglobin up to 8.8, down to 7.7 then 7.1 but this was likely dilutaionl effect, repeat CBC later same day showed increase back to 7.7 from 7.1 01/18/22.  ?

## 2022-01-15 NOTE — Anesthesia Procedure Notes (Signed)
Spinal ? ?Patient location during procedure: OR ?Start time: 01/15/2022 11:41 AM ?End time: 01/15/2022 11:41 AM ?Reason for block: surgical anesthesia ?Staffing ?Performed: anesthesiologist  ?Anesthesiologist: Foye Deer, MD ?Preanesthetic Checklist ?Completed: patient identified, IV checked, site marked, risks and benefits discussed, surgical consent, monitors and equipment checked, pre-op evaluation and timeout performed ?Spinal Block ?Patient position: right lateral decubitus ?Prep: DuraPrep ?Patient monitoring: heart rate, cardiac monitor, continuous pulse ox and blood pressure ?Approach: midline ?Location: L3-4 ?Injection technique: single-shot ?Needle ?Needle type: Sprotte  ?Needle gauge: 24 G ?Needle length: 9 cm ?Assessment ?Events: CSF return ?Additional Notes ?Two attempts. First attempt at L3/4. Repositioning without success. Second attempt at L2/3 ? ? ? ?

## 2022-01-15 NOTE — Transfer of Care (Signed)
Immediate Anesthesia Transfer of Care Note ? ?Patient: Alexandria Sherman ? ?Procedure(s) Performed: INTRAMEDULLARY (IM) NAIL INTERTROCHANTRIC (Left) ? ?Patient Location: PACU ? ?Anesthesia Type:General and Spinal ? ?Level of Consciousness: drowsy ? ?Airway & Oxygen Therapy: Patient Spontanous Breathing and Patient connected to face mask oxygen ? ?Post-op Assessment: Report given to RN, Post -op Vital signs reviewed and stable and Patient moving all extremities X 4 ? ?Post vital signs: stable ? ?Last Vitals:  ?Vitals Value Taken Time  ?BP 104/62 01/15/22 1230  ?Temp    ?Pulse 95 01/15/22 1232  ?Resp 17 01/15/22 1232  ?SpO2 100 % 01/15/22 1232  ?Vitals shown include unvalidated device data. ? ?Last Pain:  ?Vitals:  ? 01/15/22 1225  ?TempSrc:   ?PainSc: 0-No pain  ?   ? ?Patients Stated Pain Goal: 0 (01/15/22 0735) ? ?Complications: No notable events documented. ?

## 2022-01-15 NOTE — Progress Notes (Signed)
Patient can feel sensations at level of knee and twitched right foot slightly when asked.  Bladder scan completed, only 225 cc of urine in bladder.   Called Dr. Wynetta Emery to discuss returning patient to room.  Per orde, okay just report to floor to assess bladder scan within 2 hours if no voiding. ?

## 2022-01-15 NOTE — Assessment & Plan Note (Signed)
At baseline, per patient ?

## 2022-01-15 NOTE — Assessment & Plan Note (Signed)
-   Resumed home Depakote 125 mg p.o. twice daily ?

## 2022-01-15 NOTE — Anesthesia Preprocedure Evaluation (Addendum)
Anesthesia Evaluation  ?Patient identified by MRN, date of birth, ID band ?Patient awake ? ? ? ?Reviewed: ?Allergy & Precautions, NPO status , Patient's Chart, lab work & pertinent test results ? ?Airway ?Mallampati: III ? ?TM Distance: >3 FB ?Neck ROM: full ? ? ? Dental ?no notable dental hx. ? ?  ?Pulmonary ?neg pulmonary ROS,  ?  ?Pulmonary exam normal ? ? ? ? ? ? ? Cardiovascular ?METS: < 3 Mets hypertension, Normal cardiovascular exam+ dysrhythmias (RBBB)  ? ?Chronic venous insufficiency of lower extremity ?  ?Neuro/Psych ?PSYCHIATRIC DISORDERS Depression Episodic mood disorderWeakness and gait instability  ?Left leg weakness ?  ? GI/Hepatic ?Neg liver ROS, GERD  Controlled and Medicated,  ?Endo/Other  ?negative endocrine ROS ? Renal/GU ?  ? ?  ?Musculoskeletal ? ? Abdominal ?Normal abdominal exam  (+)   ?Peds ? Hematology ?negative hematology ROS ?(+)   ?Anesthesia Other Findings ?Left hip Fracture ? ?Past Medical History: ?No date: Anxiety disorder, unspecified ?1978: Depression ?04/2005: Diverticulosis of colon ?No date: GERD (gastroesophageal reflux disease) ?No date: Hypertension ?No date: Hyperthyroidism ?    Comment:  Dr Tedd Sias ?No date: Impaired fasting glucose ?No date: Low TSH level ?    Comment:  chronic low, but euthyroid ?No date: Microscopic hematuria ?No date: RBBB ?No date: Sleep disturbance ?No date: Subclinical hyperthyroidism ?    Comment:  unspecified ?05/2010: Tibia fracture ?    Comment:  left ? ?Past Surgical History: ?1988: ABDOMINAL HYSTERECTOMY ?    Comment:  hysterectomy/ BSO for endometriosis ?1992: BREAST BIOPSY; Right ?    Comment:  neg ?1988: BREAST EXCISIONAL BIOPSY; Right ?    Comment:  benign per pt ?6/12: KNEE ARTHROSCOPY ?    Comment:  left--Dr Charlann Boxer ? ?BMI   ? Body Mass Index: 22.13 kg/m?  ?  ? ? Reproductive/Obstetrics ?negative OB ROS ? ?  ? ? ? ? ? ? ? ? ? ? ? ? ? ?  ?  ? ? ? ? ? ? ? ?Anesthesia Physical ?Anesthesia Plan ? ?ASA:  2 ? ?Anesthesia Plan: Spinal  ? ?Post-op Pain Management: Regional block*  ? ?Induction: Intravenous ? ?PONV Risk Score and Plan: Ondansetron and Dexamethasone ? ?Airway Management Planned: Natural Airway and Simple Face Mask ? ?Additional Equipment:  ? ?Intra-op Plan:  ? ?Post-operative Plan:  ? ?Informed Consent: I have reviewed the patients History and Physical, chart, labs and discussed the procedure including the risks, benefits and alternatives for the proposed anesthesia with the patient or authorized representative who has indicated his/her understanding and acceptance.  ? ? ? ?Dental advisory given ? ?Plan Discussed with: Anesthesiologist, CRNA and Surgeon ? ?Anesthesia Plan Comments:   ? ? ? ? ? ?Anesthesia Quick Evaluation ? ?

## 2022-01-15 NOTE — TOC CM/SW Note (Signed)
CSW acknowledges consult.  ?TOC to follow up after PT/OT evals. ? ?Saivion Goettel, LCSW ?[phone] ? ?

## 2022-01-15 NOTE — Anesthesia Postprocedure Evaluation (Signed)
Anesthesia Post Note ? ?Patient: Alexandria Sherman ? ?Procedure(s) Performed: INTRAMEDULLARY (IM) NAIL INTERTROCHANTRIC (Left) ? ?Patient location during evaluation: PACU ?Anesthesia Type: Spinal ?Level of consciousness: awake and alert ?Pain management: pain level controlled ?Vital Signs Assessment: post-procedure vital signs reviewed and stable ?Respiratory status: spontaneous breathing, nonlabored ventilation, respiratory function stable and patient connected to nasal cannula oxygen ?Cardiovascular status: blood pressure returned to baseline and stable ?Postop Assessment: no apparent nausea or vomiting, spinal receding, no headache and no backache ?Anesthetic complications: no ? ? ?No notable events documented. ? ? ?Last Vitals:  ?Vitals:  ? 01/15/22 1315 01/15/22 1330  ?BP: 99/84 97/69  ?Pulse: 85 75  ?Resp: 19 16  ?Temp: 36.4 ?C   ?SpO2: 97% 100%  ?  ?Last Pain:  ?Vitals:  ? 01/15/22 1315  ?TempSrc:   ?PainSc: 0-No pain  ? ? ?  ?  ?  ?  ?  ?  ? ?[place] Ouch ? ? ? ? ?

## 2022-01-15 NOTE — Assessment & Plan Note (Signed)
PPI ?

## 2022-01-15 NOTE — Plan of Care (Signed)

## 2022-01-15 NOTE — Progress Notes (Signed)
Patient admitted early hours of the morning. She came in after she had accidental fall at home. Found to have left hip fracture. ?No family at bedside. ?Patient denies chest pain or shortness of breath. She is otherwise active and lives independently at home. ? ?Plans for orthopedic surgery noted per Dr. Hyacinth Meeker ? ?called and spoke with patient's brother-in-law Alexandria Sherman ?

## 2022-01-15 NOTE — H&P (Signed)
THE PATIENT WAS SEEN PRIOR TO SURGERY TODAY.  HISTORY, ALLERGIES, HOME MEDICATIONS AND OPERATIVE PROCEDURE WERE REVIEWED. RISKS AND BENEFITS OF SURGERY DISCUSSED WITH PATIENT AGAIN.  NO CHANGES FROM INITIAL HISTORY AND PHYSICAL NOTED.    

## 2022-01-15 NOTE — Consult Note (Signed)
?ORTHOPAEDIC CONSULTATION ? ?REQUESTING PHYSICIAN: Enedina Finner, MD ? ?Chief Complaint: Left hip pain ? ?HPI: ?Alexandria Sherman is a 76 y.o. female who complains of left hip pain after a fall at home last evening.  The patient bent over and lost her balance and fell.  She uses a rolled later walker most of the time.  She lives by herself at home.  Her health is generally good.  She was brought to the emergency room where exam and x-rays revealed a minimally displaced intertrochanteric fracture of the left hip.  She has been admitted for medical clearance and surgical stabilization of the fracture.  She has been cleared by the medical service.  Her lab work is in good shape.  Risks and benefits and postop protocol of were discussed with the patient and she understands and wishes to proceed with surgery. ? ?Past Medical History:  ?Diagnosis Date  ? Anxiety disorder, unspecified   ? Depression 1978  ? Diverticulosis of colon 04/2005  ? GERD (gastroesophageal reflux disease)   ? Hypertension   ? Hyperthyroidism   ? Dr Tedd Sias  ? Impaired fasting glucose   ? Low TSH level   ? chronic low, but euthyroid  ? Microscopic hematuria   ? RBBB   ? Sleep disturbance   ? Subclinical hyperthyroidism   ? unspecified  ? Tibia fracture 05/2010  ? left  ? ?Past Surgical History:  ?Procedure Laterality Date  ? ABDOMINAL HYSTERECTOMY  1988  ? hysterectomy/ BSO for endometriosis  ? BREAST BIOPSY Right 1992  ? neg  ? BREAST EXCISIONAL BIOPSY Right 1988  ? benign per pt  ? KNEE ARTHROSCOPY  6/12  ? left--Dr Charlann Boxer  ? ?Social History  ? ?Socioeconomic History  ? Marital status: Divorced  ?  Spouse name: Not on file  ? Number of children: 0  ? Years of education: 33  ? Highest education level: Bachelor's degree (e.g., BA, AB, BS)  ?Occupational History  ? Occupation: Teacher--retired  ?Tobacco Use  ? Smoking status: Never  ?  Passive exposure: Past  ? Smokeless tobacco: Never  ?Vaping Use  ? Vaping Use: Never used  ?Substance and Sexual Activity  ?  Alcohol use: Yes  ?  Comment: Occasional  ? Drug use: Not on file  ? Sexual activity: Not Currently  ?Other Topics Concern  ? Not on file  ?Social History Narrative  ? Has living will  ? Both sisters have health care POA--- forms in chart  ? Now has DNR  ? No tube feeds if cognitively unaware  ? ?Social Determinants of Health  ? ?Financial Resource Strain: Not on file  ?Food Insecurity: Not on file  ?Transportation Needs: Not on file  ?Physical Activity: Not on file  ?Stress: Not on file  ?Social Connections: Not on file  ? ?Family History  ?Problem Relation Age of Onset  ? Cancer Mother   ?     lung  ? Diabetes Mother   ? Depression Mother   ? Cancer Sister   ?     oral cancer  ? Depression Sister   ? Depression Sister   ? Stroke Other   ? Heart attack Other   ? Cancer Other   ? Dementia Other   ? Breast cancer Neg Hx   ? Thyroid disease Neg Hx   ? ?Allergies  ?Allergen Reactions  ? Augmentin [Amoxicillin-Pot Clavulanate] Other (See Comments)  ?  Pt cannot remember  ? Cephalexin   ?  REACTION:  vaginal blisters ?Tolerates penicillins  ? Codeine Phosphate   ?  REACTION: vomiting  ? Promethazine Hcl   ?  REACTION: nerves  ? ?Prior to Admission medications   ?Medication Sig Start Date End Date Taking? Authorizing Provider  ?aspirin EC 81 MG tablet Take 81 mg by mouth every other day.   Yes [provider]  ?calcium carbonate (OSCAL) 1500 (600 Ca) MG TABS tablet Take 600 mg of elemental calcium 2 (two) times daily by mouth.   Yes [provider]  ?cholecalciferol (VITAMIN D) 1000 UNITS tablet Take 2,000 Units by mouth daily.   Yes [provider]  ?clorazepate (TRANXENE) 3.75 MG tablet TAKE 1 TABLET BY MOUTH 3 TIMES DAILY AS NEEDED FOR ANXIETY. 11/18/21  Yes Tillman Abide I, MD  ?divalproex (DEPAKOTE) 125 MG DR tablet TAKE 1 TABLET BY MOUTH TWICE DAILY 11/18/21  Yes Karie Schwalbe, MD  ?latanoprost (XALATAN) 0.005 % ophthalmic solution Place 1 drop at bedtime into both eyes.  01/15/12  Yes  [provider]  ?omeprazole (PRILOSEC) 20 MG capsule TAKE 1 CAPSULE BY MOUTH ONCE DAILY 03/02/21  Yes Karie Schwalbe, MD  ?timolol (TIMOPTIC) 0.5 % ophthalmic solution Place 1 drop daily into both eyes.   Yes [provider]  ?venlafaxine (EFFEXOR) 75 MG tablet TAKE 1 TABLET BY MOUTH TWICE DAILY 11/18/21  Yes Karie Schwalbe, MD  ?vitamin B-12 (CYANOCOBALAMIN) 1000 MCG tablet Take 1,000 mcg by mouth daily.   Yes [provider]  ?ibuprofen (ADVIL,MOTRIN) 200 MG tablet Take 200 mg by mouth every 6 (six) hours as needed.    [provider]  ?meclizine (ANTIVERT) 25 MG tablet Take 25 mg 3 (three) times daily as needed by mouth for dizziness.    [provider]  ?mometasone (ELOCON) 0.1 % cream Apply 1 application topically 2 (two) times daily as needed (itching). 12/20/17   Karie Schwalbe, MD  ?olopatadine (PATANOL) 0.1 % ophthalmic solution Place 1 drop 2 (two) times daily as needed into both eyes.  ?Patient not taking: Reported on 01/15/2022 09/16/12   Karie Schwalbe, MD  ?Calcium Carbonate (CALCIUM 500 PO) Take one by mouth twice a day   07/17/19  [provider]  ? ?DG Chest 1 View ? ?Result Date: 01/15/2022 ?CLINICAL DATA:  Fall EXAM: CHEST  1 VIEW COMPARISON:  None. FINDINGS: Lungs are clear. Eventration of the right hemidiaphragm. No pleural effusion or pneumothorax. The heart is normal in size. IMPRESSION: No evidence of acute cardiopulmonary disease. Electronically Signed   By: Charline Bills M.D.   On: 01/15/2022 00:03  ? ?CT Head Wo Contrast ? ?Result Date: 01/14/2022 ?CLINICAL DATA:  Head trauma.  Mechanical fall admitted. EXAM: CT HEAD WITHOUT CONTRAST TECHNIQUE: Contiguous axial images were obtained from the base of the skull through the vertex without intravenous contrast. RADIATION DOSE REDUCTION: This exam was performed according to the departmental dose-optimization program which includes automated exposure control, adjustment of the mA  and/or kV according to patient size and/or use of iterative reconstruction technique. COMPARISON:  MRI examination dated April 17, 2013 FINDINGS: Brain: No evidence of acute infarction, hemorrhage, hydrocephalus, extra-axial collection or mass lesion/mass effect. Low-attenuation of the periventricular and subcortical white matter presumed chronic microvascular ischemic changes. Cerebral atrophy. Vascular: No hyperdense vessel or unexpected calcification. Skull: Normal. Negative for fracture or focal lesion. Sinuses/Orbits: No acute finding. Other: None. IMPRESSION: 1. No acute intracranial abnormality. 2. Cerebral atrophy and chronic microvascular ischemic changes of the white matter. Electronically Signed  By: Larose HiresImran  Ahmed D.O.   On: 01/14/2022 23:33  ? ?DG Hip Unilat W or Wo Pelvis 2-3 Views Left ? ?Result Date: 01/15/2022 ?CLINICAL DATA:  Fall, pain EXAM: DG HIP (WITH OR WITHOUT PELVIS) 2-3V LEFT COMPARISON:  None. FINDINGS: Intertrochanteric left hip fracture with foreshortening and varus angulation. Visualized bony pelvis appears intact. Bilateral hip joint spaces are preserved Surgical clips overlying the left pelvis. IMPRESSION: Intertrochanteric left hip fracture, as above. Electronically Signed   By: Charline BillsSriyesh  Krishnan M.D.   On: 01/15/2022 00:02   ? ?Positive ROS: All other systems have been reviewed and were otherwise negative with the exception of those mentioned in the HPI and as above. ? ?Physical Exam: ?General: Alert, no acute distress ?Cardiovascular: No pedal edema ?Respiratory: No cyanosis, no use of accessory musculature ?GI: No organomegaly, abdomen is soft and non-tender ?Skin: No lesions in the area of chief complaint ?Neurologic: Sensation intact distally ?Psychiatric: Patient is competent for consent with normal mood and affect ?Lymphatic: No axillary or cervical lymphadenopathy ? ?MUSCULOSKELETAL: Patient alert oriented x3 and sitting up in the bed.  The left leg is shortened and rotated.   There is severe pain with movement.  Neurovascular status is good distally.  The skin is intact.  The right lower extremity is normal to exam.  Upper extremities and spine are normal. ? ?Assessment: ?Mildly

## 2022-01-15 NOTE — Assessment & Plan Note (Signed)
-   Resumed home Tranxene/clorazepate 3.175 mg p.o. 3 times daily as needed for anxiety, venlafaxine 75 mg p.o. twice daily ?

## 2022-01-15 NOTE — H&P (Addendum)
?History and Physical  ? ?[name] JJO:[id number] DOB: 03/29/1946 DOA: 01/14/2022 ? ?PCP: Karie Schwalbe, MD  ?Patient coming from: Home ? ?I have personally briefly reviewed patient's old medical records in [facility] EMR. ? ?Chief Concern: Fall ? ?HPI: [name] is a 76 year old female with history of anxiety, GERD, depression, who presents emergency department for chief concerns of a fall that was unwitnessed. ? ?Patient had denied loss of consciousness. ? ?Initial vitals in the emergency department showed temperature 98.1, respiration rate of 18, heart rate 85, blood pressure 164/97, SPO2 of 97% on room air. ? ?At the time of my admission BMP and CBC have been collected and pending results. ? ?ED treatment: None ? ?She is aaox3 at bedside. She states she was bending over to pick up a water bottle. She states at baseline she uses a rollator. She lives by herself. She denies chest pain, nausea, vomiting, dysuria, hematuria, diarrhea. She reports the pain is worse with movement. She reports the fentanyl EMS gave her, helped her significantly and that it did cause her nausea.  ? ?Social history: She lives at home by herself. She denies tobacco, etoh, and recreational drug use. 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 ? ?Vaccination history: She has had 4 shots of covid vaccine. ? ?ROS: ?Constitutional: no weight change, no fever ?ENT/Mouth: no sore throat, no rhinorrhea ?Eyes: no eye pain, no vision changes ?Cardiovascular: no chest pain, no dyspnea,  no edema, no palpitations ?Respiratory: no cough, no sputum, no wheezing ?Gastrointestinal: no nausea, no vomiting, no diarrhea, no constipation ?Genitourinary: no urinary incontinence, no dysuria, no hematuria ?Musculoskeletal: no arthralgias, no myalgias, left hip pain, worse with movement ?Skin: no skin lesions, no pruritus, ?Neuro: + weakness, no loss of consciousness, no syncope ?Psych: no anxiety, no depression, no  decrease appetite ?Heme/Lymph: no bruising, no bleeding ? ?ED Course: Discussed with EDP, patient requiring hospitalization for chief concerns of intertrochanteric left hip fracture. ? ?Assessment/Plan ? ?Principal Problem: ?  Closed 2-part intertrochanteric fracture of left femur, initial encounter (HCC) ?Active Problems: ?  Episodic mood disorder (HCC) ?  Essential hypertension, benign ?  GERD ?  Major depression in remission [facility]) ?  Chronic venous insufficiency of lower extremity ?  ?Assessment and Plan: ? ?* Closed 2-part intertrochanteric fracture of left femur, initial encounter (HCC) ?- Admit to MedSurg, inpatient ?- TOC consulted ?- Pain control Norco 5-3 25, 1 to 2 tablets every 6 hours as needed for moderate pain, 96 hours ordered, morphine 1 mg IV every 2 hours as needed for severe pain, 48 hours ordered ?- Keep n.p.o. except for sips of meds ?- Orthopedic, Dr. Hyacinth Meeker has been consulted by ED provider and states he will look at the OR schedule and if possible will take patient to the OR on 01/15/2022 ? ?Chronic venous insufficiency of lower extremity ?At baseline, per patient ? ?Major depression in remission [facility]) ?- Resumed home Depakote 125 mg p.o. twice daily ? ?GERD ?- PPI ? ?Episodic mood disorder (HCC) ?- Resumed home Tranxene/clorazepate 3.175 mg p.o. 3 times daily as needed for anxiety, venlafaxine 75 mg p.o. twice daily ? ?AM team to initiate DVT prophylaxis when appropriate.  ? ?Chart reviewed.  ? ?DVT prophylaxis: ted hose ?Code Status: DNR, pt states she has a DNR form on her refrigerator at home  ?Diet: NPO, except for sips with meds ?Family Communication: she states her brother in law in [place] knows she is  in hospital and do not need to be called ?Disposition Plan: pending ortho evaluation ?Consults called: ortho ?Admission status: inpatient,med surg   ? ?Past Medical History:  ?Diagnosis Date  ? Anxiety disorder, unspecified   ? Depression 1978  ? Diverticulosis of colon 04/2005  ? GERD  (gastroesophageal reflux disease)   ? Hypertension   ? Hyperthyroidism   ? Dr Tedd Sias  ? Impaired fasting glucose   ? Low TSH level   ? chronic low, but euthyroid  ? Microscopic hematuria   ? RBBB   ? Sleep disturbance   ? Subclinical hyperthyroidism   ? unspecified  ? Tibia fracture 05/2010  ? left  ? ?Past Surgical History:  ?Procedure Laterality Date  ? ABDOMINAL HYSTERECTOMY  1988  ? hysterectomy/ BSO for endometriosis  ? BREAST BIOPSY Right 1992  ? neg  ? BREAST EXCISIONAL BIOPSY Right 1988  ? benign per pt  ? KNEE ARTHROSCOPY  6/12  ? left--Dr Charlann Boxer  ? ?Social History:  reports that she has never smoked. She has been exposed to tobacco smoke. She has never used smokeless tobacco. She reports current alcohol use. No history on file for drug use. ? ?Allergies  ?Allergen Reactions  ? Augmentin [Amoxicillin-Pot Clavulanate] Other (See Comments)  ?  Pt cannot remember  ? Cephalexin   ?  REACTION: vaginal blisters ?Tolerates penicillins  ? Codeine Phosphate   ?  REACTION: vomiting  ? Promethazine Hcl   ?  REACTION: nerves  ? ?Family History  ?Problem Relation Age of Onset  ? Cancer Mother   ?     lung  ? Diabetes Mother   ? Depression Mother   ? Cancer Sister   ?     oral cancer  ? Depression Sister   ? Depression Sister   ? Stroke Other   ? Heart attack Other   ? Cancer Other   ? Dementia Other   ? Breast cancer Neg Hx   ? Thyroid disease Neg Hx   ? ?Family history: Family history reviewed and not pertinent ? ?Prior to Admission medications   ?Medication Sig Start Date End Date Taking? Authorizing Provider  ?aspirin EC 81 MG tablet Take 81 mg by mouth every other day.   Yes [provider]  ?calcium carbonate (OSCAL) 1500 (600 Ca) MG TABS tablet Take 600 mg of elemental calcium 2 (two) times daily by mouth.   Yes [provider]  ?cholecalciferol (VITAMIN D) 1000 UNITS tablet Take 2,000 Units by mouth daily.   Yes [provider]  ?clorazepate (TRANXENE) 3.75 MG tablet TAKE 1 TABLET BY MOUTH  3 TIMES DAILY AS NEEDED FOR ANXIETY. 11/18/21  Yes Tillman Abide I, MD  ?divalproex (DEPAKOTE) 125 MG DR tablet TAKE 1 TABLET BY MOUTH TWICE DAILY 11/18/21  Yes Karie Schwalbe, MD  ?latanoprost (XALATAN) 0.005 % ophthalmic solution Place 1 drop at bedtime into both eyes.  01/15/12  Yes [provider]  ?omeprazole (PRILOSEC) 20 MG capsule TAKE 1 CAPSULE BY MOUTH ONCE DAILY 03/02/21  Yes Karie Schwalbe, MD  ?timolol (TIMOPTIC) 0.5 % ophthalmic solution Place 1 drop daily into both eyes.   Yes [provider]  ?venlafaxine (EFFEXOR) 75 MG tablet TAKE 1 TABLET BY MOUTH TWICE DAILY 11/18/21  Yes Karie Schwalbe, MD  ?vitamin B-12 (CYANOCOBALAMIN) 1000 MCG tablet Take 1,000 mcg by mouth daily.   Yes [provider]  ?ibuprofen (ADVIL,MOTRIN) 200 MG tablet Take 200 mg by mouth every 6 (six) hours as  needed.    [provider]  ?meclizine (ANTIVERT) 25 MG tablet Take 25 mg 3 (three) times daily as needed by mouth for dizziness.    [provider]  ?mometasone (ELOCON) 0.1 % cream Apply 1 application topically 2 (two) times daily as needed (itching). 12/20/17   Karie SchwalbeLetvak, Richard I, MD  ?olopatadine (PATANOL) 0.1 % ophthalmic solution Place 1 drop 2 (two) times daily as needed into both eyes.  ?Patient not taking: Reported on 01/15/2022 09/16/12   Karie SchwalbeLetvak, Richard I, MD  ?Calcium Carbonate (CALCIUM 500 PO) Take one by mouth twice a day   07/17/19  [provider]  ? ?Physical Exam: ?Vitals:  ? 01/14/22 2236 01/14/22 2240 01/14/22 2245 01/14/22 2249  ?BP:   (!) 164/97   ?Pulse:   95   ?Resp:    18  ?Temp:  98.1 ?F (36.7 ?C)    ?TempSrc:  Oral    ?SpO2:   97%   ?Weight: 60.3 kg     ? ?Constitutional: appears younger than chronological age, NAD, calm, comfortable ?Eyes: PERRL, lids and conjunctivae normal ?ENMT: Mucous membranes are moist. Posterior pharynx clear of any exudate or lesions. Age-appropriate dentition. Hearing appropriate ?Neck: normal, supple, no masses, no  thyromegaly ?Respiratory: clear to auscultation bilaterally, no wheezing, no crackles. Normal respiratory effort. No accessory muscle use.  ?Cardiovascular: Regular rate and rhythm, no murmurs / rubs /

## 2022-01-15 NOTE — Op Note (Signed)
DATE OF SURGERY:  01/15/2022 ? ?TIME: 12:16 PM ? ?PATIENT NAME:  Alexandria Sherman ? ?AGE: 76 y.o. ? ?PRE-OPERATIVE DIAGNOSIS: Displaced intertrochanteric left hip Fracture ? ?POST-OPERATIVE DIAGNOSIS:  SAME ? ?PROCEDURE:  INTRAMEDULLARY (IM) NAIL INTERTROCHANTRIC left hip ? ?SURGEON:  Valinda Hoar ? ?ASST: ? ?EBL: 50 cc ? ?COMPLICATIONS: None ? ?OPERATIVE IMPLANTS: Synthes trochanteric femoral nail 135 degrees / 10 mm with interlocking helical blade 90  mm and distal locking screw 38 mm. ? ?PREOPERATIVE INDICATIONS:  Alexandria Sherman is a 76 y.o. year old who fell and suffered a hip fracture. She was brought into the ER and then admitted and optimized and then elected for surgical intervention.   ? ?The risks benefits and alternatives were discussed with the patient including but not limited to the risks of nonoperative treatment, versus surgical intervention including infection, bleeding, nerve injury, malunion, nonunion, hardware prominence, hardware failure, need for hardware removal, blood clots, cardiopulmonary complications, morbidity, mortality, among others, and they were willing to proceed.   ? ?OPERATIVE PROCEDURE: ? ?The patient was brought to the operating room and placed in the supine position.  Spinal plus general LMA anesthesia was administered, without a foley. She was placed on the fracture table.  Closed reduction was performed under C-arm guidance. The length of the femur was also measured using fluoroscopy. Time out was then performed after sterile prep and drape. She received preoperative antibiotics. ? ?Incision was made proximal to the greater trochanter. A guidewire was placed in the appropriate position. Confirmation was made on AP and lateral views. The above-named nail was opened. I opened the proximal femur with a reamer. I then placed the nail by hand easily down. I did not need to ream the femur. ? ?Once the nail was completely seated, I placed a guidepin into the femoral head into the  center center position through a second incision.  I measured the length, and then reamed the lateral cortex and up into the head. I then placed the helical blade. Slight compression was applied. Anatomic fixation achieved. Bone quality was mediocre. ? ?I then secured the proximal interlock.  The distal locking screw was then placed and after confirming the position of the fracture fragments and hardware I then removed the instruments, and took final C-arm pictures AP and lateral the entire length of the leg. Anatomic reconstruction was achieved, and the wounds were irrigated copiously and closed with Vicryl  followed by staples and dry sterile dressing. Sponge and needle count were correct.   The patient was awakened and returned to PACU in stable and satisfactory condition. There no complications and the patient tolerated the procedure well. ? ?She will be partial weightbearing as tolerated, and will be on Lovenox  For DVT prophylaxis.    ? ?Valinda Hoar, M.D.  ?

## 2022-01-16 ENCOUNTER — Encounter: Payer: Self-pay | Admitting: Specialist

## 2022-01-16 ENCOUNTER — Other Ambulatory Visit: Payer: Self-pay

## 2022-01-16 DIAGNOSIS — D62 Acute posthemorrhagic anemia: Secondary | ICD-10-CM | POA: Diagnosis not present

## 2022-01-16 DIAGNOSIS — S72142A Displaced intertrochanteric fracture of left femur, initial encounter for closed fracture: Secondary | ICD-10-CM | POA: Diagnosis not present

## 2022-01-16 DIAGNOSIS — I872 Venous insufficiency (chronic) (peripheral): Secondary | ICD-10-CM

## 2022-01-16 DIAGNOSIS — W19XXXA Unspecified fall, initial encounter: Secondary | ICD-10-CM | POA: Diagnosis not present

## 2022-01-16 LAB — BASIC METABOLIC PANEL
Anion gap: 4 — ABNORMAL LOW (ref 5–15)
BUN: 32 mg/dL — ABNORMAL HIGH (ref 8–23)
CO2: 26 mmol/L (ref 22–32)
Calcium: 8.6 mg/dL — ABNORMAL LOW (ref 8.9–10.3)
Chloride: 103 mmol/L (ref 98–111)
Creatinine, Ser: 0.82 mg/dL (ref 0.44–1.00)
GFR, Estimated: >60 mL/min (ref >60)
Glucose, Bld: 131 mg/dL — ABNORMAL HIGH (ref 70–99)
Potassium: 4.5 mmol/L (ref 3.5–5.1)
Sodium: 133 mmol/L — ABNORMAL LOW (ref 135–145)

## 2022-01-16 LAB — PREPARE RBC (CROSSMATCH)

## 2022-01-16 LAB — HEMOGLOBIN AND HEMATOCRIT, BLOOD
HCT: 27.4 % — ABNORMAL LOW (ref 36.0–46.0)
Hemoglobin: 8.8 g/dL — ABNORMAL LOW (ref 12.0–15.0)

## 2022-01-16 LAB — GLUCOSE, CAPILLARY
Glucose-Capillary: 114 mg/dL — ABNORMAL HIGH (ref 70–99)
Glucose-Capillary: 86 mg/dL (ref 70–99)
Glucose-Capillary: 97 mg/dL (ref 70–99)

## 2022-01-16 LAB — CBC
HCT: 22.6 % — ABNORMAL LOW (ref 36.0–46.0)
Hemoglobin: 7.1 g/dL — ABNORMAL LOW (ref 12.0–15.0)
MCH: 30.9 pg (ref 26.0–34.0)
MCHC: 31.4 g/dL (ref 30.0–36.0)
MCV: 98.3 fL (ref 80.0–100.0)
Platelets: 174 10*3/uL (ref 150–400)
RBC: 2.3 MIL/uL — ABNORMAL LOW (ref 3.87–5.11)
RDW: 12.8 % (ref 11.5–15.5)
WBC: 10.3 10*3/uL (ref 4.0–10.5)
nRBC: 0 % (ref 0.0–0.2)

## 2022-01-16 LAB — ABO/RH: ABO/RH(D): O POS

## 2022-01-16 MED ORDER — SODIUM CHLORIDE 0.9% IV SOLUTION: Freq: Once | INTRAVENOUS | Status: AC

## 2022-01-16 MED ORDER — ENSURE ENLIVE PO LIQD: 237.0000 mL | Freq: Two times a day (BID) | ORAL | Status: DC

## 2022-01-16 MED ORDER — ADULT MULTIVITAMIN W/MINERALS CH
1.0000 | ORAL_TABLET | Freq: Every day | ORAL | Status: DC
  Administered 2022-01-16 – 2022-01-18 (×3): 1 via ORAL
  Filled 2022-01-16 (×3): qty 1

## 2022-01-16 NOTE — Progress Notes (Signed)
Initial Nutrition Assessment ? ?DOCUMENTATION CODES:  ? ?Not applicable ? ?INTERVENTION:  ? ?-MVI with minerals daily ?-Ensure Enlive po BID, each supplement provides 350 kcal and 20 grams of protein ?-Feeding assistance with meals ? ?NUTRITION DIAGNOSIS:  ? ?Increased nutrient needs related to post-op healing as evidenced by estimated needs. ? ?GOAL:  ? ?Patient will meet greater than or equal to 90% of their needs ? ?MONITOR:  ? ?PO intake, Supplement acceptance, Labs, Weight trends, Skin, I & O's ? ?REASON FOR ASSESSMENT:  ? ?Consult ?Assessment of nutrition requirement/status, Hip fracture protocol ? ?ASSESSMENT:  ? ?[name] is a 76 year old female with history of anxiety, GERD, depression, who presents emergency department for chief concerns of a fall that was unwitnessed. ? ?Pt admitted with closed 2 part intertochanteric fracture of lt femur.  ? ?4/23- s/p PROCEDURE:  INTRAMEDULLARY (IM) NAIL INTERTROCHANTRIC left hip ? ?Reviewed I/O's: +3.3 L x 24 hours  ? ?UOP: 250 ml x 24 hours ? ?Spoke with pt and caregiver at bedside. Pt reports good appetite, but shares that she has difficulty opening condiments and containers due to limited dexterity in her hands. Pt consumes 3 meals per day, which usually consists of frozen dinners, leftovers, and easy to prepare items. Her caretaker often cooks her her and she has another friends who does her grocery shopping.  ?  ?Pt shares her UBW is around 150# and estimates she has lost about 10-20# over the past year. Reviewed wt hx; wt has been stable over the past month. Most muscle is loss is in lower extremities, which is likely related to limited mobility.  ? ?Discussed importance of good meal and supplement intake to promote healing. Pt amenable to Ensure.  ? ?Medications reviewed and include calcium carbonate, vitamin D, ferrous sulfate, senokot, and vitamin B-12.  ? ?Lab Results  ?Component Value Date  ? HGBA1C 5.7 11/22/2020  ? PTA DM medications are none.   ? ?Labs reviewed: Na: 133, CBGS: 114-151 (inpatient orders for glycemic control are none).   ? ?NUTRITION - FOCUSED PHYSICAL EXAM: ? ?Flowsheet Row Most Recent Value  ?Orbital Region Mild depletion  ?Upper Arm Region No depletion  ?Thoracic and Lumbar Region No depletion  ?Buccal Region No depletion  ?Temple Region Mild depletion  ?Clavicle Bone Region No depletion  ?Clavicle and Acromion Bone Region No depletion  ?Scapular Bone Region No depletion  ?Dorsal Hand Mild depletion  ?Patellar Region Mild depletion  ?Anterior Thigh Region Mild depletion  ?Posterior Calf Region Mild depletion  ?Edema (RD Assessment) None  ?Hair Reviewed  ?Eyes Reviewed  ?Mouth Reviewed  ?Skin Reviewed  ?Nails Reviewed  ? ?  ? ? ?Diet Order:   ?Diet Order   ? ?       ?  Diet regular Room service appropriate? Yes; Fluid consistency: Thin  Diet effective now       ?  ? ?  ?  ? ?  ? ? ?EDUCATION NEEDS:  ? ?Education needs have been addressed ? ?Skin:  Skin Assessment: Skin Integrity Issues: ?Skin Integrity Issues:: Incisions ?Incisions: closed lt hip ? ?Last BM:  01/14/22 ? ?Height:  ? ?Ht Readings from Last 1 Encounters:  ?01/15/22 5\' 5"  (1.651 m)  ? ? ?Weight:  ? ?Wt Readings from Last 1 Encounters:  ?01/15/22 60.3 kg  ? ? ?Ideal Body Weight:  56.8 kg ? ?BMI:  Body mass index is 22.13 kg/m?. ? ?Estimated Nutritional Needs:  ? ?Kcal:  1600-1800 ? ?Protein:  80-95 grams ? ?  Fluid:  > 1.6 L ? ? ? ?Loistine Chance, RD, LDN, CDCES ?Registered Dietitian II ?Certified Diabetes Care and Education Specialist ?Please refer to [facility] for RD and/or RD on-call/weekend/after hours pager  ?

## 2022-01-16 NOTE — Evaluation (Signed)
Physical Therapy Evaluation ?Patient Details ?Name: Alexandria Sherman ?MRN: [id number] ?DOB: 19-Feb-1946 ?Today's Date: 01/16/2022 ? ?History of Present Illness ? Pt is a 76 year old female with history of anxiety, GERD, HTN, chronic venous insufficiency of lower extremity, and depression who presents to the ED for chief concerns of a fall that was unwitnessed.  MD assessment includes displaced intertrochanteric left hip fracture now s/p IM nail. ?  ?Clinical Impression ? Pt anxious regarding mobility but willing to participate with encouragement. Pt required total assist with bed mobility tasks and even with heavy +2 assist was unable to clear the surface of the bed during transfer attempts.  Pt's anxiety and impulsiveness made following cues inconsistent at best with pt needing max multi-modal cuing throughout the session.  Pt will benefit from PT services in a SNF setting upon discharge to safely address deficits listed in patient problem list for decreased caregiver assistance and eventual return to PLOF. ?    ?   ? ?Recommendations for follow up therapy are one component of a multi-disciplinary discharge planning process, led by the attending physician.  Recommendations may be updated based on patient status, additional functional criteria and insurance authorization. ? ?Follow Up Recommendations Skilled nursing-short term rehab (<3 hours/day) ? ?  ?Assistance Recommended at Discharge Frequent or constant Supervision/Assistance  ?Patient can return home with the following ? Two people to help with walking and/or transfers;Two people to help with bathing/dressing/bathroom;Assistance with cooking/housework;Direct supervision/assist for medications management;Assist for transportation ? ?  ?Equipment Recommendations Other (comment) (TBD at next venue of care)  ?Recommendations for Other Services ?    ?  ?Functional Status Assessment Patient has had a recent decline in their functional status and/or demonstrates limited  ability to make significant improvements in function in a reasonable and predictable amount of time  ? ?  ?Precautions / Restrictions Precautions ?Precautions: Fall ?Restrictions ?Weight Bearing Restrictions: Yes ?LLE Weight Bearing: Partial weight bearing ?LLE Partial Weight Bearing Percentage or Pounds: 50%  ? ?  ? ?Mobility ? Bed Mobility ?Overal bed mobility: Needs Assistance ?Bed Mobility: Supine to Sit, Sit to Supine ?  ?  ?Supine to sit: Max assist ?Sit to supine: Max assist ?  ?General bed mobility comments: Max A for BLE and trunk control ?  ? ?Transfers ?  ?  ?  ?  ?  ?  ?  ?  ?  ?General transfer comment: Pt highly anxious during transfer attempts with difficulty following commands, unable to clear the surface of the bed even with heavy +2 assist and max cuing for sequencing ?  ? ?Ambulation/Gait ?  ?  ?  ?  ?  ?  ?  ?General Gait Details: Unable/unsafe to attempt ? ?Stairs ?  ?  ?  ?  ?  ? ?Wheelchair Mobility ?  ? ?Modified Rankin (Stroke Patients Only) ?  ? ?  ? ?Balance Overall balance assessment: Needs assistance ?Sitting-balance support: Bilateral upper extremity supported, Feet supported ?Sitting balance-Leahy Scale: Fair ?Sitting balance - Comments: R lateral lean to unweight her L hip ?  ?  ?  ?  ?  ?  ?  ?  ?  ?  ?  ?  ?  ?  ?  ?   ? ? ? ?Pertinent Vitals/Pain Pain Assessment ?Pain Assessment: 0-10 ?Pain Score: 6  ?Pain Location: L hip with movement only, 0/10 at rest ?Pain Descriptors / Indicators: Sore, Aching ?Pain Intervention(s): Premedicated before session, Repositioned, Monitored during session  ? ? ?Home Living  Family/patient expects to be discharged to:: Private residence ?Living Arrangements: Alone ?Available Help at Discharge: Family;Available PRN/intermittently ?Type of Home: House ?Home Access: Level entry ?  ?  ?Alternate Level Stairs-Number of Steps: Pt has a stair glider chair lift for ascending and descending steps to the 2nd story ?Home Layout: Two level;Bed/bath upstairs ?Home  Equipment: Shower seat;Grab bars - tub/shower ?   ?  ?Prior Function Prior Level of Function : Independent/Modified Independent ?  ?  ?  ?  ?  ?  ?Mobility Comments: Mod Ind amb limited community distances with a rollator, two falls in the last 6 months secondary to LOB ?ADLs Comments: Ind with ADLs, has a house cleaner and tries to shower while the cleaner is in the home for safety ?  ? ? ?Hand Dominance  ?   ? ?  ?Extremity/Trunk Assessment  ? Upper Extremity Assessment ?Upper Extremity Assessment: Generalized weakness ?  ? ?Lower Extremity Assessment ?Lower Extremity Assessment: Generalized weakness;LLE deficits/detail ?LLE: Unable to fully assess due to pain ?  ? ?   ?Communication  ? Communication: No difficulties  ?Cognition Arousal/Alertness: Awake/alert ?Behavior During Therapy: Anxious, Impulsive ?Overall Cognitive Status: No family/caregiver present to determine baseline cognitive functioning ?  ?  ?  ?  ?  ?  ?  ?  ?  ?  ?  ?  ?  ?  ?  ?  ?  ?  ?  ? ?  ?General Comments   ? ?  ?Exercises Other Exercises ?Other Exercises: Anterior weight shifting activities in sitting to facilitate transfers ?Other Exercises: Pt education provided on physiological benefits of activity  ? ?Assessment/Plan  ?  ?PT Assessment Patient needs continued PT services  ?PT Problem List Decreased strength;Decreased activity tolerance;Decreased balance;Decreased mobility;Decreased knowledge of use of DME;Pain;Decreased knowledge of precautions ? ?   ?  ?PT Treatment Interventions DME instruction;Gait training;Functional mobility training;Therapeutic activities;Therapeutic exercise;Balance training;Patient/family education   ? ?PT Goals (Current goals can be found in the Care Plan section)  ?Acute Rehab PT Goals ?Patient Stated Goal: To get stronger and return home ?PT Goal Formulation: With patient ?Time For Goal Achievement: 01/29/22 ?Potential to Achieve Goals: Fair ? ?  ?Frequency 7X/week ?  ? ? ?Co-evaluation   ?  ?  ?  ?  ? ? ?   ?AM-PAC PT "6 Clicks" Mobility  ?Outcome Measure Help needed turning from your back to your side while in a flat bed without using bedrails?: Total ?Help needed moving from lying on your back to sitting on the side of a flat bed without using bedrails?: Total ?Help needed moving to and from a bed to a chair (including a wheelchair)?: Total ?Help needed standing up from a chair using your arms (e.g., wheelchair or bedside chair)?: Total ?Help needed to walk in hospital room?: Total ?Help needed climbing 3-5 steps with a railing? : Total ?6 Click Score: 6 ? ?  ?End of Session Equipment Utilized During Treatment: Gait belt ?Activity Tolerance: Patient limited by pain ?Patient left: in bed;with call bell/phone within reach;with bed alarm set ?Nurse Communication: Mobility status ?PT Visit Diagnosis: Unsteadiness on feet (R26.81);History of falling (Z91.81);Other abnormalities of gait and mobility (R26.89);Muscle weakness (generalized) (M62.81);Pain ?Pain - Right/Left: Left ?Pain - part of body: Hip ?  ? ?Time: [id number] ?PT Time Calculation (min) (ACUTE ONLY): 40 min ? ? ?Charges:   PT Evaluation ?$PT Eval Moderate Complexity: 1 Mod ?PT Treatments ?$Therapeutic Activity: 8-22 mins ?  ?   ? ? ?  Linus Salmons PT, DPT ?01/16/22, 5:08 PM ? ? ?

## 2022-01-16 NOTE — Progress Notes (Signed)
[place] at [facility] ? ? ?PATIENT NAME: Alexandria Sherman   ? ?MR#:  [id number] ? ?DATE OF BIRTH:  1946-06-04 ? ?SUBJECTIVE:  ? ? ? ? ?VITALS:  ?Blood pressure (!) 130/100, pulse 92, temperature (!) 97.3 ?F (36.3 ?C), temperature source Oral, resp. rate 17, height 5\' 5"  (1.651 m), weight 60.3 kg, SpO2 95 %. ? ?PHYSICAL EXAMINATION:  ? ?GENERAL:  76 y.o.-year-old patient lying in the bed with no acute distress.  ?LUNGS: Normal breath sounds bilaterally, no wheezing, rales, rhonchi.  ?CARDIOVASCULAR: S1, S2 normal. No murmurs, rubs, or gallops.  ?ABDOMEN: Soft, nontender, nondistended. Bowel sounds present.  ?EXTREMITIES: No  edema b/l.    ?NEUROLOGIC: nonfocal  patient is alert and awake ?SKIN: No obvious rash, lesion, or ulcer.  ? ?LABORATORY PANEL:  ?CBC ?Recent Labs  ?Lab 01/16/22 ?0420  ?WBC 10.3  ?HGB 7.1*  ?HCT 22.6*  ?PLT 174  ? ? ?Chemistries  ?Recent Labs  ?Lab 01/16/22 ?0420  ?NA 133*  ?K 4.5  ?CL 103  ?CO2 26  ?GLUCOSE 131*  ?BUN 32*  ?CREATININE 0.82  ?CALCIUM 8.6*  ? ?Cardiac Enzymes ?No results for input(s): TROPONINI in the last 168 hours. ?RADIOLOGY:  ?DG Chest 1 View ? ?Result Date: 01/15/2022 ?CLINICAL DATA:  Fall EXAM: CHEST  1 VIEW COMPARISON:  None. FINDINGS: Lungs are clear. Eventration of the right hemidiaphragm. No pleural effusion or pneumothorax. The heart is normal in size. IMPRESSION: No evidence of acute cardiopulmonary disease. Electronically Signed   By: Julian Hy M.D.   On: 01/15/2022 00:03  ? ?CT Head Wo Contrast ? ?Result Date: 01/14/2022 ?CLINICAL DATA:  Head trauma.  Mechanical fall admitted. EXAM: CT HEAD WITHOUT CONTRAST TECHNIQUE: Contiguous axial images were obtained from the base of the skull through the vertex without intravenous contrast. RADIATION DOSE REDUCTION: This exam was performed according to the departmental dose-optimization program which includes automated exposure control, adjustment of the mA and/or kV according to patient size  and/or use of iterative reconstruction technique. COMPARISON:  MRI examination dated April 17, 2013 FINDINGS: Brain: No evidence of acute infarction, hemorrhage, hydrocephalus, extra-axial collection or mass lesion/mass effect. Low-attenuation of the periventricular and subcortical white matter presumed chronic microvascular ischemic changes. Cerebral atrophy. Vascular: No hyperdense vessel or unexpected calcification. Skull: Normal. Negative for fracture or focal lesion. Sinuses/Orbits: No acute finding. Other: None. IMPRESSION: 1. No acute intracranial abnormality. 2. Cerebral atrophy and chronic microvascular ischemic changes of the white matter. Electronically Signed   By: Keane Police D.O.   On: 01/14/2022 23:33  ? ?DG C-Arm 1-60 Min-No Report ? ?Result Date: 01/15/2022 ?Fluoroscopy was utilized by the requesting physician.  No radiographic interpretation.  ? ?DG HIP UNILAT WITH PELVIS 2-3 VIEWS LEFT ? ?Result Date: 01/15/2022 ?CLINICAL DATA:  Provided history: Left short IM nail. EXAM: DG HIP (WITH OR WITHOUT PELVIS) 2-3V LEFT COMPARISON:  Radiographs of the left hip 01/14/2022. FINDINGS: Ten intraoperative fluoroscopic images of the left hip are submitted. On the provided images, there are findings of interval placement of an IM nail within the proximal left femur. A proximal interlocking component extends into the left femoral head/neck. An interlocking screw is also present more distally. The hardware traverses a known acute intertrochanteric left hip fracture. No unexpected finding on the provided views. IMPRESSION: Ten intraoperative fluoroscopic images of the left hip, as described. Electronically Signed   By: Kellie Simmering D.O.   On: 01/15/2022 16:33  ? ?DG Hip Unilat W or Wo Pelvis  2-3 Views Left ? ?Result Date: 01/15/2022 ?CLINICAL DATA:  Fall, pain EXAM: DG HIP (WITH OR WITHOUT PELVIS) 2-3V LEFT COMPARISON:  None. FINDINGS: Intertrochanteric left hip fracture with foreshortening and varus angulation.  Visualized bony pelvis appears intact. Bilateral hip joint spaces are preserved Surgical clips overlying the left pelvis. IMPRESSION: Intertrochanteric left hip fracture, as above. Electronically Signed   By: Julian Hy M.D.   On: 01/15/2022 00:02   ? ?Assessment and Plan ?[name] is a 76 year old female with history of anxiety, GERD, depression, who presents emergency department for chief concerns of a fall that was unwitnessed.She states she was bending over to pick up a water bottle. She states at baseline she uses a rollator. She lives by herself.  ?Patient had denied loss of consciousness. ? ?Closed  intertrochanteric fracture of left femur, initial encounter ([place]) ?Post-op anemia ?--POD #1 ?- prn Pain meds ?--PT/OT to see ?- Orthopedic, Dr. Sabra Heck has been consulted. ?-- hgb 11-->7.1--1 unit BT today--hgb later today ?--TOC for d/c planning ? ?Chronic venous insufficiency of lower extremity ?At baseline, per patient ?  ?Major depression in remission [facility]) ?- Resumed home Depakote  ?  ?GERD ?- PPI ?  ?Episodic mood disorder ([place]) ?- Resumed home Tranxene/clorazepate  as needed for anxiety, venlafaxine qd ? ? ? ? ? ? ?Procedures: INTRAMEDULLARY (IM) NAIL INTERTROCHANTRIC left hip ? SURGEON:  Park Breed ?Family communication :BIL Hedy Camara ?Consults :orthopedic ?CODE STATUS: FULL ?DVT Prophylaxis :lovenox ?Level of care: Med-Surg ?Status is: Inpatient ?Remains inpatient appropriate because: postop day one. Patient getting blood transfusion today. PT/OT eval  ?TOC for discharge planning ?  ? ?TOTAL TIME TAKING CARE OF THIS PATIENT: 35 minutes.  ?>50% time spent on counselling and coordination of care ? ?Note: This dictation was prepared with Dragon dictation along with smaller phrase technology. Any transcriptional errors that result from this process are unintentional. ? ?Fritzi Mandes M.D  ? ? ?[facility]  ? ?CC: ?Primary care physician; Venia Carbon, MD  ?

## 2022-01-16 NOTE — Progress Notes (Signed)
PT Cancellation Note ? ?Patient Details ?Name: Alexandria Sherman ?MRN: [id number] ?DOB: Mar 16, 1946 ? ? ?Cancelled Treatment:    Reason Eval/Treat Not Completed: Medical issues which prohibited therapy: Per nursing preparing to initiate transfusion this AM, will hold PT eval until PM.  ? ? ?D. Elly Modena PT, DPT ?01/16/22, 8:53 AM ? ?

## 2022-01-17 DIAGNOSIS — I5032 Chronic diastolic (congestive) heart failure: Secondary | ICD-10-CM | POA: Diagnosis present

## 2022-01-17 DIAGNOSIS — N179 Acute kidney failure, unspecified: Secondary | ICD-10-CM | POA: Diagnosis not present

## 2022-01-17 DIAGNOSIS — D62 Acute posthemorrhagic anemia: Secondary | ICD-10-CM | POA: Diagnosis not present

## 2022-01-17 DIAGNOSIS — S72142A Displaced intertrochanteric fracture of left femur, initial encounter for closed fracture: Secondary | ICD-10-CM | POA: Diagnosis not present

## 2022-01-17 LAB — BASIC METABOLIC PANEL
Anion gap: 4 — ABNORMAL LOW (ref 5–15)
BUN: 48 mg/dL — ABNORMAL HIGH (ref 8–23)
CO2: 27 mmol/L (ref 22–32)
Calcium: 9.4 mg/dL (ref 8.9–10.3)
Chloride: 99 mmol/L (ref 98–111)
Creatinine, Ser: 1.34 mg/dL — ABNORMAL HIGH (ref 0.44–1.00)
GFR, Estimated: 41 mL/min — ABNORMAL LOW (ref >60)
Glucose, Bld: 112 mg/dL — ABNORMAL HIGH (ref 70–99)
Potassium: 4.4 mmol/L (ref 3.5–5.1)
Sodium: 130 mmol/L — ABNORMAL LOW (ref 135–145)

## 2022-01-17 LAB — TYPE AND SCREEN
ABO/RH(D): O POS
Antibody Screen: NEGATIVE
Unit division: 0

## 2022-01-17 LAB — BPAM RBC
Blood Product Expiration Date: 202305252359
ISSUE DATE / TIME: 202304240948
Unit Type and Rh: 5100

## 2022-01-17 LAB — CBC
HCT: 23.8 % — ABNORMAL LOW (ref 36.0–46.0)
Hemoglobin: 7.7 g/dL — ABNORMAL LOW (ref 12.0–15.0)
MCH: 29.7 pg (ref 26.0–34.0)
MCHC: 32.4 g/dL (ref 30.0–36.0)
MCV: 91.9 fL (ref 80.0–100.0)
Platelets: 164 10*3/uL (ref 150–400)
RBC: 2.59 MIL/uL — ABNORMAL LOW (ref 3.87–5.11)
RDW: 16.2 % — ABNORMAL HIGH (ref 11.5–15.5)
WBC: 9.7 10*3/uL (ref 4.0–10.5)
nRBC: 0 % (ref 0.0–0.2)

## 2022-01-17 LAB — BRAIN NATRIURETIC PEPTIDE: B Natriuretic Peptide: 82.5 pg/mL (ref 0.0–100.0)

## 2022-01-17 MED ORDER — ASPIRIN EC 81 MG PO TBEC
81.0000 mg | DELAYED_RELEASE_TABLET | Freq: Two times a day (BID) | ORAL | Status: DC
  Administered 2022-01-17 – 2022-01-18 (×3): 81 mg via ORAL
  Filled 2022-01-17 (×3): qty 1

## 2022-01-17 MED ORDER — DIPHENHYDRAMINE HCL 25 MG PO CAPS: 25.0000 mg | ORAL_CAPSULE | Freq: Once | ORAL | Status: DC

## 2022-01-17 MED ORDER — SODIUM CHLORIDE 0.9% IV SOLUTION: INTRAVENOUS | Status: DC

## 2022-01-17 MED ORDER — DIPHENHYDRAMINE HCL 25 MG PO CAPS: 25.0000 mg | ORAL_CAPSULE | Freq: Four times a day (QID) | ORAL | Status: DC | PRN

## 2022-01-17 NOTE — Assessment & Plan Note (Signed)
Echocardiogram in May 2020 notes mild diastolic dysfunction.  Getting IV fluids now given acute kidney injury.  Checking BNP in the morning. ?

## 2022-01-17 NOTE — Evaluation (Signed)
Occupational Therapy Evaluation ?Patient Details ?Name: Alexandria Sherman ?MRN: [id number] ?DOB: 1945/09/28 ?Today's Date: 01/17/2022 ? ? ?History of Present Illness Pt is a 76 year old female with history of anxiety, GERD, HTN, chronic venous insufficiency of lower extremity, and depression who presents to the ED for chief concerns of a fall that was unwitnessed.  MD assessment includes displaced intertrochanteric left hip fracture now s/p IM nail.  ? ?Clinical Impression ?  ?Alexandria Sherman was seen for OT evaluation this date. Prior to hospital admission, pt was MOD I using 4WW for mobility ands ADLs. Pt lives alone. Pt presents to acute OT demonstrating impaired ADL performance and functional mobility 2/2 decreased activity tolerance and functional strength/ROM/balance deficits. Pt currently requires MAX A don/doff socks at bed level. MOD A sup>sit. MIN A grooming seated EOB - heavy R lateral lean 2/2 pain, cues to maintain upright posture. Pt reports 8/10 pain, pt medicated at start of session, unable to trial standing at this time. Pt would benefit from skilled OT to address noted impairments and functional limitations (see below for any additional details). Upon hospital discharge, recommend STR to maximize pt safety and return to PLOF.   ? ?Recommendations for follow up therapy are one component of a multi-disciplinary discharge planning process, led by the attending physician.  Recommendations may be updated based on patient status, additional functional criteria and insurance authorization.  ? ?Follow Up Recommendations ? Skilled nursing-short term rehab (<3 hours/day)  ?  ?Assistance Recommended at Discharge Frequent or constant Supervision/Assistance  ?Patient can return home with the following Two people to help with walking and/or transfers;Two people to help with bathing/dressing/bathroom ? ?  ?Functional Status Assessment ? Patient has had a recent decline in their functional status and demonstrates the  ability to make significant improvements in function in a reasonable and predictable amount of time.  ?Equipment Recommendations ? Other (comment) (defer to next venue of care)  ?  ?Recommendations for Other Services   ? ? ?  ?Precautions / Restrictions Precautions ?Precautions: Fall ?Restrictions ?Weight Bearing Restrictions: Yes ?LLE Weight Bearing: Partial weight bearing ?LLE Partial Weight Bearing Percentage or Pounds: 50  ? ?  ? ?Mobility Bed Mobility ?Overal bed mobility: Needs Assistance ?Bed Mobility: Supine to Sit, Sit to Supine ?  ?  ?Supine to sit: Mod assist ?Sit to supine: Max assist ?  ?  ?  ? ?Transfers ?  ?  ?  ?  ?  ?  ?  ?  ?  ?General transfer comment: pt refused to attempt - ?  ? ?  ?Balance Overall balance assessment: Needs assistance ?Sitting-balance support: Bilateral upper extremity supported, Feet supported ?Sitting balance-Leahy Scale: Fair ?Sitting balance - Comments: R lateral lean to unweight her L hip ?Postural control: Right lateral lean ?  ?  ?  ?  ?  ?  ?  ?  ?  ?  ?  ?  ?  ?  ?   ? ?ADL either performed or assessed with clinical judgement  ? ?ADL Overall ADL's : Needs assistance/impaired ?  ?  ?  ?  ?  ?  ?  ?  ?  ?  ?  ?  ?  ?  ?  ?  ?  ?  ?  ?General ADL Comments: MAX A don/doff socks at bed level. MIN A grooming seated EOB - heavy R lateral lean 2/2 pain, cues to maintain upright posture.  ? ? ? ? ?Pertinent Vitals/Pain Pain Assessment ?Pain Assessment: 0-10 ?  Pain Score: 8  ?Pain Location: L hip ?Pain Descriptors / Indicators: Sore, Aching ?Pain Intervention(s): Limited activity within patient's tolerance, RN gave pain meds during session  ? ? ? ?Hand Dominance   ?  ?Extremity/Trunk Assessment Upper Extremity Assessment ?Upper Extremity Assessment: Generalized weakness ?  ?Lower Extremity Assessment ?Lower Extremity Assessment: Generalized weakness ?  ?  ?  ?Communication Communication ?Communication: No difficulties ?  ?Cognition Arousal/Alertness: Awake/alert ?Behavior During  Therapy: Anxious, Impulsive ?Overall Cognitive Status: No family/caregiver present to determine baseline cognitive functioning ?  ?  ?  ?  ?  ?  ?  ?  ?  ?  ?  ?  ?  ?  ?  ?  ?  ?  ?  ?   ?   ?   ? ? ?Home Living Family/patient expects to be discharged to:: Private residence ?Living Arrangements: Alone ?Available Help at Discharge: Family;Available PRN/intermittently ?Type of Home: House ?Home Access: Level entry ?  ?  ?Home Layout: Two level;Bed/bath upstairs ?Alternate Level Stairs-Number of Steps: Pt has a stair glider chair lift for ascending and descending steps to the 2nd story ?  ?Bathroom Shower/Tub: Walk-in shower ?  ?  ?  ?  ?Home Equipment: Shower seat;Grab bars - tub/shower ?  ?  ?  ? ?  ?Prior Functioning/Environment Prior Level of Function : Independent/Modified Independent ?  ?  ?  ?  ?  ?  ?Mobility Comments: Mod Ind amb limited community distances with a rollator, two falls in the last 6 months secondary to LOB ?ADLs Comments: Ind with ADLs, has a house cleaner and tries to shower while the cleaner is in the home for safety ?  ? ?  ?  ?OT Problem List: Decreased strength;Decreased activity tolerance;Decreased range of motion;Impaired balance (sitting and/or standing);Decreased safety awareness;Pain ?  ?   ?OT Treatment/Interventions: Self-care/ADL training;Therapeutic exercise;Energy conservation;DME and/or AE instruction;Therapeutic activities;Balance training;Patient/family education  ?  ?OT Goals(Current goals can be found in the care plan section) Acute Rehab OT Goals ?Patient Stated Goal: to go home ?OT Goal Formulation: With patient ?Time For Goal Achievement: 01/31/22 ?Potential to Achieve Goals: Good ?ADL Goals ?Pt Will Perform Grooming: with supervision;sitting;with set-up ?Pt Will Perform Lower Body Dressing: sitting/lateral leans;with min assist ?Pt Will Transfer to Toilet: with mod assist;with transfer board;bedside commode  ?OT Frequency: Min 2X/week ?  ? ?Co-evaluation   ?  ?  ?  ?   ? ?  ?AM-PAC OT "6 Clicks" Daily Activity     ?Outcome Measure Help from another person eating meals?: None ?Help from another person taking care of personal grooming?: A Little ?Help from another person toileting, which includes using toliet, bedpan, or urinal?: A Lot ?Help from another person bathing (including washing, rinsing, drying)?: A Lot ?Help from another person to put on and taking off regular upper body clothing?: A Lot ?Help from another person to put on and taking off regular lower body clothing?: A Lot ?6 Click Score: 15 ?  ?End of Session Nurse Communication: Mobility status ? ?Activity Tolerance: Patient limited by pain ?Patient left: in bed;with call bell/phone within reach;with bed alarm set ? ?OT Visit Diagnosis: Other abnormalities of gait and mobility (R26.89);Muscle weakness (generalized) (M62.81)  ?              ?Time: [id number] ?OT Time Calculation (min): 19 min ?Charges:  OT General Charges ?$OT Visit: 1 Visit ?OT Evaluation ?$OT Eval Low Complexity: 1 Low ?OT Treatments ?$Self Care/Home Management :  8-22 mins ? ?Dessie Coma, M.S. OTR/L  ?01/17/22, 12:39 PM  ?ascom [phone] ? ?

## 2022-01-17 NOTE — Progress Notes (Signed)
Subjective: ?2 Days Post-Op Procedure(s) (LRB): ?INTRAMEDULLARY (IM) NAIL INTERTROCHANTRIC (Left) ?  ?Patient was seen night late last evening failed to placed a note in the chart.  She was doing well and was alert and awake and in only moderate pain.  Hemoglobin was 7.1 yesterday morning and she received 1 unit of packed red blood cells.  We will initiate more aggressive mobility and physical therapy today. ?Patient reports pain as moderate. ? ?Objective:  ? ?VITALS:   ?Vitals:  ? 01/16/22 2130 01/17/22 0417  ?BP:  128/71  ?Pulse: 97 93  ?Resp:  18  ?Temp:  97.7 ?F (36.5 ?C)  ?SpO2: 92% (!) 84%  ? ? ?Neurologically intact ? ?LABS ?Recent Labs  ?  01/15/22 ?0204 01/16/22 ?0420 01/16/22 ?1434 01/17/22 ?0243  ?HGB 11.3* 7.1* 8.8* 7.7*  ?HCT 36.4 22.6* 27.4* 23.8*  ?WBC 12.2* 10.3  --  9.7  ?PLT 209 174  --  164  ? ? ?Recent Labs  ?  01/15/22 ?0204 01/16/22 ?0420 01/17/22 ?0243  ?NA 139 133* 130*  ?K 3.9 4.5 4.4  ?BUN 26* 32* 48*  ?CREATININE 0.46 0.82 1.34*  ?GLUCOSE 132* 131* 112*  ? ? ?Recent Labs  ?  01/15/22 ?0040  ?INR 1.0  ? ? ? ?Assessment/Plan: ?2 Days Post-Op Procedure(s) (LRB): ?INTRAMEDULLARY (IM) NAIL INTERTROCHANTRIC (Left) ? ? ?Advance diet ?Up with therapy ?Discharge to SNF ? ?

## 2022-01-17 NOTE — Assessment & Plan Note (Signed)
Deconditioned.  Needs skilled nursing. ?

## 2022-01-17 NOTE — [place] FL2 (Signed)
?[place] MEDICAID FL2 LEVEL OF CARE SCREENING TOOL  ?  ? ?IDENTIFICATION  ?Patient Name: ?Alexandria Sherman Birthdate: [birth year] Sex: female Admission Date (Current Location): ?01/14/2022  ?[place] Number: ? [place] ?  Facility and Address:  ?[facility], [address], [place], [place] [address] ?     Provider Number: ?[id number]  ?Attending Physician Name and Address:  ?Hollice Espy, MD ? Relative Name and Phone Number:  ?Simona Huh [phone] ?   ?Current Level of Care: ?Hospital Recommended Level of Care: ?Skilled Nursing Facility Prior Approval Number: ?  ? ?Date Approved/Denied: ?  PASRR Number: ?  ? ?Discharge Plan: ?SNF ?  ? ?Current Diagnoses: ?Patient Active Problem List  ? Diagnosis Date Noted  ? Acute postoperative anemia due to expected blood loss   ? Fall   ? Closed 2-part intertrochanteric fracture of left femur, initial encounter (HCC) 01/15/2022  ? Chronic venous insufficiency of lower extremity 01/15/2022  ? Multifactorial functional impairment 12/06/2021  ? Chronic venous insufficiency 11/10/2019  ? Sarcopenia 01/21/2018  ? Acquired trigger finger 09/13/2017  ? Edema extremities 08/07/2017  ? Major depression in remission (HCC) 08/07/2017  ? Advance directive discussed with patient 10/13/2014  ? Female stress incontinence 04/02/2013  ? Neuropathy (HCC) 08/01/2012  ? Routine general medical examination at a health care facility 05/09/2011  ? Episodic mood disorder (HCC) 12/19/2006  ? Essential hypertension, benign 12/19/2006  ? RBBB 12/19/2006  ? ALLERGIC RHINITIS 12/19/2006  ? GERD 12/19/2006  ? DIVERTICULOSIS, COLON 12/19/2006  ? ? ?Orientation RESPIRATION BLADDER Height & Weight   ?  ?Self, Time, Situation, Place ? Normal Continent, External catheter Weight: 60.3 kg ?Height:  5\' 5"  (165.1 cm)  ?BEHAVIORAL SYMPTOMS/MOOD NEUROLOGICAL BOWEL NUTRITION STATUS  ?    Continent Diet (see dc summary)  ?AMBULATORY STATUS COMMUNICATION OF NEEDS Skin   ?Extensive  Assist Verbally Normal, Surgical wounds ?  ?  ?  ?    ?     ?     ? ? ?Personal Care Assistance Level of Assistance  ?Bathing, Feeding, Dressing Bathing Assistance: Limited assistance ?Feeding assistance: Independent ?Dressing Assistance: Maximum assistance ?   ? ?Functional Limitations Info  ?    ?  ?   ? ? ?SPECIAL CARE FACTORS FREQUENCY  ?PT (By licensed PT), OT (By licensed OT)   ?  ?PT Frequency: 5 times per week ?OT Frequency: 5 times per week ?  ?  ?  ?   ? ? ?Contractures Contractures Info: Not present  ? ? ?Additional Factors Info  ?Code Status, Allergies Code Status Info: full code ?Allergies Info: Augmentin (Amoxicillin-pot Clavulanate), Cephalexin, Codeine Phosphate, Promethazine Hcl ?  ?  ?  ?   ? ?Current Medications (01/17/2022):  This is the current hospital active medication list ?Current Facility-Administered Medications  ?Medication Dose Route Frequency Provider Last Rate Last Admin  ? 0.45 % sodium chloride infusion   Intravenous Continuous 01/19/2022, MD 75 mL/hr at 01/15/22 1539 New Bag at 01/15/22 1539  ? acetaminophen (TYLENOL) tablet 650 mg  650 mg Oral Q6H PRN 01/17/22, MD      ? Or  ? acetaminophen (TYLENOL) suppository 650 mg  650 mg Rectal Q6H PRN Deeann Saint, MD      ? acetaminophen (TYLENOL) tablet 325-650 mg  325-650 mg Oral Q6H PRN Deeann Saint, MD      ? alum & mag hydroxide-simeth (MAALOX/MYLANTA) 200-200-20 MG/5ML suspension 30 mL  30 mL Oral Q4H PRN 04-03-2001,  MD      ? bisacodyl (DULCOLAX) suppository 10 mg  10 mg Rectal Daily PRN Deeann Saint, MD      ? calcium carbonate (OS-CAL - dosed in mg of elemental calcium) tablet 500 mg of elemental calcium  500 mg of elemental calcium Oral BID WC Deeann Saint, MD   500 mg of elemental calcium at 01/16/22 1647  ? cholecalciferol (VITAMIN D3) tablet 2,000 Units  2,000 Units Oral Daily Deeann Saint, MD   2,000 Units at 01/16/22 [phone]  ? clorazepate (TRANXENE) tablet 3.75 mg  3.75 mg Oral TID PRN Deeann Saint, MD    3.75 mg at 01/16/22 2140  ? divalproex (DEPAKOTE) DR tablet 125 mg  125 mg Oral BID Deeann Saint, MD   125 mg at 01/16/22 2057  ? feeding supplement (ENSURE ENLIVE / ENSURE PLUS) liquid 237 mL  237 mL Oral BID BM Enedina Finner, MD      ? ferrous sulfate tablet 325 mg  325 mg Oral Q breakfast Deeann Saint, MD   325 mg at 01/16/22 1740  ? HYDROcodone-acetaminophen (NORCO) 7.5-325 MG per tablet 1-2 tablet  1-2 tablet Oral Q4H PRN Deeann Saint, MD      ? HYDROcodone-acetaminophen (NORCO/VICODIN) 5-325 MG per tablet 1-2 tablet  1-2 tablet Oral Q6H PRN Deeann Saint, MD   2 tablet at 01/15/22 0137  ? HYDROcodone-acetaminophen (NORCO/VICODIN) 5-325 MG per tablet 1-2 tablet  1-2 tablet Oral Q4H PRN Deeann Saint, MD   2 tablet at 01/16/22 1647  ? latanoprost (XALATAN) 0.005 % ophthalmic solution 1 drop  1 drop Both Eyes QHS Deeann Saint, MD   1 drop at 01/16/22 2058  ? magnesium hydroxide (MILK OF MAGNESIA) suspension 30 mL  30 mL Oral Daily PRN Deeann Saint, MD   30 mL at 01/16/22 0837  ? meclizine (ANTIVERT) tablet 25 mg  25 mg Oral TID PRN Deeann Saint, MD      ? menthol-cetylpyridinium (CEPACOL) lozenge 3 mg  1 lozenge Oral PRN Deeann Saint, MD      ? Or  ? phenol (CHLORASEPTIC) mouth spray 1 spray  1 spray Mouth/Throat PRN Deeann Saint, MD      ? metoCLOPramide (REGLAN) tablet 5-10 mg  5-10 mg Oral Q8H PRN Deeann Saint, MD      ? Or  ? metoCLOPramide (REGLAN) injection 5-10 mg  5-10 mg Intravenous Q8H PRN Deeann Saint, MD      ? morphine (PF) 2 MG/ML injection 0.5-1 mg  0.5-1 mg Intravenous Q2H PRN Deeann Saint, MD      ? multivitamin with minerals tablet 1 tablet  1 tablet Oral Daily Enedina Finner, MD   1 tablet at 01/16/22 1647  ? ondansetron (ZOFRAN) tablet 4 mg  4 mg Oral Q6H PRN Deeann Saint, MD      ? Or  ? ondansetron [facility]) injection 4 mg  4 mg Intravenous Q6H PRN Deeann Saint, MD   4 mg at 01/16/22 1006  ? pantoprazole (PROTONIX) EC tablet 40 mg  40 mg Oral Daily Deeann Saint, MD    40 mg at 01/16/22 8144  ? polyethylene glycol (MIRALAX / GLYCOLAX) packet 17 g  17 g Oral Daily PRN Deeann Saint, MD      ? senna Mancel Parsons) tablet 8.6 mg  1 tablet Oral BID Deeann Saint, MD   8.6 mg at 01/16/22 2057  ? sodium phosphate (FLEET) 7-19 GM/118ML enema 1 enema  1 enema Rectal Once PRN Deeann Saint, MD      ?  timolol (TIMOPTIC) 0.5 % ophthalmic solution 1 drop  1 drop Both Eyes Daily Deeann SaintMiller, Howard, MD   1 drop at 01/16/22 0839  ? venlafaxine (EFFEXOR) tablet 75 mg  75 mg Oral BID Deeann SaintMiller, Howard, MD   75 mg at 01/16/22 2058  ? vitamin B-12 (CYANOCOBALAMIN) tablet 1,000 mcg  1,000 mcg Oral Daily Deeann SaintMiller, Howard, MD   1,000 mcg at 01/16/22 [id number]  ? zolpidem (AMBIEN) tablet 5 mg  5 mg Oral QHS PRN Deeann SaintMiller, Howard, MD   5 mg at 01/15/22 2153  ? ? ? ?Discharge Medications: ?Please see discharge summary for a list of discharge medications. ? ?Relevant Imaging Results: ? ?Relevant Lab Results: ? ? ?Additional Information ?SS# [id number] ? ?Marlowe Saxeliliah J Makenzie Weisner, RN ? ? ? ? ?

## 2022-01-17 NOTE — Progress Notes (Signed)
Subjective: ?2 Days Post-Op Procedure(s) (LRB): ?INTRAMEDULLARY (IM) NAIL INTERTROCHANTRIC (Left) ?  ?Patient is alert and oriented this morning.  Pain is lessened.  Dressing remains dry.  Hemoglobin is 7.7.  Plan to mobilize her more today. ? ?Patient reports pain as mild. ? ?Objective:  ? ?VITALS:   ?Vitals:  ? 01/17/22 0417 01/17/22 0836  ?BP: 128/71 (!) 128/57  ?Pulse: 93 88  ?Resp: 18 18  ?Temp: 97.7 ?F (36.5 ?C) 98.3 ?F (36.8 ?C)  ?SpO2: (!) 84% 91%  ? ? ?Neurologically intact ?Incision: dressing C/D/I ? ?LABS ?Recent Labs  ?  01/15/22 ?0204 01/16/22 ?0420 01/16/22 ?1434 01/17/22 ?0243  ?HGB 11.3* 7.1* 8.8* 7.7*  ?HCT 36.4 22.6* 27.4* 23.8*  ?WBC 12.2* 10.3  --  9.7  ?PLT 209 174  --  164  ? ? ?Recent Labs  ?  01/15/22 ?0204 01/16/22 ?0420 01/17/22 ?0243  ?NA 139 133* 130*  ?K 3.9 4.5 4.4  ?BUN 26* 32* 48*  ?CREATININE 0.46 0.82 1.34*  ?GLUCOSE 132* 131* 112*  ? ? ?Recent Labs  ?  01/15/22 ?0040  ?INR 1.0  ? ? ? ?Assessment/Plan: ?2 Days Post-Op Procedure(s) (LRB): ?INTRAMEDULLARY (IM) NAIL INTERTROCHANTRIC (Left) ? ? ?Advance diet ?Up with therapy ?Discharge to SNF ? ?Start aspirin today for DVT prophylaxis. ?Progress PT today. ?She will need skilled nursing treatment since she lives alone. ?Return to clinic in 2 weeks after discharge for x-rays and staple removal. ?She may be 50% weightbearing on the leg. ? ? ? ? ?

## 2022-01-17 NOTE — Assessment & Plan Note (Signed)
Secondary dehydration versus worsening blood loss.  Have started IV fluids to hydrate. ?

## 2022-01-17 NOTE — Assessment & Plan Note (Addendum)
Suspect patient's initial hemoglobin of 11.3 may be hemoconcentrated, however a month ago was at 13.  Some of this is also from blood loss from surgery.  With transfusion up to 8.8, but back down to 7.7 and now with some mild acute kidney injury.  This AM was down to 7.1 this was likely dilutaionl effect d/t fluids to treat AKI, repeat CBC later same day showed increase back to 7.7 from 7.1 01/18/22.  ?

## 2022-01-17 NOTE — Progress Notes (Signed)
Met with the patient at the bedside ?She is agreeable to go to [facility], She prefers [facility], I explained they may be full, she is open to look at other facilities if needed, Bedsearch sent, FL2 completed ?

## 2022-01-17 NOTE — Progress Notes (Signed)
[facility] Progress Note ? ?Patient: Alexandria Sherman    FWY:[id number]  DOA: 01/14/2022    ?Date of Service: the patient was seen and examined on 01/17/2022 ? ?Brief hospital course: ?[name] is a 76 year old female with history of anxiety, GERD, depression, who presented to the emergency room on 4/23 after having an unwitnessed fall.  Patient found to have a left-sided intertrochanteric fracture.  Seen by orthopedic surgery and taken that same day for IM nail.  Hemoglobin on day of presentation 11.3 and on 11/24, at 7.1.  Patient received 1 unit of blood and hemoglobin up to 8.8. ? ?This morning, hemoglobin down to 7.7 with mild increase in creatinine at 1.34.  Patient herself denies any hip pain. ? ?Assessment and Plan: ?Assessment and Plan: ?* Closed 2-part intertrochanteric fracture of left femur, initial encounter (HCC) ?Status post left IM nail.  Case management working on skilled nursing facility short-term.  Started on aspirin for DVT prophylaxis.  Follow-up in orthopedics in 2 weeks for x-rays and staple removal.  Patient allowed to be 50% weightbearing on that leg. ? ?Acute postoperative anemia due to expected blood loss ?Suspect patient's initial hemoglobin of 11.3 may be hemoconcentrated, however a month ago was at 13.  Some of this is also from blood loss from surgery.  With transfusion up to 8.8, but back down to 7.7 and now with some mild acute kidney injury.  Hydrating and will recheck hemoglobin in the morning. ? ?AKI (acute kidney injury) (HCC) ?Secondary dehydration versus worsening blood loss.  Have started IV fluids to hydrate. ? ?Fall ?Deconditioned.  Needs skilled nursing. ? ?Chronic diastolic CHF (congestive heart failure) (HCC) ?Echocardiogram in May 2020 notes mild diastolic dysfunction.  Getting IV fluids now given acute kidney injury.  Checking BNP in the morning. ? ?Essential hypertension, benign ?Blood pressures have been stable. ? ?Chronic venous insufficiency of lower  extremity ?At baseline, per patient ? ?Major depression in remission [facility]) ?- Resumed home Depakote 125 mg p.o. twice daily ? ?Episodic mood disorder (HCC) ?- Resumed home Tranxene/clorazepate 3.175 mg p.o. 3 times daily as needed for anxiety, venlafaxine 75 mg p.o. twice daily ? ?GERD ?- PPI ? ? ? ? ? ? ?Body mass index is 22.13 kg/m?Marland Kitchen  ?Nutrition Problem: Increased nutrient needs ?Etiology: post-op healing ?   ? ?Consultants: ?Orthopedic surgery ? ?Procedures: ?Status post IM nail done 4/23 ?Status post 1 unit packed red blood cell transfusion 4/24 ? ?Antimicrobials: ?Preop Ancef ? ?Code Status: Full code ? ? ?Subjective: Patient doing okay, no complaints ? ?Objective: ?Noted some mild tachycardia ?Vitals:  ? 01/17/22 0836 01/17/22 1605  ?BP: (!) 128/57 (!) 114/97  ?Pulse: 88 (!) 110  ?Resp: 18   ?Temp: 98.3 ?F (36.8 ?C) 97.7 ?F (36.5 ?C)  ?SpO2: 91% 97%  ? ? ?Intake/Output Summary (Last 24 hours) at 01/17/2022 1627 ?Last data filed at 01/17/2022 1524 ?Gross per 24 hour  ?Intake 540 ml  ?Output 400 ml  ?Net 140 ml  ? ?Filed Weights  ? 01/14/22 2236 01/15/22 0326  ?Weight: 60.3 kg 60.3 kg  ? ?Body mass index is 22.13 kg/m?. ? ?Exam: ? ?General: Alert and oriented x3, no acute distress ?HEENT: Normocephalic, atraumatic, mucous membranes are slightly dry ?Cardiovascular: Regular rate and rhythm, S1-S2 ?Respiratory: Clear to auscultation bilaterally ?Abdomen: Soft, nontender, nondistended, positive bowel sounds ?Musculoskeletal: No clubbing or cyanosis, trace pitting edema ?Skin: Surgical site noted.  No skin breaks, tears or lesions otherwise ?Psychiatry: Appropriate, no evidence of psychoses ?Neurology:  No focal deficits ? ?Data Reviewed: ?Noted development of mild acute kidney injury with creatinine of 1.34.  Hemoglobin dropped from 8.8 down to 7.7. ? ?Disposition:  ?Status is: Inpatient ?Remains inpatient appropriate because: Needs skilled nursing placement ?  ? ?Anticipated discharge date: 4/26 or 4/27 ? ?Remaining  issues to be resolved so that patient can be discharged: Stabilization of hemoglobin ? ? ?Family Communication: Left message for family ?DVT Prophylaxis: ?SCDs Start: 01/15/22 1441 ?Place TED hose Start: 01/15/22 0047 ? ? ? ?Author: ?Hollice Espy ,MD ?01/17/2022 4:27 PM ? ?To reach On-call, see care teams to locate the attending and reach out via [email or web address]. ?Between 7PM-7AM, please contact night-coverage ?If you still have difficulty reaching the attending provider, please page the [facility] (Director on Call) for [facility] on amion for assistance. ? ?

## 2022-01-17 NOTE — Assessment & Plan Note (Signed)
Blood pressures have been stable. ?

## 2022-01-17 NOTE — TOC Progression Note (Signed)
Transition of Care (TOC) - Progression Note  ? ? ?Patient Details  ?Name: Alexandria Sherman ?MRN: [id number] ?Date of Birth: [birth year] ? ?Transition of Care (TOC) CM/SW Contact  ?Conception Oms, RN ?Phone Number: ?01/17/2022, 1:11 PM ? ?Clinical Narrative:    ? ?Met with the patient and explained that [facility] does not accept her Ins, Reviewed the other bed offers with her and the [employer], she chose [facility], I notified [place] at [facility] ?Ins pending ? ?Expected Discharge Plan: [place] ?Barriers to Discharge: Continued Medical Work up ? ?Expected Discharge Plan and Services ?Expected Discharge Plan: [place] ?  ?  ?  ?Living arrangements for the past 2 months: [place] ?                ?  ?  ?  ?  ?  ?  ?  ?  ?  ?  ? ? ?Social Determinants of Health (SDOH) Interventions ?  ? ?Readmission Risk Interventions ?   ? View : No data to display.  ?  ?  ?  ? ? ?

## 2022-01-17 NOTE — Progress Notes (Signed)
Physical Therapy Treatment ?Patient Details ?Name: Alexandria Sherman ?MRN: [id number] ?DOB: [birth year] ?Today's Date: 01/17/2022 ? ? ?History of Present Illness Pt is a 76 year old female with history of anxiety, GERD, HTN, chronic venous insufficiency of lower extremity, and depression who presents to the ED for chief concerns of a fall that was unwitnessed.  MD assessment includes displaced intertrochanteric left hip fracture now s/p IM nail. ? ?  ?PT Comments  ? ? Pt anxious but with encouragement put forth good effort during the session.  Pt continue to require heavy assistance with bed mobility tasks but was able to come to standing this session with holding onto the sink bowl for support and with +2 mod A.  Pt required max cuing for proper sequencing and to prevent posterior lean when anxious during transfer training.  Pt was able to remain in standing at the EOB around 60 to 90 sec with heavy lean on the counter for support but without physical assistance. Pt cued to attempt a controlled descend to the bed during stand to sit but demonstrated very poor eccentric control.  Pt will benefit from PT services in a SNF setting upon discharge to safely address deficits listed in patient problem list for decreased caregiver assistance and eventual return to PLOF. ?  ?Recommendations for follow up therapy are one component of a multi-disciplinary discharge planning process, led by the attending physician.  Recommendations may be updated based on patient status, additional functional criteria and insurance authorization. ? ?Follow Up Recommendations ? Skilled nursing-short term rehab (<3 hours/day) ?  ?  ?Assistance Recommended at Discharge Frequent or constant Supervision/Assistance  ?Patient can return home with the following Two people to help with walking and/or transfers;Two people to help with bathing/dressing/bathroom;Assistance with cooking/housework;Direct supervision/assist for medications management;Assist for  transportation ?  ?Equipment Recommendations ? Other (comment) (TBD at next venue of care)  ?  ?Recommendations for Other Services   ? ? ?  ?Precautions / Restrictions Precautions ?Precautions: Fall ?Restrictions ?Weight Bearing Restrictions: Yes ?LLE Weight Bearing: Partial weight bearing ?LLE Partial Weight Bearing Percentage or Pounds: 50%  ?  ? ?Mobility ? Bed Mobility ?Overal bed mobility: Needs Assistance ?Bed Mobility: Supine to Sit, Sit to Supine ?  ?  ?Supine to sit: Max assist ?Sit to supine: Max assist ?  ?General bed mobility comments: Max A for BLE and trunk control ?  ? ?Transfers ?Overall transfer level: Needs assistance ?Equipment used:  (held sink bowl at [employer]) ?Transfers: Sit to/from Stand ?Sit to Stand: Mod assist, +2 physical assistance ?  ?  ?  ?  ?  ?General transfer comment: Max multi-modal cuing for foot and hand placement and for increased trunk flexion; poor eccentric control back to surface of bed ?  ? ?Ambulation/Gait ?  ?  ?  ?  ?  ?  ?  ?General Gait Details: Unable/unsafe to attempt ? ? ?Stairs ?  ?  ?  ?  ?  ? ? ?Wheelchair Mobility ?  ? ?Modified Rankin (Stroke Patients Only) ?  ? ? ?  ?Balance Overall balance assessment: Needs assistance, History of Falls ?Sitting-balance support: Bilateral upper extremity supported, Feet supported ?Sitting balance-Leahy Scale: Fair ?Sitting balance - Comments: R lateral lean to unweight her L hip ?  ?  ?Standing balance-Leahy Scale: Poor ?  ?  ?  ?  ?  ?  ?  ?  ?  ?  ?  ?  ?  ? ?  ?Cognition Arousal/Alertness: Awake/alert ?Behavior During Therapy:  Anxious, Impulsive ?Overall Cognitive Status: No family/caregiver present to determine baseline cognitive functioning ?  ?  ?  ?  ?  ?  ?  ?  ?  ?  ?  ?  ?  ?  ?  ?  ?  ?  ?  ? ?  ?Exercises Total Joint Exercises ?Ankle Circles/Pumps: Strengthening, Both, 10 reps (with manual resistance) ?Quad Sets: Strengthening, Both, 5 reps, 10 reps ?Gluteal Sets: Strengthening, Both, 10 reps ?Other Exercises ?Other  Exercises: Anterior weight shifting activities in sitting to facilitate transfers ?Other Exercises: Pt education provided on physiological benefits of weight bearing activities ? ?  ?General Comments   ?  ?  ? ?Pertinent Vitals/Pain Pain Assessment ?Pain Assessment: 0-10 ?Pain Score: 10-Worst pain ever ?Pain Location: L hip; 0/10 pain at rest and 10/10 pain with movement ?Pain Descriptors / Indicators: Sore, Aching ?Pain Intervention(s): Repositioned, Premedicated before session, Monitored during session  ? ? ?Home Living   ?  ?  ?  ?  ?  ?  ?  ?  ?  ?   ?  ?Prior Function    ?  ?  ?   ? ?PT Goals (current goals can now be found in the care plan section) Progress towards PT goals: Progressing toward goals ? ?  ?Frequency ? ? ? 7X/week ? ? ? ?  ?PT Plan Current plan remains appropriate  ? ? ?Co-evaluation   ?  ?  ?  ?  ? ?  ?AM-PAC PT "6 Clicks" Mobility   ?Outcome Measure ? Help needed turning from your back to your side while in a flat bed without using bedrails?: Total ?Help needed moving from lying on your back to sitting on the side of a flat bed without using bedrails?: Total ?Help needed moving to and from a bed to a chair (including a wheelchair)?: Total ?Help needed standing up from a chair using your arms (e.g., wheelchair or bedside chair)?: Total ?Help needed to walk in hospital room?: Total ?Help needed climbing 3-5 steps with a railing? : Total ?6 Click Score: 6 ? ?  ?End of Session Equipment Utilized During Treatment: Gait belt ?Activity Tolerance: Patient tolerated treatment well ?Patient left: in bed;with call bell/phone within reach;with bed alarm set;with nursing/sitter in room ?Nurse Communication: Mobility status ?PT Visit Diagnosis: Unsteadiness on feet (R26.81);History of falling (Z91.81);Other abnormalities of gait and mobility (R26.89);Muscle weakness (generalized) (M62.81);Pain ?Pain - Right/Left: Left ?Pain - part of body: Hip ?  ? ? ?Time: 3149-7026 ?PT Time Calculation (min) (ACUTE ONLY):  29 min ? ?Charges:  $Therapeutic Exercise: 8-22 mins ?$Therapeutic Activity: 8-22 mins          ?          ? ?D. Elly Modena PT, DPT ?01/17/22, 4:04 PM ? ? ?

## 2022-01-18 DIAGNOSIS — R5381 Other malaise: Secondary | ICD-10-CM | POA: Diagnosis not present

## 2022-01-18 DIAGNOSIS — F339 Major depressive disorder, recurrent, unspecified: Secondary | ICD-10-CM | POA: Diagnosis not present

## 2022-01-18 DIAGNOSIS — F325 Major depressive disorder, single episode, in full remission: Secondary | ICD-10-CM | POA: Diagnosis not present

## 2022-01-18 DIAGNOSIS — M6281 Muscle weakness (generalized): Secondary | ICD-10-CM | POA: Diagnosis not present

## 2022-01-18 DIAGNOSIS — I5032 Chronic diastolic (congestive) heart failure: Secondary | ICD-10-CM | POA: Diagnosis not present

## 2022-01-18 DIAGNOSIS — F411 Generalized anxiety disorder: Secondary | ICD-10-CM | POA: Diagnosis not present

## 2022-01-18 DIAGNOSIS — D62 Acute posthemorrhagic anemia: Secondary | ICD-10-CM | POA: Diagnosis not present

## 2022-01-18 DIAGNOSIS — K219 Gastro-esophageal reflux disease without esophagitis: Secondary | ICD-10-CM | POA: Diagnosis not present

## 2022-01-18 DIAGNOSIS — F39 Unspecified mood [affective] disorder: Secondary | ICD-10-CM | POA: Diagnosis not present

## 2022-01-18 DIAGNOSIS — S72142A Displaced intertrochanteric fracture of left femur, initial encounter for closed fracture: Secondary | ICD-10-CM | POA: Diagnosis not present

## 2022-01-18 DIAGNOSIS — I5033 Acute on chronic diastolic (congestive) heart failure: Secondary | ICD-10-CM | POA: Diagnosis not present

## 2022-01-18 DIAGNOSIS — I110 Hypertensive heart disease with heart failure: Secondary | ICD-10-CM | POA: Diagnosis not present

## 2022-01-18 DIAGNOSIS — K5904 Chronic idiopathic constipation: Secondary | ICD-10-CM | POA: Diagnosis not present

## 2022-01-18 DIAGNOSIS — W19XXXD Unspecified fall, subsequent encounter: Secondary | ICD-10-CM | POA: Diagnosis not present

## 2022-01-18 DIAGNOSIS — F0390 Unspecified dementia without behavioral disturbance: Secondary | ICD-10-CM | POA: Diagnosis not present

## 2022-01-18 DIAGNOSIS — N179 Acute kidney failure, unspecified: Secondary | ICD-10-CM | POA: Diagnosis not present

## 2022-01-18 DIAGNOSIS — I872 Venous insufficiency (chronic) (peripheral): Secondary | ICD-10-CM | POA: Diagnosis not present

## 2022-01-18 DIAGNOSIS — Z7401 Bed confinement status: Secondary | ICD-10-CM | POA: Diagnosis not present

## 2022-01-18 DIAGNOSIS — R296 Repeated falls: Secondary | ICD-10-CM | POA: Diagnosis not present

## 2022-01-18 DIAGNOSIS — S72142D Displaced intertrochanteric fracture of left femur, subsequent encounter for closed fracture with routine healing: Secondary | ICD-10-CM | POA: Diagnosis not present

## 2022-01-18 LAB — CBC
HCT: 22.2 % — ABNORMAL LOW (ref 36.0–46.0)
HCT: 23.8 % — ABNORMAL LOW (ref 36.0–46.0)
Hemoglobin: 7.1 g/dL — ABNORMAL LOW (ref 12.0–15.0)
Hemoglobin: 7.7 g/dL — ABNORMAL LOW (ref 12.0–15.0)
MCH: 29.3 pg (ref 26.0–34.0)
MCH: 30 pg (ref 26.0–34.0)
MCHC: 32 g/dL (ref 30.0–36.0)
MCHC: 32.4 g/dL (ref 30.0–36.0)
MCV: 91.7 fL (ref 80.0–100.0)
MCV: 92.6 fL (ref 80.0–100.0)
Platelets: 178 10*3/uL (ref 150–400)
Platelets: 182 10*3/uL (ref 150–400)
RBC: 2.42 MIL/uL — ABNORMAL LOW (ref 3.87–5.11)
RBC: 2.57 MIL/uL — ABNORMAL LOW (ref 3.87–5.11)
RDW: 15.7 % — ABNORMAL HIGH (ref 11.5–15.5)
RDW: 15.5 % (ref 11.5–15.5)
WBC: 9 10*3/uL (ref 4.0–10.5)
WBC: 8.3 10*3/uL (ref 4.0–10.5)
nRBC: 0 % (ref 0.0–0.2)
nRBC: 0 % (ref 0.0–0.2)

## 2022-01-18 LAB — BASIC METABOLIC PANEL
Anion gap: 4 — ABNORMAL LOW (ref 5–15)
BUN: 31 mg/dL — ABNORMAL HIGH (ref 8–23)
CO2: 30 mmol/L (ref 22–32)
Calcium: 9.7 mg/dL (ref 8.9–10.3)
Chloride: 101 mmol/L (ref 98–111)
Creatinine, Ser: 0.38 mg/dL — ABNORMAL LOW (ref 0.44–1.00)
GFR, Estimated: >60 mL/min (ref >60)
Glucose, Bld: 102 mg/dL — ABNORMAL HIGH (ref 70–99)
Potassium: 4.1 mmol/L (ref 3.5–5.1)
Sodium: 135 mmol/L (ref 135–145)

## 2022-01-18 MED ORDER — HYDROCODONE-ACETAMINOPHEN 5-325 MG PO TABS: 1.0000 | ORAL_TABLET | Freq: Four times a day (QID) | ORAL | 0 refills | Status: DC | PRN

## 2022-01-18 MED ORDER — FERROUS SULFATE 325 (65 FE) MG PO TABS: 325.0000 mg | ORAL_TABLET | Freq: Every day | ORAL | 0 refills | Status: DC

## 2022-01-18 MED ORDER — ALUM & MAG HYDROXIDE-SIMETH 200-200-20 MG/5ML PO SUSP: 30.0000 mL | ORAL | 0 refills | Status: DC | PRN

## 2022-01-18 MED ORDER — SENNA 8.6 MG PO TABS: 1.0000 | ORAL_TABLET | Freq: Two times a day (BID) | ORAL | 0 refills | Status: DC

## 2022-01-18 MED ORDER — ACETAMINOPHEN 325 MG PO TABS: 325.0000 mg | ORAL_TABLET | Freq: Four times a day (QID) | ORAL | 0 refills | Status: DC | PRN

## 2022-01-18 MED ORDER — POLYETHYLENE GLYCOL 3350 17 G PO PACK: 17.0000 g | PACK | Freq: Every day | ORAL | 0 refills | Status: DC | PRN

## 2022-01-18 MED ORDER — BISACODYL 10 MG RE SUPP: 10.0000 mg | Freq: Every day | RECTAL | 0 refills | Status: DC | PRN

## 2022-01-18 MED ORDER — ENSURE ENLIVE PO LIQD: 237.0000 mL | Freq: Two times a day (BID) | ORAL | 12 refills | Status: DC

## 2022-01-18 MED ORDER — ASPIRIN 81 MG PO TBEC: 81.0000 mg | DELAYED_RELEASE_TABLET | Freq: Two times a day (BID) | ORAL | 11 refills | Status: DC

## 2022-01-18 NOTE — Care Management Important Message (Signed)
Important Message ? ?Patient Details  ?Name: Alexandria Sherman ?MRN: [id number] ?Date of Birth: [birth year] ? ? ?Medicare Important Message Given:  Yes ? ? ? ? ?Olegario Messier A Adetokunbo Mccadden ?01/18/2022, 11:47 AM ?

## 2022-01-18 NOTE — TOC Progression Note (Signed)
Transition of Care (TOC) - Progression Note  ? ? ?Patient Details  ?Name: KRISTEL DURKEE ?MRN: [id number] ?Date of Birth: 13-Feb-1946 ? ?Transition of Care (TOC) CM/SW Contact  ?Marlowe Sax, RN ?Phone Number: ?01/18/2022, 9:49 AM ? ?Clinical Narrative:    ?Insurance approved to go to [facility] ?[id number] Au at [facility] is aware ? ? ?Expected Discharge Plan: Skilled Nursing Facility ?Barriers to Discharge: Continued Medical Work up ? ?Expected Discharge Plan and Services ?Expected Discharge Plan: Skilled Nursing Facility ?  ?  ?  ?Living arrangements for the past 2 months: Single Family Home ?                ?  ?  ?  ?  ?  ?  ?  ?  ?  ?  ? ? ?Social Determinants of Health (SDOH) Interventions ?  ? ?Readmission Risk Interventions ?   ? View : No data to display.  ?  ?  ?  ? ? ?

## 2022-01-18 NOTE — TOC Progression Note (Addendum)
Transition of Care (TOC) - Progression Note  ? ? ?Patient Details  ?Name: BROOKLIN RIEGER ?MRN: [id number] ?Date of Birth: 08/04/1946 ? ?Transition of Care (TOC) CM/SW Contact  ?Marlowe Sax, RN ?Phone Number: ?01/18/2022, 2:15 PM ? ?Clinical Narrative:    ? ?Patient going to [facility] ?EMS to transport she is next on list ?Family aware ?Expected Discharge Plan: Skilled Nursing Facility ?Barriers to Discharge: Continued Medical Work up ? ?Expected Discharge Plan and Services ?Expected Discharge Plan: Skilled Nursing Facility ?  ?  ?  ?Living arrangements for the past 2 months: Single Family Home ?Expected Discharge Date: 01/18/22               ?  ?  ?  ?  ?  ?  ?  ?  ?  ?  ? ? ?Social Determinants of Health (SDOH) Interventions ?  ? ?Readmission Risk Interventions ?   ? View : No data to display.  ?  ?  ?  ? ? ?

## 2022-01-18 NOTE — Progress Notes (Addendum)
1442 ?Report given to stephanie scott at [facility]. All questions and concerns answered. Had BM today. Gown and linen changed. VSS Brief applied. IV removed ? ?1503 ?Pressure foam dressing removed. X2 incision with staples CDI. Aquacell dressing applied to L hip. Pt tolerated ? ?1642 ?Report given to transport at this time. VSS. Pt ready for d/c ?

## 2022-01-18 NOTE — Discharge Summary (Signed)
?Physician Discharge Summary ?  ?Patient: Alexandria Sherman MRN: [id number] DOB: Mar 08, 1946  ?Admit date:     01/14/2022  ?Discharge date: 01/18/22  ?Discharge Physician: Sunnie Nielsen  ? ?PCP: Karie Schwalbe, MD  ? ?Recommendations at discharge:  ? ? Post-op anemia requiring transfusion, please check CBC in 2-3 days from admission to SNF  ?PT per protocol ?Follow w/ orthopedics in 2 weeks  ? ?Discharge Diagnoses: ?Principal Problem: ?  Closed 2-part intertrochanteric fracture of left femur, initial encounter (HCC) ?Active Problems: ?  Acute postoperative anemia due to expected blood loss ?  AKI (acute kidney injury) (HCC) ?  Fall ?  Chronic diastolic CHF (congestive heart failure) (HCC) ?  Essential hypertension, benign ?  Chronic venous insufficiency of lower extremity ?  Major depression in remission [facility]) ?  Episodic mood disorder (HCC) ?  GERD ? ? ?Hospital Course: ?Alexandria Sherman is a 76 year old female with history of anxiety, GERD, depression, who presented to the emergency room on 4/23 after having an unwitnessed fall.  Patient found to have a left-sided intertrochanteric fracture.  Seen by orthopedic surgery and taken that same day for IM nail.  Hemoglobin on day of presentation 11.3 and on 11/24, at 7.1.  Patient received 1 unit of blood and hemoglobin up to 8.8, down to 7.7 then 7.1 but this was likely dilutaionl effect, repeat CBC later same day showed increase back to 7.7 from 7.1 01/18/22.  ? ? ?Assessment and Plan: ?* Closed 2-part intertrochanteric fracture of left femur, initial encounter (HCC) ?Status post left IM nail.  Case management working on skilled nursing facility short-term.  Started on aspirin for DVT prophylaxis.  Follow-up in orthopedics in 2 weeks for x-rays and staple removal.  Patient allowed to be 50% weightbearing on that leg. ? ?Acute postoperative anemia due to expected blood loss ?Suspect patient's initial hemoglobin of 11.3 may be hemoconcentrated, however a month ago  was at 13.  Some of this is also from blood loss from surgery.  With transfusion up to 8.8, but back down to 7.7 and now with some mild acute kidney injury.  This AM was down to 7.1 this was likely dilutaionl effect d/t fluids to treat AKI, repeat CBC later same day showed increase back to 7.7 from 7.1 01/18/22.  ? ?AKI (acute kidney injury) (HCC) ?Secondary dehydration versus worsening blood loss.  Have started IV fluids to hydrate. ? ?Fall ?Deconditioned.  Needs skilled nursing. ? ?Chronic diastolic CHF (congestive heart failure) (HCC) ?Echocardiogram in May 2020 notes mild diastolic dysfunction.  Getting IV fluids now given acute kidney injury.  Checking BNP in the morning. ? ?Essential hypertension, benign ?Blood pressures have been stable. ? ?Chronic venous insufficiency of lower extremity ?At baseline, per patient ? ?Major depression in remission [facility]) ?- Resumed home Depakote 125 mg p.o. twice daily ? ?Episodic mood disorder (HCC) ?- Resumed home Tranxene/clorazepate 3.175 mg p.o. 3 times daily as needed for anxiety, venlafaxine 75 mg p.o. twice daily ? ?GERD ?- PPI ? ? ? ? ?  ? ?Pain control - [employer] Controlled Substance Reporting System database was reviewed. and patient was instructed, not to drive, operate heavy machinery, perform activities at heights, swimming or participation in water activities or provide baby-sitting services while on Pain, Sleep and Anxiety Medications; until their outpatient Physician has advised to do so again. Also recommended to not to take more than prescribed Pain, Sleep and Anxiety Medications.  ?Consultants: orthopedics  ?Procedures performed: INTRAMEDULLARY (IM) NAIL INTERTROCHANTRIC (Left)  01/15/22 ?Disposition: Skilled nursing facility ?Diet recommendation:  ?Cardiac diet ?DISCHARGE MEDICATION: ? ?Allergies as of 01/18/2022   ? ?   Reactions  ? Augmentin [amoxicillin-pot Clavulanate] Other (See Comments)  ? Pt cannot remember  ? Cephalexin   ? REACTION: vaginal  blisters ?Tolerates penicillins  ? Codeine Phosphate   ? REACTION: vomiting  ? Promethazine Hcl   ? REACTION: nerves  ? ?  ? ?  ?Medication List  ?  ? ?STOP taking these medications   ? ?meclizine 25 MG tablet ?Commonly known as: ANTIVERT ?  ?olopatadine 0.1 % ophthalmic solution ?Commonly known as: PATANOL ?  ? ?  ? ?TAKE these medications   ? ?acetaminophen 325 MG tablet ?Commonly known as: TYLENOL ?Take 1-2 tablets (325-650 mg total) by mouth every 6 (six) hours as needed for mild pain (pain score 1-3 or temp > 100.5). ?  ?alum & mag hydroxide-simeth 200-200-20 MG/5ML suspension ?Commonly known as: MAALOX/MYLANTA ?Take 30 mLs by mouth every 4 (four) hours as needed for indigestion. ?  ?aspirin 81 MG EC tablet ?Take 1 tablet (81 mg total) by mouth 2 (two) times daily. Swallow whole. ?What changed:  ?when to take this ?additional instructions ?  ?bisacodyl 10 MG suppository ?Commonly known as: DULCOLAX ?Place 1 suppository (10 mg total) rectally daily as needed for moderate constipation. ?  ?calcium carbonate 1500 (600 Ca) MG Tabs tablet ?Commonly known as: OSCAL ?Take 600 mg of elemental calcium 2 (two) times daily by mouth. ?  ?cholecalciferol 1000 units tablet ?Commonly known as: VITAMIN D ?Take 2,000 Units by mouth daily. ?  ?clorazepate 3.75 MG tablet ?Commonly known as: TRANXENE ?TAKE 1 TABLET BY MOUTH 3 TIMES DAILY AS NEEDED FOR ANXIETY. ?  ?divalproex 125 MG DR tablet ?Commonly known as: DEPAKOTE ?TAKE 1 TABLET BY MOUTH TWICE DAILY ?  ?feeding supplement Liqd ?Take 237 mLs by mouth 2 (two) times daily between meals. ?  ?ferrous sulfate 325 (65 FE) MG tablet ?Take 1 tablet (325 mg total) by mouth daily with breakfast. ?Start taking on: January 19, 2022 ?  ?HYDROcodone-acetaminophen 5-325 MG tablet ?Commonly known as: NORCO/VICODIN ?Take 1-2 tablets by mouth every 6 (six) hours as needed for moderate pain. ?  ?ibuprofen 200 MG tablet ?Commonly known as: ADVIL ?Take 200 mg by mouth every 6 (six) hours as  needed. ?  ?latanoprost 0.005 % ophthalmic solution ?Commonly known as: XALATAN ?Place 1 drop at bedtime into both eyes. ?  ?mometasone 0.1 % cream ?Commonly known as: ELOCON ?Apply 1 application topically 2 (two) times daily as needed (itching). ?  ?omeprazole 20 MG capsule ?Commonly known as: PRILOSEC ?TAKE 1 CAPSULE BY MOUTH ONCE DAILY ?  ?polyethylene glycol 17 g packet ?Commonly known as: MIRALAX / GLYCOLAX ?Take 17 g by mouth daily as needed for mild constipation. ?  ?senna 8.6 MG Tabs tablet ?Commonly known as: SENOKOT ?Take 1 tablet (8.6 mg total) by mouth 2 (two) times daily. ?  ?timolol 0.5 % ophthalmic solution ?Commonly known as: TIMOPTIC ?Place 1 drop daily into both eyes. ?  ?venlafaxine 75 MG tablet ?Commonly known as: EFFEXOR ?TAKE 1 TABLET BY MOUTH TWICE DAILY ?  ?vitamin B-12 1000 MCG tablet ?Commonly known as: CYANOCOBALAMIN ?Take 1,000 mcg by mouth daily. ?  ? ?  ? ? ? Contact information for after-discharge care   ? ? Destination   ? ? [facility] Preferred SNF .   ?Service: Skilled Nursing ?Contact information: ?[address] ?[place] [id number] ?[phone] ? ?  ?  ? ?  ?  ? ?  ?  ? ?  ? ?Discharge Exam: ?Filed Weights  ? 01/14/22 2236 01/15/22 0326  ?Weight: 60.3 kg 60.3 kg  ? ?Constitutional:  ?VSS ?General Appearance: alert, well-developed, well-nourished, NAD ?Eyes: ?Normal lids and conjunctive, non-icteric sclera ?Ears, Nose, Mouth, Throat: ?Normal appearance ?Neck: ?No masses, trachea midline ?Respiratory: ?Normal respiratory effort ?Breath sounds normal, no wheeze/rhonchi/rales ?Cardiovascular: ?S1/S2 normal, no murmur/rub/gallop auscultated ?No lower extremity edema ?Gastrointestinal: ?Nontender, no masse ?No hernia appreciated ?Musculoskeletal:  ?No clubbing/cyanosis of digits ?Neurological: ?No cranial nerve deficit on limited exam ?Psychiatric: ?Fair  judgment/insight ?Anxious mood and  affect ? ? ?Condition at discharge: good ? ?The results of significant diagnostics from this hospitalization (including imaging, microbiology, ancillary and laboratory) are listed below for reference.  ? ?[facility]

## 2022-01-20 DIAGNOSIS — F339 Major depressive disorder, recurrent, unspecified: Secondary | ICD-10-CM | POA: Diagnosis not present

## 2022-01-20 DIAGNOSIS — R296 Repeated falls: Secondary | ICD-10-CM | POA: Diagnosis not present

## 2022-01-20 DIAGNOSIS — K219 Gastro-esophageal reflux disease without esophagitis: Secondary | ICD-10-CM | POA: Diagnosis not present

## 2022-01-20 DIAGNOSIS — D62 Acute posthemorrhagic anemia: Secondary | ICD-10-CM | POA: Diagnosis not present

## 2022-01-20 DIAGNOSIS — K5904 Chronic idiopathic constipation: Secondary | ICD-10-CM | POA: Diagnosis not present

## 2022-01-20 DIAGNOSIS — N179 Acute kidney failure, unspecified: Secondary | ICD-10-CM | POA: Diagnosis not present

## 2022-01-20 DIAGNOSIS — I872 Venous insufficiency (chronic) (peripheral): Secondary | ICD-10-CM | POA: Diagnosis not present

## 2022-01-20 DIAGNOSIS — I5033 Acute on chronic diastolic (congestive) heart failure: Secondary | ICD-10-CM | POA: Diagnosis not present

## 2022-01-20 DIAGNOSIS — S72142D Displaced intertrochanteric fracture of left femur, subsequent encounter for closed fracture with routine healing: Secondary | ICD-10-CM | POA: Diagnosis not present

## 2022-01-23 DIAGNOSIS — R296 Repeated falls: Secondary | ICD-10-CM | POA: Diagnosis not present

## 2022-01-23 DIAGNOSIS — N179 Acute kidney failure, unspecified: Secondary | ICD-10-CM | POA: Diagnosis not present

## 2022-01-23 DIAGNOSIS — F339 Major depressive disorder, recurrent, unspecified: Secondary | ICD-10-CM | POA: Diagnosis not present

## 2022-01-23 DIAGNOSIS — I5033 Acute on chronic diastolic (congestive) heart failure: Secondary | ICD-10-CM | POA: Diagnosis not present

## 2022-01-23 DIAGNOSIS — D62 Acute posthemorrhagic anemia: Secondary | ICD-10-CM | POA: Diagnosis not present

## 2022-01-23 DIAGNOSIS — K5904 Chronic idiopathic constipation: Secondary | ICD-10-CM | POA: Diagnosis not present

## 2022-01-23 DIAGNOSIS — K219 Gastro-esophageal reflux disease without esophagitis: Secondary | ICD-10-CM | POA: Diagnosis not present

## 2022-01-23 DIAGNOSIS — I872 Venous insufficiency (chronic) (peripheral): Secondary | ICD-10-CM | POA: Diagnosis not present

## 2022-01-23 DIAGNOSIS — S72142D Displaced intertrochanteric fracture of left femur, subsequent encounter for closed fracture with routine healing: Secondary | ICD-10-CM | POA: Diagnosis not present

## 2022-01-25 DIAGNOSIS — K5904 Chronic idiopathic constipation: Secondary | ICD-10-CM | POA: Diagnosis not present

## 2022-01-25 DIAGNOSIS — K219 Gastro-esophageal reflux disease without esophagitis: Secondary | ICD-10-CM | POA: Diagnosis not present

## 2022-01-25 DIAGNOSIS — D62 Acute posthemorrhagic anemia: Secondary | ICD-10-CM | POA: Diagnosis not present

## 2022-01-25 DIAGNOSIS — S72142D Displaced intertrochanteric fracture of left femur, subsequent encounter for closed fracture with routine healing: Secondary | ICD-10-CM | POA: Diagnosis not present

## 2022-01-25 DIAGNOSIS — I5033 Acute on chronic diastolic (congestive) heart failure: Secondary | ICD-10-CM | POA: Diagnosis not present

## 2022-01-25 DIAGNOSIS — N179 Acute kidney failure, unspecified: Secondary | ICD-10-CM | POA: Diagnosis not present

## 2022-01-25 DIAGNOSIS — R296 Repeated falls: Secondary | ICD-10-CM | POA: Diagnosis not present

## 2022-01-25 DIAGNOSIS — F339 Major depressive disorder, recurrent, unspecified: Secondary | ICD-10-CM | POA: Diagnosis not present

## 2022-01-25 DIAGNOSIS — I872 Venous insufficiency (chronic) (peripheral): Secondary | ICD-10-CM | POA: Diagnosis not present

## 2022-01-27 DIAGNOSIS — R296 Repeated falls: Secondary | ICD-10-CM | POA: Diagnosis not present

## 2022-01-27 DIAGNOSIS — D62 Acute posthemorrhagic anemia: Secondary | ICD-10-CM | POA: Diagnosis not present

## 2022-01-27 DIAGNOSIS — S72142D Displaced intertrochanteric fracture of left femur, subsequent encounter for closed fracture with routine healing: Secondary | ICD-10-CM | POA: Diagnosis not present

## 2022-01-27 DIAGNOSIS — N179 Acute kidney failure, unspecified: Secondary | ICD-10-CM | POA: Diagnosis not present

## 2022-01-27 DIAGNOSIS — F339 Major depressive disorder, recurrent, unspecified: Secondary | ICD-10-CM | POA: Diagnosis not present

## 2022-01-27 DIAGNOSIS — S72142A Displaced intertrochanteric fracture of left femur, initial encounter for closed fracture: Secondary | ICD-10-CM | POA: Diagnosis not present

## 2022-01-27 DIAGNOSIS — I5033 Acute on chronic diastolic (congestive) heart failure: Secondary | ICD-10-CM | POA: Diagnosis not present

## 2022-01-27 DIAGNOSIS — K5904 Chronic idiopathic constipation: Secondary | ICD-10-CM | POA: Diagnosis not present

## 2022-01-27 DIAGNOSIS — I872 Venous insufficiency (chronic) (peripheral): Secondary | ICD-10-CM | POA: Diagnosis not present

## 2022-01-27 DIAGNOSIS — K219 Gastro-esophageal reflux disease without esophagitis: Secondary | ICD-10-CM | POA: Diagnosis not present

## 2022-01-30 DIAGNOSIS — M6281 Muscle weakness (generalized): Secondary | ICD-10-CM | POA: Diagnosis not present

## 2022-01-30 DIAGNOSIS — N179 Acute kidney failure, unspecified: Secondary | ICD-10-CM | POA: Diagnosis not present

## 2022-01-30 DIAGNOSIS — K219 Gastro-esophageal reflux disease without esophagitis: Secondary | ICD-10-CM | POA: Diagnosis not present

## 2022-01-30 DIAGNOSIS — I5033 Acute on chronic diastolic (congestive) heart failure: Secondary | ICD-10-CM | POA: Diagnosis not present

## 2022-01-30 DIAGNOSIS — S72142D Displaced intertrochanteric fracture of left femur, subsequent encounter for closed fracture with routine healing: Secondary | ICD-10-CM | POA: Diagnosis not present

## 2022-02-03 DIAGNOSIS — S72142D Displaced intertrochanteric fracture of left femur, subsequent encounter for closed fracture with routine healing: Secondary | ICD-10-CM | POA: Diagnosis not present

## 2022-02-03 DIAGNOSIS — I872 Venous insufficiency (chronic) (peripheral): Secondary | ICD-10-CM | POA: Diagnosis not present

## 2022-02-03 DIAGNOSIS — N179 Acute kidney failure, unspecified: Secondary | ICD-10-CM | POA: Diagnosis not present

## 2022-02-03 DIAGNOSIS — I5033 Acute on chronic diastolic (congestive) heart failure: Secondary | ICD-10-CM | POA: Diagnosis not present

## 2022-02-03 DIAGNOSIS — K219 Gastro-esophageal reflux disease without esophagitis: Secondary | ICD-10-CM | POA: Diagnosis not present

## 2022-02-03 DIAGNOSIS — K5904 Chronic idiopathic constipation: Secondary | ICD-10-CM | POA: Diagnosis not present

## 2022-02-03 DIAGNOSIS — F339 Major depressive disorder, recurrent, unspecified: Secondary | ICD-10-CM | POA: Diagnosis not present

## 2022-02-03 DIAGNOSIS — D62 Acute posthemorrhagic anemia: Secondary | ICD-10-CM | POA: Diagnosis not present

## 2022-02-13 ENCOUNTER — Other Ambulatory Visit: Payer: Self-pay | Admitting: Internal Medicine

## 2022-02-17 DIAGNOSIS — S72142A Displaced intertrochanteric fracture of left femur, initial encounter for closed fracture: Secondary | ICD-10-CM | POA: Diagnosis not present

## 2022-02-24 DIAGNOSIS — S72142A Displaced intertrochanteric fracture of left femur, initial encounter for closed fracture: Secondary | ICD-10-CM | POA: Diagnosis not present

## 2022-03-03 ENCOUNTER — Other Ambulatory Visit: Payer: Self-pay | Admitting: Internal Medicine

## 2022-03-03 NOTE — Telephone Encounter (Signed)
Last office visit 12/06/21 for CPE.  Last refilled 02/13/22 for #90 with no refills.  Next Appt: 12/12/22 for CPE.

## 2022-04-07 DIAGNOSIS — S72142A Displaced intertrochanteric fracture of left femur, initial encounter for closed fracture: Secondary | ICD-10-CM | POA: Diagnosis not present

## 2022-05-16 ENCOUNTER — Other Ambulatory Visit: Payer: Self-pay | Admitting: Internal Medicine

## 2022-05-19 DIAGNOSIS — S72142A Displaced intertrochanteric fracture of left femur, initial encounter for closed fracture: Secondary | ICD-10-CM | POA: Diagnosis not present

## 2022-05-23 DIAGNOSIS — H401132 Primary open-angle glaucoma, bilateral, moderate stage: Secondary | ICD-10-CM | POA: Diagnosis not present

## 2022-05-23 DIAGNOSIS — Z0100 Encounter for examination of eyes and vision without abnormal findings: Secondary | ICD-10-CM | POA: Diagnosis not present

## 2022-05-28 ENCOUNTER — Encounter: Payer: Self-pay | Admitting: Internal Medicine

## 2022-06-14 ENCOUNTER — Telehealth: Payer: Self-pay | Admitting: *Deleted

## 2022-06-14 NOTE — Patient Outreach (Signed)
  Care Coordination TOC Note Transition Care Management Follow-up Telephone Call Date of discharge and from where: Patient on [facility] list that she had been discharged from hospital. Per patient she has not been in the hospital recently.    Encounter Outcome:  Pt. Visit Completed

## 2022-06-30 ENCOUNTER — Ambulatory Visit: Payer: Medicare PPO | Admitting: Cardiovascular Disease

## 2022-07-28 ENCOUNTER — Encounter: Payer: Self-pay | Admitting: Internal Medicine

## 2022-07-28 ENCOUNTER — Telehealth: Payer: Self-pay | Admitting: Internal Medicine

## 2022-07-28 NOTE — Telephone Encounter (Signed)
Patient called in and stated that if this can be sent in today they can get it delivered to her today. They don't deliver on Saturday.

## 2022-07-28 NOTE — Telephone Encounter (Signed)
This isn't on her med list as she has not been had it filled since 2021. Will have to get Dr Silvio Pate to approve.

## 2022-07-28 NOTE — Telephone Encounter (Signed)
Pt called asking for the status of the refill of her meds? Call back # [phone]

## 2022-07-28 NOTE — Telephone Encounter (Signed)
Rx sent at 1pm. Advised pt.

## 2022-07-28 NOTE — Telephone Encounter (Signed)
error 

## 2022-08-01 ENCOUNTER — Ambulatory Visit: Payer: Medicare PPO | Admitting: Cardiovascular Disease

## 2022-09-12 ENCOUNTER — Ambulatory Visit: Payer: Medicare PPO | Admitting: Cardiovascular Disease

## 2022-10-10 DIAGNOSIS — S72142D Displaced intertrochanteric fracture of left femur, subsequent encounter for closed fracture with routine healing: Secondary | ICD-10-CM | POA: Diagnosis not present

## 2022-10-24 ENCOUNTER — Ambulatory Visit: Payer: Medicare PPO | Admitting: Cardiovascular Disease

## 2022-11-10 DIAGNOSIS — S72142D Displaced intertrochanteric fracture of left femur, subsequent encounter for closed fracture with routine healing: Secondary | ICD-10-CM | POA: Diagnosis not present

## 2022-12-09 DIAGNOSIS — S72142D Displaced intertrochanteric fracture of left femur, subsequent encounter for closed fracture with routine healing: Secondary | ICD-10-CM | POA: Diagnosis not present

## 2022-12-12 ENCOUNTER — Other Ambulatory Visit: Payer: Self-pay | Admitting: Internal Medicine

## 2022-12-12 ENCOUNTER — Ambulatory Visit (INDEPENDENT_AMBULATORY_CARE_PROVIDER_SITE_OTHER): Payer: Medicare PPO | Admitting: Internal Medicine

## 2022-12-12 ENCOUNTER — Encounter: Payer: Self-pay | Admitting: Internal Medicine

## 2022-12-12 VITALS — BP 112/70 | HR 88 | Temp 96.4°F | Ht 64.0 in | Wt 125.0 lb

## 2022-12-12 DIAGNOSIS — E441 Mild protein-calorie malnutrition: Secondary | ICD-10-CM | POA: Diagnosis not present

## 2022-12-12 DIAGNOSIS — R5381 Other malaise: Secondary | ICD-10-CM

## 2022-12-12 DIAGNOSIS — G629 Polyneuropathy, unspecified: Secondary | ICD-10-CM | POA: Diagnosis not present

## 2022-12-12 DIAGNOSIS — Z0000 Encounter for general adult medical examination without abnormal findings: Secondary | ICD-10-CM

## 2022-12-12 DIAGNOSIS — F39 Unspecified mood [affective] disorder: Secondary | ICD-10-CM | POA: Diagnosis not present

## 2022-12-12 DIAGNOSIS — I5032 Chronic diastolic (congestive) heart failure: Secondary | ICD-10-CM

## 2022-12-12 LAB — CBC
HCT: 44.5 % (ref 36.0–46.0)
Hemoglobin: 14.7 g/dL (ref 12.0–15.0)
MCHC: 33 g/dL (ref 30.0–36.0)
MCV: 93.2 fl (ref 78.0–100.0)
Platelets: 205 10*3/uL (ref 150.0–400.0)
RBC: 4.78 Mil/uL (ref 3.87–5.11)
RDW: 13.7 % (ref 11.5–15.5)
WBC: 5.6 10*3/uL (ref 4.0–10.5)

## 2022-12-12 LAB — COMPREHENSIVE METABOLIC PANEL
ALT: 10 U/L (ref 0–35)
AST: 16 U/L (ref 0–37)
Albumin: 4 g/dL (ref 3.5–5.2)
Alkaline Phosphatase: 64 U/L (ref 39–117)
BUN: 19 mg/dL (ref 6–23)
CO2: 26 mEq/L (ref 19–32)
Calcium: 9.8 mg/dL (ref 8.4–10.5)
Chloride: 102 mEq/L (ref 96–112)
Creatinine, Ser: 0.31 mg/dL — ABNORMAL LOW (ref 0.40–1.20)
GFR: 101.01 mL/min (ref >60.00)
Glucose, Bld: 86 mg/dL (ref 70–99)
Potassium: 4.2 mEq/L (ref 3.5–5.1)
Sodium: 140 mEq/L (ref 135–145)
Total Bilirubin: 0.5 mg/dL (ref 0.2–1.2)
Total Protein: 7.2 g/dL (ref 6.0–8.3)

## 2022-12-12 LAB — VITAMIN D 25 HYDROXY (VIT D DEFICIENCY, FRACTURES): VITD: 45.95 ng/mL (ref 30.00–100.00)

## 2022-12-12 LAB — VITAMIN B12: Vitamin B-12: 338 pg/mL (ref 211–911)

## 2022-12-12 MED ORDER — MECLIZINE HCL 25 MG PO TABS: ORAL_TABLET | ORAL | 1 refills | Status: DC

## 2022-12-12 MED ORDER — MOMETASONE FUROATE 0.1 % EX CREA: 1.0000 | TOPICAL_CREAM | Freq: Two times a day (BID) | CUTANEOUS | 1 refills | Status: DC | PRN

## 2022-12-12 MED ORDER — VENLAFAXINE HCL 75 MG PO TABS: 75.0000 mg | ORAL_TABLET | Freq: Two times a day (BID) | ORAL | 3 refills | Status: DC

## 2022-12-12 NOTE — Assessment & Plan Note (Signed)
Chronic weakness that defies diagnosis Now worsened status since hip fracture

## 2022-12-12 NOTE — Assessment & Plan Note (Signed)
Chronic issues with depression and anxiety/panic Tried off depakote and venlafaxine and didn't do well Will continue

## 2022-12-12 NOTE — Assessment & Plan Note (Signed)
I have personally reviewed the Medicare Annual Wellness questionnaire and have noted 1. The patient's medical and social history 2. Their use of alcohol, tobacco or illicit drugs 3. Their current medications and supplements 4. The patient's functional ability including ADL's, fall risks, home safety risks and hearing or visual             impairment. 5. Diet and physical activities 6. Evidence for depression or mood disorders  The patients weight, height, BMI and visual acuity have been recorded in the chart I have made referrals, counseling and provided education to the patient based review of the above and I have provided the pt with a written personalized care plan for preventive services.  I have provided you with a copy of your personalized plan for preventive services. Please take the time to review along with your updated medication list.  Done with cancer screening Unable to exercise Will be due for flu vaccine again in the fall---and eventually updated COVID

## 2022-12-12 NOTE — Assessment & Plan Note (Signed)
Had been down to 110# when in rehab Is now gaining back some weight with better appetite

## 2022-12-12 NOTE — Progress Notes (Signed)
Hearing Screening - Comments:: Aware of hearing loss.  Vision Screening - Comments:: October 2023  

## 2022-12-12 NOTE — Progress Notes (Signed)
Subjective:    Patient ID: Alexandria Sherman, female    DOB: [birth year], 77 y.o.   MRN: [id number]  HPI Here for Medicare wellness visit and follow up of chronic health conditions Reviewed advanced directives Reviewed other doctors---Dr [facility], Dr Arida--cardiology, Dr Donnamarie Poag dentistry, Dr Dingledein--ophthal Had left hip fracture repaired--only surgery and hospitalization Vision is okay----Rx for glaucoma. Not ready to proceed with cataract surgery Hearing is poor--not ready to take action No alcohol or tobacco Not able to exercise Did fall with the hip fracture. No falls since then Ongoing mood issues Some word finding issues---but no other memory issues  Broke left hip in April---repaired by Dr Sabra Heck (pins, etc) Did rehab at [facility] left after 2 weeks (didn't like it) Then had home PT--not much OT "It did not go well" Walks very short distances with walker and assistance Even needs assistance transfering Has 3 two hour shifts of aides daily-stays on sofa when no one is there 8-10AM, 2-4PM, 7:15- 8:15PM.  Only uses bathroom when aides are there Friend does the grocery shoppng. Another friend cleans house, does laundry and brings her to appointments Has long term care insurance to help with this Stays in house coat. Assistance with showers every other day. Cleans herself after moving bowels Ongoing urinary incontinence--sleeps with pads and pull ups Considered pure wick--but can't keep it up  Still on the meds for depression Went back on the venlafaxine and depakote (but takes one with bread to prevent the nausea) Feels discouraged--not new Meds prevent the panic attacks---still has the clorazepate for prn   Has lost more weight Overall has lost 10-20% of body weight Has gained weight back from when she was in rehab---and appetite is better  No chest pain Some edema--seems to be venous insufficiency No SOB---not able to exercise Does sleep in  bed--flat. No PND No palpitations No dizziness or syncope  Rare heartburn Continues on the omeprazole Occasional choking--if she isn't careful swallowing  Current Outpatient Medications on File Prior to Visit  Medication Sig Dispense Refill   aspirin EC 81 MG EC tablet Take 1 tablet (81 mg total) by mouth 2 (two) times daily. Swallow whole. 30 tablet 11   bisacodyl (DULCOLAX) 10 MG suppository Place 1 suppository (10 mg total) rectally daily as needed for moderate constipation. 12 suppository 0   calcium carbonate (OSCAL) 1500 (600 Ca) MG TABS tablet Take 600 mg of elemental calcium 2 (two) times daily by mouth.     cholecalciferol (VITAMIN D) 1000 UNITS tablet Take 2,000 Units by mouth daily.     clorazepate (TRANXENE) 3.75 MG tablet TAKE 1 TABLET BY MOUTH 3 TIMES DAILY AS NEEDED FOR ANXIETY. 90 tablet 0   divalproex (DEPAKOTE) 125 MG DR tablet TAKE 1 TABLET BY MOUTH TWICE DAILY 180 tablet 3   ibuprofen (ADVIL,MOTRIN) 200 MG tablet Take 200 mg by mouth every 6 (six) hours as needed.     latanoprost (XALATAN) 0.005 % ophthalmic solution Place 1 drop at bedtime into both eyes.      meclizine (ANTIVERT) 25 MG tablet TAKE ONE TABLET DAILY AS NEEDED 90 tablet 1   mometasone (ELOCON) 0.1 % cream Apply 1 application topically 2 (two) times daily as needed (itching). 45 g 1   omeprazole (PRILOSEC) 20 MG capsule TAKE 1 CAPSULE BY MOUTH ONCE DAILY 90 capsule 3   polyethylene glycol (MIRALAX / GLYCOLAX) 17 g packet Take 17 g by mouth daily as needed for mild constipation. 14 each 0   timolol (  TIMOPTIC) 0.5 % ophthalmic solution Place 1 drop daily into both eyes.     venlafaxine (EFFEXOR) 75 MG tablet TAKE 1 TABLET BY MOUTH TWICE DAILY 180 tablet 3   [DISCONTINUED] Calcium Carbonate (CALCIUM 500 PO) Take one by mouth twice a day      No current facility-administered medications on file prior to visit.    Allergies  Allergen Reactions   Augmentin [Amoxicillin-Pot Clavulanate] Other (See Comments)     Pt cannot remember   Cephalexin     REACTION: vaginal blisters Tolerates penicillins   Codeine Phosphate     REACTION: vomiting   Promethazine Hcl     REACTION: nerves    Past Medical History:  Diagnosis Date   Anxiety disorder, unspecified    Depression 1978   Diverticulosis of colon 04/2005   GERD (gastroesophageal reflux disease)    Hypertension    Hyperthyroidism    Dr Gabriel Carina   Impaired fasting glucose    Low TSH level    chronic low, but euthyroid   Microscopic hematuria    RBBB    Sleep disturbance    Subclinical hyperthyroidism    unspecified   Tibia fracture 05/2010   left    Past Surgical History:  Procedure Laterality Date   ABDOMINAL HYSTERECTOMY  1988   hysterectomy/ BSO for endometriosis   BREAST BIOPSY Right 1992   neg   BREAST EXCISIONAL BIOPSY Right 1988   benign per pt   INTRAMEDULLARY (IM) NAIL INTERTROCHANTERIC Left 01/15/2022   Procedure: INTRAMEDULLARY (IM) NAIL INTERTROCHANTRIC;  Surgeon: Earnestine Leys, MD;  Location: [facility] ORS;  Service: Orthopedics;  Laterality: Left;   KNEE ARTHROSCOPY  6/12   left--Dr Alvan Dame    Family History  Problem Relation Age of Onset   Cancer Mother        lung   Diabetes Mother    Depression Mother    Cancer Sister        oral cancer   Depression Sister    Depression Sister    Stroke Other    Heart attack Other    Cancer Other    Dementia Other    Breast cancer Neg Hx    Thyroid disease Neg Hx     Social History   Socioeconomic History   Marital status: Divorced    Spouse name: Not on file   Number of children: 0   Years of education: 16   Highest education level: Bachelor's degree (e.g., BA, AB, BS)  Occupational History   Occupation: Teacher--retired  Tobacco Use   Smoking status: Never    Passive exposure: Past   Smokeless tobacco: Never  Vaping Use   Vaping Use: Never used  Substance and Sexual Activity   Alcohol use: Yes    Comment: Occasional   Drug use: Not on file   Sexual  activity: Not Currently  Other Topics Concern   Not on file  Social History Narrative   Has living will   Both sisters have health care POA--- forms in chart   Now has DNR   No tube feeds if cognitively unaware   Social Determinants of Health   Financial Resource Strain: Not on file  Food Insecurity: Not on file  Transportation Needs: Not on file  Physical Activity: Not on file  Stress: Not on file  Social Connections: Not on file  Intimate Partner Violence: Not on file   Review of Systems Sleeps okay Wears seat belt Teeth okay--keeps up with dentist No suspicious skin lesions--hasn't  seen derm Bowels move okay most of the time. Uses miralax prn No sig back or joint pains. Some current right leg pain--may have pulled something Has some skin lesions now---but does plan to see dermatologist (nothing worrisome that I can see)    Objective:   Physical Exam Constitutional:      Appearance: Normal appearance.  HENT:     Mouth/Throat:     Pharynx: No oropharyngeal exudate or posterior oropharyngeal erythema.  Eyes:     Conjunctiva/sclera: Conjunctivae normal.     Pupils: Pupils are equal, round, and reactive to light.  Cardiovascular:     Rate and Rhythm: Normal rate and regular rhythm.     Heart sounds: No murmur heard.    No gallop.     Comments: Faint pedal pulses Pulmonary:     Effort: Pulmonary effort is normal.     Breath sounds: Normal breath sounds. No wheezing or rales.  Abdominal:     Palpations: Abdomen is soft.     Tenderness: There is no abdominal tenderness.  Musculoskeletal:     Cervical back: Neck supple.     Comments: Trace non pitting edema in calves  Lymphadenopathy:     Cervical: No cervical adenopathy.  Skin:    Findings: No rash.  Neurological:     Mental Status: She is alert and oriented to person, place, and time.     Comments: Word naming 13/30 seconds Recall 3/3  Some contractures in right hand  Psychiatric:        Mood and Affect:  Mood normal.        Behavior: Behavior normal.            Assessment & Plan:

## 2022-12-12 NOTE — Assessment & Plan Note (Signed)
Even more dependent now Can't transfer without assistance and needs help with ADLs now Will refer to Care Management to assess needs and options

## 2022-12-12 NOTE — Assessment & Plan Note (Signed)
Hasn't been to cardiologist lately No fluid overload--so will hold off on diuretics (especially with her troublesome incontinence)

## 2022-12-13 ENCOUNTER — Telehealth: Payer: Self-pay | Admitting: *Deleted

## 2022-12-13 LAB — VALPROIC ACID LEVEL: Valproic Acid Lvl: 15.5 mg/L — ABNORMAL LOW (ref 50.0–100.0)

## 2022-12-13 NOTE — Progress Notes (Signed)
  Care Coordination   Note   12/13/2022 Name: Alexandria Sherman MRN: [id number] DOB: 1946-03-14  Alexandria Sherman is a 77 y.o. year old female who sees Venia Carbon, MD for primary care. I reached out to Zachery Dauer by phone today to offer care coordination services.  [name] was given information about Care Coordination services today including:   The Care Coordination services include support from the care team which includes your Nurse Coordinator, Clinical Social Worker, or Pharmacist.  The Care Coordination team is here to help remove barriers to the health concerns and goals most important to you. Care Coordination services are voluntary, and the patient may decline or stop services at any time by request to their care team member.   Care Coordination Consent Status: Patient agreed to services and verbal consent obtained.   Follow up plan:  Telephone appointment with care coordination team member scheduled for:  12/20/2022 and 12/21/2022  Encounter Outcome:  Pt. Scheduled from referral   Julian Hy, [place] Direct Dial: [phone]

## 2022-12-20 ENCOUNTER — Ambulatory Visit: Payer: Self-pay

## 2022-12-20 NOTE — Patient Instructions (Addendum)
Visit Information  Thank you for taking time to visit with me today. Please don't hesitate to contact me if I can be of assistance to you.   Following are the goals we discussed today:  Call and schedule follow up with Dr. Fletcher Anon Monitor weights daily and record Review heart failure/ fall education information sent to you in the mail    Our next appointment is by telephone on 01/19/23 at 10:30 am  Please call the care guide team at [phone] if you need to cancel or reschedule your appointment.   If you are experiencing a Mental Health or [place] or need someone to talk to, please call the Suicide and Crisis Lifeline: 988 call 1-800-273-TALK (toll free, 24 hour hotline)  The patient verbalized understanding of instructions, educational materials, and care plan provided today and agreed to receive a mailed copy of patient instructions, educational materials, and care plan.   Quinn Plowman RN,BSN,CCM [place] [phone] direct line  Heart Failure Action Plan A heart failure action plan helps you understand what to do when you have symptoms of heart failure. Your action plan is a color-coded plan that lists the symptoms to watch for and indicates what actions to take. If you have symptoms in the red zone, you need medical care right away. If you have symptoms in the yellow zone, you are having problems. If you have symptoms in the Johana Hopkinson zone, you are doing well. Follow the plan that was created by you and your health care provider. Review your plan each time you visit your health care provider. Red zone These signs and symptoms mean you should get medical help right away: You have trouble breathing when resting. You have a dry cough that is getting worse. You have swelling or pain in your legs or abdomen that is getting worse. You suddenly gain more than 2-3 lb (0.9-1.4 kg) in 24 hours, or more than 5 lb (2.3 kg) in a week. This amount may be more or  less depending on your condition. You have trouble staying awake or you feel confused. You have chest pain. You do not have an appetite. You pass out. You have worsening sadness or depression. If you have any of these symptoms, call your local emergency services (911 in the [place] right away. Do not drive yourself to the hospital. Yellow zone These signs and symptoms mean your condition may be getting worse and you should make some changes: You have trouble breathing when you are active, or you need to sleep with your head raised on extra pillows to help you breathe. You have swelling in your legs or abdomen. You gain 2-3 lb (0.9-1.4 kg) in 24 hours, or 5 lb (2.3 kg) in a week. This amount may be more or less depending on your condition. You get tired easily. You have trouble sleeping. You have a dry cough. If you have any of these symptoms: Contact your health care provider within the next day. Your health care provider may adjust your medicines. Aira Sallade zone These signs mean you are doing well and can continue what you are doing: You do not have shortness of breath. You have very little swelling or no new swelling. Your weight is stable (no gain or loss). You have a normal activity level. You do not have chest pain or any other new symptoms. Follow these instructions at home: Take over-the-counter and prescription medicines only as told by your health care provider. Weigh yourself daily. Your target weight  is __________ lb (__________ kg). Call your health care provider if you gain more than __________ lb (__________ kg) in 24 hours, or more than __________ lb (__________ kg) in a week. Health care provider name: _____________________________________________________ Health care provider phone number: _____________________________________________________ Eat a heart-healthy diet. Work with a diet and nutrition specialist (dietitian) to create an eating plan that is best for you. Keep  all follow-up visits. This is important. Where to find more information American Heart Association: Summary A heart failure action plan helps you understand what to do when you have symptoms of heart failure. Follow the action plan that was created by you and your health care provider. Get help right away if you have any symptoms in the red zone. This information is not intended to replace advice given to you by your health care provider. Make sure you discuss any questions you have with your health care provider. Document Revised: 12/20/2021 Document Reviewed: 04/26/2020 Elsevier Patient Education  [place] Prevention in the Home, Adult Falls can cause injuries and can happen to people of all ages. There are many things you can do to make your home safer and to help prevent falls. What actions can I take to prevent falls? General information Use good lighting in all rooms. Make sure to: Replace any light bulbs that burn out. Turn on the lights in dark areas and use night-lights. Keep items that you use often in easy-to-reach places. Lower the shelves around your home if needed. Move furniture so that there are clear paths around it. Do not use throw rugs or other things on the floor that can make you trip. If any of your floors are uneven, fix them. Add color or contrast paint or tape to clearly mark and help you see: Grab bars or handrails. First and last steps of staircases. Where the edge of each step is. If you use a ladder or stepladder: Make sure that it is fully opened. Do not climb a closed ladder. Make sure the sides of the ladder are locked in place. Have someone hold the ladder while you use it. Know where your pets are as you move through your home. What can I do in the bathroom?     Keep the floor dry. Clean up any water on the floor right away. Remove soap buildup in the bathtub or shower. Buildup makes bathtubs and showers slippery. Use non-skid  mats or decals on the floor of the bathtub or shower. Attach bath mats securely with double-sided, non-slip rug tape. If you need to sit down in the shower, use a non-slip stool. Install grab bars by the toilet and in the bathtub and shower. Do not use towel bars as grab bars. What can I do in the bedroom? Make sure that you have a light by your bed that is easy to reach. Do not use any sheets or blankets on your bed that hang to the floor. Have a firm chair or bench with side arms that you can use for support when you get dressed. What can I do in the kitchen? Clean up any spills right away. If you need to reach something above you, use a step stool with a grab bar. Keep electrical cords out of the way. Do not use floor polish or wax that makes floors slippery. What can I do with my stairs? Do not leave anything on the stairs. Make sure that you have a light switch at the top and the bottom  of the stairs. Make sure that there are handrails on both sides of the stairs. Fix handrails that are broken or loose. Install non-slip stair treads on all your stairs if they do not have carpet. Avoid having throw rugs at the top or bottom of the stairs. Choose a carpet that does not hide the edge of the steps on the stairs. Make sure that the carpet is firmly attached to the stairs. Fix carpet that is loose or worn. What can I do on the outside of my home? Use bright outdoor lighting. Fix the edges of walkways and driveways and fix any cracks. Clear paths of anything that can make you trip, such as tools or rocks. Add color or contrast paint or tape to clearly mark and help you see anything that might make you trip as you walk through a door, such as a raised step or threshold. Trim any bushes or trees on paths to your home. Check to see if handrails are loose or broken and that both sides of all steps have handrails. Install guardrails along the edges of any raised decks and porches. Have leaves,  snow, or ice cleared regularly. Use sand, salt, or ice melter on paths if you live where there is ice and snow during the winter. Clean up any spills in your garage right away. This includes grease or oil spills. What other actions can I take? Review your medicines with your doctor. Some medicines can cause dizziness or changes in blood pressure, which increase your risk of falling. Wear shoes that: Have a low heel. Do not wear high heels. Have rubber bottoms and are closed at the toe. Feel good on your feet and fit well. Use tools that help you move around if needed. These include: Canes. Walkers. Scooters. Crutches. Ask your doctor what else you can do to help prevent falls. This may include seeing a physical therapist to learn to do exercises to move better and get stronger. Where to find more information Centers for Disease Control and Prevention, [place]: StoreMirror.com.cy [employer] on Aging: AquariamTheater.co.nz National Institute on Aging: AquariamTheater.co.nz Contact a doctor if: You are afraid of falling at home. You feel weak, drowsy, or dizzy at home. You fall at home. Get help right away if you: Lose consciousness or have trouble moving after a fall. Have a fall that causes a head injury. These symptoms may be an emergency. Get help right away. Call 911. Do not wait to see if the symptoms will go away. Do not drive yourself to the hospital. This information is not intended to replace advice given to you by your health care provider. Make sure you discuss any questions you have with your health care provider. Document Revised: 05/15/2022 Document Reviewed: 05/15/2022 Elsevier Patient Education  [place].

## 2022-12-20 NOTE — Patient Outreach (Signed)
  Care Coordination   Initial Visit Note   12/20/2022 Name: Alexandria Sherman MRN: [id number] DOB: Feb 11, 1946  Alexandria Sherman is a 77 y.o. year old female who sees Venia Carbon, MD for primary care. I spoke with  Zachery Dauer by phone today.  What matters to the patients health and wellness today?  Patient states she lives at home alone but has paid care aids 3 x per day 2 hours each. Patient states she has a friend that gets her groceries and help from a friend regarding transportation to her appointments. Patient reports sustaining a left hip fracture approximately 1 year ago due to a fall. She reports having a history of falls sustaining broken finger, toes, wrist and meniscus tear.  Patient denies any recent falls. Patient states she was not aware she had heart failure until recently. She states she was informed by her primary care provider. She states she does not have  a scheduled visit with her cardiologist at this time.  Patient states she would like to have a lift chair and a standing rollator.  Patient states she is concerned about long term housing and care for herself.  She reports having long term care insurance.     Goals Addressed             This Visit's Progress    Development of long term plan of  self management of heart failure/ falls       Interventions Today    Flowsheet Row Most Recent Value  Chronic Disease   Chronic disease during today's visit Congestive Heart Failure (CHF), Other  [falls]  General Interventions   General Interventions Discussed/Reviewed General Interventions Discussed, Doctor Visits, Durable Medical Equipment (DME)  [evaluation of current treatment plan related to HF/ falls  and patients adherence to plan as established by provider. Request sent to PCP for standing rollator. Assessed for recent falls,]  Doctor Visits Discussed/Reviewed Doctor Visits Discussed  [Reviewed scheduled/ upcoming provider visits]  Durable Medical Equipment (DME)  Other, Lift Chair  [facility] sent to PCP requesting standing walker order.  Advised to call insurance carrier regarding lift chair coverage.]  Exercise Interventions   Exercise Discussed/Reviewed Physical Activity  Physical Activity Discussed/Reviewed Physical Activity Discussed  [discussed importance of daily physical activity]  Education Interventions   Education Provided Provided Printed Education, Provided Education  Provided Verbal Education On Other  [Discussed heart failure signs/ symptoms and home management of heart failure.  Mailed patient education information regarding heart failure action plan / zones.]  Pharmacy Interventions   Pharmacy Dicussed/Reviewed Pharmacy Topics Reviewed  [medication compliance discussed and reviewed.]  Safety Interventions   Safety Discussed/Reviewed Fall Risk  [Education article on fall prevention sent to patient.]              SDOH assessments and interventions completed:  Yes  SDOH Interventions Today    Flowsheet Row Most Recent Value  SDOH Interventions   Food Insecurity Interventions Intervention Not Indicated  Housing Interventions Intervention Not Indicated  Transportation Interventions Intervention Not Indicated        Care Coordination Interventions:  Yes, provided   Follow up plan: Follow up call scheduled for 01/19/23    Encounter Outcome:  Pt. Visit Completed   Quinn Plowman RN,BSN,CCM [place] [phone] direct line

## 2022-12-21 ENCOUNTER — Encounter: Payer: Medicare PPO | Admitting: *Deleted

## 2022-12-21 ENCOUNTER — Encounter: Payer: Medicare PPO | Admitting: Licensed Clinical Social Worker

## 2022-12-22 ENCOUNTER — Ambulatory Visit: Payer: Self-pay | Admitting: *Deleted

## 2022-12-22 NOTE — Patient Instructions (Signed)
Visit Information  Thank you for taking time to visit with me today. Please don't hesitate to contact me if I can be of assistance to you.   Following are the goals we discussed today:   Goals Addressed             This Visit's Progress    "I would like to know what my options are after my long term care insurance runs out"       Care Coordination Interventions: Level of Care options explained Medicaid covered facilities vs. Private pay List of private pay facilities to be mailed to patient's home for review and consideration Patient considering a reverse mortgage, will schedule consultation and invite family/friend to attend meeting for support          Our next appointment is by telephone on 12/28/22 at 12pm  Please call the care guide team at [phone] if you need to cancel or reschedule your appointment.   If you are experiencing a Mental Health or [place] or need someone to talk to, please call the Suicide and Crisis Lifeline: 988   Patient verbalizes understanding of instructions and care plan provided today and agrees to view in [place]. Active MyChart status and patient understanding of how to access instructions and care plan via MyChart confirmed with patient.     Telephone follow up appointment with care management team member scheduled for: 12/28/22  Elliot Gurney, [place] Worker  [facility] Care Management [phone]

## 2022-12-22 NOTE — Patient Outreach (Signed)
  Care Coordination   Initial Visit Note   12/22/2022 Name: Alexandria Sherman MRN: [id number] DOB: 1946-02-09  Alexandria Sherman is a 77 y.o. year old female who sees Venia Carbon, MD for primary care. I spoke with  Alexandria Sherman by phone today.  What matters to the patients health and wellness today?  Developing a long term care plan once her long term care insurance ends.  Patient currently confirms having private duty aids 3x per day to assist with her daily care. Patient willing to consider facility care in an assisted living and will be provided with resources to consider.  Goals Addressed             This Visit's Progress    "I would like to know what my options are after my long term care insurance runs out"       Care Coordination Interventions: Level of Care options explained Medicaid covered facilities vs. Private pay List of private pay facilities to be mailed to patient's home for review and consideration Patient considering a reverse mortgage, will schedule consultation and invite family/friend to attend meeting for support          SDOH assessments and interventions completed:  Yes  SDOH Interventions Today    Flowsheet Row Most Recent Value  SDOH Interventions   Food Insecurity Interventions Intervention Not Indicated  Housing Interventions Intervention Not Indicated  Transportation Interventions Intervention Not Indicated  Depression Interventions/Treatment  --  [currently on medication for depression and anxiety-denies need for mental health follow up]        Care Coordination Interventions:  Yes, provided  Interventions Today    Flowsheet Row Most Recent Value  Chronic Disease   Chronic disease during today's visit Other  [multiple falls]  General Interventions   General Interventions Discussed/Reviewed General Interventions Discussed, Level of Care  Level of Care Assisted Living, [place]  [PT confirmed  having LTC insurance, has care aids in the home 3x per day, discussed out of home placement options, patient discussed considering reverse mortgage options and consulting with a elder law attorney for financial advice]  Education Interventions   Education Provided Provided Education  [clarified level of care options]  Mental Health Interventions   Mental Health Discussed/Reviewed --  [patient discussed history of depression and anxiety and has been prescribed medications-patient denied thoughts of self harm but did state that she would be better off not being here-patient denied having a plan just wishes her situation was different]  Safety Interventions   Safety Discussed/Reviewed Safety Discussed  [provided patient with crisis line number [phone] in the event of a mental health crisis]       Follow up plan: Follow up call scheduled for 12/28/22    Encounter Outcome:  Pt. Visit Completed

## 2022-12-24 ENCOUNTER — Encounter: Payer: Self-pay | Admitting: Internal Medicine

## 2022-12-28 ENCOUNTER — Ambulatory Visit: Payer: Self-pay | Admitting: *Deleted

## 2022-12-28 NOTE — Patient Instructions (Signed)
Visit Information  Thank you for taking time to visit with me today. Please don't hesitate to contact me if I can be of assistance to you.   Following are the goals we discussed today:   Goals Addressed             This Visit's Progress    "I would like to know what my options are after my long term care insurance runs out"       Care Coordination Interventions: Private pay level of care options provided, including approximate price list Patient considering a reverse mortgage, will schedule consultation and invite family/friend to attend meeting for support either by person or on phone          If you are experiencing a Mental Health or [place] or need someone to talk to, please call 911   Patient verbalizes understanding of instructions and care plan provided today and agrees to view in [place]. Active MyChart status and patient understanding of how to access instructions and care plan via MyChart confirmed with patient.     No further follow up required: patient to contact this Education officer, museum with any additional community resource needs.  Elliot Gurney, [place] Worker  [facility] Care Management [phone]

## 2022-12-28 NOTE — Patient Outreach (Signed)
  Care Coordination   Follow Up Visit Note   12/28/2022 Name: Alexandria Sherman MRN: [id number] DOB: [birth year]  Alexandria Sherman is a 77 y.o. year old female who sees Venia Carbon, MD for primary care. I spoke with  Zachery Dauer by phone today.  What matters to the patients health and wellness today?  Options for long term care-options discussed patient will contact this Education officer, museum with any additional community resource needs.   Goals Addressed             This Visit's Progress    "I would like to know what my options are after my long term care insurance runs out"       Care Coordination Interventions: Private pay level of care options provided, including approximate price list Patient considering a reverse mortgage, will schedule consultation and invite family/friend to attend meeting for support either by person or on phone          SDOH assessments and interventions completed:  No     Care Coordination Interventions:  Yes, provided  Interventions Today    Flowsheet Row Most Recent Value  Chronic Disease   Chronic disease during today's visit Other  [multiple falls]  General Interventions   General Interventions Discussed/Reviewed General Interventions Reviewed, Level of Care  Level of Care Assisted Living  [assisted living level of care options discussed including costs]       Follow up plan: No further intervention required.   Encounter Outcome:  Pt. Visit Completed

## 2023-01-09 DIAGNOSIS — S72142D Displaced intertrochanteric fracture of left femur, subsequent encounter for closed fracture with routine healing: Secondary | ICD-10-CM | POA: Diagnosis not present

## 2023-01-23 ENCOUNTER — Ambulatory Visit: Payer: Self-pay

## 2023-01-23 NOTE — Patient Outreach (Unsigned)
  Care Coordination   Follow Up Visit Note   01/25/2023 Name: Alexandria Sherman MRN: [id number] DOB: Jun 06, 1946  Alexandria Sherman is a 77 y.o. year old female who sees Karie Schwalbe, MD for primary care. I spoke with  [name] by phone today.  What matters to the patients health and wellness today?  Patient denies having any falls or increase in symptoms related to her heart failure.  Patient states she doesn't have a scale. She state she thought she had one when discussed last with RNCM. Patient states she is not able to stand long enough to weigh without holding on. She states she knows this can skew the correct weight. She states her weight at her March 2024 visit with primary provider was 125 lbs.  Patient states she's not been able to schedule a follow up visit yet with her cardiologist due to managing her personal care aids. Patient reports she has private duty aides that assist with her care and she's had a few out due to various reasons.  She states she is working on [employer].  Patient declines to go with companies that provide aides because she says its to expensive.    Goals Addressed             This Visit's Progress    Development of long term plan of  self management of heart failure/ falls       Interventions Today    Flowsheet Row Most Recent Value  Chronic Disease   Chronic disease during today's visit Congestive Heart Failure (CHF), Other  [falls]  General Interventions   General Interventions Discussed/Reviewed Doctor Visits, [employer] of current treatment plan for HF/ falls and patients adherence to plan as established by provider.  Assessed for heart failure symptoms and weight. Assessed caregiver status.  Offered to send patient list of personal care agencies.]  Doctor Visits Discussed/Reviewed Doctor Visits Reviewed  [reviewed scheduled/ upcoming provider visits. Assessed if cardiology follow up visit made.]  Education  Interventions   Education Provided Provided Education  [patient advised to call her health plan to inquire about her OTC benefit that could assist her with purchasing scales.]  Pharmacy Interventions   Pharmacy Dicussed/Reviewed Pharmacy Topics Reviewed  [medications reviewed and compliance discussed.]  Safety Interventions   Safety Discussed/Reviewed Fall Risk  [Assessed for recent falls.]              SDOH assessments and interventions completed:  No     Care Coordination Interventions:  Yes, provided   Follow up plan: Follow up call scheduled for 02/06/23    Encounter Outcome:  Pt. Visit Completed   [name] RN,BSN,CCM [facility] Care Coordination [phone] direct line

## 2023-02-02 ENCOUNTER — Telehealth: Payer: Self-pay

## 2023-02-02 NOTE — Telephone Encounter (Signed)
   Telephone encounter was:  Unsuccessful.  02/02/2023 Name: Alexandria Sherman MRN: [id number] DOB: [birth year]  Unsuccessful outbound call made today to assist with:  Returned message patient left on [facility] Concierge Line for [name], Charity fundraiser. Left message to let patient know her message has been forwarded to [facility] via Epic and Outlook.   Outreach Attempt:  1st Attempt  A HIPAA compliant voice message was left requesting a return call.  Instructed patient to call back at [phone].  Ansar Skoda [facility]  [facility] Population Health Community Resource Care Guide   [email or web address]  ?? [phone]   Website: [email or web address]  [email or web address]

## 2023-02-06 ENCOUNTER — Telehealth: Payer: Self-pay

## 2023-02-06 NOTE — Telephone Encounter (Addendum)
   Telephone encounter was:  Unsuccessful.  02/06/2023 Name: Alexandria Sherman MRN: [id number] DOB: 1946/02/14  Unsuccessful outbound call made today to assist with:   Returning message left on [facility] Concierge line  Outreach Attempt:  2nd Attempt  A HIPAA compliant voice message was left requesting a return call.  Instructed patient to call back at [phone].  Eithan Beagle [facility]  [facility] Population Health Community Resource Care Guide   [email or web address]  ?? [phone]   Website: [email or web address]  [email or web address]

## 2023-02-08 DIAGNOSIS — S72142D Displaced intertrochanteric fracture of left femur, subsequent encounter for closed fracture with routine healing: Secondary | ICD-10-CM | POA: Diagnosis not present

## 2023-02-14 ENCOUNTER — Ambulatory Visit: Payer: Self-pay

## 2023-02-14 NOTE — Patient Instructions (Signed)
Visit Information  Thank you for taking time to visit with me today. Please don't hesitate to contact me if I can be of assistance to you.   Following are the goals we discussed today:  Continue to wear leg sleeves and elevated legs when sitting to help decrease swelling Remember heart failure symptoms: increase in shortness of breath, leg / feet/ ankle swelling, weight gain of 3 lbs overnight or 5 lbs in a week.   Reports these symptoms to your provider if noted.  Keep follow up appointments with provider. Take medications as prescribed.    Our next appointment is by telephone on 04/03/23 at 2 pm  Please call the care guide team at [phone] if you need to cancel or reschedule your appointment.   If you are experiencing a Mental Health or Behavioral Health Crisis or need someone to talk to, please call the Suicide and Crisis Lifeline: 988 call 1-800-273-TALK (toll free, 24 hour hotline)  Patient verbalizes understanding of instructions and care plan provided today and agrees to view in MyChart. Active MyChart status and patient understanding of how to access instructions and care plan via MyChart confirmed with patient.     [name] RN,BSN,CCM [facility] Care Coordination [phone] direct line

## 2023-02-14 NOTE — Patient Outreach (Signed)
  Care Coordination   Follow Up Visit Note   02/14/2023 Name: Alexandria Sherman MRN: [id number] DOB: [birth year]  Alexandria Sherman is a 77 y.o. year old female who sees Karie Schwalbe, MD for primary care. I spoke with  Alexandria Sherman by phone today.  What matters to the patients health and wellness today?  Patient states she is doing well. She denies any heart failure symptoms.   Patient states she scheduled her follow up visit with the cardiologist for 05/15/23.  Patient states she now has her new care aide in place.  Patient denies any recent falls.    Goals Addressed             This Visit's Progress    Development of long term plan of  self management of heart failure/ falls       Interventions Today    Flowsheet Row Most Recent Value  Chronic Disease   Chronic disease during today's visit Congestive Heart Failure (CHF), Other  [falls]  General Interventions   General Interventions Discussed/Reviewed General Interventions Reviewed, Doctor Visits  [evaluation of current treatment plan for HF/ falls and patients adherence to plan as established by provider.  Assessed for heart failure symptoms. Assessed for lower leg swelling]  Doctor Visits Discussed/Reviewed Doctor Visits Reviewed  [confirmed patient scheduled cardiology follow up appointment]  Education Interventions   Education Provided Provided Education  [reviewed heart failure symptoms and fall safety precautions. Confirmed patient has care aides in place.  Advised to elevate legs when sitting. Continues compression sleeves, low salt diet]  Pharmacy Interventions   Pharmacy Dicussed/Reviewed Pharmacy Topics Reviewed  [medications reviewed and compliance discussed.]              SDOH assessments and interventions completed:  No     Care Coordination Interventions:  Yes, provided   Follow up plan: Follow up call scheduled for 04/03/23    Encounter Outcome:  Pt. Visit Completed   [name] RN,BSN,CCM [facility] Care  Coordination [phone] direct line

## 2023-02-15 ENCOUNTER — Other Ambulatory Visit: Payer: Self-pay | Admitting: Internal Medicine

## 2023-02-27 DIAGNOSIS — H401132 Primary open-angle glaucoma, bilateral, moderate stage: Secondary | ICD-10-CM | POA: Diagnosis not present

## 2023-02-27 DIAGNOSIS — H2513 Age-related nuclear cataract, bilateral: Secondary | ICD-10-CM | POA: Diagnosis not present

## 2023-04-03 ENCOUNTER — Ambulatory Visit: Payer: Self-pay

## 2023-04-03 NOTE — Patient Outreach (Signed)
  Care Coordination   04/03/2023 Name: Alexandria Sherman MRN: [id number] DOB: 1946-09-12   Care Coordination Outreach Attempts:  An unsuccessful telephone outreach was attempted for a scheduled appointment today. Attempted call to patients listed mobile and home number.  Unable to reach.   Follow Up Plan:  Additional outreach attempts will be made to offer the patient care coordination information and services.   Encounter Outcome:  No Answer   Care Coordination Interventions:  No, not indicated    [name] [facility] Care Coordination [phone] direct line

## 2023-04-06 ENCOUNTER — Telehealth: Payer: Self-pay

## 2023-04-06 NOTE — Patient Outreach (Signed)
  Care Coordination   Follow Up Visit Note   04/06/2023 Name: Alexandria Sherman MRN: [id number] DOB: 1946/01/02  Alexandria Sherman is a 77 y.o. year old female who sees Karie Schwalbe, MD for primary care. I spoke with  [name] by phone today.  What matters to the patients health and wellness today?  Patient states she is doing fair.  She reports having follow up with cardiologist on 05/15/23.  Patient states she continues to have swelling in her feet ankles and calves.  She states the leg elevation, compression hose and leg sleeves helps to some degree with swelling but not for long.   Patient states her heels split off and on.  She reports having a licensed caregiver that monitors and manages her heels as needed.   Goals Addressed             This Visit's Progress    Development of long term plan of  self management of heart failure/ falls       Interventions Today    Flowsheet Row Most Recent Value  Chronic Disease   Chronic disease during today's visit Congestive Heart Failure (CHF), Other  [falls, left heel split]  General Interventions   General Interventions Discussed/Reviewed General Interventions Reviewed, Doctor Visits  [evaluation of current treatment plan for HF, falls. left heel split and patients adherence to plan as established by provider. Assessed for heart failure symptoms.]  Doctor Visits Discussed/Reviewed Doctor Visits Reviewed  [reviewed scheduled/ upcoming provider visits. advised to notify provider of heart failure symptoms or signs/ symptoms of infection.]  Education Interventions   Education Provided Provided Education  [reviewed heart failure symptoms. Reviewed signs/ symptoms of infection. Advised to continue to elevate legs, wear compression hose and sleeves. Advised to keep heels from pressing on surface.  Use pill under calf when elevating.]  Pharmacy Interventions   Pharmacy Dicussed/Reviewed Pharmacy Topics Reviewed  [medications reviewed an  compliance discussed.]  Safety Interventions   Safety Discussed/Reviewed Fall Risk  [assessed for falls.]              SDOH assessments and interventions completed:  No     Care Coordination Interventions:  Yes, provided   Follow up plan: Follow up call scheduled for 05/16/23    Encounter Outcome:  Pt. Visit Completed   [name] RN,BSN,CCM [facility] Care Coordination [phone] direct line

## 2023-04-06 NOTE — Patient Instructions (Signed)
Visit Information  Thank you for taking time to visit with me today. Please don't hesitate to contact me if I can be of assistance to you.   Following are the goals we discussed today:   Goals Addressed             This Visit's Progress    Development of long term plan of  self management of heart failure/ falls       Interventions Today    Flowsheet Row Most Recent Value  Chronic Disease   Chronic disease during today's visit Congestive Heart Failure (CHF), Other  [falls, left heel split]  General Interventions   General Interventions Discussed/Reviewed General Interventions Reviewed, Doctor Visits  [evaluation of current treatment plan for HF, falls. left heel split and patients adherence to plan as established by provider. Assessed for heart failure symptoms.]  Doctor Visits Discussed/Reviewed Doctor Visits Reviewed  [reviewed scheduled/ upcoming provider visits. advised to notify provider of heart failure symptoms or signs/ symptoms of infection.]  Education Interventions   Education Provided Provided Education  [reviewed heart failure symptoms. Reviewed signs/ symptoms of infection. Advised to continue to elevate legs, wear compression hose and sleeves. Advised to keep heels from pressing on surface.  Use pill under calf when elevating.]  Pharmacy Interventions   Pharmacy Dicussed/Reviewed Pharmacy Topics Reviewed  [medications reviewed an compliance discussed.]  Safety Interventions   Safety Discussed/Reviewed Fall Risk  [assessed for falls.]              Our next appointment is by telephone on 05/16/23 at 2 pm  Please call the care guide team at [phone] if you need to cancel or reschedule your appointment.   If you are experiencing a Mental Health or Behavioral Health Crisis or need someone to talk to, please call the Suicide and Crisis Lifeline: 988 call 1-800-273-TALK (toll free, 24 hour hotline)  Patient verbalizes understanding of instructions and care plan  provided today and agrees to view in MyChart. Active MyChart status and patient understanding of how to access instructions and care plan via MyChart confirmed with patient.     [name] RN,BSN,CCM [facility] Care Coordination [phone] direct line

## 2023-05-15 ENCOUNTER — Ambulatory Visit: Payer: Medicare PPO | Admitting: Cardiovascular Disease

## 2023-05-16 ENCOUNTER — Ambulatory Visit: Payer: Self-pay

## 2023-05-16 NOTE — Patient Instructions (Signed)
Visit Information  Thank you for taking time to visit with me today. Please don't hesitate to contact me if I can be of assistance to you.   Following are the goals we discussed today:   Goals Addressed             This Visit's Progress    Development of long term plan of  self management of heart failure/ falls and chronic health conditions       Interventions Today    Flowsheet Row Most Recent Value  Chronic Disease   Chronic disease during today's visit Congestive Heart Failure (CHF), Other  [falls, LE edema, left heel split]  General Interventions   General Interventions Discussed/Reviewed General Interventions Reviewed, Doctor Visits  [evaluation of current treatment plan for HF, falls, LE edema, and split heels and patients adherence to plan as established by provider.  Assessed for ongoing LE edema]  Doctor Visits Discussed/Reviewed Doctor Visits Reviewed  [reviewed upcoming provider visits.  Advised to schedule follow up visit with primary care provider to address LE swelling, split heels and request for podiatry referral]  Education Interventions   Education Provided Provided Education  [reviewed HF symptoms.  Assessed for HF symptoms.]  Pharmacy Interventions   Pharmacy Dicussed/Reviewed Pharmacy Topics Reviewed  [medications reviewed and compliance discussed.]  Safety Interventions   Safety Discussed/Reviewed Fall Risk  [assessed for falls. Fall prevention discussed.]              Our next appointment is by telephone on 06/25/23 at 2 pm  Please call the care guide team at [phone] if you need to cancel or reschedule your appointment.   If you are experiencing a Mental Health or Behavioral Health Crisis or need someone to talk to, please call the Suicide and Crisis Lifeline: 988 call 1-800-273-TALK (toll free, 24 hour hotline)  Patient verbalizes understanding of instructions and care plan provided today and agrees to view in MyChart. Active MyChart status and  patient understanding of how to access instructions and care plan via MyChart confirmed with patient.     [name] RN,BSN,CCM [facility] Care Coordination [phone] direct line

## 2023-05-16 NOTE — Patient Outreach (Signed)
  Care Coordination   Follow Up Visit Note   05/16/2023 Name: Alexandria Sherman MRN: [id number] DOB: 01-05-1946  Alexandria Sherman is a 77 y.o. year old female who sees Karie Schwalbe, MD for primary care. I spoke with  [name] by phone today.  What matters to the patients health and wellness today?  Patient  states she had an appointment scheduled with her cardiologist but due to an emergency the office had to cancel her appointment and reschedule to the end of October/ November 2024.  Patient states she has been frustrated about this because she has to wait so long again before having her cardiology follow up.   Patient states her feet/ ankles/ calf continue to swell daily. She reports having recent weeping from skin of legs but states this has subsided.   Patient reports sustaining recent fall.  She denies severe injury.  Patient reports caregiver was with her at time of fall.  She states her caregiver was unable to get her up from her fall so EMS was called.  Patient denies any heart failure symptoms.     Goals Addressed             This Visit's Progress    Development of long term plan of  self management of heart failure/ falls and chronic health conditions       Interventions Today    Flowsheet Row Most Recent Value  Chronic Disease   Chronic disease during today's visit Congestive Heart Failure (CHF), Other  [falls, LE edema, left heel split]  General Interventions   General Interventions Discussed/Reviewed General Interventions Reviewed, Doctor Visits  [evaluation of current treatment plan for HF, falls, LE edema, and split heels and patients adherence to plan as established by provider.  Assessed for ongoing LE edema]  Doctor Visits Discussed/Reviewed Doctor Visits Reviewed  [reviewed upcoming provider visits.  Advised to schedule follow up visit with primary care provider to address LE swelling, split heels and request for podiatry referral]  Education Interventions    Education Provided Provided Education  [reviewed HF symptoms.  Assessed for HF symptoms.]  Pharmacy Interventions   Pharmacy Dicussed/Reviewed Pharmacy Topics Reviewed  [medications reviewed and compliance discussed.]  Safety Interventions   Safety Discussed/Reviewed Fall Risk  [assessed for falls. Fall prevention discussed.]              SDOH assessments and interventions completed:  No     Care Coordination Interventions:  Yes, provided   Follow up plan: Follow up call scheduled for 06/25/23    Encounter Outcome:  Pt. Visit Completed   [name] RN,BSN,CCM [facility] Care Coordination [phone] direct line

## 2023-05-23 ENCOUNTER — Encounter: Payer: Self-pay | Admitting: Internal Medicine

## 2023-05-23 DIAGNOSIS — M79671 Pain in right foot: Secondary | ICD-10-CM

## 2023-05-24 ENCOUNTER — Encounter: Payer: Self-pay | Admitting: Cardiovascular Disease

## 2023-05-24 ENCOUNTER — Encounter: Payer: Self-pay | Admitting: *Deleted

## 2023-05-24 ENCOUNTER — Ambulatory Visit: Payer: Medicare PPO | Attending: Cardiovascular Disease | Admitting: Cardiovascular Disease

## 2023-05-24 VITALS — BP 128/80 | HR 97 | Ht 64.0 in | Wt 125.0 lb

## 2023-05-24 DIAGNOSIS — I872 Venous insufficiency (chronic) (peripheral): Secondary | ICD-10-CM

## 2023-05-24 DIAGNOSIS — I10 Essential (primary) hypertension: Secondary | ICD-10-CM

## 2023-05-24 DIAGNOSIS — M79606 Pain in leg, unspecified: Secondary | ICD-10-CM

## 2023-05-24 DIAGNOSIS — M79605 Pain in left leg: Secondary | ICD-10-CM | POA: Diagnosis not present

## 2023-05-24 DIAGNOSIS — M79604 Pain in right leg: Secondary | ICD-10-CM | POA: Diagnosis not present

## 2023-05-24 NOTE — Progress Notes (Signed)
Cardiology Office Note   Date:  05/24/2023   ID:  Alexandria Sherman, DOB 07-26-1946, MRN [id number]  PCP:  Karie Schwalbe, MD  Cardiologist:   Lorine Bears, MD   Chief Complaint  Patient presents with   12 month follow up     Patient c/o bilateral LE edema & shortness of breath with over exertion. Medications reviewed by the patient verbally.       History of Present Illness: Alexandria Sherman is a 77 y.o. female who is here today for a follow-up visit regarding chronic venous insufficiency. She has known history of anxiety and depression, GERD, hypertension and thyroid problems. She has history of recurrent falls and her mobility is limited.  She underwent vascular studies in February 2020.  Arterial Doppler showed normal ABI and toe pressure bilaterally with triphasic waveforms.  Venous Doppler was negative for DVT. She has chronic sinus tachycardia .  She is not a smoker and does not have history of diabetes. She had an echocardiogram done in May 2020 which showed normal LV systolic function with no significant valvular abnormalities.  She continues to use a lymphedema pump at least once a day and also uses knee-high support stockings during the day.  Her symptoms seem to be overall stable.  She has chronic discoloration of bilateral lower extremity.  She also has some numbness.   Past Medical History:  Diagnosis Date   Acute lateral meniscus tear of right knee    Anxiety disorder, unspecified    Depression 1978   Diverticulosis of colon 04/2005   GERD (gastroesophageal reflux disease)    Hypertension    Hyperthyroidism    Dr Tedd Sias   Impaired fasting glucose    Low TSH level    chronic low, but euthyroid   Microscopic hematuria    RBBB    S/p left hip fracture    Sleep disturbance    Subclinical hyperthyroidism    unspecified   Tibia fracture 05/2010   left    Past Surgical History:  Procedure Laterality Date   ABDOMINAL HYSTERECTOMY  1988    hysterectomy/ BSO for endometriosis   BREAST BIOPSY Right 1992   neg   BREAST EXCISIONAL BIOPSY Right 1988   benign per pt   INTRAMEDULLARY (IM) NAIL INTERTROCHANTERIC Left 01/15/2022   Procedure: INTRAMEDULLARY (IM) NAIL INTERTROCHANTRIC;  Surgeon: Deeann Saint, MD;  Location: [facility] ORS;  Service: Orthopedics;  Laterality: Left;   KNEE ARTHROSCOPY  6/12   left--Dr Charlann Boxer     Current Outpatient Medications  Medication Sig Dispense Refill   clorazepate (TRANXENE) 3.75 MG tablet TAKE 1 TABLET BY MOUTH 3 TIMES DAILY AS NEEDED FOR ANXIETY. 90 tablet 0   divalproex (DEPAKOTE) 125 MG DR tablet TAKE 1 TABLET BY MOUTH TWICE DAILY 180 tablet 3   ibuprofen (ADVIL,MOTRIN) 200 MG tablet Take 200 mg by mouth every 6 (six) hours as needed.     meclizine (ANTIVERT) 25 MG tablet TAKE ONE TABLET DAILY AS NEEDED 90 tablet 1   mometasone (ELOCON) 0.1 % cream Apply 1 Application topically 2 (two) times daily as needed (itching). 45 g 1   omeprazole (PRILOSEC) 20 MG capsule TAKE 1 CAPSULE BY MOUTH ONCE DAILY 90 capsule 3   polyethylene glycol (MIRALAX / GLYCOLAX) 17 g packet Take 17 g by mouth daily as needed for mild constipation. 14 each 0   venlafaxine (EFFEXOR) 75 MG tablet Take 1 tablet (75 mg total) by mouth 2 (two) times daily. 180 tablet 3  latanoprost (XALATAN) 0.005 % ophthalmic solution Place 1 drop at bedtime into both eyes.  (Patient not taking: Reported on 05/24/2023)     timolol (TIMOPTIC) 0.5 % ophthalmic solution Place 1 drop daily into both eyes. (Patient not taking: Reported on 05/24/2023)     No current facility-administered medications for this visit.    Allergies:   Augmentin [amoxicillin-pot clavulanate], Cephalexin, Codeine phosphate, and Promethazine hcl    Social History:  The patient  reports that she has never smoked. She has been exposed to tobacco smoke. She has never used smokeless tobacco. She reports current alcohol use.   Family History:  The patient's family history  includes Cancer in her mother, sister, and another family member; Dementia in an other family member; Depression in her mother, sister, and sister; Diabetes in her mother; Heart attack in an other family member; Stroke in an other family member.    ROS:  Please see the history of present illness.   Otherwise, review of systems are positive for none.   All other systems are reviewed and negative.    PHYSICAL EXAM: VS:  BP 128/80 (BP Location: Right Arm, Patient Position: Sitting, Cuff Size: Normal)   Pulse 97   Ht 5\' 4"  (1.626 m)   Wt 125 lb (56.7 kg)   SpO2 95%   BMI 21.46 kg/m  , BMI Body mass index is 21.46 kg/m. GEN: Well nourished, well developed, in no acute distress  HEENT: normal  Neck: no JVD, carotid bruits, or masses Cardiac: RRR with tachycardia; 1 / 6 systolic murmur in the aortic area, rubs, or gallops. Respiratory:  clear to auscultation bilaterally, normal work of breathing GI: soft, nontender, nondistended, + BS MS: no deformity or atrophy  Skin: warm and dry, no rash Neuro:  Strength and sensation are intact Psych: euthymic mood, full affect Vascular: Dorsalis pedis is palpable bilaterally but difficult to palpate due to edema.  There is mild bilateral leg edema with significant dark discoloration all the way up to the knees.   EKG:  EKG is ordered today. The ekg ordered today demonstrates: Normal sinus rhythm Right bundle branch block When compared with ECG of 14-Jan-2022 22:44, No significant change was found    Recent Labs: 12/12/2022: ALT 10; BUN 19; Creatinine, Ser 0.31; Hemoglobin 14.7; Platelets 205.0; Potassium 4.2; Sodium 140    Lipid Panel    Component Value Date/Time   CHOL 179 11/04/2018 1233   TRIG 84.0 11/04/2018 1233   HDL 77.70 11/04/2018 1233   CHOLHDL 2 11/04/2018 1233   VLDL 16.8 11/04/2018 1233   LDLCALC 85 11/04/2018 1233   LDLDIRECT 140.8 06/15/2009 1045      Wt Readings from Last 3 Encounters:  05/24/23 125 lb (56.7 kg)   12/12/22 125 lb (56.7 kg)  01/15/22 133 lb (60.3 kg)          11/12/2018    3:49 PM  PAD Screen  Previous PAD dx? Yes  Previous surgical procedure? No  Feet/toe relief with dangling? Yes  Painful, non-healing ulcers? No  Extremities discolored? Yes      ASSESSMENT AND PLAN:  1.  Severe chronic venous insufficiency with stasis dermatitis: With intermittent ulceration.  No ulceration at this time.  Continue using lymphedema pump during the day as well as knee-high support stockings.  I also recommended leg elevation during the day and suggested using the lounge doctor leg rest. Given increased discomfort in her feet, will repeat her ABI to ensure no arterial insufficiency.  2.  Dyspnea with sinus tachycardia: She is not tachycardic today.  Previous echocardiogram showed normal LV systolic function and no significant valvular abnormalities.    Disposition:   FU with me in 12 months  Signed,  Lorine Bears, MD  05/24/2023 8:57 AM    [facility]

## 2023-05-24 NOTE — Patient Instructions (Signed)
Medication Instructions:  Your Physician recommend you continue on your current medication as directed.    *If you need a refill on your cardiac medications before your next appointment, please call your pharmacy*   Lab Work: None If you have labs (blood work) drawn today and your tests are completely normal, you will receive your results only by: MyChart Message (if you have MyChart) OR A paper copy in the mail If you have any lab test that is abnormal or we need to change your treatment, we will call you to review the results.   Testing/Procedures: Your physician has requested that you have a lower extremity arterial duplex. During this test, ultrasound is used to evaluate arterial blood flow in the legs. Allow one hour for this exam. There are no restrictions or special instructions. This will take place at [address] [address], [place] [address]   Follow-Up: At [facility], you and your health needs are our priority.  As part of our continuing mission to provide you with exceptional heart care, we have created designated Provider Care Teams.  These Care Teams include your primary Cardiologist (physician) and Advanced Practice Providers (APPs -  Physician Assistants and Nurse Practitioners) who all work together to provide you with the care you need, when you need it.  We recommend signing up for the patient portal called "MyChart".  Sign up information is provided on this After Visit Summary.  MyChart is used to connect with patients for Virtual Visits (Telemedicine).  Patients are able to view lab/test results, encounter notes, upcoming appointments, etc.  Non-urgent messages can be sent to your provider as well.   To learn more about what you can do with MyChart, go to [email or web address].    Your next appointment:   1 year(s)  Provider:   You may see Lorine Bears, MD or one of the following Advanced Practice Providers on your designated  Care Team:

## 2023-06-13 ENCOUNTER — Encounter: Payer: Self-pay | Admitting: Cardiovascular Disease

## 2023-06-13 DIAGNOSIS — M7989 Other specified soft tissue disorders: Secondary | ICD-10-CM

## 2023-06-19 ENCOUNTER — Ambulatory Visit: Payer: Medicare PPO | Attending: Cardiovascular Disease

## 2023-06-19 ENCOUNTER — Other Ambulatory Visit: Payer: Self-pay | Admitting: Cardiovascular Disease

## 2023-06-19 DIAGNOSIS — M79606 Pain in leg, unspecified: Secondary | ICD-10-CM

## 2023-06-19 DIAGNOSIS — M79662 Pain in left lower leg: Secondary | ICD-10-CM

## 2023-06-19 DIAGNOSIS — M79661 Pain in right lower leg: Secondary | ICD-10-CM | POA: Diagnosis not present

## 2023-06-20 LAB — VAS US ABI WITH/WO TBI
Left ABI: 1.25
Right ABI: 1.29

## 2023-06-22 ENCOUNTER — Telehealth: Payer: Self-pay | Admitting: Internal Medicine

## 2023-06-22 NOTE — Telephone Encounter (Signed)
Called pt and advised her that she has had 3 pneumonia vaccines. The last was in January 2019. I said there is a Prevnar 20 that is available, but it was up to her if she wanted to get it.   As for Seeing a provider at the [facility] office, she said she already called asking to be a new pt of either Dr Darrick Huntsman or Dr Lorin Picket and they told her neither are taking new pts. She was hoping for a referral from Dr Alphonsus Sias. I told her when she comes for her next visit with him, she can talk to him about that. It may not make a difference.

## 2023-06-22 NOTE — Telephone Encounter (Signed)
Patient contacted the office wanting to know if she needs to get another shot for pneumonia or shingles? Patient also stated that she would prefer to see either Dr. Darrick Huntsman or Dr. Lorin Picket at [facility] after Dr. Bunnie Domino. Advised pt I would inform Dr. Alphonsus Sias of this but suggested that she should contact [place] [facility] if she would like to schedule an appointment with either provider.

## 2023-06-25 ENCOUNTER — Ambulatory Visit: Payer: Self-pay

## 2023-06-25 NOTE — Patient Instructions (Signed)
Visit Information  Thank you for taking time to visit with me today. Your care coordination goals have been met.   Following are the goals we discussed today:   Goals Addressed             This Visit's Progress    COMPLETED: Development of long term plan of  self management of heart failure/ falls and chronic health conditions       Interventions Today    Flowsheet Row Most Recent Value  Chronic Disease   Chronic disease during today's visit Other, Congestive Heart Failure (CHF)  [LE edema, falls, heel splitting,]  General Interventions   General Interventions Discussed/Reviewed General Interventions Reviewed, Doctor Visits  [evaluation of current treatment plan for mentioned health conditions and patients adherence to plan as established by provider.  Assessed for new or ongoing symptoms, signs of infection to heels, increase swelling or SOB.]  Doctor Visits Discussed/Reviewed Doctor Visits Reviewed  Education Interventions   Education Provided Provided Education  [Advised to monitor for sign's/ symptoms of infection at heel site.  Continue to use lymphedema pumps and leg elevation daily to manage LE swelling.]  Nutrition Interventions   Nutrition Discussed/Reviewed Nutrition Reviewed, Decreasing salt  Pharmacy Interventions   Pharmacy Dicussed/Reviewed Pharmacy Topics Reviewed  Safety Interventions   Safety Discussed/Reviewed Fall Risk  [assessed for falls/ fall prevention discussed.]              Please contact your primary care provider office if care coordination services are needed in the future.   If you are experiencing a Mental Health or Behavioral Health Crisis or need someone to talk to, please call the Suicide and Crisis Lifeline: 988 call 1-800-273-TALK (toll free, 24 hour hotline)  Patient verbalizes understanding of instructions and care plan provided today and agrees to view in MyChart. Active MyChart status and patient understanding of how to access  instructions and care plan via MyChart confirmed with patient.     [name] RN,BSN,CCM [facility] Care Coordination [phone] direct line

## 2023-06-25 NOTE — Patient Outreach (Signed)
  Care Coordination   Follow Up Visit Note   06/25/2023 Name: Alexandria Sherman MRN: [id number] DOB: May 21, 1946  Alexandria Sherman is a 77 y.o. year old female who sees Karie Schwalbe, MD for primary care. I spoke with  [name] by phone today.  What matters to the patients health and wellness today?  Patient states she is doing well.  She reports having follow up visit with her cardiologist on 05/24/23.  Denies any change to treatment plan.   Patient states she is scheduled to see the podiatrist on 07/03/23.  She denies signs of infection at heal sites.  Patient states she elevates her legs and uses her lymphedema pump daily.  Patient denies any falls.  She states she is up to date on her COVID and flu vaccine.    Goals Addressed             This Visit's Progress    COMPLETED: Development of long term plan of  self management of heart failure/ falls and chronic health conditions       Interventions Today    Flowsheet Row Most Recent Value  Chronic Disease   Chronic disease during today's visit Other, Congestive Heart Failure (CHF)  [LE edema, falls, heel splitting,]  General Interventions   General Interventions Discussed/Reviewed General Interventions Reviewed, Doctor Visits  [evaluation of current treatment plan for mentioned health conditions and patients adherence to plan as established by provider.  Assessed for new or ongoing symptoms, signs of infection to heels, increase swelling or SOB.]  Doctor Visits Discussed/Reviewed Doctor Visits Reviewed  Education Interventions   Education Provided Provided Education  [Advised to monitor for sign's/ symptoms of infection at heel site.  Continue to use lymphedema pumps and leg elevation daily to manage LE swelling.]  Nutrition Interventions   Nutrition Discussed/Reviewed Nutrition Reviewed, Decreasing salt  Pharmacy Interventions   Pharmacy Dicussed/Reviewed Pharmacy Topics Reviewed  Safety Interventions   Safety Discussed/Reviewed  Fall Risk  [assessed for falls/ fall prevention discussed.]              SDOH assessments and interventions completed:  No     Care Coordination Interventions:  Yes, provided   Follow up plan: No further intervention required.   Encounter Outcome:  Patient Visit Completed   [name] [facility] Care Coordination [phone] direct line

## 2023-06-26 ENCOUNTER — Other Ambulatory Visit: Payer: Self-pay | Admitting: Internal Medicine

## 2023-06-27 NOTE — Telephone Encounter (Signed)
Last filled 05-17-23 #90 Last OV 12-12-22 Next OV 12-18-23 [facility]

## 2023-07-03 DIAGNOSIS — I872 Venous insufficiency (chronic) (peripheral): Secondary | ICD-10-CM | POA: Diagnosis not present

## 2023-07-03 DIAGNOSIS — M79674 Pain in right toe(s): Secondary | ICD-10-CM | POA: Diagnosis not present

## 2023-07-03 DIAGNOSIS — B351 Tinea unguium: Secondary | ICD-10-CM | POA: Diagnosis not present

## 2023-07-03 DIAGNOSIS — M79675 Pain in left toe(s): Secondary | ICD-10-CM | POA: Diagnosis not present

## 2023-07-13 ENCOUNTER — Telehealth: Payer: Self-pay | Admitting: Internal Medicine

## 2023-07-13 NOTE — Telephone Encounter (Signed)
Patient called in and stated that she would like a phone call in regards to her handicap placard. She stated to call her on her house phone. Thank you!

## 2023-07-13 NOTE — Telephone Encounter (Signed)
Spoke to pt. She is mailing a renewal form she needs signed and mailed back to her.

## 2023-07-31 ENCOUNTER — Telehealth: Payer: Self-pay | Admitting: Internal Medicine

## 2023-07-31 NOTE — Telephone Encounter (Signed)
Pt called in and stated she sent Dr Alphonsus Sias  parking placard paper to fill out pt said she mailed the paperwork and wanted it mailed back to her pt would like a call back at [phone]

## 2023-07-31 NOTE — Telephone Encounter (Signed)
Form was received, signed, and faxed back last week. Spoke to pt to let her know.

## 2023-08-02 ENCOUNTER — Ambulatory Visit: Payer: Medicare PPO | Admitting: Cardiovascular Disease

## 2023-09-17 ENCOUNTER — Other Ambulatory Visit: Payer: Self-pay | Admitting: Internal Medicine

## 2023-10-09 DIAGNOSIS — H401132 Primary open-angle glaucoma, bilateral, moderate stage: Secondary | ICD-10-CM | POA: Diagnosis not present

## 2023-10-09 DIAGNOSIS — H2513 Age-related nuclear cataract, bilateral: Secondary | ICD-10-CM | POA: Diagnosis not present

## 2023-10-19 ENCOUNTER — Other Ambulatory Visit: Payer: Self-pay | Admitting: Internal Medicine

## 2023-11-06 ENCOUNTER — Telehealth: Payer: Self-pay

## 2023-11-06 NOTE — Telephone Encounter (Signed)
Will you schedule for a new pt appt.

## 2023-11-06 NOTE — Telephone Encounter (Signed)
Copied from CRM [id number]. Topic: Appointments - Scheduling Inquiry for Clinic >> Nov 06, 2023 12:30 PM Tiffany H wrote: Reason for CRM: Patient called to request that either Dr. Lorin Picket or Dr. Darrick Huntsman consider taking her on as a new patient. Patient is established with Dr. Alphonsus Sias, who is retiring in the summer.   Please advise if either provider would be willing to establish with patient. If not, please provide recommendation.   Phone: [phone] Please leave a detailed message.

## 2023-11-12 ENCOUNTER — Other Ambulatory Visit: Payer: Self-pay | Admitting: Internal Medicine

## 2023-12-18 ENCOUNTER — Ambulatory Visit (INDEPENDENT_AMBULATORY_CARE_PROVIDER_SITE_OTHER): Payer: Medicare PPO | Admitting: Internal Medicine

## 2023-12-18 ENCOUNTER — Encounter: Payer: Self-pay | Admitting: Internal Medicine

## 2023-12-18 VITALS — BP 118/84 | HR 95 | Temp 98.4°F

## 2023-12-18 DIAGNOSIS — G629 Polyneuropathy, unspecified: Secondary | ICD-10-CM | POA: Diagnosis not present

## 2023-12-18 DIAGNOSIS — K219 Gastro-esophageal reflux disease without esophagitis: Secondary | ICD-10-CM

## 2023-12-18 DIAGNOSIS — E441 Mild protein-calorie malnutrition: Secondary | ICD-10-CM | POA: Diagnosis not present

## 2023-12-18 DIAGNOSIS — F39 Unspecified mood [affective] disorder: Secondary | ICD-10-CM

## 2023-12-18 DIAGNOSIS — I5032 Chronic diastolic (congestive) heart failure: Secondary | ICD-10-CM | POA: Diagnosis not present

## 2023-12-18 DIAGNOSIS — I872 Venous insufficiency (chronic) (peripheral): Secondary | ICD-10-CM | POA: Diagnosis not present

## 2023-12-18 DIAGNOSIS — R5381 Other malaise: Secondary | ICD-10-CM

## 2023-12-18 DIAGNOSIS — Z0000 Encounter for general adult medical examination without abnormal findings: Secondary | ICD-10-CM | POA: Diagnosis not present

## 2023-12-18 LAB — LIPID PANEL
Cholesterol: 169 mg/dL (ref 0–200)
HDL: 72.8 mg/dL (ref >39.00)
LDL Cholesterol: 84 mg/dL (ref 0–99)
NonHDL: 95.82
Total CHOL/HDL Ratio: 2
Triglycerides: 61 mg/dL (ref 0.0–149.0)
VLDL: 12.2 mg/dL (ref 0.0–40.0)

## 2023-12-18 LAB — RENAL FUNCTION PANEL
Albumin: 4 g/dL (ref 3.5–5.2)
BUN: 18 mg/dL (ref 6–23)
CO2: 34 meq/L — ABNORMAL HIGH (ref 19–32)
Calcium: 9.7 mg/dL (ref 8.4–10.5)
Chloride: 101 meq/L (ref 96–112)
Creatinine, Ser: 0.25 mg/dL — ABNORMAL LOW (ref 0.40–1.20)
GFR: 105.63 mL/min (ref >60.00)
Glucose, Bld: 88 mg/dL (ref 70–99)
Phosphorus: 3.1 mg/dL (ref 2.3–4.6)
Potassium: 4.5 meq/L (ref 3.5–5.1)
Sodium: 143 meq/L (ref 135–145)

## 2023-12-18 LAB — CBC
HCT: 42.9 % (ref 36.0–46.0)
Hemoglobin: 13.8 g/dL (ref 12.0–15.0)
MCHC: 32.1 g/dL (ref 30.0–36.0)
MCV: 95.2 fl (ref 78.0–100.0)
Platelets: 225 10*3/uL (ref 150.0–400.0)
RBC: 4.5 Mil/uL (ref 3.87–5.11)
RDW: 13.9 % (ref 11.5–15.5)
WBC: 5.1 10*3/uL (ref 4.0–10.5)

## 2023-12-18 LAB — TSH: TSH: 0.02 u[IU]/mL — ABNORMAL LOW (ref 0.35–5.50)

## 2023-12-18 LAB — HEPATIC FUNCTION PANEL
ALT: 11 U/L (ref 0–35)
AST: 14 U/L (ref 0–37)
Albumin: 4 g/dL (ref 3.5–5.2)
Alkaline Phosphatase: 53 U/L (ref 39–117)
Bilirubin, Direct: 0.1 mg/dL (ref 0.0–0.3)
Total Bilirubin: 0.5 mg/dL (ref 0.2–1.2)
Total Protein: 6.9 g/dL (ref 6.0–8.3)

## 2023-12-18 NOTE — Addendum Note (Signed)
 Addended by: Tillman Abide I on: 12/18/2023 10:59 AM   Modules accepted: Orders

## 2023-12-18 NOTE — Assessment & Plan Note (Signed)
 Chronic depression and anxiety Controlled with the venlafaxine 75 bid and depakote 125 bid Uses the tranxene occaisonally

## 2023-12-18 NOTE — Progress Notes (Signed)
 Subjective:    Patient ID: Alexandria Sherman, female    DOB: Dec 20, 1945, 78 y.o.   MRN: [id number]  HPI Here for Medicare wellness visit and follow up of chronic health conditions Reviewed advanced directives Reviewed other doctors---Dr Dingledein--ophthal, Dr Moshe Cipro, Dr Arida--cardiology, Dr Mercy Riding, [facility] No hospitalizations or surgery in the past year Vision is okay--cataracts and glaucoma Hearing is poor--not ready for aides No alcohol or tobacco Not able to exercise Fell once--knees buckled. Leg bruise. Doesn't get up without assistance No sig memory issues--mild word finding trouble  Ongoing disability--no improvement Neuropathy and weakness continue Still has aides-- 3 hours morning and 3 hours in evening Needs help transferring--doesn't get up unless aide there Unable to use bathroom herself Can walk briefly with walker and assistance  Needs assistance with bathing---just wears housecoat Pays someone to shop for her --friend will cook and do some housework for her Still has long term care insurance  Appetite is improved Can't weigh herself--but doesn't think she lost more  Mood is okay on the medication Chronic anxiety--mostly about money now No daily depression Some anhedonia--but not bad Uses tranxene sporadically--when anxiety really bad  Uses omeprazole prn only No dysphagia  Does get vertigo regularly--meclizine helps quickly  Current Outpatient Medications on File Prior to Visit  Medication Sig Dispense Refill   clorazepate (TRANXENE) 3.75 MG tablet TAKE 1 TABLET BY MOUTH 3 TIMES DAILY AS NEEDED FOR ANXIETY. 90 tablet 0   divalproex (DEPAKOTE) 125 MG DR tablet TAKE 1 TABLET BY MOUTH TWICE DAILY 180 tablet 3   ibuprofen (ADVIL,MOTRIN) 200 MG tablet Take 200 mg by mouth every 6 (six) hours as needed.     latanoprost (XALATAN) 0.005 % ophthalmic solution Place 1 drop into both eyes at bedtime.     meclizine (ANTIVERT) 25 MG tablet  TAKE ONE TABLET DAILY AS NEEDED 90 tablet 1   mometasone (ELOCON) 0.1 % cream Apply 1 Application topically 2 (two) times daily as needed (itching). 45 g 1   omeprazole (PRILOSEC) 20 MG capsule TAKE 1 CAPSULE BY MOUTH ONCE DAILY 90 capsule 3   polyethylene glycol (MIRALAX / GLYCOLAX) 17 g packet Take 17 g by mouth daily as needed for mild constipation. 14 each 0   timolol (TIMOPTIC) 0.5 % ophthalmic solution Place 1 drop into both eyes daily.     venlafaxine (EFFEXOR) 75 MG tablet TAKE ONE TABLET BY MOUTH TWICE DAILY 180 tablet 3   [DISCONTINUED] Calcium Carbonate (CALCIUM 500 PO) Take one by mouth twice a day      No current facility-administered medications on file prior to visit.    Allergies  Allergen Reactions   Augmentin [Amoxicillin-Pot Clavulanate] Other (See Comments)    Pt cannot remember   Cephalexin     REACTION: vaginal blisters Tolerates penicillins   Codeine Phosphate     REACTION: vomiting   Promethazine Hcl     REACTION: nerves    Past Medical History:  Diagnosis Date   Acute lateral meniscus tear of right knee    Anxiety disorder, unspecified    Depression 1978   Diverticulosis of colon 04/2005   GERD (gastroesophageal reflux disease)    Hypertension    Hyperthyroidism    Dr Tedd Sias   Impaired fasting glucose    Low TSH level    chronic low, but euthyroid   Microscopic hematuria    RBBB    S/p left hip fracture    Sleep disturbance    Subclinical hyperthyroidism  unspecified   Tibia fracture 05/2010   left    Past Surgical History:  Procedure Laterality Date   ABDOMINAL HYSTERECTOMY  1988   hysterectomy/ BSO for endometriosis   BREAST BIOPSY Right 1992   neg   BREAST EXCISIONAL BIOPSY Right 1988   benign per pt   INTRAMEDULLARY (IM) NAIL INTERTROCHANTERIC Left 01/15/2022   Procedure: INTRAMEDULLARY (IM) NAIL INTERTROCHANTRIC;  Surgeon: Deeann Saint, MD;  Location: [facility] ORS;  Service: Orthopedics;  Laterality: Left;   KNEE ARTHROSCOPY  6/12    left--Dr Charlann Boxer    Family History  Problem Relation Age of Onset   Cancer Mother        lung   Diabetes Mother    Depression Mother    Depression Sister    Parkinson's disease Sister    Depression Sister    Stroke Other    Heart attack Other    Cancer Other    Dementia Other    Breast cancer Neg Hx    Thyroid disease Neg Hx     Social History   Socioeconomic History   Marital status: Divorced    Spouse name: Not on file   Number of children: 0   Years of education: 16   Highest education level: Bachelor's degree (e.g., BA, AB, BS)  Occupational History   Occupation: Teacher--retired  Tobacco Use   Smoking status: Never    Passive exposure: Past   Smokeless tobacco: Never  Vaping Use   Vaping status: Never Used  Substance and Sexual Activity   Alcohol use: Yes    Comment: Occasional   Drug use: Not on file   Sexual activity: Not Currently  Other Topics Concern   Not on file  Social History Narrative   Has living will   Both sisters have health care POA--- forms in chart   Now has DNR   No tube feeds if cognitively unaware   Social Drivers of Health   Financial Resource Strain: Not on file  Food Insecurity: No Food Insecurity (12/22/2022)   Hunger Vital Sign    Worried About Running Out of Food in the Last Year: Never true    Ran Out of Food in the Last Year: Never true  Transportation Needs: No Transportation Needs (12/22/2022)   PRAPARE - Administrator, Civil Service (Medical): No    Lack of Transportation (Non-Medical): No  Physical Activity: Not on file  Stress: Not on file  Social Connections: Not on file  Intimate Partner Violence: Not on file   Review of Systems Sleeps okay Wears seat belt Teeth okay--keeps up (now has temporary crown) No suspicious skin lesions Bowels move okay--no blood Voids okay--is incontinent (wears pads, etc) No sig back pain. Knees are weak Lying flat she does feel some chest pressure --and SOB on back.  Okay flat on her side No SOB when up with walker Occasional dizziness-no syncope Chronic stable edema--venous insufficiency     Objective:   Physical Exam Constitutional:      Comments: Some wasting Chair bound  HENT:     Mouth/Throat:     Pharynx: No oropharyngeal exudate or posterior oropharyngeal erythema.  Eyes:     Conjunctiva/sclera: Conjunctivae normal.     Pupils: Pupils are equal, round, and reactive to light.  Cardiovascular:     Rate and Rhythm: Normal rate and regular rhythm.     Pulses: Normal pulses.     Heart sounds: No murmur heard.    No gallop.  Pulmonary:  Effort: Pulmonary effort is normal.     Breath sounds: Normal breath sounds. No wheezing or rales.  Abdominal:     Palpations: Abdomen is soft.     Tenderness: There is no abdominal tenderness.  Musculoskeletal:     Cervical back: Neck supple.     Comments: Mild pedal edema  Lymphadenopathy:     Cervical: No cervical adenopathy.  Skin:    Findings: No rash.  Neurological:     Mental Status: She is alert and oriented to person, place, and time.     Comments: Ongoing weakness--arms/hands/legs Mini-cog normal  Psychiatric:        Mood and Affect: Mood normal.        Behavior: Behavior normal.            Assessment & Plan:

## 2023-12-18 NOTE — Assessment & Plan Note (Signed)
 Uses velcro support hose

## 2023-12-18 NOTE — Assessment & Plan Note (Signed)
 Not comfortable supine--but fine on her side (no PND) Seems compensated without Rx now (especially after losing all that weight)

## 2023-12-18 NOTE — Assessment & Plan Note (Signed)
 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

## 2023-12-18 NOTE — Assessment & Plan Note (Signed)
 I have personally reviewed the Medicare Annual Wellness questionnaire and have noted 1. The patient's medical and social history 2. Their use of alcohol, tobacco or illicit drugs 3. Their current medications and supplements 4. The patient's functional ability including ADL's, fall risks, home safety risks and hearing or visual             impairment. 5. Diet and physical activities 6. Evidence for depression or mood disorders  The patients weight, height, BMI and visual acuity have been recorded in the chart I have made referrals, counseling and provided education to the patient based review of the above and I have provided the pt with a written personalized care plan for preventive services.  I have provided you with a copy of your personalized plan for preventive services. Please take the time to review along with your updated medication list.  Done with cancer screening Unable to exercise Due for Td booster Flu/COVID vaccines again in the fall

## 2023-12-18 NOTE — Assessment & Plan Note (Signed)
 Can't get up without assistance--has 2 shifts of aides Help with all ADLs

## 2023-12-18 NOTE — Assessment & Plan Note (Signed)
 Appetite is better Weight has stabilized but well below her normal

## 2023-12-18 NOTE — Assessment & Plan Note (Signed)
 Uses the omeprazole prn

## 2023-12-18 NOTE — Progress Notes (Signed)
 Hearing Screening - Comments:: Aware of hearing loss. Does not have hearing aids.  Vision Screening - Comments:: January 2025

## 2023-12-19 ENCOUNTER — Encounter: Payer: Self-pay | Admitting: Internal Medicine

## 2023-12-19 ENCOUNTER — Other Ambulatory Visit: Payer: Self-pay | Admitting: Internal Medicine

## 2023-12-19 DIAGNOSIS — E0590 Thyrotoxicosis, unspecified without thyrotoxic crisis or storm: Secondary | ICD-10-CM

## 2023-12-19 LAB — VALPROIC ACID LEVEL: Valproic Acid Lvl: 28.7 mg/L — ABNORMAL LOW (ref 50.0–100.0)

## 2023-12-25 ENCOUNTER — Other Ambulatory Visit

## 2023-12-27 ENCOUNTER — Other Ambulatory Visit

## 2023-12-27 DIAGNOSIS — E0590 Thyrotoxicosis, unspecified without thyrotoxic crisis or storm: Secondary | ICD-10-CM

## 2023-12-27 NOTE — Addendum Note (Signed)
 Addended by: Alvina Chou on: 12/27/2023 10:30 AM   Modules accepted: Orders

## 2023-12-27 NOTE — Addendum Note (Signed)
 Addended by: Alvina Chou on: 12/27/2023 09:44 AM   Modules accepted: Orders

## 2024-01-09 ENCOUNTER — Other Ambulatory Visit (INDEPENDENT_AMBULATORY_CARE_PROVIDER_SITE_OTHER)

## 2024-01-09 DIAGNOSIS — E0590 Thyrotoxicosis, unspecified without thyrotoxic crisis or storm: Secondary | ICD-10-CM

## 2024-01-10 LAB — T3: T3, Total: 195 ng/dL — ABNORMAL HIGH (ref 71–180)

## 2024-01-10 LAB — T4, FREE: Free T4: 1.18 ng/dL (ref 0.82–1.77)

## 2024-01-10 LAB — THYROTROPIN RECEPTOR AUTOABS

## 2024-01-10 LAB — TSH: TSH: 0.023 u[IU]/mL — ABNORMAL LOW (ref 0.450–4.500)

## 2024-01-11 ENCOUNTER — Encounter: Payer: Self-pay | Admitting: Internal Medicine

## 2024-01-23 ENCOUNTER — Other Ambulatory Visit: Payer: Self-pay | Admitting: Internal Medicine

## 2024-01-23 NOTE — Telephone Encounter (Signed)
 Last written 06-27-23 #90 Last OV 12-18-23 Next OV 03-11-24 establishing with Dr Madelon Scheuermann [facility]

## 2024-03-11 ENCOUNTER — Ambulatory Visit: Payer: Medicare PPO | Admitting: Internal Medicine

## 2024-03-11 ENCOUNTER — Encounter: Payer: Self-pay | Admitting: Internal Medicine

## 2024-03-11 VITALS — BP 114/76 | HR 79 | Ht 64.0 in | Wt 125.0 lb

## 2024-03-11 DIAGNOSIS — L03115 Cellulitis of right lower limb: Secondary | ICD-10-CM

## 2024-03-11 DIAGNOSIS — I890 Lymphedema, not elsewhere classified: Secondary | ICD-10-CM | POA: Diagnosis not present

## 2024-03-11 MED ORDER — ONDANSETRON HCL 4 MG PO TABS: 4.0000 mg | ORAL_TABLET | Freq: Three times a day (TID) | ORAL | 0 refills | Status: DC | PRN

## 2024-03-11 MED ORDER — DOXYCYCLINE HYCLATE 100 MG PO TABS: 100.0000 mg | ORAL_TABLET | Freq: Two times a day (BID) | ORAL | 0 refills | Status: DC

## 2024-03-11 NOTE — Progress Notes (Signed)
 Subjective:  Patient ID: Alexandria Sherman, female    DOB: 11-28-1945  Age: 78 y.o. MRN: [id number]  CC: The primary encounter diagnosis was Lymphedema. Diagnoses of Cellulitis of right foot and Cellulitis of foot, right were also pertinent to this visit.   HPI Alexandria Sherman presents for  Chief Complaint  Patient presents with   Transfer of Care    78 yr old female with neuropathy and progressive LE weakness  requiring 2 person assistance with all ADL's  presents for transfer of care from  Dr Joelle Musca .  She was last seen in March for CPE .   Patient Has chronic LE edema,  told it was CVI  by Dr. Alvenia Aus,  but has been prescribed suppression stockings and  lymphedema sleeves and advised to use them twice daily for an hour per session.  She is unable to don the stockings so she does not wear them regularly.  She has not been  Using lymphedema sleeves  regularly either.    She is requesting referral to podiatry  for management of overgrown  toenails  and callous softening but has not been able to obtain treatment by [facility] or [facility].  Does not want to see Dr Althea Atkinson. . Was turned down by [facility] .  Has recurrent callouses of heels that crack and bleed periodically.  His toes have become swollen and the toenails are overgrown .     Outpatient Medications Prior to Visit  Medication Sig Dispense Refill   clorazepate  (TRANXENE ) 3.75 MG tablet TAKE 1 TABLET BY MOUTH 3 TIMES DAILY AS NEEDED FOR ANXIETY. 90 tablet 0   divalproex  (DEPAKOTE ) 125 MG DR tablet TAKE 1 TABLET BY MOUTH TWICE DAILY 180 tablet 3   ibuprofen (ADVIL,MOTRIN) 200 MG tablet Take 200 mg by mouth every 6 (six) hours as needed.     latanoprost  (XALATAN ) 0.005 % ophthalmic solution Place 1 drop into both eyes at bedtime.     meclizine  (ANTIVERT ) 25 MG tablet TAKE ONE TABLET DAILY AS NEEDED 90 tablet 1   mometasone  (ELOCON ) 0.1 % cream Apply 1 Application topically 2 (two) times daily as needed (itching). 45 g 1   omeprazole   (PRILOSEC ) 20 MG capsule TAKE 1 CAPSULE BY MOUTH ONCE DAILY 90 capsule 3   polyethylene glycol (MIRALAX  / GLYCOLAX ) 17 g packet Take 17 g by mouth daily as needed for mild constipation. 14 each 0   timolol  (TIMOPTIC ) 0.5 % ophthalmic solution Place 1 drop into both eyes daily.     venlafaxine  (EFFEXOR ) 75 MG tablet TAKE ONE TABLET BY MOUTH TWICE DAILY 180 tablet 3   No facility-administered medications prior to visit.    Review of Systems;  Patient denies headache, fevers, malaise, unintentional weight loss, skin rash, eye pain, sinus congestion and sinus pain, sore throat, dysphagia,  hemoptysis , cough, dyspnea, wheezing, chest pain, palpitations, orthopnea, edema, abdominal pain, nausea, melena, diarrhea, constipation, flank pain, dysuria, hematuria, urinary  Frequency, nocturia, numbness, tingling, seizures,  Focal weakness, Loss of consciousness,  Tremor, insomnia, depression, anxiety, and suicidal ideation.      Objective:  BP 114/76   Pulse 79   Ht 5' 4 (1.626 m)   Wt 125 lb (56.7 kg)   SpO2 95%   BMI 21.46 kg/m   BP Readings from Last 3 Encounters:  03/11/24 114/76  12/18/23 118/84  05/24/23 128/80    Wt Readings from Last 3 Encounters:  03/11/24 125 lb (56.7 kg)  05/24/23 125 lb (56.7  kg)  12/12/22 125 lb (56.7 kg)    Physical Exam Vitals reviewed.  Constitutional:      General: She is not in acute distress.    Appearance: Normal appearance. She is well-developed and normal weight. She is not ill-appearing, toxic-appearing or diaphoretic.     Comments: Seated in a  wheel chair   HENT:     Head: Normocephalic.     Right Ear: Tympanic membrane, ear canal and external ear normal. There is no impacted cerumen.     Left Ear: Tympanic membrane, ear canal and external ear normal. There is no impacted cerumen.     Nose: Nose normal.     Mouth/Throat:     Mouth: Mucous membranes are moist.     Pharynx: Oropharynx is clear.   Eyes:     General: No scleral icterus.        Right eye: No discharge.        Left eye: No discharge.     Conjunctiva/sclera: Conjunctivae normal.     Pupils: Pupils are equal, round, and reactive to light.   Neck:     Thyroid : No thyromegaly.     Vascular: No carotid bruit or JVD.   Cardiovascular:     Rate and Rhythm: Normal rate and regular rhythm.     Heart sounds: Normal heart sounds.  Pulmonary:     Effort: Pulmonary effort is normal. No respiratory distress.     Breath sounds: Normal breath sounds.  Chest:  Breasts:    Breasts are symmetrical.     Right: Normal. No swelling, inverted nipple, mass, nipple discharge, skin change or tenderness.     Left: Normal. No swelling, inverted nipple, mass, nipple discharge, skin change or tenderness.  Abdominal:     General: Bowel sounds are normal.     Palpations: Abdomen is soft. There is no mass.     Tenderness: There is no abdominal tenderness. There is no guarding or rebound.   Musculoskeletal:        General: Normal range of motion.     Cervical back: Normal range of motion and neck supple.  Lymphadenopathy:     Cervical: No cervical adenopathy.     Upper Body:     Right upper body: No supraclavicular, axillary or pectoral adenopathy.     Left upper body: No supraclavicular, axillary or pectoral adenopathy.   Skin:    General: Skin is warm and dry.     Findings: Erythema and rash present. Rash is macular.     Comments: Bilateral erythema  and warmth confined to dorsum of right foot    Neurological:     General: No focal deficit present.     Mental Status: She is alert and oriented to person, place, and time. Mental status is at baseline.   Psychiatric:        Mood and Affect: Mood normal.        Behavior: Behavior normal.        Thought Content: Thought content normal.        Judgment: Judgment normal.    Lab Results  Component Value Date   HGBA1C 5.7 11/22/2020   HGBA1C 5.9 10/22/2017   HGBA1C 6.0 05/02/2011    Lab Results  Component Value Date    CREATININE 0.25 (L) 12/18/2023   CREATININE 0.31 (L) 12/12/2022   CREATININE 0.38 (L) 01/18/2022    Lab Results  Component Value Date   WBC 5.1 12/18/2023   HGB 13.8 12/18/2023  HCT 42.9 12/18/2023   PLT 225.0 12/18/2023   GLUCOSE 88 12/18/2023   CHOL 169 12/18/2023   TRIG 61.0 12/18/2023   HDL 72.80 12/18/2023   LDLDIRECT 140.8 06/15/2009   LDLCALC 84 12/18/2023   ALT 11 12/18/2023   AST 14 12/18/2023   NA 143 12/18/2023   K 4.5 12/18/2023   CL 101 12/18/2023   CREATININE 0.25 (L) 12/18/2023   BUN 18 12/18/2023   CO2 34 (H) 12/18/2023   TSH 0.023 (L) 01/09/2024   INR 1.0 01/15/2022   HGBA1C 5.7 11/22/2020    DG HIP UNILAT WITH PELVIS 2-3 VIEWS LEFT Result Date: 01/15/2022 CLINICAL DATA:  Provided history: Left short IM nail. EXAM: DG HIP (WITH OR WITHOUT PELVIS) 2-3V LEFT COMPARISON:  Radiographs of the left hip 01/14/2022. FINDINGS: Ten intraoperative fluoroscopic images of the left hip are submitted. On the provided images, there are findings of interval placement of an IM nail within the proximal left femur. A proximal interlocking component extends into the left femoral head/neck. An interlocking screw is also present more distally. The hardware traverses a known acute intertrochanteric left hip fracture. No unexpected finding on the provided views. IMPRESSION: Ten intraoperative fluoroscopic images of the left hip, as described. Electronically Signed   By: Bascom Lily D.O.   On: 01/15/2022 16:33   DG C-Arm 1-60 Min-No Report Result Date: 01/15/2022 Fluoroscopy was utilized by the requesting physician.  No radiographic interpretation.   DG Chest 1 View Result Date: 01/15/2022 CLINICAL DATA:  Fall EXAM: CHEST  1 VIEW COMPARISON:  None. FINDINGS: Lungs are clear. Eventration of the right hemidiaphragm. No pleural effusion or pneumothorax. The heart is normal in size. IMPRESSION: No evidence of acute cardiopulmonary disease. Electronically Signed   By: Zadie Herter M.D.    On: 01/15/2022 00:03   DG Hip Unilat W or Wo Pelvis 2-3 Views Left Result Date: 01/15/2022 CLINICAL DATA:  Fall, pain EXAM: DG HIP (WITH OR WITHOUT PELVIS) 2-3V LEFT COMPARISON:  None. FINDINGS: Intertrochanteric left hip fracture with foreshortening and varus angulation. Visualized bony pelvis appears intact. Bilateral hip joint spaces are preserved Surgical clips overlying the left pelvis. IMPRESSION: Intertrochanteric left hip fracture, as above. Electronically Signed   By: Zadie Herter M.D.   On: 01/15/2022 00:02   CT Head Wo Contrast Result Date: 01/14/2022 CLINICAL DATA:  Head trauma.  Mechanical fall admitted. EXAM: CT HEAD WITHOUT CONTRAST TECHNIQUE: Contiguous axial images were obtained from the base of the skull through the vertex without intravenous contrast. RADIATION DOSE REDUCTION: This exam was performed according to the departmental dose-optimization program which includes automated exposure control, adjustment of the mA and/or kV according to patient size and/or use of iterative reconstruction technique. COMPARISON:  MRI examination dated April 17, 2013 FINDINGS: Brain: No evidence of acute infarction, hemorrhage, hydrocephalus, extra-axial collection or mass lesion/mass effect. Low-attenuation of the periventricular and subcortical white matter presumed chronic microvascular ischemic changes. Cerebral atrophy. Vascular: No hyperdense vessel or unexpected calcification. Skull: Normal. Negative for fracture or focal lesion. Sinuses/Orbits: No acute finding. Other: None. IMPRESSION: 1. No acute intracranial abnormality. 2. Cerebral atrophy and chronic microvascular ischemic changes of the white matter. Electronically Signed   By: Imran  Ahmed D.O.   On: 01/14/2022 23:33    Assessment & Plan:  .Lymphedema -     AMB referral to wound care center  Cellulitis of right foot -     Doxycycline Hyclate; Take 1 tablet (100 mg total) by mouth 2 (two) times daily. WITH  FOOD\  Dispense: 14  tablet; Refill: 0 -     AMB referral to wound care center  Cellulitis of foot, right Assessment & Plan: Empiric doxycycline prescribed given cephalexin allergy . Referring to wound care.  Advised to start using lymphedema pumps    Other orders -     Ondansetron  HCl; Take 1 tablet (4 mg total) by mouth every 8 (eight) hours as needed for nausea or vomiting.  Dispense: 20 tablet; Refill: 0     I spent 40 minutes on the day of this face to face encounter reviewing patient's  most recent visit with cardiology,    prior relevant surgical and non surgical procedures, recent  labs and imaging studies, counseling on weight management,  reviewing the assessment and plan with patient, and post visit ordering and reviewing of  diagnostics and therapeutics with patient  .   Follow-up: Return in about 2 weeks (around 03/25/2024) for CELLULITIS .   Thersia Flax, MD

## 2024-03-11 NOTE — Patient Instructions (Addendum)
 IT AS NICE TO MEET YOU TODAY  YOU HAVE AN INFECTION IN YOUR RIGHT FOOT .  IT IS CALLED CELLULITIS .  THIS WAS CAUSED BY THE SKIN SPLITTING APART BECAUSE OF THE SWELLING IN YOUR LEGS   YOU WILL NEED TO TAKE DOXYCYCLINE TWICE DAILY WITH FOOD FOR 7 DAYS     THE ODANSETRON (ZOFRAN ) IS FOR NAUSEA IF YOU DO NOT TOLERATE THE DOXYCYCLINE   YOU NEED TO START USING your lymphedema pumps RELIGIOUSLY!!   TWO TIMES DAILY FOR AT LEAST ONE HOUR .      I WILL MAKE A REFERRAL TO [facility]

## 2024-03-11 NOTE — Assessment & Plan Note (Signed)
 Empiric doxycycline prescribed given cephalexin allergy . Referring to wound care.  Advised to start using lymphedema pumps

## 2024-03-12 ENCOUNTER — Telehealth: Payer: Self-pay | Admitting: Internal Medicine

## 2024-03-12 NOTE — Telephone Encounter (Unsigned)
 Copied from CRM [id number]. Topic: Clinical - Medical Advice >> Mar 12, 2024  1:41 PM Clyde Darling P wrote: Reason for CRM: Pt would like to know if she should keep follow up appt 07/10 or see Dr. Madelon Scheuermann after her wound care appt on 07/11- pt can be reached [phone]

## 2024-03-14 ENCOUNTER — Other Ambulatory Visit: Payer: Self-pay | Admitting: Internal Medicine

## 2024-03-14 MED ORDER — SULFAMETHOXAZOLE-TRIMETHOPRIM 800-160 MG PO TABS: 1.0000 | ORAL_TABLET | Freq: Two times a day (BID) | ORAL | 0 refills | Status: DC

## 2024-03-14 NOTE — Telephone Encounter (Signed)
 Pt is aware of the medication change.

## 2024-03-14 NOTE — Telephone Encounter (Unsigned)
 Copied from CRM [id number]. Topic: Clinical - Medication Question >> Mar 14, 2024 11:36 AM [place] M wrote: Reason for CRM: Patient is unable to take doxycycline (VIBRA-TABS) 100 MG tablet, has not been able to sleep at night, wanting to know if something else can be called in for her

## 2024-03-31 ENCOUNTER — Ambulatory Visit (INDEPENDENT_AMBULATORY_CARE_PROVIDER_SITE_OTHER): Admitting: *Deleted

## 2024-03-31 VITALS — Ht 64.0 in | Wt 130.0 lb

## 2024-03-31 DIAGNOSIS — Z0000 Encounter for general adult medical examination without abnormal findings: Secondary | ICD-10-CM

## 2024-03-31 NOTE — Patient Instructions (Signed)
 [name] , Thank you for taking time out of your busy schedule to complete your Annual Wellness Visit with me. I enjoyed our conversation and look forward to speaking with you again next year. I, as well as your care team,  appreciate your ongoing commitment to your health goals. Please review the following plan we discussed and let me know if I can assist you in the future. Your Game plan/ To Do List    Referrals: If you haven't heard from the office you've been referred to, please reach out to them at the phone provided.  Remember to get your tetanus vaccine at your pharmacy Follow up Visits: Next Medicare AWV with our clinical staff: 04/03/25 @ 10:10   Have you seen your provider in the last 6 months (3 months if uncontrolled diabetes)? Yes Next Office Visit with your provider: 04/03/24  Clinician Recommendations:  Aim for 30 minutes of exercise or brisk walking, 6-8 glasses of water, and 5 servings of fruits and vegetables each day.       This is a list of the screening recommended for you and due dates:  Health Maintenance  Topic Date Due   DTaP/Tdap/Td vaccine (3 - Tdap) 02/17/2019   COVID-19 Vaccine (9 - 2024-25 season) 12/17/2023   Flu Shot  04/25/2024   Medicare Annual Wellness Visit  03/31/2025   Pneumococcal Vaccine for age over 75  Completed   DEXA scan (bone density measurement)  Completed   Hepatitis C Screening  Completed   Zoster (Shingles) Vaccine  Completed   Hepatitis B Vaccine  Aged Out   HPV Vaccine  Aged Out   Meningitis B Vaccine  Aged Out   Colon Cancer Screening  Discontinued    Advanced directives: (Copy Requested) Please bring a copy of your health care power of attorney and living will to the office to be added to your chart at your convenience. You can mail to [facility] [address] [place], [place] [address] or email to [email or web address] Advance Care Planning is important because it:  [x]  Makes sure you receive the  medical care that is consistent with your values, goals, and preferences  [x]  It provides guidance to your family and loved ones and reduces their decisional burden about whether or not they are making the right decisions based on your wishes.

## 2024-03-31 NOTE — Progress Notes (Signed)
 Subjective:   Alexandria Sherman is a 79 y.o. who presents for a Medicare Wellness preventive visit.  As a reminder, Annual Wellness Visits don't include a physical exam, and some assessments may be limited, especially if this visit is performed virtually. We may recommend an in-person follow-up visit with your provider if needed.  Visit Complete: Virtual I connected with  Alexandria Sherman on 03/31/24 by a audio enabled telemedicine application and verified that I am speaking with the correct person using two identifiers.  Patient Location: Home  Provider Location: Home Office  I discussed the limitations of evaluation and management by telemedicine. The patient expressed understanding and agreed to proceed.  Vital Signs: Because this visit was a virtual/telehealth visit, some criteria may be missing or patient reported. Any vitals not documented were not able to be obtained and vitals that have been documented are patient reported.  VideoDeclined- This patient declined Librarian, academic. Therefore the visit was completed with audio only.  Persons Participating in Visit: Patient.  AWV Questionnaire: No: Patient Medicare AWV questionnaire was not completed prior to this visit.  Cardiac Risk Factors include: advanced age (>44men, >88 women);hypertension     Objective:    Today's Vitals   03/31/24 0927  Weight: 130 lb (59 kg)  Height: 5' 4 (1.626 m)   Body mass index is 22.31 kg/m.     03/31/2024    9:43 AM 01/14/2022   10:37 PM 08/13/2017   11:27 AM 07/21/2017   10:33 PM 10/20/2016   11:15 AM 10/20/2016   11:14 AM 10/18/2015   12:01 PM  Advanced Directives  Does Patient Have a Medical Advance Directive? Yes No Yes  No  Yes  No  No   Type of Estate agent of [place];Living will  Out of facility DNR (pink MOST or yellow form)  Out of facility DNR (pink MOST or yellow form)    Does patient want to make changes to medical advance  directive?   No - Patient declined   No - Patient declined     Copy of Healthcare Power of Attorney in Chart? No - copy requested        Would patient like information on creating a medical advance directive?  No - Patient declined    No - Patient declined  Yes - Educational materials given      Data saved with a previous flowsheet row definition    Current Medications (verified) Outpatient Encounter Medications as of 03/31/2024  Medication Sig   clorazepate  (TRANXENE ) 3.75 MG tablet TAKE 1 TABLET BY MOUTH 3 TIMES DAILY AS NEEDED FOR ANXIETY.   divalproex  (DEPAKOTE ) 125 MG DR tablet TAKE 1 TABLET BY MOUTH TWICE DAILY   ibuprofen (ADVIL,MOTRIN) 200 MG tablet Take 200 mg by mouth every 6 (six) hours as needed.   latanoprost  (XALATAN ) 0.005 % ophthalmic solution Place 1 drop into both eyes at bedtime.   meclizine  (ANTIVERT ) 25 MG tablet TAKE ONE TABLET DAILY AS NEEDED   mometasone  (ELOCON ) 0.1 % cream Apply 1 Application topically 2 (two) times daily as needed (itching).   omeprazole  (PRILOSEC ) 20 MG capsule TAKE 1 CAPSULE BY MOUTH ONCE DAILY   polyethylene glycol (MIRALAX  / GLYCOLAX ) 17 g packet Take 17 g by mouth daily as needed for mild constipation.   timolol  (TIMOPTIC ) 0.5 % ophthalmic solution Place 1 drop into both eyes daily.   venlafaxine  (EFFEXOR ) 75 MG tablet TAKE ONE TABLET BY MOUTH TWICE DAILY   ondansetron  (  ZOFRAN ) 4 MG tablet Take 1 tablet (4 mg total) by mouth every 8 (eight) hours as needed for nausea or vomiting. (Patient not taking: Reported on 03/31/2024)   sulfamethoxazole -trimethoprim  (BACTRIM  DS) 800-160 MG tablet Take 1 tablet by mouth 2 (two) times daily. (Patient not taking: Reported on 03/31/2024)   [DISCONTINUED] Calcium  Carbonate (CALCIUM  500 PO) Take one by mouth twice a day    No facility-administered encounter medications on file as of 03/31/2024.    Allergies (verified) Augmentin [amoxicillin-pot clavulanate], Cephalexin, Codeine phosphate, and Promethazine hcl    History: Past Medical History:  Diagnosis Date   Acute lateral meniscus tear of right knee    Anxiety disorder, unspecified    Cataract [facility]   Clotting disorder (HCC) blood circulation/veins in feet and lower legs   Depression 1978   Diverticulosis of colon 04/2005   GERD (gastroesophageal reflux disease)    Glaucoma [facility]   Hypertension    Hyperthyroidism    Dr Damian   Impaired fasting glucose    Low TSH level    chronic low, but euthyroid   Microscopic hematuria    RBBB    S/p left hip fracture    Sleep disturbance    Subclinical hyperthyroidism    unspecified   Tibia fracture 05/2010   left   Past Surgical History:  Procedure Laterality Date   ABDOMINAL HYSTERECTOMY  1988   hysterectomy/ BSO for endometriosis   BREAST BIOPSY Right 1992   neg   BREAST EXCISIONAL BIOPSY Right 1988   benign per pt   INTRAMEDULLARY (IM) NAIL INTERTROCHANTERIC Left 01/15/2022   Procedure: INTRAMEDULLARY (IM) NAIL INTERTROCHANTRIC;  Surgeon: Cleotilde Barrio, MD;  Location: [facility] ORS;  Service: Orthopedics;  Laterality: Left;   KNEE ARTHROSCOPY  6/12   left--Dr Ernie   Family History  Problem Relation Age of Onset   Cancer Mother        lung   Diabetes Mother    Depression Mother    Depression Sister    Parkinson's disease Sister    Cancer Sister    Depression Sister    Stroke Other    Heart attack Other    Cancer Other    Dementia Other    Breast cancer Neg Hx    Thyroid  disease Neg Hx    Social History   Socioeconomic History   Marital status: Divorced    Spouse name: Not on file   Number of children: 0   Years of education: 16   Highest education level: Bachelor's degree (e.g., BA, AB, BS)  Occupational History   Occupation: Teacher--retired  Tobacco Use   Smoking status: Never    Passive exposure: Past   Smokeless tobacco: Never  Vaping Use   Vaping status: Never Used  Substance and Sexual Activity   Alcohol use: Yes    Comment:  Occasional   Drug use: Not on file   Sexual activity: Not Currently  Other Topics Concern   Not on file  Social History Narrative   Has living will   Both sisters have health care POA--- forms in chart   Now has DNR   No tube feeds if cognitively unaware   Social Drivers of Health   Financial Resource Strain: Low Risk  (03/31/2024)   Overall Financial Resource Strain (CARDIA)    Difficulty of Paying Living Expenses: Not hard at all  Food Insecurity: No Food Insecurity (03/31/2024)   Hunger Vital Sign    Worried About Running Out of  Food in the Last Year: Never true    Ran Out of Food in the Last Year: Never true  Transportation Needs: No Transportation Needs (03/31/2024)   PRAPARE - Administrator, Civil Service (Medical): No    Lack of Transportation (Non-Medical): No  Physical Activity: Inactive (03/31/2024)   Exercise Vital Sign    Days of Exercise per Week: 0 days    Minutes of Exercise per Session: 0 min  Stress: Stress Concern Present (03/31/2024)   [employer] of Occupational Health - Occupational Stress Questionnaire    Feeling of Stress: Rather much  Social Connections: Socially Isolated (03/31/2024)   Social Connection and Isolation Panel    Frequency of Communication with Friends and Family: More than three times a week    Frequency of Social Gatherings with Friends and Family: Once a week    Attends Religious Services: Never    Database administrator or Organizations: No    Attends Engineer, structural: Never    Marital Status: Divorced    Tobacco Counseling Counseling given: Not Answered    Clinical Intake:  Pre-visit preparation completed: Yes  Pain : No/denies pain     BMI - recorded: 22.31 Nutritional Status: BMI of 19-24  Normal Nutritional Risks: None Diabetes: No  Lab Results  Component Value Date   HGBA1C 5.7 11/22/2020   HGBA1C 5.9 10/22/2017   HGBA1C 6.0 05/02/2011     How often do you need to have someone help you  when you read instructions, pamphlets, or other written materials from your doctor or pharmacy?: 1 - Never  Interpreter Needed?: No  Information entered by :: R. Gianne Shugars LPN   Activities of Daily Living     03/31/2024    9:29 AM  In your present state of health, do you have any difficulty performing the following activities:  Hearing? 1  Vision? 1  Comment glasses  Difficulty concentrating or making decisions? 1  Walking or climbing stairs? 1  Dressing or bathing? 1  Doing errands, shopping? 1  Preparing Food and eating ? Y  Using the Toilet? Y  In the past six months, have you accidently leaked urine? Y  Do you have problems with loss of bowel control? N  Managing your Medications? N  Managing your Finances? N  Housekeeping or managing your Housekeeping? Y    Patient Care Team: Marylynn Verneita CROME, MD as PCP - General (Internal Medicine) Darron Deatrice LABOR, MD as PCP - Cardiology (Cardiology)  I have updated your Care Teams any recent Medical Services you may have received from other providers in the past year.     Assessment:   This is a routine wellness examination for Alexandria Sherman.  Hearing/Vision screen Hearing Screening - Comments:: Some difficulty hearing Vision Screening - Comments:: glasses   Goals Addressed             This Visit's Progress    Patient Stated       Would like to be more mobile       Depression Screen     03/31/2024    9:37 AM 03/11/2024   10:44 AM 12/18/2023   10:04 AM 12/22/2022   12:03 PM 12/12/2022   10:36 AM 12/12/2022   10:10 AM 12/06/2021   12:33 PM  PHQ 2/9 Scores  PHQ - 2 Score 6 6 0 2 1 1    PHQ- 9 Score 8 11  4      Exception Documentation  Medical reason    Fall Risk     03/31/2024    9:31 AM 03/11/2024   10:44 AM 12/18/2023   10:04 AM 12/20/2022   11:06 AM 12/12/2022   10:36 AM  Fall Risk   Falls in the past year? 1 1 1 1 1   Number falls in past yr: 0 1 0 1 0  Injury with Fall? 1 1 0 1 1  Comment bruises but no medical care  needed      Risk for fall due to : History of fall(s);Impaired balance/gait;Impaired mobility History of fall(s) Impaired mobility Impaired balance/gait;Impaired mobility   Follow up Falls evaluation completed;Falls prevention discussed Falls evaluation completed Falls evaluation completed Education provided;Falls prevention discussed Falls prevention discussed    MEDICARE RISK AT HOME:  Medicare Risk at Home Any stairs in or around the home?: Yes If so, are there any without handrails?: No Home free of loose throw rugs in walkways, pet beds, electrical cords, etc?: Yes Adequate lighting in your home to reduce risk of falls?: Yes Life alert?: No Use of a cane, walker or w/c?: Yes Grab bars in the bathroom?: Yes Shower chair or bench in shower?: Yes Elevated toilet seat or a handicapped toilet?: Yes  TIMED UP AND GO:  Was the test performed?  No  Cognitive Function: 6CIT completed        03/31/2024    9:44 AM  6CIT Screen  What Year? 0 points  What month? 0 points  What time? 0 points  Count back from 20 2 points  Months in reverse 4 points  Repeat phrase 4 points  Total Score 10 points    Immunizations Immunization History  Administered Date(s) Administered   Influenza Split 07/12/2011   Influenza Whole 08/08/2010   Influenza, High Dose Seasonal PF 06/13/2018, 06/16/2019, 06/19/2023   Influenza, Seasonal, Injecte, Preservative Fre 07/15/2015, 06/25/2016   Influenza-Unspecified 06/25/2014, 06/25/2017, 06/13/2018, 05/26/2020, 05/26/2021, 07/13/2022   Moderna Covid-19 Fall Seasonal Vaccine 4yrs & older 08/08/2022, 06/19/2023   PFIZER Comirnaty(Gray Top)Covid-19 Tri-Sucrose Vaccine 02/08/2021   PFIZER(Purple Top)SARS-COV-2 Vaccination 11/07/2019, 11/28/2019, 07/22/2020, 02/08/2021   Pfizer Covid-19 Vaccine Bivalent Booster 37yrs & up 06/07/2021   Pfizer(Comirnaty)Fall Seasonal Vaccine 12 years and older 06/19/2023   Pneumococcal Conjugate-13 10/13/2014   Pneumococcal  Polysaccharide-23 12/13/2009, 10/22/2017   Respiratory Syncytial Virus Vaccine,Recomb Aduvanted(Arexvy) 08/08/2022   Td 07/11/2002, 02/16/2009   Zoster Recombinant(Shingrix) 02/13/2017, 06/19/2017   Zoster, Live 08/08/2010    Screening Tests Health Maintenance  Topic Date Due   DTaP/Tdap/Td (3 - Tdap) 02/17/2019   COVID-19 Vaccine (9 - 2024-25 season) 12/17/2023   INFLUENZA VACCINE  04/25/2024   Medicare Annual Wellness (AWV)  03/31/2025   Pneumococcal Vaccine: 50+ Years  Completed   DEXA SCAN  Completed   Hepatitis C Screening  Completed   Zoster Vaccines- Shingrix  Completed   Hepatitis B Vaccines  Aged Out   HPV VACCINES  Aged Out   Meningococcal B Vaccine  Aged Out   Colonoscopy  Discontinued    Health Maintenance  Health Maintenance Due  Topic Date Due   DTaP/Tdap/Td (3 - Tdap) 02/17/2019   COVID-19 Vaccine (9 - 2024-25 season) 12/17/2023   Health Maintenance Items Addressed: Discussed the need to update Tetanus vaccine, Patient declines Dexa  Additional Screening:  Vision Screening: Recommended annual ophthalmology exams for early detection of glaucoma and other disorders of the eye. Up to date  [facility] Would you like a referral to an eye doctor? No    Dental  Screening: Recommended annual dental exams for proper oral hygiene  Community Resource Referral / Chronic Care Management: CRR required this visit?  No   CCM required this visit?  No   Plan:    I have personally reviewed and noted the following in the patient's chart:   Medical and social history Use of alcohol, tobacco or illicit drugs  Current medications and supplements including opioid prescriptions. Patient is not currently taking opioid prescriptions. Functional ability and status Nutritional status Physical activity Advanced directives List of other physicians Hospitalizations, surgeries, and ER visits in previous 12 months Vitals Screenings to include cognitive, depression, and  falls Referrals and appointments  In addition, I have reviewed and discussed with patient certain preventive protocols, quality metrics, and best practice recommendations. A written personalized care plan for preventive services as well as general preventive health recommendations were provided to patient.   Angeline Fredericks, LPN   10/27/7972   After Visit Summary: (MyChart) Due to this being a telephonic visit, the after visit summary with patients personalized plan was offered to patient via MyChart   Notes: Nothing significant to report at this time.

## 2024-04-03 ENCOUNTER — Encounter: Payer: Self-pay | Admitting: Internal Medicine

## 2024-04-03 ENCOUNTER — Ambulatory Visit: Admitting: Internal Medicine

## 2024-04-03 VITALS — BP 120/72 | HR 95 | Temp 98.1°F | Resp 20 | Ht 64.0 in | Wt 130.0 lb

## 2024-04-03 DIAGNOSIS — Z7189 Other specified counseling: Secondary | ICD-10-CM | POA: Diagnosis not present

## 2024-04-03 DIAGNOSIS — G629 Polyneuropathy, unspecified: Secondary | ICD-10-CM | POA: Diagnosis not present

## 2024-04-03 DIAGNOSIS — L03115 Cellulitis of right lower limb: Secondary | ICD-10-CM | POA: Diagnosis not present

## 2024-04-03 DIAGNOSIS — R5381 Other malaise: Secondary | ICD-10-CM | POA: Diagnosis not present

## 2024-04-03 DIAGNOSIS — I890 Lymphedema, not elsewhere classified: Secondary | ICD-10-CM | POA: Diagnosis not present

## 2024-04-03 NOTE — Patient Instructions (Signed)
 Here are several excellent quality moisturizers for your legs  Eucerin  Cerave Cetaphil  Keep legs elevated as much as possible  Increasing to twice daily   you can also increase the length of pumping to 90 minutes

## 2024-04-03 NOTE — Assessment & Plan Note (Signed)
 She has resumed use of lymphedema pumps with the assistance of her morning aid.  Advised to repeat pumping in the evening with her evening aide

## 2024-04-03 NOTE — Assessment & Plan Note (Signed)
Reviewed patient's  Do Not Resuscitate Orders which were made last year.  Patient continues to desire DNR status and has the order prominently displaced in the home.  

## 2024-04-03 NOTE — Progress Notes (Signed)
 Subjective:  Alexandria Sherman ID: Alexandria Sherman, female    DOB: 1945/11/23  Age: 78 y.o. MRN: [id number]  CC: The primary encounter diagnosis was DNR (do not resuscitate) discussion. Diagnoses of Advance directive discussed with Alexandria Sherman, Multifactorial functional impairment, Neuropathy (HCC), Lymphedema, and Cellulitis of foot, right were also pertinent to this visit.   HPI BRANTLEE HINDE presents for  Chief Complaint  Alexandria Sherman presents with   Medical Management of Chronic Issues    2 week follow up    Alexandria Sherman was seen on Jun 17 for [facility] from retiring physician Dr Jimmy and was diagnosed with cellulitis secondary to poorly controlled lymphedema.  Alexandria Sherman was prewscribed doxycycline  (due to cephalexin allergy) but did not tolerate the medication, which was changed to Septra  DS on Jun 20.   Alexandria Sherman was also referred to [facility] and strongly advised to resume lymphedema management with use of sleeves and pumping twice daily for one hour.  Alexandria Sherman returns today for follow up.  Alexandria Sherman appt with Wound Care is tomorrow, June 11   Alexandria Sherman has a polyneuropathy that has rendered Alexandria Sherman unable to ambulate.  Trouble rising un assisted, requires assistance of 1 aide with a belt  .  Has 2 aides that come in each for a 3 hour shift.  (8 to 11  and 6:16 to 9:15) .Alexandria Sherman is confined to the sofa or the bedwhen the aides are not there .  Does not have a medic alert.  Has bars and a tall seat in the shower.  Has long term care insurance that is about to run out.    Outpatient Medications Prior to Visit  Medication Sig Dispense Refill   clorazepate  (TRANXENE ) 3.75 MG tablet TAKE 1 TABLET BY MOUTH 3 TIMES DAILY AS NEEDED FOR ANXIETY. 90 tablet 0   divalproex  (DEPAKOTE ) 125 MG DR tablet TAKE 1 TABLET BY MOUTH TWICE DAILY 180 tablet 3   ibuprofen (ADVIL,MOTRIN) 200 MG tablet Take 200 mg by mouth every 6 (six) hours as needed.     latanoprost  (XALATAN ) 0.005 % ophthalmic solution Place 1 drop into both eyes at bedtime.     meclizine   (ANTIVERT ) 25 MG tablet TAKE ONE TABLET DAILY AS NEEDED 90 tablet 1   mometasone  (ELOCON ) 0.1 % cream Apply 1 Application topically 2 (two) times daily as needed (itching). 45 g 1   omeprazole  (PRILOSEC ) 20 MG capsule TAKE 1 CAPSULE BY MOUTH ONCE DAILY 90 capsule 3   polyethylene glycol (MIRALAX  / GLYCOLAX ) 17 g packet Take 17 g by mouth daily as needed for mild constipation. 14 each 0   timolol  (TIMOPTIC ) 0.5 % ophthalmic solution Place 1 drop into both eyes daily.     venlafaxine  (EFFEXOR ) 75 MG tablet TAKE ONE TABLET BY MOUTH TWICE DAILY 180 tablet 3   ondansetron  (ZOFRAN ) 4 MG tablet Take 1 tablet (4 mg total) by mouth every 8 (eight) hours as needed for nausea or vomiting. (Alexandria Sherman not taking: Reported on 03/31/2024) 20 tablet 0   sulfamethoxazole -trimethoprim  (BACTRIM  DS) 800-160 MG tablet Take 1 tablet by mouth 2 (two) times daily. (Alexandria Sherman not taking: Reported on 03/31/2024) 14 tablet 0   No facility-administered medications prior to visit.    Review of Systems;  Alexandria Sherman denies headache, fevers, malaise, unintentional weight loss, skin rash, eye pain, sinus congestion and sinus pain, sore throat, dysphagia,  hemoptysis , cough, dyspnea, wheezing, chest pain, palpitations, orthopnea, edema, abdominal pain, nausea, melena, diarrhea, constipation, flank pain, dysuria, hematuria, urinary  Frequency, nocturia, numbness, tingling,  seizures,  Focal weakness, Loss of consciousness,  Tremor, insomnia, depression, anxiety, and suicidal ideation.      Objective:  BP 120/72   Pulse 95   Temp 98.1 F (36.7 C)   Resp 20   Ht 5' 4 (1.626 m)   Wt 130 lb (59 kg)   SpO2 95%   BMI 22.31 kg/m   BP Readings from Last 3 Encounters:  04/03/24 120/72  03/11/24 114/76  12/18/23 118/84    Wt Readings from Last 3 Encounters:  04/03/24 130 lb (59 kg)  03/31/24 130 lb (59 kg)  03/11/24 125 lb (56.7 kg)    Physical Exam Vitals reviewed.  Constitutional:      General: Alexandria Sherman is not in acute  distress.    Appearance: Normal appearance. Alexandria Sherman is normal weight. Alexandria Sherman is not ill-appearing, toxic-appearing or diaphoretic.     Comments: Seated in wheelchair  HENT:     Head: Normocephalic.  Eyes:     General: No scleral icterus.       Right eye: No discharge.        Left eye: No discharge.     Conjunctiva/sclera: Conjunctivae normal.  Cardiovascular:     Rate and Rhythm: Normal rate and regular rhythm.     Heart sounds: Normal heart sounds.  Pulmonary:     Effort: Pulmonary effort is normal. No respiratory distress.     Breath sounds: Normal breath sounds.  Musculoskeletal:        General: Normal range of motion.  Skin:    General: Skin is warm and dry.     Coloration: Skin is sallow.     Findings: Erythema present.     Comments: Skin of lower extremities is purple, shiny, flaking   Neurological:     General: No focal deficit present.     Mental Status: Alexandria Sherman is alert and oriented to person, place, and time. Mental status is at baseline.  Psychiatric:        Mood and Affect: Mood normal.        Behavior: Behavior normal.        Thought Content: Thought content normal.        Judgment: Judgment normal.     Lab Results  Component Value Date   HGBA1C 5.7 11/22/2020   HGBA1C 5.9 10/22/2017   HGBA1C 6.0 05/02/2011    Lab Results  Component Value Date   CREATININE 0.25 (L) 12/18/2023   CREATININE 0.31 (L) 12/12/2022   CREATININE 0.38 (L) 01/18/2022    Lab Results  Component Value Date   WBC 5.1 12/18/2023   HGB 13.8 12/18/2023   HCT 42.9 12/18/2023   PLT 225.0 12/18/2023   GLUCOSE 88 12/18/2023   CHOL 169 12/18/2023   TRIG 61.0 12/18/2023   HDL 72.80 12/18/2023   LDLDIRECT 140.8 06/15/2009   LDLCALC 84 12/18/2023   ALT 11 12/18/2023   AST 14 12/18/2023   NA 143 12/18/2023   K 4.5 12/18/2023   CL 101 12/18/2023   CREATININE 0.25 (L) 12/18/2023   BUN 18 12/18/2023   CO2 34 (H) 12/18/2023   TSH 0.023 (L) 01/09/2024   INR 1.0 01/15/2022   HGBA1C 5.7  11/22/2020    DG HIP UNILAT WITH PELVIS 2-3 VIEWS LEFT Result Date: 01/15/2022 CLINICAL DATA:  Provided history: Left short IM nail. EXAM: DG HIP (WITH OR WITHOUT PELVIS) 2-3V LEFT COMPARISON:  Radiographs of the left hip 01/14/2022. FINDINGS: Ten intraoperative fluoroscopic images of the left hip are submitted. On the  provided images, there are findings of interval placement of an IM nail within the proximal left femur. A proximal interlocking component extends into the left femoral head/neck. An interlocking screw is also present more distally. The hardware traverses a known acute intertrochanteric left hip fracture. No unexpected finding on the provided views. IMPRESSION: Ten intraoperative fluoroscopic images of the left hip, as described. Electronically Signed   By: Rockey Childs D.O.   On: 01/15/2022 16:33   DG C-Arm 1-60 Min-No Report Result Date: 01/15/2022 Fluoroscopy was utilized by the requesting physician.  No radiographic interpretation.   DG Chest 1 View Result Date: 01/15/2022 CLINICAL DATA:  Fall EXAM: CHEST  1 VIEW COMPARISON:  None. FINDINGS: Lungs are clear. Eventration of the right hemidiaphragm. No pleural effusion or pneumothorax. The heart is normal in size. IMPRESSION: No evidence of acute cardiopulmonary disease. Electronically Signed   By: Pinkie Pebbles M.D.   On: 01/15/2022 00:03   DG Hip Unilat W or Wo Pelvis 2-3 Views Left Result Date: 01/15/2022 CLINICAL DATA:  Fall, pain EXAM: DG HIP (WITH OR WITHOUT PELVIS) 2-3V LEFT COMPARISON:  None. FINDINGS: Intertrochanteric left hip fracture with foreshortening and varus angulation. Visualized bony pelvis appears intact. Bilateral hip joint spaces are preserved Surgical clips overlying the left pelvis. IMPRESSION: Intertrochanteric left hip fracture, as above. Electronically Signed   By: Pinkie Pebbles M.D.   On: 01/15/2022 00:02   CT Head Wo Contrast Result Date: 01/14/2022 CLINICAL DATA:  Head trauma.  Mechanical fall  admitted. EXAM: CT HEAD WITHOUT CONTRAST TECHNIQUE: Contiguous axial images were obtained from the base of the skull through the vertex without intravenous contrast. RADIATION DOSE REDUCTION: This exam was performed according to the departmental dose-optimization program which includes automated exposure control, adjustment of the mA and/or kV according to Alexandria Sherman size and/or use of iterative reconstruction technique. COMPARISON:  MRI examination dated April 17, 2013 FINDINGS: Brain: No evidence of acute infarction, hemorrhage, hydrocephalus, extra-axial collection or mass lesion/mass effect. Low-attenuation of the periventricular and subcortical white matter presumed chronic microvascular ischemic changes. Cerebral atrophy. Vascular: No hyperdense vessel or unexpected calcification. Skull: Normal. Negative for fracture or focal lesion. Sinuses/Orbits: No acute finding. Other: None. IMPRESSION: 1. No acute intracranial abnormality. 2. Cerebral atrophy and chronic microvascular ischemic changes of the white matter. Electronically Signed   By: Imran  Ahmed D.O.   On: 01/14/2022 23:33    Assessment & Plan:  .DNR (do not resuscitate) discussion -     Do not attempt resuscitation (DNR)  Advance directive discussed with Alexandria Sherman Assessment & Plan: Reviewed Alexandria Sherman's  Do Not Resuscitate Orders which were made last year.  Alexandria Sherman continues to desire DNR status and has the order prominently displaced in the home.     Multifactorial functional impairment Assessment & Plan: Alexandria Sherman requires assistance rising from bed, couch, and chair ,  showering,  and using a walker.  Alexandria Sherman is basically bedridden when aides are not with Alexandria Sherman due to bilateral leg weakness and risk of fall.  Alexandria Sherman would benefit from a assisted living environment, and at the very least, needs a lift chair to improve mobility .  VBCI referral in progress   Orders: -     AMB Referral VBCI Care Management  Neuropathy [facility]) Assessment & Plan: Etiology  unclear despite neurologic evaluation.  Alexandria Sherman requires assistance with all ADLS  and cannot ambulate without a walker and assistance due to bilateral leg weakness aggravatee by history of hip fracture.   Orders: -  AMB Referral VBCI Care Management  Lymphedema Assessment & Plan: Alexandria Sherman has resumed use of lymphedema pumps with the assistance of Alexandria Sherman morning aid.  Advised to repeat pumping in the evening with Alexandria Sherman evening aide   Orders: -     AMB Referral VBCI Care Management  Cellulitis of foot, right Assessment & Plan: Resolved s/p use of Septra  DS. Caused by skin breakdown from uncontrolled lymphedema      I spent 404 minutes on the day of this face to face encounter reviewing Alexandria Sherman's  most recent visit with cardiology,  prior relevant surgical and non surgical procedures, recent  labs and imaging studies, counseling on edema  management,  reviewing the assessment and plan with Alexandria Sherman, and post visit ordering and reviewing of  diagnostics and therapeutics with Alexandria Sherman  .   Follow-up: No follow-ups on file.   Verneita LITTIE Kettering, MD

## 2024-04-03 NOTE — Assessment & Plan Note (Signed)
 Resolved s/p use of Septra  DS. Caused by skin breakdown from uncontrolled lymphedema

## 2024-04-03 NOTE — Assessment & Plan Note (Signed)
 Etiology unclear despite neurologic evaluation.  She requires assistance with all ADLS  and cannot ambulate without a walker and assistance due to bilateral leg weakness aggravatee by history of hip fracture.

## 2024-04-03 NOTE — Assessment & Plan Note (Signed)
 She requires assistance rising from bed, couch, and chair ,  showering,  and using a walker.  She is basically bedridden when aides are not with her due to bilateral leg weakness and risk of fall.  She would benefit from a assisted living environment, and at the very least, needs a lift chair to improve mobility .  VBCI referral in progress

## 2024-04-04 ENCOUNTER — Telehealth: Payer: Self-pay

## 2024-04-04 ENCOUNTER — Encounter: Attending: Physician Assistant | Admitting: Physician Assistant

## 2024-04-04 DIAGNOSIS — I87333 Chronic venous hypertension (idiopathic) with ulcer and inflammation of bilateral lower extremity: Secondary | ICD-10-CM | POA: Insufficient documentation

## 2024-04-04 DIAGNOSIS — I10 Essential (primary) hypertension: Secondary | ICD-10-CM | POA: Diagnosis not present

## 2024-04-04 DIAGNOSIS — Z09 Encounter for follow-up examination after completed treatment for conditions other than malignant neoplasm: Secondary | ICD-10-CM | POA: Diagnosis not present

## 2024-04-04 DIAGNOSIS — B353 Tinea pedis: Secondary | ICD-10-CM | POA: Diagnosis not present

## 2024-04-04 DIAGNOSIS — I890 Lymphedema, not elsewhere classified: Secondary | ICD-10-CM | POA: Insufficient documentation

## 2024-04-04 DIAGNOSIS — E038 Other specified hypothyroidism: Secondary | ICD-10-CM | POA: Diagnosis not present

## 2024-04-04 NOTE — Progress Notes (Unsigned)
 Complex Care Management Note Care Guide Note  04/04/2024 Name: MERRILLYN ACKERLEY MRN: [id number] DOB: [birth year]   Complex Care Management Outreach Attempts: An unsuccessful telephone outreach was attempted today to offer the patient information about available complex care management services.  Follow Up Plan:  Additional outreach attempts will be made to offer the patient complex care management information and services.   Encounter Outcome:  No Answer  Jeoffrey Buffalo , RMA     [place]  [facility] Guide  Direct Dial: [phone]  Website: [email or web address]

## 2024-04-07 NOTE — Progress Notes (Signed)
 Complex Care Management Note  Care Guide Note 04/07/2024 Name: Alexandria Sherman MRN: [id number] DOB: 11/30/1945  Alexandria Sherman is a 78 y.o. year old female who sees Marylynn, Verneita CROME, MD for primary care. I reached out to [name] by phone today to offer complex care management services.  Ms. Blok was given information about Complex Care Management services today including:   The Complex Care Management services include support from the care team which includes your Nurse Care Manager, Clinical Social Worker, or Pharmacist.  The Complex Care Management team is here to help remove barriers to the health concerns and goals most important to you. Complex Care Management services are voluntary, and the patient may decline or stop services at any time by request to their care team member.   Complex Care Management Consent Status: Patient agreed to services and verbal consent obtained.   Follow up plan:  Telephone appointment with complex care management team member scheduled for:  BSW 04/14/2024 RNCM 04/29/2024  Encounter Outcome:  Patient Scheduled  Jeoffrey Buffalo , RMA     [place]  [facility] Guide  Direct Dial: [phone]  Website: [email or web address]

## 2024-04-09 DIAGNOSIS — H401132 Primary open-angle glaucoma, bilateral, moderate stage: Secondary | ICD-10-CM | POA: Diagnosis not present

## 2024-04-14 ENCOUNTER — Telehealth

## 2024-04-16 ENCOUNTER — Other Ambulatory Visit: Payer: Self-pay

## 2024-04-16 NOTE — Patient Instructions (Signed)
 Visit Information  Thank you for taking time to visit with me today. Please don't hesitate to contact me if I can be of assistance to you before our next scheduled appointment.  Our next appointment is no further scheduled appointments.  Please call the care guide team at [phone] if you need to cancel or reschedule your appointment.   Following is a copy of your care plan:   Goals Addressed   None     Please call the Suicide and Crisis Lifeline: 988 call the [place]  National Suicide Prevention Lifeline: 339-651-0594 or TTY: (314)432-8512 TTY (646)745-5925) to talk to a trained counselor call 1-800-273-TALK (toll free, 24 hour hotline) call 911 if you are experiencing a Mental Health or Behavioral Health Crisis or need someone to talk to.  Patient verbalizes understanding of instructions and care plan provided today and agrees to view in MyChart. Active MyChart status and patient understanding of how to access instructions and care plan via MyChart confirmed with patient.     Thersia Hoar, HEDWIG, MHA [place]  [facility] Social Worker, Population Health [phone]

## 2024-04-16 NOTE — Patient Outreach (Signed)
 Complex Care Management   Visit Note  04/16/2024  Name:  Alexandria Sherman MRN: [id number] DOB: 1946-05-12  Situation: Referral received for Complex Care Management related to Assisted living facility I obtained verbal consent from Patient.  Visit completed with patient  on the phone  Background:   Past Medical History:  Diagnosis Date   Acute lateral meniscus tear of right knee    Anxiety disorder, unspecified    Cataract [facility]   Clotting disorder (HCC) blood circulation/veins in feet and lower legs   Depression 1978   Diverticulosis of colon 04/2005   GERD (gastroesophageal reflux disease)    Glaucoma [facility]   Hypertension    Hyperthyroidism    Dr Damian   Impaired fasting glucose    Low TSH level    chronic low, but euthyroid   Microscopic hematuria    RBBB    S/p left hip fracture    Sleep disturbance    Subclinical hyperthyroidism    unspecified   Tibia fracture 05/2010   left    Assessment: SW completed a telephone outreach with patient, she states her long term care will run out soon. She has already spoken with someone about an assisted living facility in [place] and she would not be able to afford it unless she sells her home in which she is not ready to know. She states she has two aides that come in the home each day one in the morning and one in the evening that her long term care is paying for [facility]. SW informed her insurance does not cover PCS. Patient states she has already tried and does not qualify for Medicaid. Patient states she has no SDOH needs. SW provided patient with telephone number for any needs.  SDOH Interventions    Flowsheet Row Clinical Support from 03/31/2024 in [facility] Coordination from 12/22/2022 in [facility] Coordination Care Coordination from 12/20/2022 in [facility] Care Coordination  SDOH Interventions     Food  Insecurity Interventions Intervention Not Indicated Intervention Not Indicated Intervention Not Indicated  Housing Interventions Intervention Not Indicated Intervention Not Indicated Intervention Not Indicated  Transportation Interventions Intervention Not Indicated Intervention Not Indicated Intervention Not Indicated  Utilities Interventions Intervention Not Indicated -- --  Alcohol Usage Interventions Intervention Not Indicated (Score <7) -- --  Depression Interventions/Treatment  -- --  [currently on medication for depression and anxiety-denies need for mental health follow up] --  Financial Strain Interventions Intervention Not Indicated -- --  Physical Activity Interventions Intervention Not Indicated -- --  Stress Interventions Intervention Not Indicated -- --  Social Connections Interventions Intervention Not Indicated -- --  Health Literacy Interventions Intervention Not Indicated -- --      Recommendation:   No recommendations at this time.  Follow Up Plan:   Patient will continue receiving PCS from her two aides.  Alexandria Sherman, Alexandria Sherman, Alexandria Sherman [place]  [facility] Social Worker, Population Health [phone]

## 2024-04-29 ENCOUNTER — Telehealth

## 2024-05-02 ENCOUNTER — Telehealth: Payer: Self-pay

## 2024-05-02 NOTE — Patient Outreach (Signed)
 Complex Care Management   Visit Note  05/02/2024  Name:  Alexandria Sherman MRN: [id number] DOB: [birth year]  Situation: Referral received for Complex Care Management related to Lymphedema, Depression I obtained verbal consent from Patient.  Visit completed with Patient  on the phone  Background:   Past Medical History:  Diagnosis Date   Acute lateral meniscus tear of right knee    Anxiety disorder, unspecified    Cataract [facility]   Clotting disorder (HCC) blood circulation/veins in feet and lower legs   Depression 1978   Diverticulosis of colon 04/2005   GERD (gastroesophageal reflux disease)    Glaucoma [facility]   Hypertension    Hyperthyroidism    Dr Damian   Impaired fasting glucose    Low TSH level    chronic low, but euthyroid   Microscopic hematuria    RBBB    S/p left hip fracture    Sleep disturbance    Subclinical hyperthyroidism    unspecified   Tibia fracture 05/2010   left    Assessment: Patient Reported Symptoms:  Cognitive Cognitive Status: Alert and oriented to person, place, and time, Struggling with memory recall, Normal speech and language skills, Requires Assistance Decision Making, Insightful and able to interpret abstract concepts Cognitive/Intellectual Conditions Management [RPT]: None reported or documented in medical history or problem list   Health Maintenance Behaviors: Annual physical exam, Healthy diet, Sleep adequate Healing Pattern: Slow Health Facilitated by: Healthy diet, Pain control, Rest  Neurological Neurological Review of Symptoms: Not assessed    HEENT HEENT Symptoms Reported: Not assessed      Cardiovascular Cardiovascular Symptoms Reported: Swelling in legs or feet Cardiovascular Comment: does not weigh - cannot balance on scale, get chest tightness with panic attacks only, chronic lymphedema - has pumps to be used BID  Respiratory Respiratory Symptoms Reported: No symptoms reported Respiratory Management  Strategies: Routine screening  Endocrine Endocrine Symptoms Reported: No symptoms reported Is patient diabetic?: No    Gastrointestinal Gastrointestinal Symptoms Reported: Constipation Gastrointestinal Management Strategies: Medication therapy    Genitourinary Genitourinary Symptoms Reported: Incontinence Genitourinary Management Strategies: Incontinence garment/pad  Integumentary Integumentary Symptoms Reported: Skin changes Additional Integumentary Details: chronic lymphedema - legs and feet dry and flaky - uses vaseline with good results Skin Management Strategies: Routine screening Skin Comment: will call [facility] for Podiatry services, was asking for pedicure services before  Musculoskeletal Musculoskelatal Symptoms Reviewed: Limited mobility, Difficulty walking, Weakness, Unsteady gait Additional Musculoskeletal Details: knees give out - reports frequent falls, elbows and wrists also give out Musculoskeletal Management Strategies: Medication therapy, Routine screening, Medical device Musculoskeletal Self-Management Outcome: 3 (uncertain) Musculoskeletal Comment: reports falling a lot as a child also - unsure why fell when younger Falls in the past year?: Yes Number of falls in past year: 2 or more Was there an injury with Fall?: No Fall Risk Category Calculator: 2 Patient Fall Risk Level: Moderate Fall Risk Patient at Risk for Falls Due to: History of fall(s), Impaired balance/gait, Impaired mobility, Other (Comment) Fall risk Follow up: Falls evaluation completed, Falls prevention discussed  Psychosocial Psychosocial Symptoms Reported: Depression - if selected complete PHQ 2-9 Additional Psychological Details: sttes gets depressed  - this is not what I had in mind for retirement, declines LCSW - talks with sister and HHA Behavioral Management Strategies: Medication therapy Behavioral Health Self-Management Outcome: 3 (uncertain) Major Change/Loss/Stressor/Fears (CP):  Medical condition, self, Resources Behaviors When Feeling Stressed/Fearful: concerned about LTC insurance running out Techniques to [employer]  with Loss/Stress/Change: Medication Quality of Family Relationships: helpful Do you feel physically threatened by others?: No      05/02/2024    3:53 PM  Depression screen PHQ 2/9  Decreased Interest 1  Down, Depressed, Hopeless 2  PHQ - 2 Score 3  Altered sleeping 0  Tired, decreased energy 1  Change in appetite 1  Feeling bad or failure about yourself  1  Trouble concentrating 2  Moving slowly or fidgety/restless 0  Suicidal thoughts 1  PHQ-9 Score 9    There were no vitals filed for this visit.  Medications Reviewed Today     Reviewed by Devra Lands, RN (Registered Nurse) on 05/02/24 at 1533  Med List Status: <None>   Medication Order Taking? Sig Documenting Provider Last Dose Status Informant    Discontinued 10/20/16 1034 (Change in therapy)   clorazepate  (TRANXENE ) 3.75 MG tablet [id number] Yes TAKE 1 TABLET BY MOUTH 3 TIMES DAILY AS NEEDED FOR ANXIETY. Letvak, Richard I, MD  Active   diphenhydrAMINE  (BENADRYL ) 25 MG tablet [id number] Yes Take 25 mg by mouth every 6 (six) hours as needed. [provider]  Active   divalproex  (DEPAKOTE ) 125 MG DR tablet [id number] Yes TAKE 1 TABLET BY MOUTH TWICE DAILY Jimmy Charlie FERNS, MD  Active   ibuprofen (ADVIL,MOTRIN) 200 MG tablet [id number] Yes Take 200 mg by mouth every 6 (six) hours as needed. [provider]  Active Self  latanoprost  (XALATAN ) 0.005 % ophthalmic solution [id number] Yes Place 1 drop into both eyes at bedtime.  Patient taking differently: Place 1 drop into both eyes at bedtime. Does not take regularly - difficulty administering by herself   [provider]  Active Self  meclizine  (ANTIVERT ) 25 MG tablet [id number] Yes TAKE ONE TABLET DAILY AS NEEDED Jimmy Charlie FERNS, MD  Active   mometasone  (ELOCON ) 0.1 % cream [id number] Yes Apply 1 Application topically 2  (two) times daily as needed (itching). Jimmy Charlie FERNS, MD  Active   omeprazole  (PRILOSEC ) 20 MG capsule [id number] Yes TAKE 1 CAPSULE BY MOUTH ONCE DAILY  Patient taking differently: Take 20 mg by mouth daily. Takes as needed   Jimmy Charlie FERNS, MD  Active   polyethylene glycol (MIRALAX  / GLYCOLAX ) 17 g packet [id number] Yes Take 17 g by mouth daily as needed for mild constipation. Alexander, Natalie, DO  Active   timolol  (TIMOPTIC ) 0.5 % ophthalmic solution [id number] Yes Place 1 drop into both eyes daily.  Patient taking differently: Place 1 drop into both eyes daily. Does not take regularly   [provider]  Active Self  venlafaxine  (EFFEXOR ) 75 MG tablet [id number] Yes TAKE ONE TABLET BY MOUTH TWICE DAILY Jimmy Charlie FERNS, MD  Active             Recommendation:   PCP Follow-up Continue Current Plan of Care Make appointment with Podiatrist Dr Gaynel for [facility] - notify of lymphedema and go in wheelchair  Follow Up Plan:   Telephone follow-up 2 Brison Fiumara  Lands Devra, MSN, RN [facility]  [facility], [facility] RN Care Manager Direct Dial: [phone] Fax: [phone]

## 2024-05-02 NOTE — Patient Instructions (Signed)
 Visit Information  Thank you for taking time to visit with me today. Please don't hesitate to contact me if I can be of assistance to you before our next scheduled appointment.  Our next appointment is by telephone on 05/13/2024 at 1:00 pm Please call the care guide team at [phone] if you need to cancel or reschedule your appointment.   Following is a copy of your care plan:   Goals Addressed             This Visit's Progress    VBCI RN Care Plan - Depression       Problems:  Chronic Disease Management support and education needs related to Depression  Goal: Over the next 90 days the Patient will attend all scheduled medical appointments: Patient will attend all appointments as evidenced by chart review        continue to work with RN Care Manager and/or Social Worker to address care management and care coordination needs related to Depression as evidenced by adherence to care management team scheduled appointments     take all medications exactly as prescribed and will call provider for medication related questions as evidenced by Patient verbalizing compliance with all medications    verbalize basic understanding of Depression disease process and self health management plan as evidenced by taking medications as prescribed, considering speaking with LCSW/PCP or other counselor, call 911 for any Suicidal thoughts, determine effective coping mechanisms when feeling depressed, overwhelmed  Interventions:   Evaluation of current treatment plan related to Depression, Level of care concerns and Mental Health Concerns  self-management and patient's adherence to plan as established by provider. Discussed plans with patient for ongoing care management follow up and provided patient with direct contact information for care management team Advised patient to consider conseling with LCSW, call 911 for any suicidal thoughts Provided education to patient re: depression, coping skills Reviewed  medications with patient and discussed importance of not missing any doses Screening for signs and symptoms of depression related to chronic disease state  Assessed social determinant of health barriers  Patient Self-Care Activities:  Attend all scheduled provider appointments Call pharmacy for medication refills 3-7 days in advance of running out of medications Take medications as prescribed    Plan:  Telephone follow up appointment with care management team member scheduled for:  05/13/2024 at 1:00 pm          VBCI RN Care Plan - Lymphedema w/hx Cellulitis and Falls       Problems:  Chronic Disease Management support and education needs related to Lymphedema with history of Cellulitis and Falls  Goal: Over the next 90 days the Patient will attend all scheduled medical appointments: Patient will attend al appointments as evidenced by chart review        continue to work with RN Care Manager and/or Social Worker to address care management and care coordination needs related to Lymphedema  as evidenced by adherence to care management team scheduled appointments     take all medications exactly as prescribed and will call provider for medication related questions as evidenced by Patient verbalizing taking all medications as prescribed    verbalize basic understanding of Lymphedema disease process and self health management plan as evidenced by  Taking all medications as prescribed,  using lymphedema pumps 2x a day for 90 minutes each time as advised by provider, wear compression stockings elevating legs above heart when in bed, doing ankle pumps when sitting, change position frequently when sitting,  apply lotion daily to feet and legs,  check legs and feet daily and call PCP for signs of infection (cellulitis): increased redness, swelling, pain/tenderness, warmth, tightness, shinines, pitting, blisters, fever, chills, fatigue Eat healthy diet - low salt, healthy fats, low sugar and  hydrate daily Patient will have reduced number of falls as evidenced by - patient report  Interventions:   Evaluation of current treatment plan related to Lymphedema, Level of care concerns, Mental Health Concerns , and limites ability to provide self care self-management and patient's adherence to plan as established by provider. Patient declined LCSW Discussed plans with patient for ongoing care management follow up and provided patient with direct contact information for care management team Provided education to patient re: lymphedema, depression fall prevention Reviewed medications with patient and discussed taking all medications as ordered Discussed plans with patient for ongoing care management follow up and provided patient with direct contact information for care management team Screening for signs and symptoms of depression related to chronic disease state  Assessed social determinant of health barriers Fall prevention discussed and mailed.  RNCM requesting rollator from PCP  Patient Self-Care Activities:  Attend all scheduled provider appointments Call pharmacy for medication refills 3-7 days in advance of running out of medications Call provider office for new concerns or questions  Take medications as prescribed    Plan:  Telephone follow up appointment with care management team member scheduled for:  05/13/2024 at 1:00 pm             Please call the Suicide and Crisis Lifeline: 988 call the [place]  National Suicide Prevention Lifeline: 330-769-6457 or TTY: 229-887-6777 TTY 973-638-9395) to talk to a trained counselor call 1-800-273-TALK (toll free, 24 hour hotline) go to [facility] [address], [place] [phone]) call 911 if you are experiencing a Mental Health or Behavioral Health Crisis or need someone to talk to.  The patient verbalized understanding of instructions, educational materials, and care plan provided  today and agreed to receive a mailed copy of patient instructions, educational materials, and care plan.   Nestora Duos, MSN, RN [place]  [facility], [facility] RN Care Manager Direct Dial: [phone] Fax: [phone]  Chronic Venous Insufficiency Chronic venous insufficiency is a condition that causes the veins in the legs to struggle to pump blood from the legs to the heart. It is also called venous stasis. This condition can happen when the vein walls are stretched, weakened, or damaged. It can also happen when the valves inside the vein are damaged. With the right treatment, you should be able to still lead an active life. What are the causes? Common causes of this condition include: Venous hypertension. This is high blood pressure inside the veins. Sitting or standing too long. This can cause increased blood pressure in the veins of the leg. Deep vein thrombosis (DVT). This is a blood clot that blocks blood flow in a vein. Phlebitis. This is inflammation of a vein. It can cause a blood clot to form. An abnormal growth of cells (tumor) in the area between your hip bones (pelvis). This can cause blood to back up. What increases the risk? Factors that may make you more likely to get this condition include: Having a family history of the condition. Being overweight. Being pregnant. Not getting enough exercise. Smoking. Having a job that requires you to sit or stand in one place for a long time. Being a certain age. Females in their 31s and  50s and males in their 48s are more likely to get this condition. What are the signs or symptoms? Symptoms of this condition include: Varicose veins. These are veins that are enlarged, bulging, or twisted. Skin breakdown or ulcers. Reddened skin or dark discoloration of the skin on the leg between the knee and ankle. Lipodermatosclerosis. This is brown, smooth, tight, and painful skin just above the ankle. It is  often on the inside of the leg. Swelling of the legs. How is this diagnosed? This condition may be diagnosed based on your medical history and a physical exam. You may also need tests, such as: A duplex ultrasound. This shows how blood flows through a blood vessel. Plethysmography. This tests blood flow. Venogram. This looks at the veins using an X-ray and dye. How is this treated? The goals of treatment are to help you return to an active life and to relieve pain. Treatment may include: Wearing compression stockings. These do not cure the condition but can help relieve symptoms. They can also help stop your condition from getting worse. Sclerotherapy. This involves injecting a solution to shrink damaged veins. Surgery. This may include: Vein stripping. This is when a diseased vein is taken out. Laser ablation surgery. This is when blood flow is cut off through the vein. Repairing or remaking a valve inside the affected vein. Follow these instructions at home: Lifestyle Do not use any products that contain nicotine or tobacco. These products include cigarettes, chewing tobacco, and vaping devices, such as e-cigarettes. If you need help quitting, ask your health care provider. Stay active. Exercise, walk, or do other activities. Ask your provider what activities are safe for you. General instructions Take over-the-counter and prescription medicines only as told by your provider. Drink enough fluid to keep your pee (urine) pale yellow. Wear compression stockings as told by your provider. These stockings help to prevent blood clots and reduce swelling in your legs. Keep all follow-up visits. Your provider will check your legs for any changes and adjust your treatment plan as needed. Contact a health care provider if: You have redness, swelling, or more pain in the affected area. You see a red streak or line that goes up or down from the area. You have skin breakdown or skin loss. You get an  injury in the affected area. You get a fever. Get help right away if: You have severe pain that does not get better with medicine. You get an injury and an open wound in the affected area. Your foot or ankle becomes numb or weak all of a sudden. You have trouble moving your foot or ankle. Your symptoms do not go away or get worse. You have chest pain. You have shortness of breath. These symptoms may be an emergency. Get help right away. Call 911. Do not wait to see if the symptoms will go away. Do not drive yourself to the hospital. This information is not intended to replace advice given to you by your health care provider. Make sure you discuss any questions you have with your health care provider. Document Revised: 09/26/2022 Document Reviewed: 09/26/2022 Elsevier Patient Education  2024 Else[employer] Inc.  Lymphedema  Lymphedema is swelling that happens when an abnormal amount of lymph collects in the soft tissues under your skin. Lymph is fluid that moves through your lymphatic system. This system: Is part of your body's defense system, also called your immune system. Filters germs and waste from tissues in your body to your bloodstream. Lymphedema happens when  your lymphatic system is blocked. This keeps lymph from draining as it should and leads to swelling. What are the causes? The cause of lymphedema depends on which type you have. Primary lymphedema is when you're born without lymph vessels or with lymph vessels that aren't normal. Secondary lymphedema is more common. It happens when lymph vessels are blocked or damaged from: Infection. Injury. Radiation therapy. Cancer. Scar tissue that forms. Surgery. What are the signs or symptoms? A swollen arm, leg, feet, toes, or fingers. A heavy or tight feeling in the swollen area. Skin that turns red near the swollen area. Not being able to move your arm or leg. Your arm or leg is sensitive to touch. Discomfort in your arm or  leg. How is this diagnosed? Lymphedema may be diagnosed based on: Your symptoms and medical history. A physical exam. Bioimpedance spectroscopy. This test uses painless electrical currents. They help measure fluid levels in your body. Imaging tests, such as: MRI or CT scan. Duplex ultrasound. This test uses sound waves to make pictures on a screen. The pictures show your blood vessels and blood flow. Lymphoscintigraphy. In this test, a low dose of radioactive substance is given through a needle that goes through your skin. The substance traces the flow of lymph through your lymph vessels. Lymphangiography. In this test, a contrast dye is put into your lymph vessel. The dye helps show if the vessel is blocked. How is this treated?  If another condition is causing your lymphedema, that condition will be treated. For example, antibiotics may be used to treat infection. Treatment for lymphedema depends on the cause. Treatment may include: Complete decongestive therapy (CDT). This lowers fluid buildup. CDT includes: Pressure (compression) wrapping of the area. Manual lymph drainage. This helps lymph drain out of your arm or leg. Certain exercises. These help fluid move out of your arm or leg. Compression. This puts pressure on your arm or leg to lower swelling. It includes: Compression stockings or sleeves. Special bandage wraps. Surgery. This is normally done only for severe cases that don't get better with other treatments. Follow these instructions at home: Self-care Your swollen area is more likely to get hurt or infected. To help prevent infection: Keep the area clean and dry. Use creams or lotions that your health care provider says are okay. These keep your skin moist. Protect your skin from cuts. Use gloves when you cook or garden. Do not walk barefoot. If you shave the area, use an Neurosurgeon. Do not wear tight clothes, shoes, or jewelry. Eat a healthy diet. Eat a lot of  fruits and vegetables. Activity Do exercises as told by your provider. Do not sit with your legs crossed. When you can, keep the swollen leg or foot raised above the level of your heart. Avoid using an arm with lymphedema to carry things. General instructions Wear compression stockings or sleeves as told by your provider. Note any changes in size of the swollen arm or leg. You may be told to measure it at set times and track the results. Take over-the-counter and prescription medicines only as told by your provider. If you were prescribed antibiotics, use them as told by your provider. Do not stop using the antibiotic even if you start to feel better or if your condition improves. Do not use heating pads or ice packs on the swollen area. Avoid having your swollen arm or leg used for: Blood draws. IVs. Blood pressure checks. Contact a health care provider if: You get  new swelling in your arm or leg all of a sudden. Fluid leaks from the skin of your swollen arm or leg. You have a cut that doesn't heal. The swollen area hurts or turns red. You get purple spots, a rash, blisters, or sores on your swollen arm or leg. You have a fever or chills. This information is not intended to replace advice given to you by your health care provider. Make sure you discuss any questions you have with your health care provider. Document Revised: 12/06/2022 Document Reviewed: 12/06/2022 Elsevier Patient Education  2024 [employer].  Fall Prevention in the Home, Adult Falls can cause injuries and affect people of all ages. There are many simple things that you can do to make your home safe and to help prevent falls. If you need it, ask for help making these changes. What actions can I take to prevent falls? General information Use good lighting in all rooms. Make sure to: Replace any light bulbs that burn out. Turn on lights if it is dark and use night-lights. Keep items that you use often in  easy-to-reach places. Lower the shelves around your home if needed. Move furniture so that there are clear paths around it. Do not keep throw rugs or other things on the floor that can make you trip. If any of your floors are uneven, fix them. Add color or contrast paint or tape to clearly mark and help you see: Grab bars or handrails. First and last steps of staircases. Where the edge of each step is. If you use a ladder or stepladder: Make sure that it is fully opened. Do not climb a closed ladder. Make sure the sides of the ladder are locked in place. Have someone hold the ladder while you use it. Know where your pets are as you move through your home. What can I do in the bathroom?     Keep the floor dry. Clean up any water that is on the floor right away. Remove soap buildup in the bathtub or shower. Buildup makes bathtubs and showers slippery. Use non-skid mats or decals on the floor of the bathtub or shower. Attach bath mats securely with double-sided, non-slip rug tape. If you need to sit down while you are in the shower, use a non-slip stool. Install grab bars by the toilet and in the bathtub and shower. Do not use towel bars as grab bars. What can I do in the bedroom? Make sure that you have a light by your bed that is easy to reach. Do not use any sheets or blankets on your bed that hang to the floor. Have a firm bench or chair with side arms that you can use for support when you get dressed. What can I do in the kitchen? Clean up any spills right away. If you need to reach something above you, use a sturdy step stool that has a grab bar. Keep electrical cables out of the way. Do not use floor polish or wax that makes floors slippery. What can I do with my stairs? Do not leave anything on the stairs. Make sure that you have a light switch at the top and the bottom of the stairs. Have them installed if you do not have them. Make sure that there are handrails on both sides  of the stairs. Fix handrails that are broken or loose. Make sure that handrails are as long as the staircases. Install non-slip stair treads on all stairs in your home if  they do not have carpet. Avoid having throw rugs at the top or bottom of stairs, or secure the rugs with carpet tape to prevent them from moving. Choose a carpet design that does not hide the edge of steps on the stairs. Make sure that carpet is firmly attached to the stairs. Fix any carpet that is loose or worn. What can I do on the outside of my home? Use bright outdoor lighting. Repair the edges of walkways and driveways and fix any cracks. Clear paths of anything that can make you trip, such as tools or rocks. Add color or contrast paint or tape to clearly mark and help you see high doorway thresholds. Trim any bushes or trees on the main path into your home. Check that handrails are securely fastened and in good repair. Both sides of all steps should have handrails. Install guardrails along the edges of any raised decks or porches. Have leaves, snow, and ice cleared regularly. Use sand, salt, or ice melt on walkways during winter months if you live where there is ice and snow. In the garage, clean up any spills right away, including grease or oil spills. What other actions can I take? Review your medicines with your health care provider. Some medicines can make you confused or feel dizzy. This can increase your chance of falling. Wear closed-toe shoes that fit well and support your feet. Wear shoes that have rubber soles and low heels. Use a cane, walker, scooter, or crutches that help you move around if needed. Talk with your provider about other ways that you can decrease your risk of falls. This may include seeing a physical therapist to learn to do exercises to improve movement and strength. Where to find more information Centers for Disease Control and Prevention, STEAD[employer]: TonerPromos.no [employer] on Aging:  BaseRingTones.pl National Institute on Aging: BaseRingTones.pl Contact a health care provider if: You are afraid of falling at home. You feel weak, drowsy, or dizzy at home. You fall at home. Get help right away if you: Lose consciousness or have trouble moving after a fall. Have a fall that causes a head injury. These symptoms may be an emergency. Get help right away. Call 911. Do not wait to see if the symptoms will go away. Do not drive yourself to the hospital. This information is not intended to replace advice given to you by your health care provider. Make sure you discuss any questions you have with your health care provider. Document Revised: 05/15/2022 Document Reviewed: 05/15/2022 Elsevier Patient Education  2024 Elsevier Inc.  Cellulitis, Adult  Cellulitis is a skin infection. The infected area is often warm, red, swollen, and sore. It occurs most often on the legs, feet, and toes, but can happen on any part of the body. This condition can be life-threatening without treatment. It is very important to get treated right away. What are the causes? This condition is caused by bacteria. The bacteria enter through a break in the skin, such as: A cut. A burn. A bug bite. An animal bite. An open sore. A crack. What increases the risk? Having a weak body's defense system (immune system). Being older than 79 years old. Having a blood sugar problem (diabetes). Having a long-term liver disease (cirrhosis) or kidney disease. Being very overweight (obese). Having a skin problem, such as: An itchy rash. A rash caused by a fungus. A rash with blisters. Slow movement of blood in the veins (venous stasis). Fluid buildup below the skin (edema). This  condition is more likely to occur in people who: Have open cuts, burns, bites, or scrapes on the skin. Have been treated with high-energy rays (radiation). Use IV drugs. What are the signs or symptoms? Skin that: Looks red or purple, or  slightly darker than your usual skin color. Has streaks. Has spots. Is swollen. Is sore or painful when you touch it. Is warm. A fever. Chills. Blisters. Tiredness (fatigue). How is this treated? Medicines to treat infections or allergies. Rest. Placing cold or warm cloths on the skin. Staying in the hospital, if the condition is very bad. You may need medicines through an IV. Follow these instructions at home: Medicines Take over-the-counter and prescription medicines only as told by your doctor. If you were prescribed antibiotics, take them as told by your doctor. Do not stop using them even if you start to feel better. General instructions Drink enough fluid to keep your pee (urine) pale yellow. Do not touch or rub the infected area. Raise (elevate) the infected area above the level of your heart while you are sitting or lying down. Return to your normal activities when your doctor says that it is safe. Place cold or warm cloths on the area as told by your doctor. Keep all follow-up visits. Your doctor will need to make sure that a more serious infection is not developing. Contact a doctor if: You have a fever. You do not start to get better after 1-2 days of treatment. Your bone or joint under the infected area starts to hurt after the skin has healed. Your infection comes back in the same area or another area. Signs of this may include: You have a swollen bump in the area. Your red area gets larger, turns dark in color, or hurts more. You have more fluid coming from the wound. Pus or a bad smell develops in your infected area. You have more pain. You feel sick and have muscle aches and weakness. You develop vomiting or watery poop that will not go away. Get help right away if: You see red streaks coming from the area. You notice the skin turns purple or black and falls off. These symptoms may be an emergency. Get help right away. Call 911. Do not wait to see if the  symptoms will go away. Do not drive yourself to the hospital. This information is not intended to replace advice given to you by your health care provider. Make sure you discuss any questions you have with your health care provider. Document Revised: 05/09/2022 Document Reviewed: 05/09/2022 Elsevier Patient Education  2024 Elsevier Inc.  Managing Depression, Adult Depression is a mental health condition that affects your thoughts, feelings, and actions. Being diagnosed with depression can bring you relief if you did not know why you have felt or behaved a certain way. It could also leave you feeling overwhelmed. Finding ways to manage your symptoms can help you feel more positive about your future. How to manage lifestyle changes Being depressed is difficult. Depression can increase the level of everyday stress. Stress can make depression symptoms worse. You may believe your symptoms cannot be managed or will never improve. However, there are many things you can try to help manage your symptoms. There is hope. Managing stress  Stress is your body's reaction to life changes and events, both good and bad. Stress can add to your feelings of depression. Learning to manage your stress can help lessen your feelings of depression. Try some of the following approaches to reducing  your stress (stress reduction techniques): Listen to music that you enjoy and that inspires you. Try using a meditation app or take a meditation class. Develop a practice that helps you connect with your spiritual self. Walk in nature, pray, or go to a place of worship. Practice deep breathing. To do this, inhale slowly through your nose. Pause at the top of your inhale for a few seconds and then exhale slowly, letting yourself relax. Repeat this three or four times. Practice yoga to help relax and work your muscles. Choose a stress reduction technique that works for you. These techniques take time and practice to develop. Set  aside 5-15 minutes a day to do them. Therapists can offer training in these techniques. Do these things to help manage stress: Keep a journal. Know your limits. Set healthy boundaries for yourself and others, such as saying no when you think something is too much. Pay attention to how you react to certain situations. You may not be able to control everything, but you can change your reaction. Add humor to your life by watching funny movies or shows. Make time for activities that you enjoy and that relax you. Spend less time using electronics, especially at night before bed. The light from screens can make your brain think it is time to get up rather than go to bed.  Medicines Medicines, such as antidepressants, are often a part of treatment for depression. Talk with your pharmacist or health care provider about all the medicines, supplements, and herbal products that you take, their possible side effects, and what medicines and other products are safe to take together. Make sure to report any side effects you may have to your health care provider. Relationships Your health care provider may suggest family therapy, couples therapy, or individual therapy as part of your treatment. How to recognize changes Everyone responds differently to treatment for depression. As you recover from depression, you may start to: Have more interest in doing activities. Feel more hopeful. Have more energy. Eat a more regular amount of food. Have better mental focus. It is important to recognize if your depression is not getting better or is getting worse. The symptoms you had in the beginning may return, such as: Feeling tired. Eating too much or too little. Sleeping too much or too little. Feeling restless, agitated, or hopeless. Trouble focusing or making decisions. Having unexplained aches and pains. Feeling irritable, angry, or aggressive. If you or your family members notice these symptoms coming  back, let your health care provider know right away. Follow these instructions at home: Activity Try to get some form of exercise each day, such as walking. Try yoga, mindfulness, or other stress reduction techniques. Participate in group activities if you are able. Lifestyle Get enough sleep. Cut down on or stop using caffeine, tobacco, alcohol, and any other harmful substances. Eat a healthy diet that includes plenty of vegetables, fruits, whole grains, low-fat dairy products, and lean protein. Limit foods that are high in solid fats, added sugar, or salt (sodium). General instructions Take over-the-counter and prescription medicines only as told by your health care provider. Keep all follow-up visits. It is important for your health care provider to check on your mood, behavior, and medicines. Your health care provider may need to make changes to your treatment. Where to find support Talking to others  Friends and family members can be sources of support and guidance. Talk to trusted friends or family members about your condition. Explain your symptoms and  let them know that you are working with a health care provider to treat your depression. Tell friends and family how they can help. Finances Find mental health providers that fit with your financial situation. Talk with your health care provider if you are worried about access to food, housing, or medicine. Call your insurance company to learn about your co-pays and prescription plan. Where to find more information You can find support in your area from: Anxiety and Depression Association of America (ADAA): adaa.org Mental Health America: mentalhealthamerica.net [employer] on Mental Illness: nami.org Contact a health care provider if: You stop taking your antidepressant medicines, and you have any of these symptoms: Nausea. Headache. Light-headedness. Chills and body aches. Not being able to sleep (insomnia). You or your  friends and family think your depression is getting worse. Get help right away if: You have thoughts of hurting yourself or others. Get help right away if you feel like you may hurt yourself or others, or have thoughts about taking your own life. Go to your nearest emergency room or: Call 911. Call the National Suicide Prevention Lifeline at 631-111-2058 or 988. This is open 24 hours a day. Text the Crisis Text Line at 502-261-4878. This information is not intended to replace advice given to you by your health care provider. Make sure you discuss any questions you have with your health care provider. Document Revised: 01/17/2022 Document Reviewed: 01/17/2022 Elsevier Patient Education  2024 [employer].

## 2024-05-02 NOTE — Addendum Note (Signed)
 Addended by: Kendell Gammon on: 05/02/2024 04:55 PM   Modules accepted: Orders

## 2024-05-10 ENCOUNTER — Other Ambulatory Visit: Payer: Self-pay

## 2024-05-10 ENCOUNTER — Emergency Department

## 2024-05-10 ENCOUNTER — Inpatient Hospital Stay
Admission: EM | Admit: 2024-05-10 | Discharge: 2024-05-21 | DRG: 871 | Disposition: A | Discharge status: Home Health | Attending: Internal Medicine | Admitting: Internal Medicine

## 2024-05-10 ENCOUNTER — Encounter: Payer: Self-pay | Admitting: Emergency Medicine

## 2024-05-10 DIAGNOSIS — M19042 Primary osteoarthritis, left hand: Secondary | ICD-10-CM | POA: Diagnosis present

## 2024-05-10 DIAGNOSIS — E0590 Thyrotoxicosis, unspecified without thyrotoxic crisis or storm: Secondary | ICD-10-CM | POA: Diagnosis present

## 2024-05-10 DIAGNOSIS — Z66 Do not resuscitate: Secondary | ICD-10-CM | POA: Diagnosis present

## 2024-05-10 DIAGNOSIS — I70212 Atherosclerosis of native arteries of extremities with intermittent claudication, left leg: Secondary | ICD-10-CM | POA: Diagnosis not present

## 2024-05-10 DIAGNOSIS — I480 Paroxysmal atrial fibrillation: Secondary | ICD-10-CM | POA: Diagnosis not present

## 2024-05-10 DIAGNOSIS — I5030 Unspecified diastolic (congestive) heart failure: Secondary | ICD-10-CM | POA: Diagnosis not present

## 2024-05-10 DIAGNOSIS — I890 Lymphedema, not elsewhere classified: Secondary | ICD-10-CM | POA: Diagnosis present

## 2024-05-10 DIAGNOSIS — D7589 Other specified diseases of blood and blood-forming organs: Secondary | ICD-10-CM | POA: Diagnosis not present

## 2024-05-10 DIAGNOSIS — E873 Alkalosis: Secondary | ICD-10-CM | POA: Diagnosis not present

## 2024-05-10 DIAGNOSIS — J9811 Atelectasis: Secondary | ICD-10-CM | POA: Diagnosis not present

## 2024-05-10 DIAGNOSIS — Z602 Problems related to living alone: Secondary | ICD-10-CM | POA: Diagnosis present

## 2024-05-10 DIAGNOSIS — A4151 Sepsis due to Escherichia coli [E. coli]: Principal | ICD-10-CM | POA: Diagnosis present

## 2024-05-10 DIAGNOSIS — I4891 Unspecified atrial fibrillation: Secondary | ICD-10-CM

## 2024-05-10 DIAGNOSIS — R918 Other nonspecific abnormal finding of lung field: Secondary | ICD-10-CM | POA: Diagnosis not present

## 2024-05-10 DIAGNOSIS — Z881 Allergy status to other antibiotic agents status: Secondary | ICD-10-CM

## 2024-05-10 DIAGNOSIS — I872 Venous insufficiency (chronic) (peripheral): Secondary | ICD-10-CM | POA: Diagnosis present

## 2024-05-10 DIAGNOSIS — J432 Centrilobular emphysema: Secondary | ICD-10-CM | POA: Diagnosis not present

## 2024-05-10 DIAGNOSIS — R0682 Tachypnea, not elsewhere classified: Secondary | ICD-10-CM | POA: Diagnosis not present

## 2024-05-10 DIAGNOSIS — R531 Weakness: Secondary | ICD-10-CM

## 2024-05-10 DIAGNOSIS — Z7901 Long term (current) use of anticoagulants: Secondary | ICD-10-CM

## 2024-05-10 DIAGNOSIS — R0602 Shortness of breath: Secondary | ICD-10-CM

## 2024-05-10 DIAGNOSIS — K219 Gastro-esophageal reflux disease without esophagitis: Secondary | ICD-10-CM | POA: Diagnosis present

## 2024-05-10 DIAGNOSIS — Z809 Family history of malignant neoplasm, unspecified: Secondary | ICD-10-CM

## 2024-05-10 DIAGNOSIS — I5022 Chronic systolic (congestive) heart failure: Secondary | ICD-10-CM | POA: Diagnosis not present

## 2024-05-10 DIAGNOSIS — E042 Nontoxic multinodular goiter: Secondary | ICD-10-CM | POA: Diagnosis not present

## 2024-05-10 DIAGNOSIS — Z818 Family history of other mental and behavioral disorders: Secondary | ICD-10-CM

## 2024-05-10 DIAGNOSIS — I482 Chronic atrial fibrillation, unspecified: Secondary | ICD-10-CM | POA: Diagnosis present

## 2024-05-10 DIAGNOSIS — I10 Essential (primary) hypertension: Secondary | ICD-10-CM | POA: Diagnosis not present

## 2024-05-10 DIAGNOSIS — F05 Delirium due to known physiological condition: Secondary | ICD-10-CM | POA: Diagnosis not present

## 2024-05-10 DIAGNOSIS — F419 Anxiety disorder, unspecified: Secondary | ICD-10-CM | POA: Diagnosis present

## 2024-05-10 DIAGNOSIS — F32A Depression, unspecified: Secondary | ICD-10-CM | POA: Diagnosis not present

## 2024-05-10 DIAGNOSIS — H409 Unspecified glaucoma: Secondary | ICD-10-CM | POA: Diagnosis present

## 2024-05-10 DIAGNOSIS — J9601 Acute respiratory failure with hypoxia: Secondary | ICD-10-CM | POA: Diagnosis present

## 2024-05-10 DIAGNOSIS — Z886 Allergy status to analgesic agent status: Secondary | ICD-10-CM

## 2024-05-10 DIAGNOSIS — Z8249 Family history of ischemic heart disease and other diseases of the circulatory system: Secondary | ICD-10-CM

## 2024-05-10 DIAGNOSIS — I739 Peripheral vascular disease, unspecified: Secondary | ICD-10-CM | POA: Diagnosis present

## 2024-05-10 DIAGNOSIS — I4510 Unspecified right bundle-branch block: Secondary | ICD-10-CM | POA: Diagnosis present

## 2024-05-10 DIAGNOSIS — Z1152 Encounter for screening for COVID-19: Secondary | ICD-10-CM | POA: Diagnosis not present

## 2024-05-10 DIAGNOSIS — S52501A Unspecified fracture of the lower end of right radius, initial encounter for closed fracture: Secondary | ICD-10-CM | POA: Diagnosis not present

## 2024-05-10 DIAGNOSIS — Z7401 Bed confinement status: Secondary | ICD-10-CM | POA: Diagnosis not present

## 2024-05-10 DIAGNOSIS — A419 Sepsis, unspecified organism: Secondary | ICD-10-CM | POA: Diagnosis not present

## 2024-05-10 DIAGNOSIS — E785 Hyperlipidemia, unspecified: Secondary | ICD-10-CM | POA: Diagnosis not present

## 2024-05-10 DIAGNOSIS — E876 Hypokalemia: Secondary | ICD-10-CM | POA: Diagnosis not present

## 2024-05-10 DIAGNOSIS — I4819 Other persistent atrial fibrillation: Secondary | ICD-10-CM | POA: Diagnosis not present

## 2024-05-10 DIAGNOSIS — J90 Pleural effusion, not elsewhere classified: Secondary | ICD-10-CM | POA: Diagnosis not present

## 2024-05-10 DIAGNOSIS — I491 Atrial premature depolarization: Secondary | ICD-10-CM | POA: Diagnosis not present

## 2024-05-10 DIAGNOSIS — N390 Urinary tract infection, site not specified: Secondary | ICD-10-CM | POA: Diagnosis not present

## 2024-05-10 DIAGNOSIS — M79604 Pain in right leg: Secondary | ICD-10-CM

## 2024-05-10 DIAGNOSIS — E039 Hypothyroidism, unspecified: Secondary | ICD-10-CM | POA: Diagnosis not present

## 2024-05-10 DIAGNOSIS — I2510 Atherosclerotic heart disease of native coronary artery without angina pectoris: Secondary | ICD-10-CM | POA: Diagnosis present

## 2024-05-10 DIAGNOSIS — Z5941 Food insecurity: Secondary | ICD-10-CM | POA: Diagnosis not present

## 2024-05-10 DIAGNOSIS — M19041 Primary osteoarthritis, right hand: Secondary | ICD-10-CM | POA: Diagnosis present

## 2024-05-10 DIAGNOSIS — J9600 Acute respiratory failure, unspecified whether with hypoxia or hypercapnia: Secondary | ICD-10-CM | POA: Diagnosis not present

## 2024-05-10 DIAGNOSIS — Z801 Family history of malignant neoplasm of trachea, bronchus and lung: Secondary | ICD-10-CM

## 2024-05-10 DIAGNOSIS — R221 Localized swelling, mass and lump, neck: Secondary | ICD-10-CM | POA: Diagnosis not present

## 2024-05-10 DIAGNOSIS — I5031 Acute diastolic (congestive) heart failure: Secondary | ICD-10-CM | POA: Diagnosis not present

## 2024-05-10 DIAGNOSIS — R410 Disorientation, unspecified: Secondary | ICD-10-CM

## 2024-05-10 DIAGNOSIS — I517 Cardiomegaly: Secondary | ICD-10-CM | POA: Diagnosis not present

## 2024-05-10 DIAGNOSIS — Z79899 Other long term (current) drug therapy: Secondary | ICD-10-CM

## 2024-05-10 DIAGNOSIS — Z820 Family history of epilepsy and other diseases of the nervous system: Secondary | ICD-10-CM

## 2024-05-10 DIAGNOSIS — I110 Hypertensive heart disease with heart failure: Secondary | ICD-10-CM | POA: Diagnosis present

## 2024-05-10 DIAGNOSIS — M7989 Other specified soft tissue disorders: Secondary | ICD-10-CM

## 2024-05-10 DIAGNOSIS — M7122 Synovial cyst of popliteal space [Baker], left knee: Secondary | ICD-10-CM | POA: Diagnosis not present

## 2024-05-10 DIAGNOSIS — Z833 Family history of diabetes mellitus: Secondary | ICD-10-CM

## 2024-05-10 DIAGNOSIS — Z823 Family history of stroke: Secondary | ICD-10-CM

## 2024-05-10 DIAGNOSIS — D3502 Benign neoplasm of left adrenal gland: Secondary | ICD-10-CM | POA: Diagnosis not present

## 2024-05-10 DIAGNOSIS — Z723 Lack of physical exercise: Secondary | ICD-10-CM

## 2024-05-10 DIAGNOSIS — I499 Cardiac arrhythmia, unspecified: Secondary | ICD-10-CM | POA: Diagnosis not present

## 2024-05-10 DIAGNOSIS — Z90710 Acquired absence of both cervix and uterus: Secondary | ICD-10-CM

## 2024-05-10 DIAGNOSIS — R9389 Abnormal findings on diagnostic imaging of other specified body structures: Secondary | ICD-10-CM

## 2024-05-10 LAB — RESP PANEL BY RT-PCR (RSV, FLU A&B, COVID)  RVPGX2
Influenza A by PCR: NEGATIVE
Influenza B by PCR: NEGATIVE
Resp Syncytial Virus by PCR: NEGATIVE
SARS Coronavirus 2 by RT PCR: NEGATIVE

## 2024-05-10 LAB — MAGNESIUM: Magnesium: 2.3 mg/dL (ref 1.7–2.4)

## 2024-05-10 LAB — CBC
HCT: 46.8 % — ABNORMAL HIGH (ref 36.0–46.0)
Hemoglobin: 14.8 g/dL (ref 12.0–15.0)
MCH: 30.2 pg (ref 26.0–34.0)
MCHC: 31.6 g/dL (ref 30.0–36.0)
MCV: 95.5 fL (ref 80.0–100.0)
Platelets: 395 K/uL (ref 150–400)
RBC: 4.9 MIL/uL (ref 3.87–5.11)
RDW: 14.4 % (ref 11.5–15.5)
WBC: 10.8 K/uL — ABNORMAL HIGH (ref 4.0–10.5)
nRBC: 0 % (ref 0.0–0.2)

## 2024-05-10 LAB — COMPREHENSIVE METABOLIC PANEL WITH GFR
ALT: 19 U/L (ref 0–44)
AST: 19 U/L (ref 15–41)
Albumin: 2.8 g/dL — ABNORMAL LOW (ref 3.5–5.0)
Alkaline Phosphatase: 74 U/L (ref 38–126)
Anion gap: 13 (ref 5–15)
BUN: 39 mg/dL — ABNORMAL HIGH (ref 8–23)
CO2: 28 mmol/L (ref 22–32)
Calcium: 9.7 mg/dL (ref 8.9–10.3)
Chloride: 98 mmol/L (ref 98–111)
Creatinine, Ser: 0.58 mg/dL (ref 0.44–1.00)
GFR, Estimated: >60 mL/min (ref >60)
Glucose, Bld: 167 mg/dL — ABNORMAL HIGH (ref 70–99)
Potassium: 3 mmol/L — ABNORMAL LOW (ref 3.5–5.1)
Sodium: 139 mmol/L (ref 135–145)
Total Bilirubin: 0.8 mg/dL (ref 0.0–1.2)
Total Protein: 6.7 g/dL (ref 6.5–8.1)

## 2024-05-10 LAB — APTT: aPTT: 37 s — ABNORMAL HIGH (ref 24–36)

## 2024-05-10 LAB — GLUCOSE, CAPILLARY: Glucose-Capillary: 93 mg/dL (ref 70–99)

## 2024-05-10 LAB — PROTIME-INR
INR: 1.1 (ref 0.8–1.2)
Prothrombin Time: 14.8 s (ref 11.4–15.2)

## 2024-05-10 LAB — LACTIC ACID, PLASMA: Lactic Acid, Venous: 1.9 mmol/L (ref 0.5–1.9)

## 2024-05-10 LAB — URINALYSIS, ROUTINE W REFLEX MICROSCOPIC
Bilirubin Urine: NEGATIVE
Glucose, UA: NEGATIVE mg/dL
Ketones, ur: NEGATIVE mg/dL
Nitrite: NEGATIVE
Protein, ur: 100 mg/dL — AB
Specific Gravity, Urine: 1.03 (ref 1.005–1.030)
WBC, UA: >50 WBC/hpf (ref 0–5)
pH: 6 (ref 5.0–8.0)

## 2024-05-10 LAB — BRAIN NATRIURETIC PEPTIDE: B Natriuretic Peptide: 277.6 pg/mL — ABNORMAL HIGH (ref 0.0–100.0)

## 2024-05-10 LAB — TSH: TSH: 0.027 u[IU]/mL — ABNORMAL LOW (ref 0.350–4.500)

## 2024-05-10 LAB — T4, FREE: Free T4: 1.33 ng/dL — ABNORMAL HIGH (ref 0.61–1.12)

## 2024-05-10 LAB — MRSA NEXT GEN BY PCR, NASAL: MRSA by PCR Next Gen: NOT DETECTED

## 2024-05-10 LAB — TROPONIN I (HIGH SENSITIVITY): Troponin I (High Sensitivity): 16 ng/L (ref <18)

## 2024-05-10 MED ORDER — NYSTATIN 100000 UNIT/GM EX POWD
1.0000 | Freq: Three times a day (TID) | CUTANEOUS | Status: DC
  Administered 2024-05-10 – 2024-05-11 (×2): 1 via TOPICAL
  Filled 2024-05-10: qty 15

## 2024-05-10 MED ORDER — POTASSIUM CHLORIDE 10 MEQ/100ML IV SOLN
10.0000 meq | INTRAVENOUS | Status: AC
  Administered 2024-05-10 (×3): 10 meq via INTRAVENOUS
  Filled 2024-05-10 (×3): qty 100

## 2024-05-10 MED ORDER — ACETAMINOPHEN 650 MG RE SUPP: 650.0000 mg | Freq: Four times a day (QID) | RECTAL | Status: DC | PRN

## 2024-05-10 MED ORDER — CHLORHEXIDINE GLUCONATE CLOTH 2 % EX PADS
6.0000 | MEDICATED_PAD | Freq: Every day | CUTANEOUS | Status: DC
  Administered 2024-05-10 – 2024-05-20 (×8): 6 via TOPICAL

## 2024-05-10 MED ORDER — DIVALPROEX SODIUM 125 MG PO DR TAB
125.0000 mg | DELAYED_RELEASE_TABLET | Freq: Two times a day (BID) | ORAL | Status: DC
  Administered 2024-05-10 – 2024-05-21 (×22): 125 mg via ORAL
  Filled 2024-05-10 (×25): qty 1

## 2024-05-10 MED ORDER — HEPARIN BOLUS VIA INFUSION
3000.0000 [IU] | Freq: Once | INTRAVENOUS | Status: AC
  Administered 2024-05-10: 3000 [IU] via INTRAVENOUS
  Filled 2024-05-10: qty 3000

## 2024-05-10 MED ORDER — SODIUM CHLORIDE 0.9 % IV SOLN
1.0000 g | Freq: Three times a day (TID) | INTRAVENOUS | Status: DC
  Administered 2024-05-10 – 2024-05-12 (×5): 1 g via INTRAVENOUS
  Filled 2024-05-10 (×6): qty 5

## 2024-05-10 MED ORDER — DILTIAZEM HCL-DEXTROSE 125-5 MG/125ML-% IV SOLN (PREMIX)
5.0000 mg/h | INTRAVENOUS | Status: DC
  Administered 2024-05-10: 5 mg/h via INTRAVENOUS
  Administered 2024-05-11 – 2024-05-12 (×3): 10 mg/h via INTRAVENOUS
  Filled 2024-05-10 (×4): qty 125

## 2024-05-10 MED ORDER — ONDANSETRON HCL 4 MG/2ML IJ SOLN: 4.0000 mg | Freq: Four times a day (QID) | INTRAMUSCULAR | Status: DC | PRN

## 2024-05-10 MED ORDER — DILTIAZEM HCL 25 MG/5ML IV SOLN
10.0000 mg | INTRAVENOUS | Status: AC
  Administered 2024-05-10: 10 mg via INTRAVENOUS
  Filled 2024-05-10: qty 5

## 2024-05-10 MED ORDER — ACETAMINOPHEN 325 MG PO TABS
650.0000 mg | ORAL_TABLET | Freq: Four times a day (QID) | ORAL | Status: DC | PRN
  Filled 2024-05-10: qty 2

## 2024-05-10 MED ORDER — HEPARIN (PORCINE) 25000 UT/250ML-% IV SOLN
1000.0000 [IU]/h | INTRAVENOUS | Status: AC
  Administered 2024-05-10: 800 [IU]/h via INTRAVENOUS
  Administered 2024-05-11 – 2024-05-12 (×2): 1000 [IU]/h via INTRAVENOUS
  Filled 2024-05-10 (×3): qty 250

## 2024-05-10 MED ORDER — POTASSIUM CHLORIDE 10 MEQ/100ML IV SOLN
10.0000 meq | INTRAVENOUS | Status: AC
  Administered 2024-05-10 (×2): 10 meq via INTRAVENOUS
  Filled 2024-05-10 (×2): qty 100

## 2024-05-10 MED ORDER — METOPROLOL TARTRATE 25 MG PO TABS
12.5000 mg | ORAL_TABLET | Freq: Two times a day (BID) | ORAL | Status: DC
  Administered 2024-05-10 – 2024-05-11 (×3): 12.5 mg via ORAL
  Filled 2024-05-10 (×3): qty 1

## 2024-05-10 MED ORDER — IOHEXOL 350 MG/ML SOLN
80.0000 mL | Freq: Once | INTRAVENOUS | Status: AC | PRN
  Administered 2024-05-10: 80 mL via INTRAVENOUS

## 2024-05-10 MED ORDER — ASPIRIN 81 MG PO TBEC
81.0000 mg | DELAYED_RELEASE_TABLET | Freq: Every day | ORAL | Status: DC
  Administered 2024-05-10 – 2024-05-12 (×3): 81 mg via ORAL
  Filled 2024-05-10 (×3): qty 1

## 2024-05-10 MED ORDER — POLYETHYLENE GLYCOL 3350 17 G PO PACK
17.0000 g | PACK | Freq: Every day | ORAL | Status: DC | PRN
  Administered 2024-05-18: 17 g via ORAL
  Filled 2024-05-10: qty 1

## 2024-05-10 MED ORDER — ONDANSETRON HCL 4 MG PO TABS: 4.0000 mg | ORAL_TABLET | Freq: Four times a day (QID) | ORAL | Status: DC | PRN

## 2024-05-10 MED ORDER — SODIUM CHLORIDE 0.9 % IV SOLN
2.0000 g | Freq: Once | INTRAVENOUS | Status: AC
  Administered 2024-05-10: 2 g via INTRAVENOUS
  Filled 2024-05-10: qty 10

## 2024-05-10 MED ORDER — PANTOPRAZOLE SODIUM 40 MG PO TBEC
40.0000 mg | DELAYED_RELEASE_TABLET | Freq: Every day | ORAL | Status: DC
  Administered 2024-05-10 – 2024-05-21 (×12): 40 mg via ORAL
  Filled 2024-05-10 (×12): qty 1

## 2024-05-10 NOTE — ED Triage Notes (Signed)
 Pt from home via [facility] with reports of weakness and SHOB that has worsened over the last week. Pt lives alone but does have a caregiver that assists at home. With EMS pt noted to have HR of 160s.

## 2024-05-10 NOTE — H&P (Addendum)
 History and Physical    Patient: Alexandria Sherman FMW:[id number] DOB: 06/18/1946 DOA: 05/10/2024 DOS: the patient was seen and examined on 05/10/2024 PCP: Marylynn Verneita CROME, MD  Patient coming from: Home  Chief Complaint:  Chief Complaint  Patient presents with   Weakness   HPI: Alexandria Sherman is a 78 y.o. female with medical history significant of anxiety, depression, GERD, HTN, HLD, subclinical hyperthyroidism who was brought in to [facility] ED by ambulance for generalized weakness and shortness of breath that has worsened over the last week.  Patient lives alone but she does have a caregiver at home.  With EMS her heart rate was noted to have in 160s.  Patient tells me that she has a bilateral lower extremity swelling and chronic skin changes but does not carry the diagnosis of atrial fibrillation or congestive heart failure.  She also complains some dysuria but no fever no chills.  She denies any chest pain or palpitations.  She denies any diarrhea or vomiting.  Her main complaint was she was not able to walk around at her home.  In the ED, patient was found to be in atrial fibrillation with rapid ventricular response, new onset and also patient has positive urine analysis.  She also has a low TSH level which appears to be chronic in nature.  Patient was started on Cardizem  drip in the emergency room and was started on antibiotic.  Patient has a history of blisters with the use of cephalosporins in her vaginal area.   Hospitalist service was consulted for evaluation for admission for sepsis due to urinary tract infection and atrial fibrillation with rapid ventricular response.     Review of Systems: As mentioned in the history of present illness. All other systems reviewed and are negative. Past Medical History:  Diagnosis Date   Acute lateral meniscus tear of right knee    Anxiety disorder, unspecified    Cataract [facility]   Clotting disorder (HCC) blood circulation/veins in feet  and lower legs   Depression 1978   Diverticulosis of colon 04/2005   GERD (gastroesophageal reflux disease)    Glaucoma [facility]   Hypertension    Hyperthyroidism    Dr Damian   Impaired fasting glucose    Low TSH level    chronic low, but euthyroid   Microscopic hematuria    RBBB    S/p left hip fracture    Sleep disturbance    Subclinical hyperthyroidism    unspecified   Tibia fracture 05/2010   left   Past Surgical History:  Procedure Laterality Date   ABDOMINAL HYSTERECTOMY  1988   hysterectomy/ BSO for endometriosis   BREAST BIOPSY Right 1992   neg   BREAST EXCISIONAL BIOPSY Right 1988   benign per pt   INTRAMEDULLARY (IM) NAIL INTERTROCHANTERIC Left 01/15/2022   Procedure: INTRAMEDULLARY (IM) NAIL INTERTROCHANTRIC;  Surgeon: Cleotilde Barrio, MD;  Location: [facility] ORS;  Service: Orthopedics;  Laterality: Left;   KNEE ARTHROSCOPY  6/12   left--Dr Ernie   Social History:  reports that she has never smoked. She has been exposed to tobacco smoke. She has never used smokeless tobacco. She reports current alcohol use. No history on file for drug use.  Allergies  Allergen Reactions   Doxycycline  Other (See Comments)    Hallucinations, nocturnal:  In the night felt like she was flipping in the bed.   Augmentin [Amoxicillin-Pot Clavulanate] Other (See Comments)    Pt cannot remember   Cephalexin  REACTION: vaginal blisters Tolerates penicillins   Codeine Phosphate     REACTION: vomiting   Promethazine Hcl     REACTION: nerves    Family History  Problem Relation Age of Onset   Cancer Mother        lung   Diabetes Mother    Depression Mother    Depression Sister    Parkinson's disease Sister    Cancer Sister    Depression Sister    Stroke Other    Heart attack Other    Cancer Other    Dementia Other    Breast cancer Neg Hx    Thyroid  disease Neg Hx     Prior to Admission medications   Medication Sig Start Date End Date Taking? Authorizing  Provider  clorazepate  (TRANXENE ) 3.75 MG tablet TAKE 1 TABLET BY MOUTH 3 TIMES DAILY AS NEEDED FOR ANXIETY. 01/24/24   Jimmy Charlie FERNS, MD  diphenhydrAMINE  (BENADRYL ) 25 MG tablet Take 25 mg by mouth every 6 (six) hours as needed.    [provider]  divalproex  (DEPAKOTE ) 125 MG DR tablet TAKE 1 TABLET BY MOUTH TWICE DAILY 09/17/23   Jimmy Charlie I, MD  ibuprofen (ADVIL,MOTRIN) 200 MG tablet Take 200 mg by mouth every 6 (six) hours as needed.    [provider]  latanoprost  (XALATAN ) 0.005 % ophthalmic solution Place 1 drop into both eyes at bedtime. Patient taking differently: Place 1 drop into both eyes at bedtime. Does not take regularly - difficulty administering by herself 01/15/12   [provider]  meclizine  (ANTIVERT ) 25 MG tablet TAKE ONE TABLET DAILY AS NEEDED 12/12/22   Jimmy Charlie FERNS, MD  mometasone  (ELOCON ) 0.1 % cream Apply 1 Application topically 2 (two) times daily as needed (itching). 12/12/22   Jimmy Charlie FERNS, MD  omeprazole  (PRILOSEC ) 20 MG capsule TAKE 1 CAPSULE BY MOUTH ONCE DAILY Patient taking differently: Take 20 mg by mouth daily. Takes as needed 11/12/23   Jimmy Charlie FERNS, MD  polyethylene glycol (MIRALAX  / GLYCOLAX ) 17 g packet Take 17 g by mouth daily as needed for mild constipation. 01/18/22   Alexander, Natalie, DO  timolol  (TIMOPTIC ) 0.5 % ophthalmic solution Place 1 drop into both eyes daily. Patient taking differently: Place 1 drop into both eyes daily. Does not take regularly    [provider]  venlafaxine  (EFFEXOR ) 75 MG tablet TAKE ONE TABLET BY MOUTH TWICE DAILY 10/19/23   Jimmy Charlie FERNS, MD  Calcium  Carbonate (CALCIUM  500 PO) Take one by mouth twice a day   07/17/19  [provider]    Physical Exam: Vitals:   05/10/24 1230 05/10/24 1330 05/10/24 1340 05/10/24 1417  BP:  111/87    Pulse:      Resp: (!) 29 (!) 26 (!) 26 (!) 34  Temp:      SpO2:       Constitutional: Alert, awake, calm,  comfortable HEENT: Neck supple Respiratory: clear to auscultation bilaterally, no wheezing, no crackles. Normal respiratory effort. No accessory muscle use.  Cardiovascular: Irregularly irregular heart rate, rapid heartbeat 2+ pedal pulses. No carotid bruits.  Abdomen: no tenderness, no masses palpated. No hepatosplenomegaly. Bowel sounds positive.  Musculoskeletal: no clubbing / cyanosis. No joint deformity upper and lower extremities. Good ROM, no contractures. Normal muscle tone.  Skin: Chronic skin changes on bilateral lower extremities Neurologic: CN 2-12 grossly intact. Sensation intact, DTR normal. Strength 5/5 x all 4 extremities.  Psychiatric: Normal judgment and insight. Alert and oriented x 3. Normal  mood.   Data Reviewed:  Sodium 139 potassium 3, glucose 167, BUN 39 magnesium  2.3, albumin 2.8, white count 10.8, hemoglobin 14.8, hematocrit 46.8, TSH 0.27, free T41.33  Assessment and Plan: 78 year old female who has multiple medical problems including but not limited to chronic lymphedema with chronic skin changes, neuropathy, subclinical hyperthyroidism, depression, anxiety who came into the hospital for generalized weakness and dysuria.  1.  Sepsis due to urinary tract infection - She has allergy to cephalosporin which causes blisters in her vaginal area. - She will be admitted to the hospital as inpatient - She will be given aztreonam  for urinary tract infection - She will be admitted to stepdown unit due to sepsis and atrial fibrillation requiring Cardizem  drip - Follow-up cultures  2.  Atrial fibrillation with rapid ventricular response - She has been started on Cardizem  drip in the emergency room. - She will be continued on Cardizem  drip - Echocardiogram, cardiology evaluation, trend troponin - She has allergy to aspirin  -Heparin  drip per pharmacy dosing  3.  Chronic skin changes due to lymphedema on lower extremities - Supportive care - Elevate while  lying down in  the bed  4.  She has hypothyroidism - She has a history of subclinical hypothyroidism - Her TSH is low and free T4 is high - She will need to follow-up with endocrinology inpatient during the weekdays or outpatient - She will be given beta-blocker  5.  Anxiety/depression - Continue her home medications   6.  Hypokalemia - Replace potassium - Check urine potassium level     Advance Care Planning:   Code Status: Full Code Full  Consults: Cardiology  Family Communication: None available  Severity of Illness: The appropriate patient status for this patient is INPATIENT. Inpatient status is judged to be reasonable and necessary in order to provide the required intensity of service to ensure the patient's safety. The patient's presenting symptoms, physical exam findings, and initial radiographic and laboratory data in the context of their chronic comorbidities is felt to place them at high risk for further clinical deterioration. Furthermore, it is not anticipated that the patient will be medically stable for discharge from the hospital within 2 midnights of admission.   * I certify that at the point of admission it is my clinical judgment that the patient will require inpatient hospital care spanning beyond 2 midnights from the point of admission due to high intensity of service, high risk for further deterioration and high frequency of surveillance required.*  Author: Bryler Dibble, MD 05/10/2024 2:21 PM  For on call review [email or web address].

## 2024-05-10 NOTE — Progress Notes (Signed)
 ED Pharmacy Antibiotic Sign Off An antibiotic consult was received from an ED provider for aztreonam  per pharmacy dosing for UTI. A chart review was completed to assess appropriateness.   The following one time order(s) were placed: aztreonam  2 grams IV x 1  Further antibiotic and/or antibiotic pharmacy consults should be ordered by the admitting provider if indicated.   Thank you for allowing pharmacy to be a part of this patient's care.   Adriana JONETTA Bolster, [facility]  Clinical Pharmacist 05/10/24 1:54 PM

## 2024-05-10 NOTE — Progress Notes (Signed)
 CODE SEPSIS - PHARMACY COMMUNICATION  **Broad Spectrum Antibiotics should be administered within 1 hour of Sepsis diagnosis**  Time Code Sepsis Called/Page Received: 1354  Antibiotics Ordered: Aztreonam   Time of 1st antibiotic administration: 1520  Additional action taken by pharmacy: Messaged RN on transfer to ICU - sent up redispensed dose to get antibiotics started ASAP  If necessary, Name of Provider/Nurse Contacted: None    Damien Napoleon ,PharmD Clinical Pharmacist  05/10/2024  2:05 PM

## 2024-05-10 NOTE — Progress Notes (Signed)
 Elink following for sepsis protocol.

## 2024-05-10 NOTE — ED Provider Notes (Signed)
 [facility] Provider Note    Event Date/Time   First MD Initiated Contact with Patient 05/10/24 1004     (approximate)   History   Weakness   HPI  Alexandria Sherman is a 78 y.o. female history of lymphedema and neuropathy subclinical hypothyroidism  1 week of increasing fatigue feels like her body is just very weak all over like her legs just have no energy or strength.  Did report that she had a little bit of discomfort with urination at 1 point over the last week which has gone away.  She does suffer incontinence.  No chest pain but has been feeling a sense of shortness of breath or easily winded.   Chronic leg swelling reports her legs look normal for her     Physical Exam   Triage Vital Signs: ED Triage Vitals  Encounter Vitals Group     BP 05/10/24 0957 (!) 144/85     Girls Systolic BP Percentile --      Girls Diastolic BP Percentile --      Boys Systolic BP Percentile --      Boys Diastolic BP Percentile --      Pulse Rate 05/10/24 0957 (!) 142     Resp 05/10/24 0957 18     Temp 05/10/24 0957 98.4 F (36.9 C)     Temp src --      SpO2 05/10/24 0957 98 %     Weight --      Height --      Head Circumference --      Peak Flow --      Pain Score 05/10/24 0956 0     Pain Loc --      Pain Education --      Exclude from Growth Chart --     Most recent vital signs: Vitals:   05/10/24 1330 05/10/24 1340  BP: 111/87   Pulse:    Resp: (!) 26 (!) 26  Temp:    SpO2:       General: Awake, no distress.  Very pleasant.  Obviously tachycardic but in no distress amicable pleasant good friend of hers at the bedside CV:  Good peripheral perfusion.  Irregular rate tachycardia at times extreme Resp:  Normal effort.  Slight tachypnea.  Lung sounds clear bilateral.  No wheezing.  Patient denies feeling short of breath at this time Abd:  No distention.  Nontender nondistended Other:  Significant chronic venous stasis abnormalities lower extremity.   No frank warmth.  Capillary refill is normal and she has very purpuric appearance of skin in both lower feet.  They are equally edematous and no blisters or breakdown at this time.  No frank erythema though be highly suggestive of cellulitis and there is no drainage.  Patient reports that her feet are at her normal, they actually look good   ED Results / Procedures / Treatments   Labs (all labs ordered are listed, but only abnormal results are displayed) Labs Reviewed  COMPREHENSIVE METABOLIC PANEL WITH GFR - Abnormal; Notable for the following components:      Result Value   Potassium 3.0 (*)    Glucose, Bld 167 (*)    BUN 39 (*)    Albumin 2.8 (*)    All other components within normal limits  CBC - Abnormal; Notable for the following components:   WBC 10.8 (*)    HCT 46.8 (*)    All other components within normal limits  URINALYSIS, ROUTINE W REFLEX MICROSCOPIC - Abnormal; Notable for the following components:   Color, Urine YELLOW (*)    APPearance TURBID (*)    Hgb urine dipstick MODERATE (*)    Protein, ur 100 (*)    Leukocytes,Ua LARGE (*)    Bacteria, UA MANY (*)    All other components within normal limits  TSH - Abnormal; Notable for the following components:   TSH 0.027 (*)    All other components within normal limits  APTT - Abnormal; Notable for the following components:   aPTT 37 (*)    All other components within normal limits  BRAIN NATRIURETIC PEPTIDE - Abnormal; Notable for the following components:   B Natriuretic Peptide 277.6 (*)    All other components within normal limits  T4, FREE - Abnormal; Notable for the following components:   Free T4 1.33 (*)    All other components within normal limits  RESP PANEL BY RT-PCR (RSV, FLU A&B, COVID)  RVPGX2  CULTURE, BLOOD (ROUTINE X 2)  CULTURE, BLOOD (ROUTINE X 2)  URINE CULTURE  LACTIC ACID, PLASMA  PROTIME-INR  MAGNESIUM   TROPONIN I (HIGH SENSITIVITY)   Labs notable for hypokalemia, suppressed TSH and  hospitalist advises will follow T4  EKG  Interpreted by me at 10 AM heart rate 180 QRS 140 QTc 440 Slight artifact, right bundle branch block, underlying rhythm felt likely atrial flutter fibrillation.  Occasional PVC.  No frank ischemia   RADIOLOGY  CT Angio Chest PE W and/or Wo Contrast Result Date: 05/10/2024 CLINICAL DATA:  Worsening shortness of breath. EXAM: CT ANGIOGRAPHY CHEST WITH CONTRAST TECHNIQUE: Multidetector CT imaging of the chest was performed using the standard protocol during bolus administration of intravenous contrast. Multiplanar CT image reconstructions and MIPs were obtained to evaluate the vascular anatomy. RADIATION DOSE REDUCTION: This exam was performed according to the departmental dose-optimization program which includes automated exposure control, adjustment of the mA and/or kV according to patient size and/or use of iterative reconstruction technique. CONTRAST:  80mL OMNIPAQUE  IOHEXOL  350 MG/ML SOLN COMPARISON:  None Available. FINDINGS: Cardiovascular: The heart is enlarged. Coronary artery calcification is evident. Mild atherosclerotic calcification is noted in the wall of the thoracic aorta. Enlargement of the pulmonary outflow tract/main pulmonary arteries suggests pulmonary arterial hypertension. Mediastinum/Nodes: No mediastinal lymphadenopathy. Multinodular thyroid  gland including 1.5 cm left thyroid  nodule on image 12/5. There is no hilar lymphadenopathy. Small hiatal hernia. The esophagus has normal imaging features. There is no axillary lymphadenopathy. Lungs/Pleura: Centrilobular emphysema. 12 x 8 mm nodule posterior left upper lobe is probably atelectatic. Streaky areas of atelectasis or scarring are noted in both lung bases. No dense consolidative airspace disease. Trace right-sided pleural effusion. Upper Abdomen: 14 mm left adrenal nodule has attenuation too high to allow characterization as an adenoma. Musculoskeletal: No worrisome lytic or sclerotic osseous  abnormality. Review of the MIP images confirms the above findings. IMPRESSION: 1. No CT evidence for acute pulmonary embolus. 2. Enlargement of the pulmonary outflow tract/main pulmonary arteries suggests pulmonary arterial hypertension. 3. 12 x 8 mm nodule posterior left upper lobe is probably atelectatic. Follow-up CT chest in 3 months recommended to ensure resolution. 4. Trace right-sided pleural effusion. 5. 14 mm left adrenal nodule has attenuation too high to allow characterization as an adenoma. While likely a lipid poor adenoma or other benign finding, follow-up adrenal protocol CT abdomen without and with contrast or abdominal MRI recommended to further evaluate. 6. Multinodular thyroid  gland including 1.5 cm left thyroid  nodule. Recommend thyroid   US  (ref: J Am Coll Radiol. 2015 Feb;12(2): 143-50). 7. Aortic Atherosclerosis (ICD10-I70.0) and Emphysema (ICD10-J43.9). Electronically Signed   By: Camellia Candle M.D.   On: 05/10/2024 12:42      PROCEDURES:  Critical Care performed: Yes, see critical care procedure note(s)  Procedures   MEDICATIONS ORDERED IN ED: Medications  potassium chloride  10 mEq in 100 mL IVPB (10 mEq Intravenous New Bag/Given 05/10/24 1339)  diltiazem  (CARDIZEM ) 125 mg in dextrose  5% 125 mL (1 mg/mL) infusion (5 mg/hr Intravenous New Bag/Given 05/10/24 1318)  acetaminophen  (TYLENOL ) tablet 650 mg (has no administration in time range)    Or  acetaminophen  (TYLENOL ) suppository 650 mg (has no administration in time range)  polyethylene glycol (MIRALAX  / GLYCOLAX ) packet 17 g (has no administration in time range)  ondansetron  (ZOFRAN ) tablet 4 mg (has no administration in time range)    Or  ondansetron  (ZOFRAN ) injection 4 mg (has no administration in time range)  aspirin  EC tablet 81 mg (81 mg Oral Given 05/10/24 1335)  aztreonam  (AZACTAM ) 2 g in sodium chloride  0.9 % 100 mL IVPB (has no administration in time range)  diltiazem  (CARDIZEM ) injection 10 mg (10 mg  Intravenous Given 05/10/24 1057)  diltiazem  (CARDIZEM ) injection 10 mg (10 mg Intravenous Given 05/10/24 1158)  iohexol  (OMNIPAQUE ) 350 MG/ML injection 80 mL (80 mLs Intravenous Contrast Given 05/10/24 1204)   CRITICAL CARE Performed by: Oneil Budge   Total critical care time: 35 minutes  Critical care time was exclusive of separately billable procedures and treating other patients.  Critical care was necessary to treat or prevent imminent or life-threatening deterioration.  Critical care was time spent personally by me on the following activities: development of treatment plan with patient and/or surrogate as well as nursing, discussions with consultants, evaluation of patient's response to treatment, examination of patient, obtaining history from patient or surrogate, ordering and performing treatments and interventions, ordering and review of laboratory studies, ordering and review of radiographic studies, pulse oximetry and re-evaluation of patient's condition.   IMPRESSION / MDM / ASSESSMENT AND PLAN / ED COURSE  I reviewed the triage vital signs and the nursing notes.                              Differential diagnosis includes, but is not limited to, acute metabolic abnormality, infectious cause, thromboembolism, etc.  Patient has a history of subclinical hypothyroidism and her TSH is suppressed however in the past it appears her T4 has been normal.  She has evidence of urinary tract infection.  Will start aztreonam  she reports a rather severe blistering reaction to cephalosporin on clarification with her.  Discussed with our pharmacist and hospitalist.  Hospitalist Dr. Paudel accepting.  Patient been given a couple boluses of diltiazem  without significant improvement in rate.  Started on diltiazem  drip.  She is not anticoagulated, defer decision on anticoagulation to internal medicine/admitting team.  Patient's presentation is most consistent with acute presentation with potential  threat to life or bodily function.   The patient is on the cardiac monitor to evaluate for evidence of arrhythmia and/or significant heart rate changes.  ----------------------------------------- 1:57 PM on 05/10/2024 ----------------------------------------- Patient understand agreeable with plan for admission.  Has been seen by the hospitalist.  Code sepsis initiated at this time the patient does not have evidence of severe shock or hypotension, but would consider potential that UTI could be driving in part some of her symptoms  FINAL CLINICAL IMPRESSION(S) / ED DIAGNOSES   Final diagnoses:  Atrial fibrillation with rapid ventricular response (HCC)  Weakness  Hypokalemia  Generalized weakness  Urinary tract infection, acute  Sepsis, due to unspecified organism, unspecified whether acute organ dysfunction present [facility])  Abnormal CT scan     Rx / DC Orders   ED Discharge Orders          Ordered    Amb referral to [facility]        05/10/24 1312             Note:  This document was prepared using Dragon voice recognition software and may include unintentional dictation errors.   Dicky Anes, MD 05/10/24 1359

## 2024-05-10 NOTE — Progress Notes (Signed)
 PHARMACY - ANTICOAGULATION CONSULT NOTE  Pharmacy Consult for heparin  Indication: atrial fibrillation  Allergies  Allergen Reactions   Doxycycline  Other (See Comments)    Hallucinations, nocturnal:  In the night felt like she was flipping in the bed.   Augmentin [Amoxicillin-Pot Clavulanate] Other (See Comments)    Pt cannot remember   Cephalexin     REACTION: vaginal blisters Tolerates penicillins   Codeine Phosphate     REACTION: vomiting   Promethazine Hcl     REACTION: nerves    Patient Measurements: Height: 5' 4 (162.6 cm) Weight: 61.9 kg (136 lb 7.4 oz) IBW/kg (Calculated) : 54.7  Vital Signs: Temp: 98 F (36.7 C) (08/16 1438) Temp Source: Oral (08/16 1438) BP: 105/92 (08/16 1730) Pulse Rate: 60 (08/16 1630)  Labs: Recent Labs    05/10/24 1002 05/10/24 1108  HGB 14.8  --   HCT 46.8*  --   PLT 395  --   APTT  --  37*  LABPROT  --  14.8  INR  --  1.1  CREATININE 0.58  --   TROPONINIHS  --  16    Estimated Creatinine Clearance: 49.2 mL/min (by C-G formula based on SCr of 0.58 mg/dL).   Medical History: Past Medical History:  Diagnosis Date   Acute lateral meniscus tear of right knee    Anxiety disorder, unspecified    Cataract [facility]   Clotting disorder (HCC) blood circulation/veins in feet and lower legs   Depression 1978   Diverticulosis of colon 04/2005   GERD (gastroesophageal reflux disease)    Glaucoma [facility]   Hypertension    Hyperthyroidism    Dr Damian   Impaired fasting glucose    Low TSH level    chronic low, but euthyroid   Microscopic hematuria    RBBB    S/p left hip fracture    Sleep disturbance    Subclinical hyperthyroidism    unspecified   Tibia fracture 05/2010   left   Assessment: 78 y/o female presenting with shortness of breath, found to be in new onset atrial fibrillation with RVR. PMH significant for anxiety, depression, GERD, HTN, HLD. Pharmacy has been consulted to initiate heparin   infusion. Per chart review, patient was not on anticoagulation prior to admission.  Baseline labs: hgb 14.8, plt 395, INR 1.1  Goal of Therapy:  Heparin  level 0.3-0.7 units/ml Monitor platelets by anticoagulation protocol: Yes   Plan:  Give 3000 units bolus x 1 Start heparin  infusion at 800 units/hr Check anti-Xa level in 8 hours and daily while on heparin  Continue to monitor H&H and platelets  Thank you for involving pharmacy in this patient's care.   Damien Napoleon, PharmD Clinical Pharmacist 05/10/2024 5:55 PM

## 2024-05-11 ENCOUNTER — Inpatient Hospital Stay

## 2024-05-11 ENCOUNTER — Inpatient Hospital Stay (HOSPITAL_COMMUNITY): Admit: 2024-05-11 | Discharge: 2024-05-11 | Disposition: A | Discharge status: Home / Self Care | Attending: Hospitalist

## 2024-05-11 DIAGNOSIS — I4891 Unspecified atrial fibrillation: Secondary | ICD-10-CM

## 2024-05-11 DIAGNOSIS — I4819 Other persistent atrial fibrillation: Secondary | ICD-10-CM

## 2024-05-11 DIAGNOSIS — N390 Urinary tract infection, site not specified: Secondary | ICD-10-CM

## 2024-05-11 HISTORY — DX: Unspecified atrial fibrillation: I48.91

## 2024-05-11 LAB — CBC
HCT: 37.9 % (ref 36.0–46.0)
Hemoglobin: 12.4 g/dL (ref 12.0–15.0)
MCH: 30.9 pg (ref 26.0–34.0)
MCHC: 32.7 g/dL (ref 30.0–36.0)
MCV: 94.5 fL (ref 80.0–100.0)
Platelets: 347 K/uL (ref 150–400)
RBC: 4.01 MIL/uL (ref 3.87–5.11)
RDW: 14.6 % (ref 11.5–15.5)
WBC: 11.5 K/uL — ABNORMAL HIGH (ref 4.0–10.5)
nRBC: 0 % (ref 0.0–0.2)

## 2024-05-11 LAB — COMPREHENSIVE METABOLIC PANEL WITH GFR
ALT: 15 U/L (ref 0–44)
AST: 18 U/L (ref 15–41)
Albumin: 2.4 g/dL — ABNORMAL LOW (ref 3.5–5.0)
Alkaline Phosphatase: 68 U/L (ref 38–126)
Anion gap: 9 (ref 5–15)
BUN: 38 mg/dL — ABNORMAL HIGH (ref 8–23)
CO2: 27 mmol/L (ref 22–32)
Calcium: 8.9 mg/dL (ref 8.9–10.3)
Chloride: 105 mmol/L (ref 98–111)
Creatinine, Ser: 0.43 mg/dL — ABNORMAL LOW (ref 0.44–1.00)
GFR, Estimated: >60 mL/min (ref >60)
Glucose, Bld: 126 mg/dL — ABNORMAL HIGH (ref 70–99)
Potassium: 3.9 mmol/L (ref 3.5–5.1)
Sodium: 141 mmol/L (ref 135–145)
Total Bilirubin: 0.8 mg/dL (ref 0.0–1.2)
Total Protein: 6.3 g/dL — ABNORMAL LOW (ref 6.5–8.1)

## 2024-05-11 LAB — HEPARIN LEVEL (UNFRACTIONATED)
Heparin Unfractionated: 0.13 [IU]/mL — ABNORMAL LOW (ref 0.30–0.70)
Heparin Unfractionated: >1.1 [IU]/mL — ABNORMAL HIGH (ref 0.30–0.70)
Heparin Unfractionated: 0.33 [IU]/mL (ref 0.30–0.70)
Heparin Unfractionated: 0.54 [IU]/mL (ref 0.30–0.70)

## 2024-05-11 LAB — ECHOCARDIOGRAM COMPLETE
AR max vel: 1.51 cm2
AV Peak grad: 8.8 mmHg
Ao pk vel: 1.48 m/s
Height: 64 in
S' Lateral: 3.1 cm
Weight: 2183.44 [oz_av]

## 2024-05-11 LAB — MAGNESIUM: Magnesium: 2.1 mg/dL (ref 1.7–2.4)

## 2024-05-11 LAB — FOLATE: Folate: 11.2 ng/mL (ref >5.9)

## 2024-05-11 LAB — PHOSPHORUS: Phosphorus: 2.9 mg/dL (ref 2.5–4.6)

## 2024-05-11 LAB — VITAMIN B12: Vitamin B-12: 1105 pg/mL — ABNORMAL HIGH (ref 180–914)

## 2024-05-11 LAB — T4, FREE: Free T4: 1.26 ng/dL — ABNORMAL HIGH (ref 0.61–1.12)

## 2024-05-11 MED ORDER — TIMOLOL MALEATE 0.5 % OP SOLN
1.0000 [drp] | Freq: Every day | OPHTHALMIC | Status: DC
  Administered 2024-05-11 – 2024-05-21 (×11): 1 [drp] via OPHTHALMIC
  Filled 2024-05-11: qty 5

## 2024-05-11 MED ORDER — LATANOPROST 0.005 % OP SOLN
1.0000 [drp] | Freq: Every day | OPHTHALMIC | Status: DC
  Administered 2024-05-11 – 2024-05-20 (×10): 1 [drp] via OPHTHALMIC
  Filled 2024-05-11 (×2): qty 2.5

## 2024-05-11 MED ORDER — HEPARIN BOLUS VIA INFUSION
1650.0000 [IU] | Freq: Once | INTRAVENOUS | Status: AC
  Administered 2024-05-11: 1650 [IU] via INTRAVENOUS
  Filled 2024-05-11: qty 1650

## 2024-05-11 MED ORDER — CLORAZEPATE DIPOTASSIUM 7.5 MG PO TABS
3.7500 mg | ORAL_TABLET | Freq: Three times a day (TID) | ORAL | Status: DC | PRN
  Administered 2024-05-12 – 2024-05-19 (×5): 3.75 mg via ORAL
  Filled 2024-05-11 (×5): qty 1

## 2024-05-11 MED ORDER — METHIMAZOLE 5 MG PO TABS
15.0000 mg | ORAL_TABLET | Freq: Two times a day (BID) | ORAL | Status: DC
  Administered 2024-05-12: 15 mg via ORAL
  Filled 2024-05-11: qty 1

## 2024-05-11 MED ORDER — VENLAFAXINE HCL 37.5 MG PO TABS
75.0000 mg | ORAL_TABLET | Freq: Two times a day (BID) | ORAL | Status: DC
Start: 2024-05-11 — End: 2024-05-21
  Administered 2024-05-11 – 2024-05-21 (×21): 75 mg via ORAL
  Filled 2024-05-11 (×21): qty 2

## 2024-05-11 MED ORDER — METHIMAZOLE 5 MG PO TABS
15.0000 mg | ORAL_TABLET | Freq: Three times a day (TID) | ORAL | Status: AC
  Administered 2024-05-11 (×2): 15 mg via ORAL
  Filled 2024-05-11 (×2): qty 1

## 2024-05-11 MED ORDER — METHIMAZOLE 5 MG PO TABS: 15.0000 mg | ORAL_TABLET | Freq: Every day | ORAL | Status: DC

## 2024-05-11 MED ORDER — CLOTRIMAZOLE-BETAMETHASONE 1-0.05 % EX CREA
1.0000 | TOPICAL_CREAM | Freq: Two times a day (BID) | CUTANEOUS | Status: DC
  Administered 2024-05-11 – 2024-05-17 (×12): 1 via TOPICAL
  Filled 2024-05-11 (×3): qty 15

## 2024-05-11 MED ORDER — NYSTATIN 100000 UNIT/GM EX POWD: 1.0000 | Freq: Two times a day (BID) | CUTANEOUS | Status: DC | PRN

## 2024-05-11 MED ORDER — METOPROLOL TARTRATE 25 MG PO TABS
12.5000 mg | ORAL_TABLET | Freq: Four times a day (QID) | ORAL | Status: DC
  Administered 2024-05-11 – 2024-05-12 (×3): 12.5 mg via ORAL
  Filled 2024-05-11 (×3): qty 1

## 2024-05-11 NOTE — Progress Notes (Signed)
 PHARMACY - ANTICOAGULATION CONSULT NOTE  Pharmacy Consult for heparin  Indication: atrial fibrillation  Allergies  Allergen Reactions   Doxycycline  Other (See Comments)    Hallucinations, nocturnal:  In the night felt like she was flipping in the bed.   Augmentin [Amoxicillin-Pot Clavulanate] Other (See Comments)    Pt cannot remember   Cephalexin     REACTION: vaginal blisters Tolerates penicillins   Codeine Phosphate     REACTION: vomiting   Promethazine Hcl     REACTION: nerves    Patient Measurements: Height: 5' 4 (162.6 cm) Weight: 61.9 kg (136 lb 7.4 oz) IBW/kg (Calculated) : 54.7  Vital Signs: Temp: 97.9 F (36.6 C) (08/17 1245) Temp Source: Axillary (08/17 1245) BP: 124/74 (08/17 1525) Pulse Rate: 114 (08/17 1525)  Labs: Recent Labs    05/10/24 1002 05/10/24 1108 05/11/24 0225 05/11/24 1252 05/11/24 1511  HGB 14.8  --  12.4  --   --   HCT 46.8*  --  37.9  --   --   PLT 395  --  347  --   --   APTT  --  37*  --   --   --   LABPROT  --  14.8  --   --   --   INR  --  1.1  --   --   --   HEPARINUNFRC  --   --  0.13* >1.10* 0.33  CREATININE 0.58  --  0.43*  --   --   TROPONINIHS  --  16  --   --   --     Estimated Creatinine Clearance: 49.2 mL/min (A) (by C-G formula based on SCr of 0.43 mg/dL (L)).   Medical History: Past Medical History:  Diagnosis Date   Acute lateral meniscus tear of right knee    Anxiety disorder, unspecified    Cataract [facility]   Clotting disorder (HCC) blood circulation/veins in feet and lower legs   Depression 1978   Diverticulosis of colon 04/2005   GERD (gastroesophageal reflux disease)    Glaucoma [facility]   Hypertension    Hyperthyroidism    Dr Damian   Impaired fasting glucose    Low TSH level    chronic low, but euthyroid   Microscopic hematuria    RBBB    S/p left hip fracture    Sleep disturbance    Subclinical hyperthyroidism    unspecified   Tibia fracture 05/2010   left    Assessment: 78 y/o female presenting with shortness of breath, found to be in new onset atrial fibrillation with RVR. PMH significant for anxiety, depression, GERD, HTN, HLD. Pharmacy has been consulted to initiate heparin  infusion. Per chart review, patient was not on anticoagulation prior to admission.  Baseline labs: hgb 14.8, plt 395, INR 1.1  Date Time HL Rate/Comment 08/17 1252 >1.10 Heparin  level drawn incorrectly, ordered STAT repeat 08/17 1511 0.33 Therapeutic x1  Goal of Therapy:  Heparin  level 0.3-0.7 units/ml Monitor platelets by anticoagulation protocol: Yes   Plan:  Continue heparin  infusion at 1000 units/hr Check heparin  level in 8 hours to confirm Monitor heparin  levels and CBC daily while on heparin  infusion  Thank you for involving pharmacy in this patient's care.   Damien Napoleon Clinical Pharmacist 05/11/2024 3:49 PM

## 2024-05-11 NOTE — Plan of Care (Signed)
  Problem: Education: Goal: Knowledge of disease or condition will improve Outcome: Progressing   Problem: Clinical Measurements: Goal: Respiratory complications will improve Outcome: Progressing   Problem: Pain Managment: Goal: General experience of comfort will improve and/or be controlled Outcome: Progressing   Problem: Activity: Goal: Ability to tolerate increased activity will improve Outcome: Not Progressing   Problem: Cardiac: Goal: Ability to achieve and maintain adequate cardiopulmonary perfusion will improve Outcome: Not Progressing   Problem: Clinical Measurements: Goal: Will remain free from infection Outcome: Not Progressing

## 2024-05-11 NOTE — Progress Notes (Signed)
 PHARMACY - ANTICOAGULATION CONSULT NOTE  Pharmacy Consult for heparin  Indication: atrial fibrillation  Allergies  Allergen Reactions   Doxycycline  Other (See Comments)    Hallucinations, nocturnal:  In the night felt like she was flipping in the bed.   Augmentin [Amoxicillin-Pot Clavulanate] Other (See Comments)    Pt cannot remember   Cephalexin     REACTION: vaginal blisters Tolerates penicillins   Codeine Phosphate     REACTION: vomiting   Promethazine Hcl     REACTION: nerves    Patient Measurements: Height: 5' 4 (162.6 cm) Weight: 61.9 kg (136 lb 7.4 oz) IBW/kg (Calculated) : 54.7  Vital Signs: Temp: 97.9 F (36.6 C) (08/17 2000) Temp Source: Oral (08/17 2000) BP: 123/81 (08/17 2200) Pulse Rate: 64 (08/17 2200)  Labs: Recent Labs    05/10/24 1002 05/10/24 1108 05/11/24 0225 05/11/24 0225 05/11/24 1252 05/11/24 1511 05/11/24 2210  HGB 14.8  --  12.4  --   --   --   --   HCT 46.8*  --  37.9  --   --   --   --   PLT 395  --  347  --   --   --   --   APTT  --  37*  --   --   --   --   --   LABPROT  --  14.8  --   --   --   --   --   INR  --  1.1  --   --   --   --   --   HEPARINUNFRC  --   --  0.13*   < > >1.10* 0.33 0.54  CREATININE 0.58  --  0.43*  --   --   --   --   TROPONINIHS  --  16  --   --   --   --   --    < > = values in this interval not displayed.    Estimated Creatinine Clearance: 49.2 mL/min (A) (by C-G formula based on SCr of 0.43 mg/dL (L)).   Medical History: Past Medical History:  Diagnosis Date   Acute lateral meniscus tear of right knee    Anxiety disorder, unspecified    Cataract [facility]   Clotting disorder (HCC) blood circulation/veins in feet and lower legs   Depression 1978   Diverticulosis of colon 04/2005   GERD (gastroesophageal reflux disease)    Glaucoma [facility]   Hypertension    Hyperthyroidism    Dr Damian   Impaired fasting glucose    Low TSH level    chronic low, but euthyroid    Microscopic hematuria    RBBB    S/p left hip fracture    Sleep disturbance    Subclinical hyperthyroidism    unspecified   Tibia fracture 05/2010   left   Assessment: 78 y/o female presenting with shortness of breath, found to be in new onset atrial fibrillation with RVR. PMH significant for anxiety, depression, GERD, HTN, HLD. Pharmacy has been consulted to initiate heparin  infusion. Per chart review, patient was not on anticoagulation prior to admission.  Baseline labs: hgb 14.8, plt 395, INR 1.1  Date Time HL Rate/Comment 08/17 1252 >1.10 Heparin  level drawn incorrectly, ordered STAT repeat 08/17 1511 0.33 Therapeutic x1 08/17   2210   0.54     Therapeutic X 2   Goal of Therapy:  Heparin  level 0.3-0.7 units/ml Monitor platelets by anticoagulation protocol:  Yes   Plan:  Continue heparin  infusion at 1000 units/hr Check heparin  level on 8/18 with AM labs Monitor heparin  levels and CBC daily while on heparin  infusion  Thank you for involving pharmacy in this patient's care.   Shaunte Tuft D Clinical Pharmacist 05/11/2024 11:24 PM

## 2024-05-11 NOTE — Progress Notes (Signed)
 PHARMACY - ANTICOAGULATION CONSULT NOTE  Pharmacy Consult for heparin  Indication: atrial fibrillation  Allergies  Allergen Reactions   Doxycycline  Other (See Comments)    Hallucinations, nocturnal:  In the night felt like she was flipping in the bed.   Augmentin [Amoxicillin-Pot Clavulanate] Other (See Comments)    Pt cannot remember   Cephalexin     REACTION: vaginal blisters Tolerates penicillins   Codeine Phosphate     REACTION: vomiting   Promethazine Hcl     REACTION: nerves    Patient Measurements: Height: 5' 4 (162.6 cm) Weight: 61.9 kg (136 lb 7.4 oz) IBW/kg (Calculated) : 54.7  Vital Signs: Temp: 98.3 F (36.8 C) (08/16 2000) Temp Source: Oral (08/16 2000) BP: 99/69 (08/17 0000) Pulse Rate: 30 (08/17 0000)  Labs: Recent Labs    05/10/24 1002 05/10/24 1108 05/11/24 0225  HGB 14.8  --  12.4  HCT 46.8*  --  37.9  PLT 395  --  347  APTT  --  37*  --   LABPROT  --  14.8  --   INR  --  1.1  --   HEPARINUNFRC  --   --  0.13*  CREATININE 0.58  --   --   TROPONINIHS  --  16  --     Estimated Creatinine Clearance: 49.2 mL/min (by C-G formula based on SCr of 0.58 mg/dL).   Medical History: Past Medical History:  Diagnosis Date   Acute lateral meniscus tear of right knee    Anxiety disorder, unspecified    Cataract [facility]   Clotting disorder (HCC) blood circulation/veins in feet and lower legs   Depression 1978   Diverticulosis of colon 04/2005   GERD (gastroesophageal reflux disease)    Glaucoma [facility]   Hypertension    Hyperthyroidism    Dr Damian   Impaired fasting glucose    Low TSH level    chronic low, but euthyroid   Microscopic hematuria    RBBB    S/p left hip fracture    Sleep disturbance    Subclinical hyperthyroidism    unspecified   Tibia fracture 05/2010   left   Assessment: 78 y/o female presenting with shortness of breath, found to be in new onset atrial fibrillation with RVR. PMH significant for  anxiety, depression, GERD, HTN, HLD. Pharmacy has been consulted to initiate heparin  infusion. Per chart review, patient was not on anticoagulation prior to admission.  Baseline labs: hgb 14.8, plt 395, INR 1.1  Goal of Therapy:  Heparin  level 0.3-0.7 units/ml Monitor platelets by anticoagulation protocol: Yes   Plan:  8/17:  HL @ 0225 = 0.13, SUBtherapeutic X 1 - will order heparin  1650 units IV X 1 bolus and increase drip rate to 1000 units/hr - will recheck HL 8 hrs after rate change - CBC daily   Thank you for involving pharmacy in this patient's care.   Nivin Braniff D Clinical Pharmacist 05/11/2024 2:57 AM

## 2024-05-11 NOTE — Consult Note (Signed)
 Cardiology Consultation:   Patient ID: Alexandria Sherman; [id number]; [birth year]   Admit date: 05/10/2024 Date of Consult: 05/11/2024  Primary Care Provider: Marylynn Verneita CROME, MD Primary Cardiologist: Darron Primary Electrophysiologist:  None   Patient Profile:   Alexandria Sherman is a 78 y.o. female with a hx of coronary artery calcification noted on CT imaging, chronic sinus tachycardia, HTN, HLD, hypothyroidism, chronic venous insufficiency/lymphedema, anxiety, depression, and GERD who is being seen today for the evaluation of Afib at the request of Dr. Roann.  History of Present Illness:   Alexandria Sherman underwent vascular studies in 10/2018 that showed normal ABI and toe pressure bilaterally with triphasic waveforms.  Venous Doppler was negative for DVT.  Echo in 01/2019 showed normal LV systolic function with no significant valvular abnormalities.  Her lower extremity swelling has historically been managed with compression socks and lymphedema pump.   She was admitted to [facility] on 05/10/2024 with a 1-2 week history of progressive generalized weakness, dysuria and shortness of breath.  Never with chest pain or palpitations.  Chronic lower extremity swelling has been stable to improved.  No dizziness, presyncope, or syncope.  In the field, she was found to be tachycardic with a rate in the 160s bpm.  EMS run sheet notable for possible A-fib with RVR with review of twelve-lead showing tachycardic rate with significant artifact and PVCs/aberrancy.  She has remained hemodynamically stable with blood pressure ranging from the 1 teens to 140s systolic.  She was tachycardic with heart rates ranging from the 140s to 170s bpm, oxygen saturation 98% on room air, and afebrile.  Labs notable for elevated free T41.33, suppressed TSH at 0.027, negative high-sensitivity troponin, BNP 277, potassium 3.0 trending to 3.9, albumin 2.8 trending to 2.4, WBC 10.8 trending to 11.5.  CTA of the chest negative for PE with  enlargement of pulmonary outflow tract/main pulmonary artery suggestive of possible pulmonary hypertension, coronary artery calcification, left upper lobe pulmonary nodule, trace right sided pleural effusion, 14 mm left adrenal nodule, multinodular thyroid  gland with 1.5 cm left thyroid  nodule, emphysema, and aortic atherosclerosis.  In the ER she received aztreonam , Lopressor  12.5 mg twice daily, potassium repletion, and was placed on heparin  and diltiazem  drips.  At time of cardiology consult, she remains in Afib with ventricular rates in the 90s to low 100s bpm at rest, though will trend into the 120s to 150s bpm with movement in the bed.  No chest pain or palpitations.     Past Medical History:  Diagnosis Date   Acute lateral meniscus tear of right knee    Anxiety disorder, unspecified    Cataract [facility]   Clotting disorder (HCC) blood circulation/veins in feet and lower legs   Depression 1978   Diverticulosis of colon 04/2005   GERD (gastroesophageal reflux disease)    Glaucoma [facility]   Hypertension    Hyperthyroidism    Dr Damian   Impaired fasting glucose    Low TSH level    chronic low, but euthyroid   Microscopic hematuria    RBBB    S/p left hip fracture    Sleep disturbance    Subclinical hyperthyroidism    unspecified   Tibia fracture 05/2010   left    Past Surgical History:  Procedure Laterality Date   ABDOMINAL HYSTERECTOMY  1988   hysterectomy/ BSO for endometriosis   BREAST BIOPSY Right 1992   neg   BREAST EXCISIONAL BIOPSY Right 1988   benign per pt  INTRAMEDULLARY (IM) NAIL INTERTROCHANTERIC Left 01/15/2022   Procedure: INTRAMEDULLARY (IM) NAIL INTERTROCHANTRIC;  Surgeon: Cleotilde Barrio, MD;  Location: [facility] ORS;  Service: Orthopedics;  Laterality: Left;   KNEE ARTHROSCOPY  6/12   left--Dr Ernie     Home Meds: Prior to Admission medications   Medication Sig Start Date End Date Taking? Authorizing Provider  clorazepate  (TRANXENE )  3.75 MG tablet TAKE 1 TABLET BY MOUTH 3 TIMES DAILY AS NEEDED FOR ANXIETY. 01/24/24  Yes Jimmy Charlie FERNS, MD  diphenhydrAMINE  (BENADRYL ) 25 MG tablet Take 25 mg by mouth every 6 (six) hours as needed.   Yes [provider]  divalproex  (DEPAKOTE ) 125 MG DR tablet TAKE 1 TABLET BY MOUTH TWICE DAILY 09/17/23  Yes Letvak, Richard I, MD  ibuprofen (ADVIL,MOTRIN) 200 MG tablet Take 200 mg by mouth every 6 (six) hours as needed.   Yes [provider]  latanoprost  (XALATAN ) 0.005 % ophthalmic solution Place 1 drop into both eyes at bedtime. Patient taking differently: Place 1 drop into both eyes at bedtime. Does not take regularly - difficulty administering by herself 01/15/12  Yes [provider]  meclizine  (ANTIVERT ) 25 MG tablet TAKE ONE TABLET DAILY AS NEEDED 12/12/22  Yes Jimmy Charlie FERNS, MD  mometasone  (ELOCON ) 0.1 % cream Apply 1 Application topically 2 (two) times daily as needed (itching). 12/12/22  Yes Jimmy Charlie FERNS, MD  nystatin  (MYCOSTATIN /NYSTOP ) powder Apply 1 Application topically 3 (three) times daily. 04/04/24  Yes [provider]  omeprazole  (PRILOSEC ) 20 MG capsule TAKE 1 CAPSULE BY MOUTH ONCE DAILY Patient taking differently: Take 20 mg by mouth daily. Takes as needed 11/12/23  Yes Jimmy Charlie FERNS, MD  polyethylene glycol (MIRALAX  / GLYCOLAX ) 17 g packet Take 17 g by mouth daily as needed for mild constipation. 01/18/22  Yes Alexander, Natalie, DO  timolol  (TIMOPTIC ) 0.5 % ophthalmic solution Place 1 drop into both eyes daily. Patient taking differently: Place 1 drop into both eyes daily. Does not take regularly   Yes [provider]  venlafaxine  (EFFEXOR ) 75 MG tablet TAKE ONE TABLET BY MOUTH TWICE DAILY 10/19/23  Yes Letvak, Richard I, MD  Calcium  Carbonate (CALCIUM  500 PO) Take one by mouth twice a day   07/17/19  [provider]    Inpatient Medications: Scheduled Meds:  aspirin  EC  81 mg Oral Daily   Chlorhexidine  Gluconate  Cloth  6 each Topical QHS   divalproex   125 mg Oral BID   metoprolol  tartrate  12.5 mg Oral BID   nystatin   1 Application Topical TID   pantoprazole   40 mg Oral Daily   Continuous Infusions:  aztreonam  Stopped (05/11/24 9360)   diltiazem  (CARDIZEM ) infusion 10 mg/hr (05/11/24 0728)   heparin  1,000 Units/hr (05/11/24 0728)   PRN Meds: acetaminophen  **OR** acetaminophen , ondansetron  **OR** ondansetron  (ZOFRAN ) IV, polyethylene glycol  Allergies:   Allergies  Allergen Reactions   Doxycycline  Other (See Comments)    Hallucinations, nocturnal:  In the night felt like she was flipping in the bed.   Augmentin [Amoxicillin-Pot Clavulanate] Other (See Comments)    Pt cannot remember   Cephalexin     REACTION: vaginal blisters Tolerates penicillins   Codeine Phosphate     REACTION: vomiting   Promethazine Hcl     REACTION: nerves    Social History:   Social History   Socioeconomic History   Marital status: Divorced    Spouse name: Not on file   Number of children: 0   Years of education: 70  Highest education level: Bachelor's degree (e.g., BA, AB, BS)  Occupational History   Occupation: Teacher--retired  Tobacco Use   Smoking status: Never    Passive exposure: Past   Smokeless tobacco: Never  Vaping Use   Vaping status: Never Used  Substance and Sexual Activity   Alcohol use: Yes    Comment: Occasional   Drug use: Not on file   Sexual activity: Not Currently  Other Topics Concern   Not on file  Social History Narrative   Has living will   Both sisters have health care POA--- forms in chart   Now has DNR   No tube feeds if cognitively unaware   Social Drivers of Health   Financial Resource Strain: Low Risk  (03/31/2024)   Overall Financial Resource Strain (CARDIA)    Difficulty of Paying Living Expenses: Not hard at all  Food Insecurity: Food Insecurity Present (05/10/2024)   Hunger Vital Sign    Worried About Running Out of Food in the Last Year: Sometimes true     Ran Out of Food in the Last Year: Sometimes true  Transportation Needs: No Transportation Needs (05/10/2024)   PRAPARE - Administrator, Civil Service (Medical): No    Lack of Transportation (Non-Medical): No  Physical Activity: Inactive (03/31/2024)   Exercise Vital Sign    Days of Exercise per Week: 0 days    Minutes of Exercise per Session: 0 min  Stress: Stress Concern Present (03/31/2024)   [employer] of Occupational Health - Occupational Stress Questionnaire    Feeling of Stress: Rather much  Social Connections: Socially Isolated (05/10/2024)   Social Connection and Isolation Panel    Frequency of Communication with Friends and Family: More than three times a week    Frequency of Social Gatherings with Friends and Family: Once a week    Attends Religious Services: Never    Database administrator or Organizations: No    Attends Banker Meetings: Never    Marital Status: Divorced  Catering manager Violence: Not At Risk (05/10/2024)   Humiliation, Afraid, Rape, and Kick questionnaire    Fear of Current or Ex-Partner: No    Emotionally Abused: No    Physically Abused: No    Sexually Abused: No     Family History:   Family History  Problem Relation Age of Onset   Cancer Mother        lung   Diabetes Mother    Depression Mother    Depression Sister    Parkinson's disease Sister    Cancer Sister    Depression Sister    Stroke Other    Heart attack Other    Cancer Other    Dementia Other    Breast cancer Neg Hx    Thyroid  disease Neg Hx     ROS:  Review of Systems  Constitutional:  Positive for malaise/fatigue. Negative for chills, diaphoresis, fever and weight loss.  HENT:  Negative for congestion.   Eyes:  Negative for discharge and redness.  Respiratory:  Positive for shortness of breath. Negative for cough, hemoptysis, sputum production and wheezing.   Cardiovascular:  Positive for leg swelling. Negative for chest pain, palpitations,  orthopnea, claudication and PND.  Gastrointestinal:  Negative for abdominal pain, blood in stool, heartburn, melena, nausea and vomiting.  Genitourinary:  Positive for dysuria, frequency and urgency. Negative for hematuria.  Musculoskeletal:  Negative for falls and myalgias.  Skin:  Negative for rash.  Neurological:  Positive  for weakness. Negative for dizziness, tingling, tremors, sensory change, speech change, focal weakness and loss of consciousness.  Endo/Heme/Allergies:  Does not bruise/bleed easily.  Psychiatric/Behavioral:  Negative for substance abuse. The patient is not nervous/anxious.   All other systems reviewed and are negative.     Physical Exam/Data:   Vitals:   05/11/24 0600 05/11/24 0700 05/11/24 0717 05/11/24 0720  BP: 96/83 101/71    Pulse: 96   97  Resp: (!) 45   (!) 27  Temp:   97.7 F (36.5 C)   TempSrc:   Axillary   SpO2: 93%   97%  Weight:      Height:        Intake/Output Summary (Last 24 hours) at 05/11/2024 0817 Last data filed at 05/11/2024 9271 Gross per 24 hour  Intake 1058.97 ml  Output 450 ml  Net 608.97 ml   Filed Weights   05/10/24 1438  Weight: 61.9 kg   Body mass index is 23.42 kg/m.   Physical Exam: General: Well developed, well nourished, in no acute distress. Head: Normocephalic, atraumatic, sclera non-icteric, no xanthomas, nares without discharge.  Neck: Negative for carotid bruits. JVD not elevated. Lungs: Mildly diminished breath sounds bilaterally. Breathing is unlabored. Heart: Mildly tachycardic, IRIR with S1 S2. No murmurs, rubs, or gallops appreciated. Abdomen: Soft, non-tender, non-distended with normoactive bowel sounds. No hepatomegaly. No rebound/guarding. No obvious abdominal masses. Msk:  Strength and tone appear normal for age. Extremities: No clubbing or cyanosis. Chronic bilateral woody edema. Neuro: Alert and oriented X 3. No facial asymmetry. No focal deficit. Moves all extremities spontaneously. Psych:   Responds to questions appropriately with a normal affect.   EKG:  The EKG was personally reviewed and demonstrates: Afib with RVR, 183 bpm, RBBB, PVCs vs aberrancy, baseline artifact  Telemetry:  Telemetry was personally reviewed and demonstrates: Afib with ventricular rates ranging from the 90s to 140s bpm, currently in the low 100s bpm  Weights: Filed Weights   05/10/24 1438  Weight: 61.9 kg    Relevant CV Studies:  2D echo 02/13/2019: 1. The left ventricle has normal systolic function with an ejection  fraction of 60-65%. The cavity size was normal. There is moderately  increased left ventricular wall thickness. Left ventricular diastolic  Doppler parameters are consistent with impaired   relaxation.Near cavity obliteration in systole. No significant LVOT  gradient measured.   2. The right ventricle has normal systolic function. The cavity was  normal. There is no increase in right ventricular wall thickness.   3. Left atrial size was mildly dilated.   Laboratory Data:  Chemistry Recent Labs  Lab 05/10/24 1002 05/11/24 0225  NA 139 141  K 3.0* 3.9  CL 98 105  CO2 28 27  GLUCOSE 167* 126*  BUN 39* 38*  CREATININE 0.58 0.43*  CALCIUM  9.7 8.9  GFRNONAA >60 >60  ANIONGAP 13 9    Recent Labs  Lab 05/10/24 1002 05/11/24 0225  PROT 6.7 6.3*  ALBUMIN 2.8* 2.4*  AST 19 18  ALT 19 15  ALKPHOS 74 68  BILITOT 0.8 0.8   Hematology Recent Labs  Lab 05/10/24 1002 05/11/24 0225  WBC 10.8* 11.5*  RBC 4.90 4.01  HGB 14.8 12.4  HCT 46.8* 37.9  MCV 95.5 94.5  MCH 30.2 30.9  MCHC 31.6 32.7  RDW 14.4 14.6  PLT 395 347   Cardiac EnzymesNo results for input(s): TROPONINI in the last 168 hours. No results for input(s): TROPIPOC in the last 168 hours.  BNP Recent Labs  Lab 05/10/24 1108  BNP 277.6*    DDimer No results for input(s): DDIMER in the last 168 hours.  Radiology/Studies:  CT Angio Chest PE W and/or Wo Contrast Result Date:  05/10/2024 IMPRESSION: 1. No CT evidence for acute pulmonary embolus. 2. Enlargement of the pulmonary outflow tract/main pulmonary arteries suggests pulmonary arterial hypertension. 3. 12 x 8 mm nodule posterior left upper lobe is probably atelectatic. Follow-up CT chest in 3 months recommended to ensure resolution. 4. Trace right-sided pleural effusion. 5. 14 mm left adrenal nodule has attenuation too high to allow characterization as an adenoma. While likely a lipid poor adenoma or other benign finding, follow-up adrenal protocol CT abdomen without and with contrast or abdominal MRI recommended to further evaluate. 6. Multinodular thyroid  gland including 1.5 cm left thyroid  nodule. Recommend thyroid  US  (ref: J Am Coll Radiol. 2015 Feb;12(2): 143-50). 7. Aortic Atherosclerosis (ICD10-I70.0) and Emphysema (ICD10-J43.9). Electronically Signed   By: Camellia Candle M.D.   On: 05/10/2024 12:42    Assessment and Plan:   1. New onset A-fib with RVR:  - Possibly in the setting of sepsis with UTI and hyperthyroidism, as well as hypokalemia  - Uncertain chronicity  - Remains in Afib with RVR with ventricular rates largely in the low 100s bpm at rest, trending into the 120s to 150s bpm with movement in the bed - Hemodynamically stable - Titrate Lopressor  to 12.5 mg every 6 hours - Continue diltiazem  gtt - Heparin  drip with recommendation for DOAC prior to discharge  - Obtain echo  - CHA2DS2-VASc at least 5 (HTN, age x 2, vascular disease, sex category) - If following improvement in acute illness her ventricular rates remain difficult to control, she may require TEE-guided DCCV prior to discharge, otherwise would pursue DCCV in the outpatient setting after she has been adequately anticoagulated for 3-4 weeks without interruption (underlying hyperthyroidism may make it challenging to hold sinus rhythm)  2. Hyperthyroidism:  - Likely contributing to A-fib  - Management per primary service   3. Coronary artery  calcification:  - Noted on CT imaging  - High-sensitivity troponin negative x 1  - Obtain echo with further recommendations pending  - Check lipid panel and A1c for further risk stratification (LDL of 84 in 11/2023 with target LDL less than 70)   4. Abnormal CT findings:  - Further discussion of and follow-up findings deferred to primary service   5. Chronic venous insufficiency/lymphedema:  - Stable to improved from baseline - TED hose while admitted  - Lymphedema pumps at home   6. Hypokalemia:  - Repleted       For questions or updates, please contact [facility] Please consult [email or web address] for contact info under Cardiology/STEMI.   Signed, Bernardino Bring, PA-C [facility] Pager: [phone] 05/11/2024, 8:17 AM

## 2024-05-11 NOTE — Progress Notes (Signed)
 [facility] Progress Note  Patient: Alexandria Sherman    FMW:[id number]  DOA: 05/10/2024     Date of Service: the patient was seen and examined on 05/11/2024  Chief Complaint  Patient presents with   Weakness   Brief hospital course: MEILI KLECKLEY is a 78 y.o. female with medical history significant of anxiety, depression, GERD, HTN, HLD, subclinical hyperthyroidism who was brought in to [facility] ED by ambulance for generalized weakness and shortness of breath that has worsened over the last week.  Patient lives alone but she does have a caregiver at home.  With EMS her heart rate was noted to have in 160s.  Patient tells me that she has a bilateral lower extremity swelling and chronic skin changes but does not carry the diagnosis of atrial fibrillation or congestive heart failure.  She also complains some dysuria but no fever no chills.  She denies any chest pain or palpitations.  She denies any diarrhea or vomiting.  Her main complaint was she was not able to walk around at her home.   In the ED, patient was found to be in atrial fibrillation with rapid ventricular response, new onset and also patient has positive urine analysis.  She also has a low TSH level which appears to be chronic in nature.  Patient was started on Cardizem  drip in the emergency room and was started on antibiotic.  Patient has a history of blisters with the use of cephalosporins in her vaginal area.   Hospitalist service was consulted for evaluation for admission for sepsis due to urinary tract infection and atrial fibrillation with rapid ventricular response.   Assessment and Plan:  # Sepsis due to urinary tract infection Sepsis criteria, tachycardia, tachypneic and leukocytosis with UTI She is allergic to cephalosporin which causes blisters in her vaginal area. Continue aztreonam  for urinary tract infection - Follow-up urine culture, growing GNR Blood culture NGTD  # Atrial fibrillation w/RVR new onset,  POA CHADS2 DS 2 vasc score 4 (Age 33+sex 1+ HTN 1) Continue Cardizem  IV infusion Continue heparin  IV infusion, pharmacy dosing and PTT monitoring as per protocol Started Lopressor  12.5 mg p.o. q6 Continue to monitor on telemetry Follow TTE LVEF 50 to 55%, no WMA, no any significant findings Follow cardiology  # Hypethyroidism Undiagnosed previously, patient is poor historian, she is aware that her thyroid  functions were off, does not remember taking any medications TSH very low and free T4 slightly elevated Free T3 level is pending 8/17 started methimazole  15 mg p.o. 3 times daily x 2 days, twice daily x 3 days, daily afterwards Check thyroid  antibodies Follow-up thyroid  sonogram Trend free T4 daily  # Lymphedema of lower extremities, with skin changes possible fungal infection  Started Lotrisone  twice daily RN was advised to clean with soap water before each application Keep lower extremities elevated while in bed    # Anxiety/depression - Continue her home medications     # Hypokalemia - Replace potassium  Glaucoma, continued eyedrops   Body mass index is 23.42 kg/m.  Interventions:  Diet: Heart healthy/carb modified diet DVT Prophylaxis: Heparin  IV infusion  Advance goals of care discussion: Full code  Family Communication: family was not present at bedside, at the time of interview.  The pt provided permission to discuss medical plan with the family. Opportunity was given to ask question and all questions were answered satisfactorily.   Disposition:  Pt is from Home, admitted with sepsis due to UTI and A-fib with RVR, still has  A. fib, which precludes a safe discharge. Discharge to home, when stable, may need few days to improve.  Subjective: No significant events overnight, patient still having some shortness of breath, denied any chest pain or palpitation.   Physical Exam: General: NAD, lying comfortably, anxious Appear in mild distress, affect  appropriate Eyes: PERRLA ENT: Oral Mucosa Clear, moist  Neck: no JVD,  Cardiovascular: Irregular rhythm, no Murmur,  Respiratory: good respiratory effort, Bilateral Air entry equal and Decreased, no Crackles, no wheezes Abdomen: Bowel Sound present, Soft and no tenderness,  Skin: no rashes Extremities: b/l chronic lymphedema, erythema and scaling possible fungal infection, no calf tenderness Neurologic: without any new focal findings Gait not checked due to patient safety concerns  Vitals:   05/11/24 1008 05/11/24 1130 05/11/24 1200 05/11/24 1206  BP: 121/88   109/73  Pulse: (!) 109   73  Resp:  (!) 26    Temp:      TempSrc:      SpO2:   100%   Weight:      Height:        Intake/Output Summary (Last 24 hours) at 05/11/2024 1422 Last data filed at 05/11/2024 9271 Gross per 24 hour  Intake 1058.97 ml  Output 450 ml  Net 608.97 ml   Filed Weights   05/10/24 1438  Weight: 61.9 kg    Data Reviewed: I have personally reviewed and interpreted daily labs, tele strips, imagings as discussed above. I reviewed all nursing notes, pharmacy notes, vitals, pertinent old records I have discussed plan of care as described above with RN and patient/family.  CBC: Recent Labs  Lab 05/10/24 1002 05/11/24 0225  WBC 10.8* 11.5*  HGB 14.8 12.4  HCT 46.8* 37.9  MCV 95.5 94.5  PLT 395 347   Basic Metabolic Panel: Recent Labs  Lab 05/10/24 1002 05/11/24 0225  NA 139 141  K 3.0* 3.9  CL 98 105  CO2 28 27  GLUCOSE 167* 126*  BUN 39* 38*  CREATININE 0.58 0.43*  CALCIUM  9.7 8.9  MG 2.3 2.1  PHOS  --  2.9    Studies: ECHOCARDIOGRAM COMPLETE Result Date: 05/11/2024    ECHOCARDIOGRAM REPORT   Patient Name:   SAGE HAMMILL Date of Exam: 05/11/2024 Medical Rec #:  [id number]       Height:       64.0 in Accession #:    [id number]      Weight:       136.5 lb Date of Birth:  [birth year]       BSA:          1.663 m Patient Age:    4 years        BP:           96/83 mmHg Patient Gender: F                HR:           94 bpm. Exam Location:  [facility] Procedure: 2D Echo, Cardiac Doppler and Color Doppler (Both Spectral and Color            Flow Doppler were utilized during procedure). Indications:     Atrial Fibrillation I48.91  History:         Patient has prior history of Echocardiogram examinations.  Sonographer:     Bernice Rubinstein RDCS Referring Phys:  [id number] [facility] Diagnosing Phys: Redell Cave MD  Sonographer Comments: Image acquisition challenging due to respiratory motion. IMPRESSIONS  1. Left ventricular  ejection fraction, by estimation, is 50 to 55%. The left ventricle has low normal function. The left ventricle has no regional wall motion abnormalities. Left ventricular diastolic parameters are indeterminate.  2. Right ventricular systolic function is normal. The right ventricular size is normal.  3. The mitral valve is normal in structure. Mild mitral valve regurgitation.  4. The aortic valve is tricuspid. Aortic valve regurgitation is not visualized.  5. The inferior vena cava is normal in size with greater than 50% respiratory variability, suggesting right atrial pressure of 3 mmHg. FINDINGS  Left Ventricle: Left ventricular ejection fraction, by estimation, is 50 to 55%. The left ventricle has low normal function. The left ventricle has no regional wall motion abnormalities. The left ventricular internal cavity size was normal in size. There is no left ventricular hypertrophy. Left ventricular diastolic parameters are indeterminate. Right Ventricle: The right ventricular size is normal. No increase in right ventricular wall thickness. Right ventricular systolic function is normal. Left Atrium: Left atrial size was normal in size. Right Atrium: Right atrial size was normal in size. Pericardium: There is no evidence of pericardial effusion. Mitral Valve: The mitral valve is normal in structure. Mild mitral valve regurgitation. Tricuspid Valve: The tricuspid valve is normal in  structure. Tricuspid valve regurgitation is mild. Aortic Valve: The aortic valve is tricuspid. Aortic valve regurgitation is not visualized. Aortic valve peak gradient measures 8.8 mmHg. Pulmonic Valve: The pulmonic valve was normal in structure. Pulmonic valve regurgitation is not visualized. Aorta: The aortic root and ascending aorta are structurally normal, with no evidence of dilitation. Venous: The inferior vena cava is normal in size with greater than 50% respiratory variability, suggesting right atrial pressure of 3 mmHg. IAS/Shunts: No atrial level shunt detected by color flow Doppler.  LEFT VENTRICLE PLAX 2D LVIDd:         4.00 cm   Diastology LVIDs:         3.10 cm   LV e' medial:  5.11 cm/s LV PW:         1.10 cm   LV e' lateral: 15.20 cm/s LV IVS:        1.00 cm LVOT diam:     2.00 cm LV SV:         23 LV SV Index:   14 LVOT Area:     3.14 cm  RIGHT VENTRICLE RV Basal diam:  3.00 cm TAPSE (M-mode): 1.8 cm LEFT ATRIUM             Index        RIGHT ATRIUM           Index LA diam:        3.90 cm 2.35 cm/m   RA Area:     14.00 cm LA Vol (A2C):   61.2 ml 36.80 ml/m  RA Volume:   33.90 ml  20.38 ml/m LA Vol (A4C):   52.4 ml 31.51 ml/m LA Biplane Vol: 58.7 ml 35.30 ml/m  AORTIC VALVE                 PULMONIC VALVE AV Area (Vmax): 1.51 cm     PV Vmax:        0.79 m/s AV Vmax:        148.00 cm/s  PV Peak grad:   2.5 mmHg AV Peak Grad:   8.8 mmHg     RVOT Peak grad: 1 mmHg LVOT Vmax:      71.00 cm/s LVOT Vmean:  44.900 cm/s LVOT VTI:       0.072 m  AORTA Ao Root diam: 3.30 cm Ao Asc diam:  3.00 cm TRICUSPID VALVE TR Peak grad:   33.2 mmHg TR Vmax:        288.00 cm/s  SHUNTS Systemic VTI:  0.07 m Systemic Diam: 2.00 cm Redell Cave MD Electronically signed by Redell Cave MD Signature Date/Time: 05/11/2024/10:51:51 AM    Final     Scheduled Meds:  aspirin  EC  81 mg Oral Daily   Chlorhexidine  Gluconate Cloth  6 each Topical QHS   clotrimazole -betamethasone   1 Application Topical BID    divalproex   125 mg Oral BID   latanoprost   1 drop Both Eyes QHS   methIMAzole   15 mg Oral TID   Followed by   NOREEN ON 05/12/2024] methIMAzole   15 mg Oral BID   Followed by   NOREEN ON 05/14/2024] methIMAzole   15 mg Oral Daily   metoprolol  tartrate  12.5 mg Oral Q6H   nystatin   1 Application Topical TID   pantoprazole   40 mg Oral Daily   timolol   1 drop Both Eyes Daily   venlafaxine   75 mg Oral BID   Continuous Infusions:  aztreonam  1 g (05/11/24 1420)   diltiazem  (CARDIZEM ) infusion 10 mg/hr (05/11/24 0728)   heparin  1,000 Units/hr (05/11/24 0728)   PRN Meds: acetaminophen  **OR** acetaminophen , ondansetron  **OR** ondansetron  (ZOFRAN ) IV, polyethylene glycol  Time spent: 55 minutes  Author: ELVAN SOR. MD [facility] 05/11/2024 2:22 PM  To reach On-call, see care teams to locate the attending and reach out to them via [email or web address]. If 7PM-7AM, please contact night-coverage If you still have difficulty reaching the attending provider, please page the [facility] (Director on Call) for [facility] on amion for assistance.

## 2024-05-12 ENCOUNTER — Inpatient Hospital Stay

## 2024-05-12 DIAGNOSIS — J9601 Acute respiratory failure with hypoxia: Secondary | ICD-10-CM | POA: Diagnosis not present

## 2024-05-12 DIAGNOSIS — I739 Peripheral vascular disease, unspecified: Secondary | ICD-10-CM

## 2024-05-12 DIAGNOSIS — I4819 Other persistent atrial fibrillation: Secondary | ICD-10-CM | POA: Diagnosis not present

## 2024-05-12 DIAGNOSIS — I4891 Unspecified atrial fibrillation: Secondary | ICD-10-CM | POA: Diagnosis not present

## 2024-05-12 LAB — URINE CULTURE: Culture: >=100000 — AB

## 2024-05-12 LAB — CBC
HCT: 41.1 % (ref 36.0–46.0)
Hemoglobin: 12.7 g/dL (ref 12.0–15.0)
MCH: 30.4 pg (ref 26.0–34.0)
MCHC: 30.9 g/dL (ref 30.0–36.0)
MCV: 98.3 fL (ref 80.0–100.0)
Platelets: 329 K/uL (ref 150–400)
RBC: 4.18 MIL/uL (ref 3.87–5.11)
RDW: 14.7 % (ref 11.5–15.5)
WBC: 13.3 K/uL — ABNORMAL HIGH (ref 4.0–10.5)
nRBC: 0 % (ref 0.0–0.2)

## 2024-05-12 LAB — HEPARIN LEVEL (UNFRACTIONATED): Heparin Unfractionated: 0.61 [IU]/mL (ref 0.30–0.70)

## 2024-05-12 LAB — LIPID PANEL
Cholesterol: 112 mg/dL (ref 0–200)
HDL: 31 mg/dL — ABNORMAL LOW (ref >40)
LDL Cholesterol: 65 mg/dL (ref 0–99)
Total CHOL/HDL Ratio: 3.6 ratio
Triglycerides: 78 mg/dL (ref <150)
VLDL: 16 mg/dL (ref 0–40)

## 2024-05-12 LAB — BASIC METABOLIC PANEL WITH GFR
Anion gap: 8 (ref 5–15)
BUN: 31 mg/dL — ABNORMAL HIGH (ref 8–23)
CO2: 25 mmol/L (ref 22–32)
Calcium: 8.9 mg/dL (ref 8.9–10.3)
Chloride: 107 mmol/L (ref 98–111)
Creatinine, Ser: 0.49 mg/dL (ref 0.44–1.00)
GFR, Estimated: >60 mL/min (ref >60)
Glucose, Bld: 121 mg/dL — ABNORMAL HIGH (ref 70–99)
Potassium: 4.3 mmol/L (ref 3.5–5.1)
Sodium: 140 mmol/L (ref 135–145)

## 2024-05-12 LAB — T4, FREE: Free T4: 1.02 ng/dL (ref 0.61–1.12)

## 2024-05-12 LAB — PHOSPHORUS: Phosphorus: 4.2 mg/dL (ref 2.5–4.6)

## 2024-05-12 LAB — MAGNESIUM: Magnesium: 2.1 mg/dL (ref 1.7–2.4)

## 2024-05-12 MED ORDER — METOPROLOL TARTRATE 25 MG PO TABS
25.0000 mg | ORAL_TABLET | Freq: Two times a day (BID) | ORAL | Status: DC
  Administered 2024-05-12 (×2): 25 mg via ORAL
  Filled 2024-05-12 (×2): qty 1

## 2024-05-12 MED ORDER — METOPROLOL TARTRATE 5 MG/5ML IV SOLN
5.0000 mg | Freq: Four times a day (QID) | INTRAVENOUS | Status: DC | PRN
  Administered 2024-05-12 – 2024-05-13 (×2): 5 mg via INTRAVENOUS
  Filled 2024-05-12 (×2): qty 5

## 2024-05-12 MED ORDER — METHIMAZOLE 5 MG PO TABS
15.0000 mg | ORAL_TABLET | Freq: Every day | ORAL | Status: DC
  Administered 2024-05-13 – 2024-05-16 (×4): 15 mg via ORAL
  Filled 2024-05-12 (×4): qty 1

## 2024-05-12 MED ORDER — SULFAMETHOXAZOLE-TRIMETHOPRIM 800-160 MG PO TABS
1.0000 | ORAL_TABLET | Freq: Two times a day (BID) | ORAL | Status: AC
  Administered 2024-05-12 – 2024-05-14 (×6): 1 via ORAL
  Filled 2024-05-12 (×7): qty 1

## 2024-05-12 MED ORDER — APIXABAN 5 MG PO TABS
5.0000 mg | ORAL_TABLET | Freq: Two times a day (BID) | ORAL | Status: DC
  Administered 2024-05-12 – 2024-05-21 (×18): 5 mg via ORAL
  Filled 2024-05-12 (×18): qty 1

## 2024-05-12 MED ORDER — DIGOXIN 0.25 MG/ML IJ SOLN
0.2500 mg | Freq: Once | INTRAMUSCULAR | Status: AC
  Administered 2024-05-12: 0.25 mg via INTRAVENOUS
  Filled 2024-05-12: qty 2

## 2024-05-12 MED ORDER — SODIUM CHLORIDE 0.9 % IV BOLUS
250.0000 mL | Freq: Once | INTRAVENOUS | Status: AC
  Administered 2024-05-12: 250 mL via INTRAVENOUS

## 2024-05-12 MED ORDER — METHIMAZOLE 5 MG PO TABS: 15.0000 mg | ORAL_TABLET | Freq: Every day | ORAL | Status: DC

## 2024-05-12 MED ORDER — METHIMAZOLE 5 MG PO TABS
15.0000 mg | ORAL_TABLET | Freq: Two times a day (BID) | ORAL | Status: AC
  Administered 2024-05-12: 15 mg via ORAL
  Filled 2024-05-12: qty 1

## 2024-05-12 MED ORDER — LIVING WELL WITH DIABETES BOOK
Freq: Once | Status: DC
  Filled 2024-05-12: qty 1

## 2024-05-12 MED ORDER — FUROSEMIDE 10 MG/ML IJ SOLN
40.0000 mg | Freq: Once | INTRAMUSCULAR | Status: AC
  Administered 2024-05-12: 40 mg via INTRAVENOUS
  Filled 2024-05-12: qty 4

## 2024-05-12 NOTE — Progress Notes (Signed)
   05/12/24 1819  Assess: MEWS Score  Temp 98.4 F (36.9 C)  BP (!) 120/106  MAP (mmHg) 113  Pulse Rate (!) 126  ECG Heart Rate (!) 138  Resp (!) 25  Level of Consciousness Alert  SpO2 92 %  O2 Device Nasal Cannula  O2 Flow Rate (L/min) 1 L/min  Assess: MEWS Score  MEWS Temp 0  MEWS Systolic 0  MEWS Pulse 3  MEWS RR 1  MEWS LOC 0  MEWS Score 4  MEWS Score Color Red  Assess: if the MEWS score is Yellow or Red  Were vital signs accurate and taken at a resting state? Yes  Does the patient meet 2 or more of the SIRS criteria? Yes  Does the patient have a confirmed or suspected source of infection? Yes  MEWS guidelines implemented  Yes, red  Treat  MEWS Interventions Considered administering scheduled or prn medications/treatments as ordered  Take Vital Signs  Increase Vital Sign Frequency  Red: Q1hr x2, continue Q4hrs until patient remains green for 12hrs  Escalate  MEWS: Escalate Red: Discuss with charge nurse and notify provider. Consider notifying RRT. If remains red for 2 hours consider need for higher level of care  Provider Notification  Provider Name/Title Dr. Von  Date Provider Notified 05/12/24  Time Provider Notified [phone]  Notification Reason Other (Comment) (red MEWS)  Date of Provider Response 05/12/24  Time of Provider Response 1838  Assess: SIRS CRITERIA  SIRS Temperature  0  SIRS Respirations  1  SIRS Pulse 1  SIRS WBC 0  SIRS Score Sum  2   Notified Dr. Von that patient is now a red MEWS because of heart rate 130-140 and RR mid to upper 20s. MD acknowledged. RN will give IV metoprolol  per PRN order per Dr. Darron.

## 2024-05-12 NOTE — Progress Notes (Addendum)
 This RN attempted to call the patient's sister and primary emergency contact without success to notify them of the transfer. 2A nurse has been notified of the attempts and pt has been notified. The patient has been transferred to room 236.

## 2024-05-12 NOTE — Progress Notes (Signed)
 [facility] Progress Note  Patient: Alexandria Sherman    FMW:[id number]  DOA: 05/10/2024     Date of Service: the patient was seen and examined on 05/12/2024  Chief Complaint  Patient presents with   Weakness   Brief hospital course: Alexandria Sherman is a 78 y.o. female with medical history significant of anxiety, depression, GERD, HTN, HLD, subclinical hyperthyroidism who was brought in to [facility] ED by ambulance for generalized weakness and shortness of breath that has worsened over the last week.  Patient lives alone but she does have a caregiver at home.  With EMS her heart rate was noted to have in 160s.  Patient tells me that she has a bilateral lower extremity swelling and chronic skin changes but does not carry the diagnosis of atrial fibrillation or congestive heart failure.  She also complains some dysuria but no fever no chills.  She denies any chest pain or palpitations.  She denies any diarrhea or vomiting.  Her main complaint was she was not able to walk around at her home.   In the ED, patient was found to be in atrial fibrillation with rapid ventricular response, new onset and also patient has positive urine analysis.  She also has a low TSH level which appears to be chronic in nature.  Patient was started on Cardizem  drip in the emergency room and was started on antibiotic.  Patient has a history of blisters with the use of cephalosporins in her vaginal area.   Hospitalist service was consulted for evaluation for admission for sepsis due to urinary tract infection and atrial fibrillation with rapid ventricular response.   Assessment and Plan:  # Sepsis due to urinary tract infection Sepsis criteria, tachycardia, tachypneic and leukocytosis with UTI She is allergic to cephalosporin which causes blisters in her vaginal area. S/p Aztreonam , urine culture grew E. coli.  As per sensitivity patient was transition to Bactrimx 3 days Blood culture NGTD  # Atrial fibrillation w/RVR new  onset, POA CHADS2 DS 2 vasc score 4 (Age 65+sex 1+ HTN 1) Continue Cardizem  IV infusion Continue heparin  IV infusion, pharmacy dosing and PTT monitoring as per protocol 8/18 changed Lopressor  25 mg p.o. q12 Continue to monitor on telemetry Follow TTE LVEF 50 to 55%, no WMA, no any significant findings Follow cardiology  # Hypethyroidism Undiagnosed previously, patient is poor historian, she is aware that her thyroid  functions were off, does not remember taking any medications TSH very low and free T4 slightly elevated Free T3 level is pending 8/17 started methimazole  15 mg p.o. TID x 2 doses, and BID x 2 doses, followed by daily  8/18 Free T4 level 1.02 gradually decreasing Check thyroid  antibodies Follow-up thyroid  sonogram Trend free T4 daily  # Lymphedema of lower extremities, with skin changes possible fungal infection  Started Lotrisone  twice daily RN was advised to clean with soap water before each application Keep lower extremities elevated while in bed    # Anxiety/depression - Continue her home medications     # Hypokalemia - Replace potassium  Glaucoma, continued eyedrops   Body mass index is 23.42 kg/m.  Interventions:  Diet: Heart healthy/carb modified diet DVT Prophylaxis: Heparin  IV infusion  Advance goals of care discussion: Full code  Family Communication: family was not present at bedside, at the time of interview.  The pt provided permission to discuss medical plan with the family. Opportunity was given to ask question and all questions were answered satisfactorily.   Disposition:  Pt is  from Home, admitted with sepsis due to UTI and A-fib with RVR, still has A. fib, which precludes a safe discharge. Discharge to home, when stable, may need few days to improve.  Subjective: No significant events overnight, still has mild shortness of breath, overall improving, no chest pain or palpitations.  Denies any other complaints.    Physical  Exam: General: NAD, lying comfortably, anxious Appear in mild distress, affect appropriate Eyes: PERRLA ENT: Oral Mucosa Clear, moist  Neck: no JVD,  Cardiovascular: Irregular rhythm, no Murmur,  Respiratory: good respiratory effort, Bilateral Air entry equal and Decreased, no Crackles, no wheezes Abdomen: Bowel Sound present, Soft and no tenderness,  Skin: no rashes Extremities: b/l chronic lymphedema, erythema and scaling possible fungal infection, no calf tenderness Neurologic: without any new focal findings Gait not checked due to patient safety concerns  Vitals:   05/12/24 1500 05/12/24 1530 05/12/24 1600 05/12/24 1603  BP: 98/73   (!) 126/90  Pulse: 67 71 68 70  Resp: (!) 30 20 (!) 31 (!) 24  Temp:   98 F (36.7 C)   TempSrc:   Axillary   SpO2: 98% 90% 93% 93%  Weight:      Height:        Intake/Output Summary (Last 24 hours) at 05/12/2024 1703 Last data filed at 05/12/2024 1601 Gross per 24 hour  Intake 1100.38 ml  Output 250 ml  Net 850.38 ml   Filed Weights   05/10/24 1438  Weight: 61.9 kg    Data Reviewed: I have personally reviewed and interpreted daily labs, tele strips, imagings as discussed above. I reviewed all nursing notes, pharmacy notes, vitals, pertinent old records I have discussed plan of care as described above with RN and patient/family.  CBC: Recent Labs  Lab 05/10/24 1002 05/11/24 0225 05/12/24 0310  WBC 10.8* 11.5* 13.3*  HGB 14.8 12.4 12.7  HCT 46.8* 37.9 41.1  MCV 95.5 94.5 98.3  PLT 395 347 329   Basic Metabolic Panel: Recent Labs  Lab 05/10/24 1002 05/11/24 0225 05/12/24 0310  NA 139 141 140  K 3.0* 3.9 4.3  CL 98 105 107  CO2 28 27 25   GLUCOSE 167* 126* 121*  BUN 39* 38* 31*  CREATININE 0.58 0.43* 0.49  CALCIUM  9.7 8.9 8.9  MG 2.3 2.1 2.1  PHOS  --  2.9 4.2    Studies: DG Chest Port 1 View Result Date: 05/12/2024 CLINICAL DATA:  Shortness of breath. EXAM: PORTABLE CHEST 1 VIEW COMPARISON:  Chest CT 05/10/2024  FINDINGS: Again seen elevation of right hemidiaphragm. Stable heart size and mediastinal contours. Development of small bilateral pleural effusions with bibasilar opacities. Additional areas of bandlike atelectasis in the left mid lung. No pulmonary edema. No pneumothorax. IMPRESSION: 1. Development of small bilateral pleural effusions with bibasilar opacities. 2. Additional areas of bandlike atelectasis in the left mid lung. Electronically Signed   By: Andrea Gasman M.D.   On: 05/12/2024 12:50    Scheduled Meds:  apixaban   5 mg Oral BID   aspirin  EC  81 mg Oral Daily   Chlorhexidine  Gluconate Cloth  6 each Topical QHS   clotrimazole -betamethasone   1 Application Topical BID   divalproex   125 mg Oral BID   latanoprost   1 drop Both Eyes QHS   methIMAzole   15 mg Oral BID   Followed by   NOREEN ON 05/13/2024] methIMAzole   15 mg Oral Daily   metoprolol  tartrate  25 mg Oral BID   pantoprazole   40 mg Oral  Daily   sulfamethoxazole -trimethoprim   1 tablet Oral Q12H   timolol   1 drop Both Eyes Daily   venlafaxine   75 mg Oral BID   Continuous Infusions:  diltiazem  (CARDIZEM ) infusion Stopped (05/12/24 0627)   heparin  1,000 Units/hr (05/12/24 1605)   PRN Meds: acetaminophen  **OR** acetaminophen , clorazepate , nystatin , ondansetron  **OR** ondansetron  (ZOFRAN ) IV, polyethylene glycol  Time spent: 55 minutes  Author: ELVAN SOR. MD [facility] 05/12/2024 5:03 PM  To reach On-call, see care teams to locate the attending and reach out to them via [email or web address]. If 7PM-7AM, please contact night-coverage If you still have difficulty reaching the attending provider, please page the [facility] (Director on Call) for [facility] on amion for assistance.

## 2024-05-12 NOTE — Progress Notes (Signed)
   05/12/24 2026  Assess: MEWS Score  BP 93/74  MAP (mmHg) 81  Pulse Rate (!) 37  ECG Heart Rate (!) 120  Resp (!) 27  SpO2 98 %  Assess: MEWS Score  MEWS Temp 0  MEWS Systolic 1  MEWS Pulse 2  MEWS RR 2  MEWS LOC 0  MEWS Score 5  MEWS Score Color Red  Assess: if the MEWS score is Yellow or Red  Were vital signs accurate and taken at a resting state? Yes  Does the patient have a confirmed or suspected source of infection? Yes  MEWS guidelines implemented  No, previously red, continue vital signs every 4 hours  Notify: Charge Nurse/RN  Name of Charge Nurse/RN Notified Yasmin RN  Provider Notification  Provider Name/Title Dr. Lawence  Date Provider Notified 05/12/24  Time Provider Notified 1949  Method of Notification Page  Notification Reason Other (Comment) (Red MEWS, HR respirations)  Provider response See new orders  Date of Provider Response 05/12/24  Time of Provider Response 1910  Assess: SIRS CRITERIA  SIRS Temperature  0  SIRS Respirations  1  SIRS Pulse 1  SIRS WBC 0  SIRS Score Sum  2

## 2024-05-12 NOTE — Progress Notes (Signed)
 RN notified Dr. Darron and Dr. Von that patient's HR is 120-140. Dr. Arida gave order for metoprolol  5 mg IV PRN q6H for HR > 110.

## 2024-05-12 NOTE — Progress Notes (Incomplete)
 Patient continues to remain in a red MEWS. Remains lethargic with  difficulty swallowing. Medications crushed for patients safety. HOF 40 degrees. Dr. Lawence notified.

## 2024-05-12 NOTE — Progress Notes (Signed)
 PHARMACY - ANTICOAGULATION CONSULT NOTE  Pharmacy Consult for heparin  Indication: atrial fibrillation  Allergies  Allergen Reactions   Doxycycline  Other (See Comments)    Hallucinations, nocturnal:  In the night felt like she was flipping in the bed.   Augmentin [Amoxicillin-Pot Clavulanate] Other (See Comments)    Pt cannot remember   Cephalexin     REACTION: vaginal blisters Tolerates penicillins   Codeine Phosphate     REACTION: vomiting   Promethazine Hcl     REACTION: nerves    Patient Measurements: Height: 5' 4 (162.6 cm) Weight: 61.9 kg (136 lb 7.4 oz) IBW/kg (Calculated) : 54.7  Vital Signs: Temp: 97.9 F (36.6 C) (08/17 2000) Temp Source: Oral (08/17 2000) BP: 110/91 (08/18 0200) Pulse Rate: 68 (08/18 0200)  Labs: Recent Labs    05/10/24 1002 05/10/24 1108 05/11/24 0225 05/11/24 1252 05/11/24 1511 05/11/24 2210 05/12/24 0310  HGB 14.8  --  12.4  --   --   --  12.7  HCT 46.8*  --  37.9  --   --   --  41.1  PLT 395  --  347  --   --   --  329  APTT  --  37*  --   --   --   --   --   LABPROT  --  14.8  --   --   --   --   --   INR  --  1.1  --   --   --   --   --   HEPARINUNFRC  --   --  0.13*   < > 0.33 0.54 0.61  CREATININE 0.58  --  0.43*  --   --   --  0.49  TROPONINIHS  --  16  --   --   --   --   --    < > = values in this interval not displayed.    Estimated Creatinine Clearance: 49.2 mL/min (by C-G formula based on SCr of 0.49 mg/dL).   Medical History: Past Medical History:  Diagnosis Date   Acute lateral meniscus tear of right knee    Anxiety disorder, unspecified    Cataract [facility]   Clotting disorder (HCC) blood circulation/veins in feet and lower legs   Depression 1978   Diverticulosis of colon 04/2005   GERD (gastroesophageal reflux disease)    Glaucoma [facility]   Hypertension    Hyperthyroidism    Dr Damian   Impaired fasting glucose    Low TSH level    chronic low, but euthyroid   Microscopic  hematuria    RBBB    S/p left hip fracture    Sleep disturbance    Subclinical hyperthyroidism    unspecified   Tibia fracture 05/2010   left   Assessment: 78 y/o female presenting with shortness of breath, found to be in new onset atrial fibrillation with RVR. PMH significant for anxiety, depression, GERD, HTN, HLD. Pharmacy has been consulted to initiate heparin  infusion. Per chart review, patient was not on anticoagulation prior to admission.  Baseline labs: hgb 14.8, plt 395, INR 1.1  Date Time HL Rate/Comment 08/17 1252 >1.10 Heparin  level drawn incorrectly, ordered STAT repeat 08/17 1511 0.33 Therapeutic X 1 08/17   2210   0.54     Therapeutic X 2  08/18   0310    0.61    Therapeutic X 3   Goal of Therapy:  Heparin  level 0.3-0.7  units/ml Monitor platelets by anticoagulation protocol: Yes   Plan:  8/18:  HL @ 0310 = 0.61, therapeutic X 3 - will continue pt on current rate and recheck HL on 8/19 with AM labs.  - CBC daily while on heparin  infusion  Thank you for involving pharmacy in this patient's care.   Lacreasha Hinds D Clinical Pharmacist 05/12/2024 4:57 AM

## 2024-05-12 NOTE — Progress Notes (Signed)
 Pt started having pauses in heart rhythm this morning greater than 2 seconds and HR dropped into the low 60s. MD notified and verbal to stop Cardizem  gtt. Pt remains Alert and oriented, heparin  gtt still infusing at 1000 units/hr.

## 2024-05-12 NOTE — Plan of Care (Signed)

## 2024-05-12 NOTE — Care Management Important Message (Signed)
 Important Message  Patient Details  Name: Alexandria Sherman MRN: [id number] Date of Birth: 01-07-1946   Important Message Given:  Yes - Medicare IM     Rojelio [name] 05/12/2024, 2:01 PM

## 2024-05-12 NOTE — Progress Notes (Signed)
   05/12/24 1715  Assess: MEWS Score  Temp 98.5 F (36.9 C)  Pulse Rate 83  ECG Heart Rate (!) 128  Resp (!) 25  SpO2 95 %  Assess: MEWS Score  MEWS Temp 0  MEWS Systolic 0  MEWS Pulse 2  MEWS RR 1  MEWS LOC 0  MEWS Score 3  MEWS Score Color Yellow  Assess: if the MEWS score is Yellow or Red  Were vital signs accurate and taken at a resting state? Yes  Does the patient meet 2 or more of the SIRS criteria? Yes  Does the patient have a confirmed or suspected source of infection? Yes  MEWS guidelines implemented  Yes, yellow  Treat  MEWS Interventions Considered administering scheduled or prn medications/treatments as ordered  Take Vital Signs  Increase Vital Sign Frequency  Yellow: Q2hr x1, continue Q4hrs until patient remains green for 12hrs  Escalate  MEWS: Escalate Yellow: Discuss with charge nurse and consider notifying provider and/or RRT  Notify: Charge Nurse/RN  Name of Charge Nurse/RN Notified Amy Paraschos, RN  Provider Notification  Provider Name/Title Dr. Von  Date Provider Notified 05/12/24  Method of Notification  (secure chat)  Notification Reason Other (Comment) (Yellow MEWS)  Provider response See new orders  Date of Provider Response 05/12/24  Time of Provider Response 1721  Assess: SIRS CRITERIA  SIRS Temperature  0  SIRS Respirations  1  SIRS Pulse 1  SIRS WBC 0  SIRS Score Sum  2   Patient's HR 128 and RR 25 when arriving from ICU. Patient alert with shortness of breath at rest. Notified Dr. Von and MD placed order for lasix .

## 2024-05-12 NOTE — Progress Notes (Signed)
 Progress Note  Patient Name: Alexandria Sherman Date of Encounter: 05/12/2024  Primary Cardiologist: Darron  Subjective   She remains in Afib with improved ventricular rates in the 70s to 80s bpm. Longest R-R interval 2 seconds. No chest pain, dyspnea, palpitations, dizziness, presyncope, or syncope.   Inpatient Medications    Scheduled Meds:  aspirin  EC  81 mg Oral Daily   Chlorhexidine  Gluconate Cloth  6 each Topical QHS   clotrimazole -betamethasone   1 Application Topical BID   divalproex   125 mg Oral BID   latanoprost   1 drop Both Eyes QHS   methIMAzole   15 mg Oral BID   Followed by   NOREEN ON 05/14/2024] methIMAzole   15 mg Oral Daily   metoprolol  tartrate  12.5 mg Oral Q6H   pantoprazole   40 mg Oral Daily   timolol   1 drop Both Eyes Daily   venlafaxine   75 mg Oral BID   Continuous Infusions:  aztreonam  1 g (05/12/24 0504)   diltiazem  (CARDIZEM ) infusion Stopped (05/12/24 0628)   heparin  1,000 Units/hr (05/12/24 0251)   PRN Meds: acetaminophen  **OR** acetaminophen , clorazepate , nystatin , ondansetron  **OR** ondansetron  (ZOFRAN ) IV, polyethylene glycol   Vital Signs    Vitals:   05/12/24 0400 05/12/24 0500 05/12/24 0502 05/12/24 0600  BP: 114/77 121/85 121/85 (!) 115/90  Pulse: (!) 31  100 (!) 111  Resp: (!) 26 (!) 32  (!) 38  Temp:      TempSrc:      SpO2:    91%  Weight:      Height:        Intake/Output Summary (Last 24 hours) at 05/12/2024 0750 Last data filed at 05/12/2024 0251 Gross per 24 hour  Intake 581.85 ml  Output 400 ml  Net 181.85 ml   Filed Weights   05/10/24 1438  Weight: 61.9 kg    Telemetry    Afib with ventricular rates in the 70s to 80s bpm, longest R-R interval 2 seconds - Personally Reviewed  ECG    No new tracings - Personally Reviewed  Physical Exam   GEN: No acute distress.   Neck: No JVD. Cardiac: IRIR, no murmurs, rubs, or gallops.  Respiratory: Clear to auscultation bilaterally.  GI: Soft, nontender, non-distended.    MS: No edema; No deformity. Neuro:  Alert and oriented x 3; Nonfocal.  Psych: Normal affect.  Labs    Chemistry Recent Labs  Lab 05/10/24 1002 05/11/24 0225 05/12/24 0310  NA 139 141 140  K 3.0* 3.9 4.3  CL 98 105 107  CO2 28 27 25   GLUCOSE 167* 126* 121*  BUN 39* 38* 31*  CREATININE 0.58 0.43* 0.49  CALCIUM  9.7 8.9 8.9  PROT 6.7 6.3*  --   ALBUMIN 2.8* 2.4*  --   AST 19 18  --   ALT 19 15  --   ALKPHOS 74 68  --   BILITOT 0.8 0.8  --   GFRNONAA >60 >60 >60  ANIONGAP 13 9 8      Hematology Recent Labs  Lab 05/10/24 1002 05/11/24 0225 05/12/24 0310  WBC 10.8* 11.5* 13.3*  RBC 4.90 4.01 4.18  HGB 14.8 12.4 12.7  HCT 46.8* 37.9 41.1  MCV 95.5 94.5 98.3  MCH 30.2 30.9 30.4  MCHC 31.6 32.7 30.9  RDW 14.4 14.6 14.7  PLT 395 347 329    Cardiac EnzymesNo results for input(s): TROPONINI in the last 168 hours. No results for input(s): TROPIPOC in the last 168 hours.   BNP Recent  Labs  Lab 05/10/24 1108  BNP 277.6*     DDimer No results for input(s): DDIMER in the last 168 hours.   Radiology    CT Angio Chest PE W and/or Wo Contrast Result Date: 05/10/2024 IMPRESSION: 1. No CT evidence for acute pulmonary embolus. 2. Enlargement of the pulmonary outflow tract/main pulmonary arteries suggests pulmonary arterial hypertension. 3. 12 x 8 mm nodule posterior left upper lobe is probably atelectatic. Follow-up CT chest in 3 months recommended to ensure resolution. 4. Trace right-sided pleural effusion. 5. 14 mm left adrenal nodule has attenuation too high to allow characterization as an adenoma. While likely a lipid poor adenoma or other benign finding, follow-up adrenal protocol CT abdomen without and with contrast or abdominal MRI recommended to further evaluate. 6. Multinodular thyroid  gland including 1.5 cm left thyroid  nodule. Recommend thyroid  US  (ref: J Am Coll Radiol. 2015 Feb;12(2): 143-50). 7. Aortic Atherosclerosis (ICD10-I70.0) and Emphysema  (ICD10-J43.9). Electronically Signed   By: Camellia Candle M.D.   On: 05/10/2024 12:42    Cardiac Studies   2D echo 05/11/2024: 1. Left ventricular ejection fraction, by estimation, is 50 to 55%. The  left ventricle has low normal function. The left ventricle has no regional  wall motion abnormalities. Left ventricular diastolic parameters are  indeterminate.   2. Right ventricular systolic function is normal. The right ventricular  size is normal.   3. The mitral valve is normal in structure. Mild mitral valve  regurgitation.   4. The aortic valve is tricuspid. Aortic valve regurgitation is not  visualized.   5. The inferior vena cava is normal in size with greater than 50%  respiratory variability, suggesting right atrial pressure of 3 mmHg.   Patient Profile     78 y.o. female with history of coronary artery calcification noted on CT imaging, chronic sinus tachycardia, HTN, HLD, hypothyroidism, chronic venous insufficiency/lymphedema, anxiety, depression, and GERD who is being seen today for the evaluation of Afib at the request of Dr. Roann.   Assessment & Plan    1. New onset A-fib with RVR:  - Possibly in the setting of sepsis with UTI and hyperthyroidism, as well as hypokalemia  - Uncertain chronicity  - Remains in Afib with improved ventricular rates in the 70s to 80s bpm - Longest R-R interval 2 seconds, no indication for PPM - Now off Cardizem  gtt - Consolidate Lopressor  to 25 mg bid - Hemodynamically stable - Heparin  drip for now with recommendation for DOAC prior to discharge  - Echo with low-normal LV systolic function - CHA2DS2-VASc at least 5 (HTN, age x 2, vascular disease, sex category) - As long as her ventricular rates remain well controlled, plan for rate control for now with plans for outpatient DCCV after she has been adequately anticoagulated for 3-4 weeks without interruption  - However, if she has difficult to control ventricular rates with ambulation prior  to discharge, she may require TEE-guided DCCV prior to discharge, otherwise would pursue DCCV in the outpatient setting (underlying hyperthyroidism may make it challenging to hold sinus rhythm)  2. Hyperthyroidism:  - Now on methimazole   - Likely contributing to A-fib  - Management per primary service - Will need to establish with endocrinology as an outpatient    3. Coronary artery calcification:  - Noted on CT imaging  - High-sensitivity troponin negative x 1  - Obtain echo with further recommendations pending  - Check lipid panel and A1c for further risk stratification (LDL of 84 in 11/2023 with target  LDL less than 70)   4. Abnormal CT findings:  - Further discussion of and follow-up findings deferred to primary service   5. Chronic venous insufficiency/lymphedema:  - Stable to improved from baseline - TED hose while admitted  - Lymphedema pumps at home   6. Hypokalemia:  - Repleted     For questions or updates, please contact [facility] Please consult [email or web address] for contact info under Cardiology/STEMI.    Signed, Bernardino Bring, PA-C [facility] Pager: [phone] 05/12/2024, 7:50 AM

## 2024-05-12 NOTE — Plan of Care (Signed)
  Problem: Education: Goal: Knowledge of disease or condition will improve Outcome: Progressing Goal: Understanding of medication regimen will improve Outcome: Progressing   Problem: Cardiac: Goal: Ability to achieve and maintain adequate cardiopulmonary perfusion will improve Outcome: Progressing   Problem: Coping: Goal: Level of anxiety will decrease Outcome: Progressing   Problem: Activity: Goal: Ability to tolerate increased activity will improve Outcome: Not Progressing   Problem: Nutrition: Goal: Adequate nutrition will be maintained Outcome: Not Progressing

## 2024-05-13 ENCOUNTER — Other Ambulatory Visit (HOSPITAL_COMMUNITY): Payer: Self-pay

## 2024-05-13 ENCOUNTER — Inpatient Hospital Stay

## 2024-05-13 ENCOUNTER — Telehealth (HOSPITAL_COMMUNITY): Payer: Self-pay | Admitting: Pharmacy Technician

## 2024-05-13 ENCOUNTER — Telehealth: Payer: Self-pay

## 2024-05-13 DIAGNOSIS — I4819 Other persistent atrial fibrillation: Secondary | ICD-10-CM | POA: Diagnosis not present

## 2024-05-13 DIAGNOSIS — I4891 Unspecified atrial fibrillation: Secondary | ICD-10-CM | POA: Diagnosis not present

## 2024-05-13 DIAGNOSIS — J9601 Acute respiratory failure with hypoxia: Secondary | ICD-10-CM | POA: Diagnosis not present

## 2024-05-13 LAB — D-DIMER, QUANTITATIVE: D-Dimer, Quant: 0.86 ug{FEU}/mL — ABNORMAL HIGH (ref 0.00–0.50)

## 2024-05-13 LAB — COMPREHENSIVE METABOLIC PANEL WITH GFR
ALT: 18 U/L (ref 0–44)
AST: 14 U/L — ABNORMAL LOW (ref 15–41)
Albumin: 2.3 g/dL — ABNORMAL LOW (ref 3.5–5.0)
Alkaline Phosphatase: 67 U/L (ref 38–126)
Anion gap: 8 (ref 5–15)
BUN: 31 mg/dL — ABNORMAL HIGH (ref 8–23)
CO2: 27 mmol/L (ref 22–32)
Calcium: 9.3 mg/dL (ref 8.9–10.3)
Chloride: 103 mmol/L (ref 98–111)
Creatinine, Ser: 0.46 mg/dL (ref 0.44–1.00)
GFR, Estimated: >60 mL/min (ref >60)
Glucose, Bld: 121 mg/dL — ABNORMAL HIGH (ref 70–99)
Potassium: 4.1 mmol/L (ref 3.5–5.1)
Sodium: 138 mmol/L (ref 135–145)
Total Bilirubin: 0.7 mg/dL (ref 0.0–1.2)
Total Protein: 6.3 g/dL — ABNORMAL LOW (ref 6.5–8.1)

## 2024-05-13 LAB — CBC WITH DIFFERENTIAL/PLATELET
Abs Immature Granulocytes: 0.14 K/uL — ABNORMAL HIGH (ref 0.00–0.07)
Basophils Absolute: 0 K/uL (ref 0.0–0.1)
Basophils Relative: 0 %
Eosinophils Absolute: 0.1 K/uL (ref 0.0–0.5)
Eosinophils Relative: 1 %
HCT: 40.5 % (ref 36.0–46.0)
Hemoglobin: 12.9 g/dL (ref 12.0–15.0)
Immature Granulocytes: 1 %
Lymphocytes Relative: 11 %
Lymphs Abs: 1.5 K/uL (ref 0.7–4.0)
MCH: 30.9 pg (ref 26.0–34.0)
MCHC: 31.9 g/dL (ref 30.0–36.0)
MCV: 97.1 fL (ref 80.0–100.0)
Monocytes Absolute: 0.9 K/uL (ref 0.1–1.0)
Monocytes Relative: 7 %
Neutro Abs: 10.5 K/uL — ABNORMAL HIGH (ref 1.7–7.7)
Neutrophils Relative %: 80 %
Platelets: 298 K/uL (ref 150–400)
RBC: 4.17 MIL/uL (ref 3.87–5.11)
RDW: 14.7 % (ref 11.5–15.5)
WBC: 13.2 K/uL — ABNORMAL HIGH (ref 4.0–10.5)
nRBC: 0 % (ref 0.0–0.2)

## 2024-05-13 LAB — PHOSPHORUS: Phosphorus: 4.2 mg/dL (ref 2.5–4.6)

## 2024-05-13 LAB — BASIC METABOLIC PANEL WITH GFR
Anion gap: 8 (ref 5–15)
BUN: 32 mg/dL — ABNORMAL HIGH (ref 8–23)
CO2: 28 mmol/L (ref 22–32)
Calcium: 9.2 mg/dL (ref 8.9–10.3)
Chloride: 102 mmol/L (ref 98–111)
Creatinine, Ser: 0.58 mg/dL (ref 0.44–1.00)
GFR, Estimated: >60 mL/min (ref >60)
Glucose, Bld: 97 mg/dL (ref 70–99)
Potassium: 4.1 mmol/L (ref 3.5–5.1)
Sodium: 138 mmol/L (ref 135–145)

## 2024-05-13 LAB — CBC
HCT: 41.3 % (ref 36.0–46.0)
Hemoglobin: 12.7 g/dL (ref 12.0–15.0)
MCH: 30.5 pg (ref 26.0–34.0)
MCHC: 30.8 g/dL (ref 30.0–36.0)
MCV: 99.3 fL (ref 80.0–100.0)
Platelets: 283 K/uL (ref 150–400)
RBC: 4.16 MIL/uL (ref 3.87–5.11)
RDW: 14.5 % (ref 11.5–15.5)
WBC: 12.1 K/uL — ABNORMAL HIGH (ref 4.0–10.5)
nRBC: 0 % (ref 0.0–0.2)

## 2024-05-13 LAB — T3, FREE: T3, Free: 2.7 pg/mL (ref 2.0–4.4)

## 2024-05-13 LAB — BLOOD GAS, ARTERIAL
Acid-Base Excess: 2.8 mmol/L — ABNORMAL HIGH (ref 0.0–2.0)
Bicarbonate: 31 mmol/L — ABNORMAL HIGH (ref 20.0–28.0)
O2 Content: 2 L/min
O2 Saturation: 94.2 %
Patient temperature: 37
pCO2 arterial: 63 mmHg — ABNORMAL HIGH (ref 32–48)
pH, Arterial: 7.3 — ABNORMAL LOW (ref 7.35–7.45)
pO2, Arterial: 65 mmHg — ABNORMAL LOW (ref 83–108)

## 2024-05-13 LAB — BRAIN NATRIURETIC PEPTIDE
B Natriuretic Peptide: 813.2 pg/mL — ABNORMAL HIGH (ref 0.0–100.0)
B Natriuretic Peptide: 622.6 pg/mL — ABNORMAL HIGH (ref 0.0–100.0)

## 2024-05-13 LAB — T4, FREE: Free T4: 0.81 ng/dL (ref 0.61–1.12)

## 2024-05-13 LAB — HEMOGLOBIN A1C
Hgb A1c MFr Bld: 5.7 % — ABNORMAL HIGH (ref 4.8–5.6)
Mean Plasma Glucose: 117 mg/dL

## 2024-05-13 LAB — LACTIC ACID, PLASMA: Lactic Acid, Venous: 0.8 mmol/L (ref 0.5–1.9)

## 2024-05-13 LAB — MAGNESIUM: Magnesium: 2.1 mg/dL (ref 1.7–2.4)

## 2024-05-13 MED ORDER — METOPROLOL TARTRATE 25 MG PO TABS
25.0000 mg | ORAL_TABLET | Freq: Four times a day (QID) | ORAL | Status: AC
  Administered 2024-05-13 – 2024-05-15 (×9): 25 mg via ORAL
  Filled 2024-05-13 (×9): qty 1

## 2024-05-13 MED ORDER — QUETIAPINE FUMARATE 25 MG PO TABS
25.0000 mg | ORAL_TABLET | Freq: Every day | ORAL | Status: DC
  Administered 2024-05-13 – 2024-05-14 (×2): 25 mg via ORAL
  Filled 2024-05-13 (×2): qty 1

## 2024-05-13 MED ORDER — IPRATROPIUM-ALBUTEROL 0.5-2.5 (3) MG/3ML IN SOLN: 3.0000 mL | Freq: Four times a day (QID) | RESPIRATORY_TRACT | Status: DC | PRN

## 2024-05-13 MED ORDER — FUROSEMIDE 10 MG/ML IJ SOLN
20.0000 mg | Freq: Every day | INTRAMUSCULAR | Status: DC
  Administered 2024-05-13 – 2024-05-19 (×7): 20 mg via INTRAVENOUS
  Filled 2024-05-13 (×7): qty 2

## 2024-05-13 MED ORDER — FUROSEMIDE 10 MG/ML IJ SOLN
20.0000 mg | Freq: Once | INTRAMUSCULAR | Status: AC
  Administered 2024-05-13: 20 mg via INTRAVENOUS
  Filled 2024-05-13: qty 2

## 2024-05-13 MED ORDER — DIGOXIN 0.25 MG/ML IJ SOLN
0.2500 mg | Freq: Once | INTRAMUSCULAR | Status: AC
  Administered 2024-05-13: 0.25 mg via INTRAVENOUS
  Filled 2024-05-13: qty 2

## 2024-05-13 MED ORDER — MORPHINE SULFATE (PF) 2 MG/ML IV SOLN
1.0000 mg | Freq: Once | INTRAVENOUS | Status: AC
  Administered 2024-05-13: 1 mg via INTRAVENOUS
  Filled 2024-05-13: qty 1

## 2024-05-13 MED ORDER — ENSURE PLUS HIGH PROTEIN PO LIQD
237.0000 mL | Freq: Two times a day (BID) | ORAL | Status: DC
  Administered 2024-05-14 – 2024-05-19 (×4): 237 mL via ORAL

## 2024-05-13 NOTE — Progress Notes (Signed)
 Patient continues to have cold, red, blue, purple discoloration to bilateral feet. Writer was unable to get pedal pulses with doppler. ICU Charge Nurse Lesley  was able to locate pedal pulses with doppler, very weak and faint. Bilateral hands discolored red and pink, right hand cooler than left. All new orders completed, currently on BiPAP.  Continue to reposition and monitor VS. Call light and personal phone within reach.

## 2024-05-13 NOTE — Discharge Instructions (Addendum)
 Information on my medicine - ELIQUIS  (apixaban )  This medication education was reviewed with me or my healthcare representative as part of my discharge preparation.  The pharmacist that spoke with me during my hospital stay was:    Why was Eliquis  prescribed for you? Eliquis  was prescribed for you to reduce the risk of a blood clot forming that can cause a stroke if you have a medical condition called atrial fibrillation (a type of irregular heartbeat).  What do You need to know about Eliquis  ? Take your Eliquis  TWICE DAILY - one tablet in the morning and one tablet in the evening with or without food. If you have difficulty swallowing the tablet whole please discuss with your pharmacist how to take the medication safely.  Take Eliquis  exactly as prescribed by your doctor and DO NOT stop taking Eliquis  without talking to the doctor who prescribed the medication.  Stopping may increase your risk of developing a stroke.  Refill your prescription before you run out.  After discharge, you should have regular check-up appointments with your healthcare provider that is prescribing your Eliquis .  In the future your dose may need to be changed if your kidney function or weight changes by a significant amount or as you get older.  What do you do if you miss a dose? If you miss a dose, take it as soon as you remember on the same day and resume taking twice daily.  Do not take more than one dose of ELIQUIS  at the same time to make up a missed dose.  Important Safety Information A possible side effect of Eliquis  is bleeding. You should call your healthcare provider right away if you experience any of the following: Bleeding from an injury or your nose that does not stop. Unusual colored urine (red or dark brown) or unusual colored stools (red or black). Unusual bruising for unknown reasons. A serious fall or if you hit your head (even if there is no bleeding).  Some medicines may interact with  Eliquis  and might increase your risk of bleeding or clotting while on Eliquis . To help avoid this, consult your healthcare provider or pharmacist prior to using any new prescription or non-prescription medications, including herbals, vitamins, non-steroidal anti-inflammatory drugs (NSAIDs) and supplements.  This website has more information on Eliquis  (apixaban ): [email or web address] .[email or web address] [email or web address]    Food Resources  Agency Name: [facility] Agency Address: [address] [place], [place] [address] Phone: [phone] Website: www.[email or web address] Service(s) Offered: Housing services, self-sufficiency, congregate meal program, weatherization program, Event organiser program, emergency food assistance,  housing counseling, home ownership program, wheels - to work program.  [employer] free for 60 and older at various locations from [employer], Monday-Friday:  [employer], [address] [place], [phone] -[facility], [address] [place] [phone]  -[facility], [address], [place] [phone]  -[address], [address] [place], [phone]  Agency Name: [facility] Address: [phone] [address], [place], [place] [address] Phone: [phone] Website: [email or web address] Service(s) Offered: Home delivered hot, frozen, and emergency  meals. Grocery assistance program which matches  volunteers one-on-one with seniors unable to grocery shop  for themselves. Must be 60 years and older; less than 20  hours of in-home aide service, limited or no driving ability;  live alone or with someone with a disability; live in  [place].  Agency Name: [employer] [facility] [employer]) Address: [address], [place], [place] [address] Phone: [phone] Service(s) Offered: Food  is served to shut-ins, homeless, elderly, and low income people in the community  every Saturday (11:30 am-12:30 pm) and Sunday (12:30 pm-1:30pm). Volunteers also offer help and encouragement in seeking employment,  and spiritual guidance.  Agency Name: [employer] Address: [address] [place], [place] [address] Phone: [phone] Service(s) Offered: Child support services; child welfare services; food stamps; Medicaid; work first family assistance; and aid with fuel,  rent, food and medicine.  Agency Name: [employer]itian Address: [address] [place], [place] Phone: [phone] Website: [email or web address] Services Offered: Monday 10:00am-12:00, 8:00pm-9:00pm, and Friday 10:00am-12:00.  Agency Name: [employer] Address: [address], [place], [place] [address] Phone: [phone] Website: [email or web address] Service(s) Offered: Serves weekday meals, open from 11:30 am- 1:00 pm., and 6:30-7:30pm, Monday-Wednesday-Friday distributes food 3:30-6pm, Monday-Wednesday-Friday.  Agency Name: [facility] Address: [address] [place], [place] Phone: [phone] Website: [email or web address] Services Offered: Distributes food the 4th Saturday of the month, starting at 8:00 am  Agency Name: [facility] Address: [phone] [address], [place], [place] [address] Phone: [phone] Website: [email or web address] Service(s) Offered: Bread of life, weekly food pantry. Open Wednesdays from 10:00am-noon.  Agency Name: [facility] [employer] Address: [address] [place], [place], [place] Phone: [phone] Services Offered: Distributes food 9am-1pm, Monday-Thursday. Call for details.  Agency Name: [facility] Address: [address] [place], [place] [address] Phone: [phone] Website: [email or web address] Service(s) Offered: Games developer. Call for assistance.  Agency Name: [facility]  [facility] Address: [address] [place] [address] [place], [place] [address] Phone: [phone] Service Offered: Emergency Food Pantry. Call for appointment.  Agency Name: [facility] Address: [address], [place], [place] [address] Phone: [phone] Website: [email or web address] Services Offered: Games developer. Call for details  Agency Name: [facility] Address: [address] [place], [place] Phone: [phone] Website: [email or web address] Service(s) Offered: Emergency Food Pantry. Call for details.  Agency Name: Holiday representative Address: [address] [place], [place] [address] Phone: [phone] or [phone] Website: [email or web address] Service(s) Offered: Distribute food 9am-11:30 am, Tuesday-Friday, and 1-3:30pm, Monday-Friday. Food pantry Monday-Friday 1pm-3pm, fresh items, Mon.-Wed.-Fri.  Agency Name: [facility] Address: [address] [place], [place] [address] Phone: [phone] Website: [email or web address] Services Offered: Distribute food Tues and Sats from 9:00am-noon. Closed 1st Saturday of each month. Call for details  Agency Name: [employer] Address: Fayrene Ava [facility] [address] [place] [address] Phone: [phone]  Services Offered: Delivers meals every Thursday   Do you feel isolated?  [facility] [employer] offers a [employer] that anyone can call toll free at [phone]. The friendship line is available 24 hours a day  [employer] is a Program of All-inclusive Care for the Elderly (PACE). Their mission is to promote and sustain the independence of seniors wishing to remain in the community. They provide seniors with comprehensive long-term health, social, medical and dietary care. Their program is a safe alternative to nursing home care. [phone]  [facility] Physical Address [facility] [address] [place], [place]  [address] Phone: [phone]. . Online zoom yoga class, connect with others without leaving your home [facility] offers [place] dance cardio sessions for individuals via Zoom. This program provides: - Dance fitness activities Please contact program for more information. Servinganyone in need adults 18+ hiv/aids individuals families Call [phone]  Email [email or web address] to get more info  [employer] offers an online  [employer] Neighborhood Center to individuals where they can receive help to focus on their best health. Whether you're a [employer] member or not, the neighborhood center offers a... Main Serviceshealth education  exercise & fitness  community support services  recreation  virtual support Other Servicessupport groups Servinganyone in need adults young adults teens seniors individuals families [email or web address] to get more info  Schedule on their website  [facility] offers an array of activities for adults age 62 and over. This program provides:- Fitness and health programs- Tech classes- Activity books Main Serviceshealth education  community support services  exercise & fitness  recreation  more education Servingseniors  Call [phone]    For more resources go online to [email or web address] and type in you zipcode

## 2024-05-13 NOTE — Progress Notes (Signed)
 Progress Note  Patient Name: Alexandria Sherman Date of Encounter: 05/13/2024 [facility] Cardiologist: Deatrice Cage, MD   Interval Summary   Patient feels well, denying chest pain, shortness of breath, and palpitations, though she notes that people often feel that she looks short of breath.  She notes some chronic discoloration of her legs that she attributes to venous insufficiency.  No pain or paresthesias reported today in either lower extremity.  Vital Signs Vitals:   05/13/24 0530 05/13/24 0700 05/13/24 0730 05/13/24 0800  BP: 91/74 (!) 88/71 101/61 114/81  Pulse: (!) 38 (!) 53 77 (!) 109  Resp: (!) 25 15 15  (!) 25  Temp:    97.8 F (36.6 C)  TempSrc:    Oral  SpO2: 97% 100% 98% 99%  Weight:      Height:        Intake/Output Summary (Last 24 hours) at 05/13/2024 0913 Last data filed at 05/13/2024 0400 Gross per 24 hour  Intake 703.96 ml  Output 1200 ml  Net -496.04 ml      05/10/2024    2:38 PM 04/03/2024   12:15 PM 03/31/2024    9:27 AM  Last 3 Weights  Weight (lbs) 136 lb 7.4 oz 130 lb 130 lb  Weight (kg) 61.9 kg 58.968 kg 58.968 kg      Telemetry/ECG  Atrial fibrillation with ventricular rates 90-140 bpm. - Personally Reviewed  Physical Exam  GEN: No acute distress.   Neck: JVP approximately 6 to 8 cm. Cardiac: Tachycardic and irregularly irregular rhythm with 1/6 systolic murmur. Respiratory: Decreased breath sounds at both lung bases.  No wheezes or crackles.  Patient appears mildly tachypneic. GI: Soft, nontender, non-distended  MS: Trace pretibial edema noted bilaterally.  Compression stockings in place with dusky/bluish toes and feet after removal of compression stockings.  Dorsalis pedis pulses not palpable.  Assessment & Plan  New onset atrial fibrillation: Atrial fibrillation thought to be precipitated by sepsis and hyperthyroidism as well as electrolyte abnormalities.  Patient has been taken off diltiazem  infusion with continued significant  variability in ventricular rates.  I will hold off on resuming diltiazem  due to intermittent soft blood pressures and instead increase metoprolol  to tartrate to 25 mg every 6 hours.  Continued treatment of sepsis and hypothyroidism per primary team recommended.  Continue apixaban  5 mg twice daily.  If ventricular rates remain difficult to control, will need to consider TEE-guided cardioversion this admission.  However, I would favor better control of her hypothyroidism before moving forward with this given high risk for recurrence of atrial fibrillation.  Amiodarone not ideal given that she was previously not anticoagulated and is also hyperthyroid.  Acute respiratory failure with hypoxia: Patient denies feeling short of breath but appears tachypneic on exam.  JVP also appears elevated though some accessory muscle use limits evaluation.  Continue supplemental oxygen per primary team.  Will continue diuresing and gently.  Hyperthyroidism: Suppressed TSH noted in the past.  Patient just started on methimazole .  Ongoing management per primary team with consideration for outpatient endocrinology consultation.  Dilated pulmonary artery: Incidentally noted on CTA chest.  Patient appears tachypneic with elevated JVP though she denies feeling short of breath.  Echocardiogram 2 days ago showed normal biventricular systolic function and normal CVP.  PA pressure not estimated.  Will redose furosemide  20 mg IV daily with careful monitoring of renal function and blood pressure.  Sepsis: Urinary source suspected with culture growing E. coli.  Continue Bactrim  per primary team.  Venous insufficiency/lymphedema and possible PAD: Patient previously thought to have lymphedema and venous insufficiency with compression stockings in place.  Only mild edema noted on exam today though feet and toes appear quite discolored and dusky.  I was unable to palpate dorsalis pedis pulse in either foot.  Recommend leaving off  compression stockings at this time while awaiting arterial Dopplers to be performed (already ordered by hospitalist team yesterday).  Continue apixaban  for now.  Of note, ABIs were normal last year, which argues against critical limb ischemia.  However, cardioembolic events are a possibility in the setting of newly diagnosed atrial fibrillation.  For questions or updates, please contact [facility] Please consult [email or web address] for contact info under [facility] cardiology  Signed, Lonni Hanson, MD

## 2024-05-13 NOTE — Progress Notes (Signed)
 [facility] Progress Note  Patient: Alexandria Sherman    FMW:[id number]  DOA: 05/10/2024     Date of Service: the patient was seen and examined on 05/13/2024  Chief Complaint  Patient presents with   Weakness   Brief hospital course: [name] is a 78 y.o. female with medical history significant of anxiety, depression, GERD, HTN, HLD, subclinical hyperthyroidism who was brought in to [facility] ED by ambulance for generalized weakness and shortness of breath that has worsened over the last week.  Patient lives alone but she does have a caregiver at home.  With EMS her heart rate was noted to have in 160s.  Patient tells me that she has a bilateral lower extremity swelling and chronic skin changes but does not carry the diagnosis of atrial fibrillation or congestive heart failure.  She also complains some dysuria but no fever no chills.  She denies any chest pain or palpitations.  She denies any diarrhea or vomiting.  Her main complaint was she was not able to walk around at her home.   In the ED, patient was found to be in atrial fibrillation with rapid ventricular response, new onset and also patient has positive urine analysis.  She also has a low TSH level which appears to be chronic in nature.  Patient was started on Cardizem  drip in the emergency room and was started on antibiotic.  Patient has a history of blisters with the use of cephalosporins in her vaginal area.   Hospitalist service was consulted for evaluation for admission for sepsis due to urinary tract infection and atrial fibrillation with rapid ventricular response.   Assessment and Plan:  # Acute hypoxic respiratory failure with pulmonary edema Continue supplemental O2 inhalation and gradually wean off 8/18 s/p Lasix  40 mg x 1 dose given 8/19 Lasix  20 mg IV daily  # Sepsis due to urinary tract infection Sepsis criteria, tachycardia, tachypneic and leukocytosis with UTI She is allergic to cephalosporin which causes  blisters in her vaginal area. S/p Aztreonam , urine culture grew E. coli.  As per sensitivity patient was transition to Bactrimx 3 days Blood culture NGTD  # Atrial fibrillation w/RVR new onset, POA CHADS2 DS 2 vasc score 4 (Age 35+sex 1+ HTN 1) S/p Cardizem  IV infusion, weaned off S/p Heparin  IV gtt, transition to Eliquis  on 8/18 8/18 s/p digoxin  0.25 mg IV x 2 doses  8/19 changed Lopressor  25 mg p.o. q6 Continue to monitor on telemetry TTE LVEF 50 to 55%, no WMA, no any significant findings Follow cardiology  # Hypethyroidism Undiagnosed previously, patient is poor historian, she is aware that her thyroid  functions were off, does not remember taking any medications TSH very low and free T4 slightly elevated Free T3 level is pending 8/17 started methimazole  15 mg p.o. TID x 2 doses, and BID x 2 doses, followed by daily  8/19 Free T4 level 0.81 gradually decreasing Check thyroid  antibodies Thyroid  sonogram: Right thyroid  nodule, needs monitoring Patient was not need referral to endocrinologist as an outpatient Trend free T4 daily  # Lymphedema of lower extremities, with skin changes possible fungal infection  Started Lotrisone  twice daily RN was advised to clean with soap water before each application Keep lower extremities elevated while in bed Continue Lasix  as above   # Anxiety/depression - Continue her home medications     # Hypokalemia - Replace potassium  # Sundowning Started Seroquel  25 mg every evening  Glaucoma, continued eyedrops   Body mass index is 23.42  kg/m.  Interventions:  Diet: Heart healthy/carb modified diet DVT Prophylaxis: Heparin  IV infusion  Advance goals of care discussion: Full code  Family Communication: family was not present at bedside, at the time of interview.  The pt provided permission to discuss medical plan with the family. Opportunity was given to ask question and all questions were answered satisfactorily.  8/19 management plan  discussed with patient's family at bedside  Disposition:  Pt is from Home, admitted with sepsis due to UTI and A-fib with RVR, still has A. fib, which precludes a safe discharge. Discharge to home, when stable, may need few days to improve.  Subjective: No significant events overnight, mild shortness of breath, patient became confused last evening possible sundowning.  Patient is not awake, denied any complaints.    Physical Exam: General: NAD, lying comfortably, anxious Appear in mild distress, affect appropriate Eyes: PERRLA ENT: Oral Mucosa Clear, moist  Neck: no JVD,  Cardiovascular: Irregular rhythm, no Murmur,  Respiratory: Equal air entry bilaterally, mild bibasilar crackles, no wheezes Abdomen: Bowel Sound present, Soft and no tenderness,  Skin: no rashes Extremities: b/l chronic lymphedema, erythema and scaling possible fungal infection, no calf tenderness Neurologic: without any new focal findings Gait not checked due to patient safety concerns  Vitals:   05/13/24 1600 05/13/24 1636 05/13/24 1638 05/13/24 1700  BP: 95/67 107/74 107/74 106/76  Pulse:  96 96 (!) 57  Resp: (!) 27 (!) 24  (!) 27  Temp:    98.1 F (36.7 C)  TempSrc:    Axillary  SpO2:  99%  100%  Weight:      Height:        Intake/Output Summary (Last 24 hours) at 05/13/2024 1712 Last data filed at 05/13/2024 1327 Gross per 24 hour  Intake 983.97 ml  Output 1650 ml  Net -666.03 ml   Filed Weights   05/10/24 1438  Weight: 61.9 kg    Data Reviewed: I have personally reviewed and interpreted daily labs, tele strips, imagings as discussed above. I reviewed all nursing notes, pharmacy notes, vitals, pertinent old records I have discussed plan of care as described above with RN and patient/family.  CBC: Recent Labs  Lab 05/10/24 1002 05/11/24 0225 05/12/24 0310 05/13/24 0020 05/13/24 0351  WBC 10.8* 11.5* 13.3* 13.2* 12.1*  NEUTROABS  --   --   --  10.5*  --   HGB 14.8 12.4 12.7 12.9 12.7   HCT 46.8* 37.9 41.1 40.5 41.3  MCV 95.5 94.5 98.3 97.1 99.3  PLT 395 347 329 298 283   Basic Metabolic Panel: Recent Labs  Lab 05/10/24 1002 05/11/24 0225 05/12/24 0310 05/13/24 0020 05/13/24 0351  NA 139 141 140 138 138  K 3.0* 3.9 4.3 4.1 4.1  CL 98 105 107 103 102  CO2 28 27 25 27 28   GLUCOSE 167* 126* 121* 121* 97  BUN 39* 38* 31* 31* 32*  CREATININE 0.58 0.43* 0.49 0.46 0.58  CALCIUM  9.7 8.9 8.9 9.3 9.2  MG 2.3 2.1 2.1  --  2.1  PHOS  --  2.9 4.2  --  4.2    Studies: DG Chest 1 View Result Date: 05/13/2024 CLINICAL DATA:  10027.  Tachypnea, unable to lie flat. EXAM: CHEST  1 VIEW COMPARISON:  Portable chest yesterday at 10:21 a.m. FINDINGS: 12:46 a.m. Stable cardiomegaly. Normal caliber of the central vessels. Expiratory exam with opacities in the left greater than right bases consistent with atelectasis or consolidation. Bilateral perihilar linear atelectasis again is  noted and appears similar. No new lung opacity. The more cephalad lungs remain clear. Mediastinum is normally outlined. Osteopenia. There are degenerative changes of the shoulders and spine. No new osseous finding. IMPRESSION: 1. Expiratory exam with left greater than right basilar atelectasis or consolidation. 2. Stable cardiomegaly. 3. No new lung opacity. Electronically Signed   By: Francis Quam M.D.   On: 05/13/2024 00:55    Scheduled Meds:  apixaban   5 mg Oral BID   Chlorhexidine  Gluconate Cloth  6 each Topical QHS   clotrimazole -betamethasone   1 Application Topical BID   divalproex   125 mg Oral BID   furosemide   20 mg Intravenous Daily   latanoprost   1 drop Both Eyes QHS   methIMAzole   15 mg Oral Daily   metoprolol  tartrate  25 mg Oral Q6H   pantoprazole   40 mg Oral Daily   QUEtiapine   25 mg Oral QPC supper   sulfamethoxazole -trimethoprim   1 tablet Oral Q12H   timolol   1 drop Both Eyes Daily   venlafaxine   75 mg Oral BID   Continuous Infusions:   PRN Meds: acetaminophen  **OR** acetaminophen ,  clorazepate , ipratropium-albuterol , nystatin , ondansetron  **OR** ondansetron  (ZOFRAN ) IV, polyethylene glycol  Time spent: 55 minutes  Author: ELVAN SOR. MD [facility] 05/13/2024 5:12 PM  To reach On-call, see care teams to locate the attending and reach out to them via [email or web address]. If 7PM-7AM, please contact night-coverage If you still have difficulty reaching the attending provider, please page the [facility] (Director on Call) for [facility] on amion for assistance.

## 2024-05-13 NOTE — Plan of Care (Signed)
  Problem: Elimination: Goal: Will not experience complications related to urinary retention Outcome: Progressing   Problem: Pain Managment: Goal: General experience of comfort will improve and/or be controlled Outcome: Progressing   Problem: Safety: Goal: Ability to remain free from injury will improve Outcome: Progressing   Problem: Skin Integrity: Goal: Risk for impaired skin integrity will decrease Outcome: Progressing

## 2024-05-13 NOTE — Telephone Encounter (Signed)
 Patient Product/process development scientist completed.    The patient is insured through [employer]. Patient has Medicare and is not eligible for a copay card, but may be able to apply for patient assistance or Medicare RX Payment Plan (Patient Must reach out to their plan, if eligible for payment plan), if available.    Ran test claim for Eliquis 5 mg and the current 30 day co-pay is $40.00.  Ran test claim for Xarelto 20 mg and the current 30 day co-pay is $40.00.  This test claim was processed through [facility]- copay amounts may vary at other pharmacies due to pharmacy/plan contracts, or as the patient moves through the different stages of their insurance plan.     Roland Earl, CPHT Pharmacy Technician III Certified Patient Advocate [facility] Pharmacy Patient Advocate Team Direct Number: [phone]  Fax: [phone]

## 2024-05-13 NOTE — Plan of Care (Signed)
  Problem: Cardiac: Goal: Ability to achieve and maintain adequate cardiopulmonary perfusion will improve Outcome: Progressing   Problem: Clinical Measurements: Goal: Respiratory complications will improve Outcome: Progressing   Problem: Coping: Goal: Level of anxiety will decrease Outcome: Progressing   Problem: Elimination: Goal: Will not experience complications related to urinary retention Outcome: Progressing   Problem: Pain Managment: Goal: General experience of comfort will improve and/or be controlled Outcome: Progressing   Problem: Safety: Goal: Ability to remain free from injury will improve Outcome: Progressing

## 2024-05-13 NOTE — Progress Notes (Addendum)
 Critical care note  Date of note: 05/13/2024  Subjective: This patient was admitted with AFIB with RVR, Sepsis UTI. HX Anxiety, depression GERD, HTN, HLD, Hypothyroid. Sent to 2A this afternoon. She was in ICU on IV Cardizem , last night with RVR.  Tonight  HR was down 60 and 2 second pauses. She arrieved to 2 A at 5 pm. PRN metoprolol  given for HR over 110 at 1852. Ordered q 6 hr. HR is still over 110 as high as 140. Her bilateral feet are cold, red and purple.  Nurses initially could not find any pedal pulses with doppler , charge nurse also tried.  Posterior tibial pulses were felt.  The patient was alert to self..  The patient has been on heparin  that was switched to p.o. Eliquis .  Objective: Physical examination: Generally: Acutely ill elderly Caucasian female in mild respiratory distress with conversational dyspnea. Vital signs: Heart rate was 120 and respiratory rate 27 then 34-38 with a BP initially normal coming down low to 96/82.  The patient was afebrile. Head - atraumatic, normocephalic.  Pupils - equal, round and reactive to light and accommodation. Extraocular movements are intact. No scleral icterus.  Oropharynx - moist mucous membranes and tongue. No pharyngeal erythema or exudate.  Neck - supple. No JVD. Carotid pulses 2+ bilaterally. No carotid bruits. No palpable thyromegaly or lymphadenopathy. Cardiovascular - regular rate and rhythm. Normal S1 and S2. No murmurs, gallops or rubs.  Lungs -diminished bibasilar breath sounds with bibasilar rales..  Abdomen - soft and nontender. Positive bowel sounds. No palpable organomegaly or masses.  Extremities - no pitting edema, clubbing or cyanosis.  She has mild purplish discoloration of her foot and toes. Neuro - grossly non-focal. Skin -as above and no rashes. Breast, pelvic and rectal - deferred.  Portable chest x-ray showed the following: 1. Expiratory exam with left greater than right basilar atelectasis or consolidation. 2.  Stable cardiomegaly. 3. No new lung opacity.  Assessment/plan: 1.  Acute respiratory failure with hypoxia: - The patient was given 20 mg of IV Lasix  and 1 mg of IV morphine  sulfate as well as DuoNeb with clinical improvement. - She was placed on 40% FiO2 with pulse oximetry that was up to 99-100%. 2.  Atrial fibrillation with RVR. - The patient was initially given 0.5 mg of IV digoxin  and 250 mL IV normal saline bolus.  Heart rate has  responded. 3.  Peripheral arterial disease. - Bilateral arterial Dopplers was ordered.  DP pulses were initially not palpated but later on were found with Doppler.      -She may need vascular consult in a.m.  Will continue to closely monitor her.  Authorized and performed by: Madison Peaches, MD Total critical care time: 30      minutes. Due to a high probability of clinically significant, life-threatening deterioration, the patient required my highest level of preparedness to intervene emergently and I personally spent this critical care time directly and personally managing the patient.  This critical care time included obtaining a history, examining the patient, pulse oximetry, ordering and review of studies, arranging urgent treatment with development of management plan, evaluation of patient's response to treatment, frequent reassessment, and discussions with other providers. This critical care time was performed to assess and manage the high probability of imminent, life-threatening deterioration that could result in multiorgan failure.  It was exclusive of separately billable procedures and treating other patients and teaching time.

## 2024-05-13 NOTE — Progress Notes (Signed)
   05/12/24 2330  Assess: MEWS Score  BP (!) 124/97  MAP (mmHg) 108  Pulse Rate 67  ECG Heart Rate (!) 110  Resp 19  SpO2 97 %  Assess: MEWS Score  MEWS Temp 0  MEWS Systolic 0  MEWS Pulse 1  MEWS RR 0  MEWS LOC 1  MEWS Score 2  MEWS Score Color Yellow  Assess: if the MEWS score is Yellow or Red  Were vital signs accurate and taken at a resting state? Yes  Does the patient meet 2 or more of the SIRS criteria? Yes  Does the patient have a confirmed or suspected source of infection? Yes  MEWS guidelines implemented  No, previously red, continue vital signs every 4 hours  Notify: Charge Nurse/RN  Name of Charge Nurse/RN Notified Yasmin RN  Provider Notification  Provider Name/Title Dr. Lawence  Date Provider Notified 05/12/24  Time Provider Notified 2339  Method of Notification Page  Notification Reason Other (Comment) (Continues to be a red MEWS of 6. HR, Respirations)  Provider response At bedside  Date of Provider Response 05/13/24  Time of Provider Response 0005  Assess: SIRS CRITERIA  SIRS Temperature  0  SIRS Respirations  0  SIRS Pulse 1  SIRS WBC 0  SIRS Score Sum  1

## 2024-05-13 NOTE — Progress Notes (Addendum)
 PRN Metoprolol  given for HR over 110, and patient requested PRN Clorazepate  3.75 mg for increased anxiety. Dr. Lawence notified, no new orders at this time. Patient more awake and alert at this time. Educated patient that she had to wear the BiPAP for now and respiratory will reevaluate her in the AM.

## 2024-05-13 NOTE — TOC CM/SW Note (Signed)
 Transition of Care [facility]) - Inpatient Brief Assessment   Patient Details  Name: Alexandria Sherman MRN: [id number] Date of Birth: 1945-10-31  [facility] CM/SW Contact:    Lauraine JAYSON Carpen, LCSW Phone Number: 05/13/2024, 2:16 PM   Clinical Narrative: CSW reviewed chart. Patient currently on acute oxygen. Will follow for this potential discharge need. SDOH flags for food and social isolation. Resources added to AVS. No other TOC needs identified. CSW will continue to follow progress. Please place [facility] consult if any other needs arise.  Transition of Care Asessment: Insurance and Status: Insurance coverage has been reviewed Patient has primary care physician: Yes Home environment has been reviewed: Single family home Prior level of function:: Not documented Prior/Current Home Services: No current home services Social Drivers of Health Review: SDOH reviewed interventions complete Readmission risk has been reviewed: Yes Transition of care needs: transition of care needs identified, TOC will continue to follow

## 2024-05-14 ENCOUNTER — Inpatient Hospital Stay

## 2024-05-14 ENCOUNTER — Telehealth: Payer: Self-pay

## 2024-05-14 DIAGNOSIS — A419 Sepsis, unspecified organism: Secondary | ICD-10-CM

## 2024-05-14 DIAGNOSIS — R531 Weakness: Secondary | ICD-10-CM | POA: Diagnosis not present

## 2024-05-14 DIAGNOSIS — R0602 Shortness of breath: Secondary | ICD-10-CM

## 2024-05-14 DIAGNOSIS — I4891 Unspecified atrial fibrillation: Secondary | ICD-10-CM | POA: Diagnosis not present

## 2024-05-14 DIAGNOSIS — E876 Hypokalemia: Secondary | ICD-10-CM

## 2024-05-14 DIAGNOSIS — N390 Urinary tract infection, site not specified: Secondary | ICD-10-CM | POA: Diagnosis not present

## 2024-05-14 HISTORY — DX: Sepsis, unspecified organism: A41.9

## 2024-05-14 LAB — CBC
HCT: 42.2 % (ref 36.0–46.0)
Hemoglobin: 12.8 g/dL (ref 12.0–15.0)
MCH: 30.8 pg (ref 26.0–34.0)
MCHC: 30.3 g/dL (ref 30.0–36.0)
MCV: 101.7 fL — ABNORMAL HIGH (ref 80.0–100.0)
Platelets: 246 K/uL (ref 150–400)
RBC: 4.15 MIL/uL (ref 3.87–5.11)
RDW: 14.6 % (ref 11.5–15.5)
WBC: 12.3 K/uL — ABNORMAL HIGH (ref 4.0–10.5)
nRBC: 0 % (ref 0.0–0.2)

## 2024-05-14 LAB — THYROID ANTIBODIES (THYROPEROXIDASE & THYROGLOBULIN)
Thyroglobulin Antibody: <1 [IU]/mL (ref 0.0–0.9)
Thyroperoxidase Ab SerPl-aCnc: 12 [IU]/mL (ref 0–34)

## 2024-05-14 LAB — BASIC METABOLIC PANEL WITH GFR
Anion gap: 8 (ref 5–15)
BUN: 33 mg/dL — ABNORMAL HIGH (ref 8–23)
CO2: 27 mmol/L (ref 22–32)
Calcium: 9.2 mg/dL (ref 8.9–10.3)
Chloride: 104 mmol/L (ref 98–111)
Creatinine, Ser: 0.66 mg/dL (ref 0.44–1.00)
GFR, Estimated: >60 mL/min (ref >60)
Glucose, Bld: 68 mg/dL — ABNORMAL LOW (ref 70–99)
Potassium: 4.3 mmol/L (ref 3.5–5.1)
Sodium: 139 mmol/L (ref 135–145)

## 2024-05-14 LAB — DIGOXIN LEVEL: Digoxin Level: 1.7 ng/mL (ref 0.8–2.0)

## 2024-05-14 LAB — PHOSPHORUS: Phosphorus: 3.3 mg/dL (ref 2.5–4.6)

## 2024-05-14 LAB — T4, FREE: Free T4: 0.7 ng/dL (ref 0.61–1.12)

## 2024-05-14 LAB — MAGNESIUM: Magnesium: 2.1 mg/dL (ref 1.7–2.4)

## 2024-05-14 MED ORDER — QUETIAPINE FUMARATE 25 MG PO TABS
12.5000 mg | ORAL_TABLET | Freq: Every day | ORAL | Status: DC
  Administered 2024-05-15 – 2024-05-20 (×6): 12.5 mg via ORAL
  Filled 2024-05-14 (×6): qty 1

## 2024-05-14 MED ORDER — FUROSEMIDE 10 MG/ML IJ SOLN
20.0000 mg | Freq: Once | INTRAMUSCULAR | Status: AC
  Administered 2024-05-14: 20 mg via INTRAVENOUS
  Filled 2024-05-14: qty 2

## 2024-05-14 MED ORDER — DIGOXIN 0.25 MG/ML IJ SOLN
0.2500 mg | Freq: Once | INTRAMUSCULAR | Status: AC
  Administered 2024-05-14: 0.25 mg via INTRAVENOUS
  Filled 2024-05-14: qty 2

## 2024-05-14 NOTE — Telephone Encounter (Signed)
 FYI. Pt's sister called to let us  know that pt is currently in the hospital.

## 2024-05-14 NOTE — Telephone Encounter (Signed)
 Spoke with pt's sister and while verifying that it was okay to speak with her the call was dropped. I then attempted to call back and had to leave a message for her to return our call.

## 2024-05-14 NOTE — Telephone Encounter (Unsigned)
 Copied from CRM #[id number]. Topic: Clinical - Medical Advice >> May 14, 2024 12:31 PM [place] J wrote: Patient's sister Darold Clarity) calling back. She was speaking with Harlene but the call dropped. She is currently at the airport and boards a flight in the next 2 hours. She was wondering if Harlene would be able to reach her before then.

## 2024-05-14 NOTE — Progress Notes (Signed)
  Progress Note  Patient Name: Alexandria Sherman Date of Encounter: 05/14/2024 [facility] Cardiologist: Deatrice Cage, MD   Interval Summary    Patient remains in Afib with relatively controlled rates. Pressure intermittently low. Patient denies any symptoms. Patient remains on 2L O2.  Vital Signs Vitals:   05/13/24 1900 05/13/24 2000 05/14/24 0019 05/14/24 0400  BP: 103/83 (!) 107/93    Pulse: 83 (!) 47  100  Resp: (!) 27 (!) 31    Temp:   97.7 F (36.5 C) 97.8 F (36.6 C)  TempSrc:   Axillary Axillary  SpO2: 91% 98% 94% 100%  Weight:      Height:        Intake/Output Summary (Last 24 hours) at 05/14/2024 0805 Last data filed at 05/14/2024 0533 Gross per 24 hour  Intake 1200 ml  Output 1050 ml  Net 150 ml      05/10/2024    2:38 PM 04/03/2024   12:15 PM 03/31/2024    9:27 AM  Last 3 Weights  Weight (lbs) 136 lb 7.4 oz 130 lb 130 lb  Weight (kg) 61.9 kg 58.968 kg 58.968 kg      Telemetry/ECG  Afib HR 90s, PVCs - Personally Reviewed  Physical Exam  GEN: No acute distress.   Neck: No JVD Cardiac: Irreg Irreg, no murmurs, rubs, or gallops.  Respiratory: Clear to auscultation bilaterally. GI: Soft, nontender, non-distended  MS: 1+ lower leg edema  CV studies:  Echo 04/2024 1. Left ventricular ejection fraction, by estimation, is 50 to 55%. The  left ventricle has low normal function. The left ventricle has no regional  wall motion abnormalities. Left ventricular diastolic parameters are  indeterminate.   2. Right ventricular systolic function is normal. The right ventricular  size is normal.   3. The mitral valve is normal in structure. Mild mitral valve  regurgitation.   4. The aortic valve is tricuspid. Aortic valve regurgitation is not  visualized.   5. The inferior vena cava is normal in size with greater than 50%  respiratory variability, suggesting right atrial pressure of 3 mmHg.    Assessment & Plan   New onset Afib - new onset afib in  the setting of sepsis and hyperthyroidism - IV dilt stopped due to soft pressures - metoprolol  25mg  Q6H - continue Eliquis  5mg  BID - rates remain in the 90s, will likely elevated with movement - will continue digoxin  daily - need to consider TEE/DCCV if remains in Afib  Acute respiratory failure - patient denies feeling SOB, but appears tachypneic on exam - JVP elevated  Hyperthyroidism - patient recently started on methimazole  - per IM  Dilated pulmonary artery - on chest CTA - echo showed normal BiV function and normal CVP - IV lasix  20mg  daily - continue with diuresis  Sepsis 2/2 URI - abx per IM  Venous insufficiency Lymphedema and possible PAD - IV lasix  - lower extremity US  ordered    For questions or updates, please contact [facility] Please consult [email or web address] for contact info under       Signed, Fama Muenchow VEAR Fishman, PA-C

## 2024-05-14 NOTE — Evaluation (Signed)
 Physical Therapy Evaluation Patient Details Name: Alexandria Sherman MRN: [id number] DOB: Jan 09, 1946 Today's Date: 05/14/2024  History of Present Illness  Alexandria Sherman is a 78 y.o. female with medical history significant of anxiety, depression, GERD, HTN, HLD, subclinical hyperthyroidism who was brought in to [facility] ED by ambulance for generalized weakness and shortness of breath that has worsened over the last week.  Patient lives alone but she does have caregivers at home.  With EMS her heart rate was noted to have in 160s.   Clinical Impression  Patient received in recliner, multiple guests in room. Patient is agreeable to PT assessment. She reports multiple falls at home. She lives alone, but has a caregiver that comes in the am to help get her up and fed. Then another caregiver comes in the evening to get her back to bed. She sits on the sofa all day when caregivers not there. She is unable to get up without assistance. Patient required stedy and +2 assist for sit to stand transfer from recliner this pm. Requires assistance with placing feet on stedy and assistance to place arms on stedy to help pull up. She is globally very weak. Patient will continue to benefit from skilled PT while here to maximize strength, endurance and safety with mobility.            If plan is discharge home, recommend the following: Two people to help with walking and/or transfers;Two people to help with bathing/dressing/bathroom   Can travel by private vehicle   No    Equipment Recommendations Rolling walker (2 wheels);Wheelchair (measurements PT);Wheelchair cushion (measurements PT);Hoyer lift  Recommendations for [employer]       Functional Status Assessment Patient has had a recent decline in their functional status and demonstrates the ability to make significant improvements in function in a reasonable and predictable amount of time.     Precautions / Restrictions Precautions Precautions: Fall Recall  of Precautions/Restrictions: Impaired Restrictions Weight Bearing Restrictions Per Provider Order: No      Mobility  Bed Mobility               General bed mobility comments: NT patient received in recliner and wanted to stay in recliner    Transfers Overall transfer level: Needs assistance Equipment used: Ambulation equipment used Transfers: Sit to/from Stand Sit to Stand: Max assist, +2 physical assistance, Via lift equipment           General transfer comment: able to stand briefly x2 from recliner    Ambulation/Gait               General Gait Details: unable  Stairs            Wheelchair Mobility     Tilt Bed    Modified Rankin (Stroke Patients Only)       Balance Overall balance assessment: Needs assistance Sitting-balance support: Feet supported Sitting balance-Leahy Scale: Fair     Standing balance support: Bilateral upper extremity supported, During functional activity, Reliant on assistive device for balance Standing balance-Leahy Scale: Poor Standing balance comment: minimal standing tolerance < 5 seconds in stedy                             Pertinent Vitals/Pain Pain Assessment Pain Assessment: No/denies pain    Home Living Family/patient expects to be discharged to:: Private residence Living Arrangements: Alone;Other (Comment) Available Help at Discharge: Personal care attendant;Available PRN/intermittently Type of Home: House  Home Access: Level entry       Home Layout: One level Home Equipment: Agricultural consultant (2 wheels)      Prior Function Prior Level of Function : Needs assist;History of Falls (last six months)       Physical Assist : Mobility (physical);ADLs (physical)     Mobility Comments: Patient reports multiple falls, states she requires assistance with all mobility at this time. Only gets up when caregivers are present with RW ADLs Comments: Requires assistance for all ADLs      Extremity/Trunk Assessment   Upper Extremity Assessment Upper Extremity Assessment: Defer to OT evaluation    Lower Extremity Assessment Lower Extremity Assessment: Generalized weakness;RLE deficits/detail;LLE deficits/detail RLE Deficits / Details: very weak with limited DF/PF, unable to lift leg off recliner. Grossly 2/5 RLE Coordination: decreased gross motor LLE Deficits / Details: very weak with minimal PF/DF, unable to lift leg off chair LLE Coordination: decreased gross motor    Cervical / Trunk Assessment Cervical / Trunk Assessment: Kyphotic  Communication   Communication Communication: No apparent difficulties    Cognition Arousal: Alert Behavior During Therapy: WFL for tasks assessed/performed   PT - Cognitive impairments: No apparent impairments                         Following commands: Impaired Following commands impaired: Follows one step commands inconsistently, Follows one step commands with increased time     Cueing Cueing Techniques: Verbal cues, Gestural cues, Tactile cues     General Comments      Exercises     Assessment/Plan    PT Assessment Patient needs continued PT services  PT Problem List Decreased strength;Decreased range of motion;Decreased activity tolerance;Decreased balance;Decreased mobility;Decreased coordination;Decreased safety awareness;Decreased knowledge of use of DME;Cardiopulmonary status limiting activity;Decreased skin integrity       PT Treatment Interventions DME instruction;Functional mobility training;Therapeutic activities;Therapeutic exercise;Balance training;Neuromuscular re-education;Patient/family education    PT Goals (Current goals can be found in the Care Plan section)  Acute Rehab PT Goals Patient Stated Goal: return home with caregivers. PT Goal Formulation: With patient/family Time For Goal Achievement: 05/25/24 Potential to Achieve Goals: Fair    Frequency Min 2X/week      Co-evaluation               AM-PAC PT 6 Clicks Mobility  Outcome Measure Help needed turning from your back to your side while in a flat bed without using bedrails?: Total Help needed moving from lying on your back to sitting on the side of a flat bed without using bedrails?: Total Help needed moving to and from a bed to a chair (including a wheelchair)?: A Lot Help needed standing up from a chair using your arms (e.g., wheelchair or bedside chair)?: A Lot Help needed to walk in hospital room?: Total Help needed climbing 3-5 steps with a railing? : Total 6 Click Score: 8    End of Session Equipment Utilized During Treatment: Gait belt;Oxygen Activity Tolerance: Patient limited by fatigue Patient left: in chair;with call bell/phone within reach;with family/visitor present Nurse Communication: Mobility status;Need for lift equipment PT Visit Diagnosis: Other abnormalities of gait and mobility (R26.89);Muscle weakness (generalized) (M62.81);Repeated falls (R29.6);Other symptoms and signs involving the nervous system (R29.898)    Time: 1325-1401 PT Time Calculation (min) (ACUTE ONLY): 36 min   Charges:   PT Evaluation $PT Eval Moderate Complexity: 1 Mod PT Treatments $Therapeutic Activity: 8-22 mins PT General Charges $$ ACUTE PT VISIT: 1 Visit  Amna Welker, PT, GCS 05/14/24,2:21 PM

## 2024-05-14 NOTE — Progress Notes (Signed)
 [facility] Progress Note  Patient: Alexandria Sherman    FMW:[id number]  DOA: 05/10/2024     Date of Service: the patient was seen and examined on 05/14/2024  Chief Complaint  Patient presents with   Weakness   Brief hospital course: HEAVEN MEEKER is a 78 y.o. female with medical history significant of anxiety, depression, GERD, HTN, HLD, subclinical hyperthyroidism who was brought in to [facility] ED by ambulance for generalized weakness and shortness of breath that has worsened over the last week.  Patient lives alone but she does have a caregiver at home.  With EMS her heart rate was noted to have in 160s.  Patient tells me that she has a bilateral lower extremity swelling and chronic skin changes but does not carry the diagnosis of atrial fibrillation or congestive heart failure.  She also complains some dysuria but no fever no chills.  She denies any chest pain or palpitations.  She denies any diarrhea or vomiting.  Her main complaint was she was not able to walk around at her home.   In the ED, patient was found to be in atrial fibrillation with rapid ventricular response, new onset and also patient has positive urine analysis.  She also has a low TSH level which appears to be chronic in nature.  Patient was started on Cardizem  drip in the emergency room and was started on antibiotic.  Patient has a history of blisters with the use of cephalosporins in her vaginal area.   Hospitalist service was consulted for evaluation for admission for sepsis due to urinary tract infection and atrial fibrillation with rapid ventricular response.   Assessment and Plan:  # Acute hypoxic respiratory failure with pulmonary edema Improving  In the setting of afib with RVR. Continue supplemental O2 inhalation and gradually wean off 8/18 s/p Lasix  40 mg x 1 dose given 8/19 Lasix  20 mg IV daily 8/20 IV lasix  20mg  BID  Continue IV diuresis per cardiology   # Sepsis due to urinary tract infection Sepsis  physiology now resolved  Sepsis criteria, tachycardia, tachypneic and leukocytosis with UTI She is allergic to cephalosporin which causes blisters in her vaginal area. S/p Aztreonam , urine culture grew E. coli.  As per sensitivity patient was transition to Bactrimx 3 days Blood culture NGTD Continue bactrim    # Atrial fibrillation w/RVR new onset, POA In the setting of sepsis and hyperthyroidism  CHADS2 DS 2 vasc score 4 (Age 64+sex 1+ HTN 1) S/p Cardizem  IV infusion, weaned off S/p Heparin  IV gtt, transition to Eliquis  on 8/18 8/18 s/p digoxin  0.25 mg IV x 2 doses  8/19 changed Lopressor  25 mg p.o. q6 Continue to monitor on telemetry TTE LVEF 50 to 55%, no WMA, no significant valvular pathology  Per cardiology  Digoxin , metop tartrate, eliquis    # Hypethyroidism Undiagnosed previously, patient is poor historian, she is aware that her thyroid  function was abnl, does not remember taking any medications FT4 1.33, FT3 WNL, TSH 0.027  8/17 started methimazole  15 mg p.o. TID x 2 doses, and BID x 2 doses, followed by daily  8/19 Free T4 level 0.81 gradually decreasing  thyroid  antibodies Neg Thyroid  sonogram: Right thyroid  nodule, needs monitoring Endocrinology outpatient referral  Trend free T4 daily Continue methimazole   #Chronic venous insufficiency  #Lymphedema #Possible PAD Concern for possible overlying fungal infection.  Continue Lotrisone  twice daily RN was advised to clean with soap water before each application Keep lower extremities elevated while in bed Continue Lasix  as above -F/u  LE arterial duplex study    # Anxiety/depression - Continue her home medications     # Hypokalemia resolved   # Sundowning Continue Seroquel   Glaucoma, continued eyedrops   Body mass index is 23.42 kg/m.  Interventions:  Diet: Heart healthy/carb modified diet DVT Prophylaxis: Eliquis   Advance goals of care discussion: Full code  Family Communication: Sister and  brother-in-law at bedside  Disposition:  Pt is from Home, admitted with sepsis due to UTI and A-fib with RVR, still has A. fib with RVR which precludes a safe discharge. Discharge to home, when stable, may need few days to improve.  Subjective: No acute events overnight.  No current complaints.   Physical Exam:  Constitutional: In no distress.  Cardiovascular: No JVD.  Irregularly irregular. No lower extremity edema  Pulmonary: Non labored breathing on nasal cannula, no wheezing.  No rales. Abdominal: Soft. Normal bowel sounds. Non distended and non tender Musculoskeletal:   Neurological: Alert and oriented to person, place, and time. Non focal  Skin: Skin is warm and dry.  Bilateral lower extremities hyperpigmentation   Vitals:   05/14/24 0900 05/14/24 1000 05/14/24 1200 05/14/24 1644  BP: (!) 106/96 102/75    Pulse: 80   85  Resp: 20 (!) 28 20   Temp:    98.2 F (36.8 C)  TempSrc:    Oral  SpO2: 90%     Weight:      Height:        Intake/Output Summary (Last 24 hours) at 05/14/2024 1710 Last data filed at 05/14/2024 1000 Gross per 24 hour  Intake 720 ml  Output 950 ml  Net -230 ml   Filed Weights   05/10/24 1438  Weight: 61.9 kg    Data Reviewed: I personally reviewed telemetry  CBC: Recent Labs  Lab 05/11/24 0225 05/12/24 0310 05/13/24 0020 05/13/24 0351 05/14/24 0348  WBC 11.5* 13.3* 13.2* 12.1* 12.3*  NEUTROABS  --   --  10.5*  --   --   HGB 12.4 12.7 12.9 12.7 12.8  HCT 37.9 41.1 40.5 41.3 42.2  MCV 94.5 98.3 97.1 99.3 101.7*  PLT 347 329 298 283 246   Basic Metabolic Panel: Recent Labs  Lab 05/10/24 1002 05/11/24 0225 05/12/24 0310 05/13/24 0020 05/13/24 0351 05/14/24 0348  NA 139 141 140 138 138 139  K 3.0* 3.9 4.3 4.1 4.1 4.3  CL 98 105 107 103 102 104  CO2 28 27 25 27 28 27   GLUCOSE 167* 126* 121* 121* 97 68*  BUN 39* 38* 31* 31* 32* 33*  CREATININE 0.58 0.43* 0.49 0.46 0.58 0.66  CALCIUM  9.7 8.9 8.9 9.3 9.2 9.2  MG 2.3 2.1 2.1   --  2.1 2.1  PHOS  --  2.9 4.2  --  4.2 3.3    Studies: No results found.   Scheduled Meds:  apixaban   5 mg Oral BID   Chlorhexidine  Gluconate Cloth  6 each Topical QHS   clotrimazole -betamethasone   1 Application Topical BID   divalproex   125 mg Oral BID   feeding supplement  237 mL Oral BID BM   furosemide   20 mg Intravenous Daily   furosemide   20 mg Intravenous Once   latanoprost   1 drop Both Eyes QHS   methIMAzole   15 mg Oral Daily   metoprolol  tartrate  25 mg Oral Q6H   pantoprazole   40 mg Oral Daily   QUEtiapine   25 mg Oral QPC supper   sulfamethoxazole -trimethoprim   1 tablet Oral Q12H  timolol   1 drop Both Eyes Daily   venlafaxine   75 mg Oral BID   Continuous Infusions:   PRN Meds: acetaminophen  **OR** acetaminophen , clorazepate , ipratropium-albuterol , nystatin , ondansetron  **OR** ondansetron  (ZOFRAN ) IV, polyethylene glycol  Time spent: 35 minutes  Author: Alban Pepper. MD [facility] 05/14/2024 5:10 PM  To reach On-call, see care teams to locate the attending and reach out to them via [email or web address]. If 7PM-7AM, please contact night-coverage If you still have difficulty reaching the attending provider, please page the [facility] (Director on Call) for [facility] on amion for assistance.

## 2024-05-14 NOTE — Plan of Care (Signed)
   Problem: Coping: Goal: Level of anxiety will decrease Outcome: Progressing

## 2024-05-14 NOTE — Telephone Encounter (Signed)
 Copied from CRM #[id number]. Topic: Clinical - Medical Advice >> May 14, 2024 10:32 AM Charolett L wrote: Reason for CRM: patient sister is currently in the hospital for 5 days and wanted to let the doctor know Cb# [phone]

## 2024-05-14 NOTE — Telephone Encounter (Signed)
 Attempted to call pt's sister again and the call was dropped again.

## 2024-05-14 NOTE — Telephone Encounter (Unsigned)
 Copied from CRM #[id number]. Topic: Clinical - Medical Advice >> May 14, 2024 11:07 AM Franky GRADE wrote: Patient's sister is calling to speak with Dr.Tullo regarding the patient who is currently admitted. She is going back home but wants to speak with Dr.Tullo before 2pm.

## 2024-05-15 DIAGNOSIS — I5031 Acute diastolic (congestive) heart failure: Secondary | ICD-10-CM

## 2024-05-15 DIAGNOSIS — R531 Weakness: Secondary | ICD-10-CM | POA: Diagnosis not present

## 2024-05-15 DIAGNOSIS — I4891 Unspecified atrial fibrillation: Secondary | ICD-10-CM | POA: Diagnosis not present

## 2024-05-15 DIAGNOSIS — R0602 Shortness of breath: Secondary | ICD-10-CM | POA: Diagnosis not present

## 2024-05-15 DIAGNOSIS — N390 Urinary tract infection, site not specified: Secondary | ICD-10-CM | POA: Diagnosis not present

## 2024-05-15 LAB — BASIC METABOLIC PANEL WITH GFR
Anion gap: 5 (ref 5–15)
BUN: 25 mg/dL — ABNORMAL HIGH (ref 8–23)
CO2: 36 mmol/L — ABNORMAL HIGH (ref 22–32)
Calcium: 9.3 mg/dL (ref 8.9–10.3)
Chloride: 101 mmol/L (ref 98–111)
Creatinine, Ser: 0.52 mg/dL (ref 0.44–1.00)
GFR, Estimated: >60 mL/min (ref >60)
Glucose, Bld: 82 mg/dL (ref 70–99)
Potassium: 4 mmol/L (ref 3.5–5.1)
Sodium: 142 mmol/L (ref 135–145)

## 2024-05-15 LAB — CBC WITH DIFFERENTIAL/PLATELET
Abs Immature Granulocytes: 0.1 K/uL — ABNORMAL HIGH (ref 0.00–0.07)
Basophils Absolute: 0 K/uL (ref 0.0–0.1)
Basophils Relative: 0 %
Eosinophils Absolute: 0.1 K/uL (ref 0.0–0.5)
Eosinophils Relative: 1 %
HCT: 44.3 % (ref 36.0–46.0)
Hemoglobin: 13.3 g/dL (ref 12.0–15.0)
Immature Granulocytes: 1 %
Lymphocytes Relative: 9 %
Lymphs Abs: 1.1 K/uL (ref 0.7–4.0)
MCH: 30.2 pg (ref 26.0–34.0)
MCHC: 30 g/dL (ref 30.0–36.0)
MCV: 100.7 fL — ABNORMAL HIGH (ref 80.0–100.0)
Monocytes Absolute: 0.8 K/uL (ref 0.1–1.0)
Monocytes Relative: 7 %
Neutro Abs: 10.2 K/uL — ABNORMAL HIGH (ref 1.7–7.7)
Neutrophils Relative %: 82 %
Platelets: 273 K/uL (ref 150–400)
RBC: 4.4 MIL/uL (ref 3.87–5.11)
RDW: 14.4 % (ref 11.5–15.5)
WBC: 12.4 K/uL — ABNORMAL HIGH (ref 4.0–10.5)
nRBC: 0 % (ref 0.0–0.2)

## 2024-05-15 LAB — CBC
HCT: 44.1 % (ref 36.0–46.0)
Hemoglobin: 13.3 g/dL (ref 12.0–15.0)
MCH: 30.4 pg (ref 26.0–34.0)
MCHC: 30.2 g/dL (ref 30.0–36.0)
MCV: 100.9 fL — ABNORMAL HIGH (ref 80.0–100.0)
Platelets: 267 K/uL (ref 150–400)
RBC: 4.37 MIL/uL (ref 3.87–5.11)
RDW: 14.3 % (ref 11.5–15.5)
WBC: 12.4 K/uL — ABNORMAL HIGH (ref 4.0–10.5)
nRBC: 0 % (ref 0.0–0.2)

## 2024-05-15 LAB — T4, FREE: Free T4: 0.62 ng/dL (ref 0.61–1.12)

## 2024-05-15 LAB — CULTURE, BLOOD (ROUTINE X 2)
Culture: NO GROWTH
Culture: NO GROWTH

## 2024-05-15 LAB — BRAIN NATRIURETIC PEPTIDE: B Natriuretic Peptide: 611 pg/mL — ABNORMAL HIGH (ref 0.0–100.0)

## 2024-05-15 LAB — MAGNESIUM: Magnesium: 2.1 mg/dL (ref 1.7–2.4)

## 2024-05-15 MED ORDER — METOPROLOL SUCCINATE ER 50 MG PO TB24
50.0000 mg | ORAL_TABLET | Freq: Two times a day (BID) | ORAL | Status: DC
  Administered 2024-05-15: 50 mg via ORAL
  Filled 2024-05-15: qty 1

## 2024-05-15 MED ORDER — DIGOXIN 125 MCG PO TABS
0.0625 mg | ORAL_TABLET | Freq: Every day | ORAL | Status: DC
  Administered 2024-05-15 – 2024-05-21 (×7): 0.0625 mg via ORAL
  Filled 2024-05-15 (×7): qty 0.5

## 2024-05-15 NOTE — Plan of Care (Signed)
   Problem: Coping: Goal: Level of anxiety will decrease Outcome: Progressing

## 2024-05-15 NOTE — Progress Notes (Signed)
  Progress Note  Patient Name: Alexandria Sherman Date of Encounter: 05/15/2024 [facility] Cardiologist: Deatrice Cage, MD   Interval Summary     UOP -1.4L. breathing is better. Afib rates in the 80s.  Vital Signs Vitals:   05/14/24 1951 05/14/24 2023 05/14/24 2343 05/15/24 0347  BP: 122/86  104/75 115/79  Pulse: 89  86 70  Resp:  (!) 22 20 20   Temp: 98.5 F (36.9 C)  98.4 F (36.9 C) 97.8 F (36.6 C)  TempSrc:      SpO2: 98%  100% 98%  Weight:      Height:        Intake/Output Summary (Last 24 hours) at 05/15/2024 0837 Last data filed at 05/15/2024 0500 Gross per 24 hour  Intake 240 ml  Output 1400 ml  Net -1160 ml      05/10/2024    2:38 PM 04/03/2024   12:15 PM 03/31/2024    9:27 AM  Last 3 Weights  Weight (lbs) 136 lb 7.4 oz 130 lb 130 lb  Weight (kg) 61.9 kg 58.968 kg 58.968 kg      Telemetry/ECG  Afib 80-90s - Personally Reviewed  Physical Exam  GEN: No acute distress.   Neck: No JVD Cardiac: Irreg Irreg, no murmurs, rubs, or gallops.  Respiratory: Clear to auscultation bilaterally. GI: Soft, nontender, non-distended  MS: No edema   CV studies:   Echo 04/2024 1. Left ventricular ejection fraction, by estimation, is 50 to 55%. The  left ventricle has low normal function. The left ventricle has no regional  wall motion abnormalities. Left ventricular diastolic parameters are  indeterminate.   2. Right ventricular systolic function is normal. The right ventricular  size is normal.   3. The mitral valve is normal in structure. Mild mitral valve  regurgitation.   4. The aortic valve is tricuspid. Aortic valve regurgitation is not  visualized.   5. The inferior vena cava is normal in size with greater than 50%  respiratory variability, suggesting right atrial pressure of 3 mmHg.      Assessment & Plan    New onset Afib - new onset afib in the setting of sepsis and hyperthyroidism - IV dilt stopped due to soft pressures - metoprolol  25mg   Q6H>>change to lopressor  50mg  BID - continue Eliquis  5mg  BID - rates remain controlled - will continue low dose digoxin  daily - need to consider TEE/DCCV if remains in Afib   Acute respiratory failure - improving   Hyperthyroidism - patient recently started on methimazole  - per IM   Dilated pulmonary artery - on chest CTA - echo showed normal BiV function and normal CVP - IV lasix  20mg  daily - continue with diuresis   Sepsis 2/2 URI - abx per IM   Venous insufficiency Lymphedema and possible PAD - IV lasix  - lower extremity US  negative   For questions or updates, please contact [facility] Please consult [email or web address] for contact info under       Signed, Sheralee Qazi VEAR Fishman, PA-C

## 2024-05-15 NOTE — Progress Notes (Signed)
 Physical Therapy Treatment Patient Details Name: Alexandria Sherman MRN: [id number] DOB: [birth year] Today's Date: 05/15/2024   History of Present Illness Alexandria Sherman is a 78 y.o. female with medical history significant of anxiety, depression, GERD, HTN, HLD, subclinical hyperthyroidism who was brought in to [facility] ED by ambulance for generalized weakness and shortness of breath that has worsened over the last week.  Patient lives alone but she does have caregivers at home.  With EMS her heart rate was noted to have in 160s.    PT Comments  Patient received in bed, she is agreeable to PT/OT session. Patient required mod A for bed mobility. She is able to sit unsupported at edge of bed. She requires mod +2 A for sit to stand and to pivot to recliner. Max cues and set up assist needed. Patient will continue to benefit from skilled PT to improve functional independence and strength.     If plan is discharge home, recommend the following: Two people to help with walking and/or transfers;Two people to help with bathing/dressing/bathroom   Can travel by private vehicle     No  Equipment Recommendations  Wheelchair (measurements PT);Wheelchair cushion (measurements PT);Hoyer lift;Other (comment) (patient states she would need a narrow wheelchair as her home is small)    Recommendations for Other Services       Precautions / Restrictions Precautions Precautions: Fall Recall of Precautions/Restrictions: Impaired Restrictions Weight Bearing Restrictions Per Provider Order: No     Mobility  Bed Mobility Overal bed mobility: Needs Assistance Bed Mobility: Supine to Sit     Supine to sit: Mod assist, HOB elevated, Used rails     General bed mobility comments: Assistance to bring legs off bed and to get pivoted around to edge of bed. Assist to scoot forward and position feet in preparation for standing.    Transfers Overall transfer level: Needs assistance Equipment used: Rolling walker (2  wheels) Transfers: Sit to/from Stand, Bed to chair/wheelchair/BSC Sit to Stand: Max assist, +2 physical assistance Stand pivot transfers: Mod assist, +2 physical assistance         General transfer comment: Max assist to power up to standing from bed. Mod +2 assist to pivot to recliner safely with max cues.    Ambulation/Gait               General Gait Details: unable   Stairs             Wheelchair Mobility     Tilt Bed    Modified Rankin (Stroke Patients Only)       Balance Overall balance assessment: Needs assistance Sitting-balance support: Feet supported Sitting balance-Leahy Scale: Fair     Standing balance support: Bilateral upper extremity supported, During functional activity, Reliant on assistive device for balance Standing balance-Leahy Scale: Poor Standing balance comment: Poor standing tolerance and poor balance. Requires +2 assist for standing and transfer                            Communication Communication Communication: No apparent difficulties  Cognition Arousal: Alert Behavior During Therapy: WFL for tasks assessed/performed   PT - Cognitive impairments: No apparent impairments                         Following commands: Impaired Following commands impaired: Follows one step commands inconsistently, Follows one step commands with increased time    Cueing Cueing Techniques: Verbal cues,  Gestural cues, Tactile cues  Exercises      General Comments        Pertinent Vitals/Pain Pain Assessment Pain Assessment: No/denies pain    Home Living                          Prior Function            PT Goals (current goals can now be found in the care plan section) Acute Rehab PT Goals Patient Stated Goal: return home with caregivers. PT Goal Formulation: With patient/family Time For Goal Achievement: 05/25/24 Potential to Achieve Goals: Fair Progress towards PT goals: Progressing toward  goals    Frequency    Min 2X/week      PT Plan      Co-evaluation PT/OT/SLP Co-Evaluation/Treatment: Yes Reason for Co-Treatment: To address functional/ADL transfers;For patient/therapist safety PT goals addressed during session: Mobility/safety with mobility;Balance;Proper use of DME        AM-PAC PT 6 Clicks Mobility   Outcome Measure  Help needed turning from your back to your side while in a flat bed without using bedrails?: A Lot Help needed moving from lying on your back to sitting on the side of a flat bed without using bedrails?: A Lot Help needed moving to and from a bed to a chair (including a wheelchair)?: A Lot Help needed standing up from a chair using your arms (e.g., wheelchair or bedside chair)?: A Lot Help needed to walk in hospital room?: Total Help needed climbing 3-5 steps with a railing? : Total 6 Click Score: 10    End of Session Equipment Utilized During Treatment: Gait belt;Oxygen Activity Tolerance: Patient limited by fatigue Patient left: in chair;with call bell/phone within reach Nurse Communication: Mobility status;Need for lift equipment PT Visit Diagnosis: Other abnormalities of gait and mobility (R26.89);Muscle weakness (generalized) (M62.81);Repeated falls (R29.6);Other symptoms and signs involving the nervous system (R29.898)     Time: 8879-8866 PT Time Calculation (min) (ACUTE ONLY): 13 min  Charges:    $Therapeutic Activity: 8-22 mins PT General Charges $$ ACUTE PT VISIT: 1 Visit                     Geraldina Parrott, PT, GCS 05/15/24,11:43 AM

## 2024-05-15 NOTE — Evaluation (Signed)
 Occupational Therapy Evaluation Patient Details Name: Alexandria Sherman MRN: [id number] DOB: 1946/05/04 Today's Date: 05/15/2024   History of Present Illness   Alexandria Sherman is a 78 y.o. female with medical history significant of anxiety, depression, GERD, HTN, HLD, subclinical hyperthyroidism who was brought in to [facility] ED by ambulance for generalized weakness and shortness of breath that has worsened over the last week.  With EMS her heart rate was noted to be 160s.    Clinical Impressions Alexandria Sherman was seen for OT evaluation this date. Prior to hospital admission, pt dependent for chair t/fs and ADLs. Lives alone with caregiver 2x/daily. Pt presents to acute OT demonstrating impaired ADL performance and functional mobility 2/2 decreased activity tolerance and functional strength/ROM/balance deficits. Pt currently requires MOD A x2 exit bed. MAX A x2 + RW sit<>stand, MOD A x2 step pivot t/f bed>chair. Pt would benefit from skilled OT to address noted impairments and functional limitations (see below for any additional details). Upon hospital discharge, recommend OT follow up <3 hours/day.     If plan is discharge home, recommend the following:   Two people to help with walking and/or transfers;Two people to help with bathing/dressing/bathroom;Help with stairs or ramp for entrance     Functional Status Assessment   Patient has had a recent decline in their functional status and demonstrates the ability to make significant improvements in function in a reasonable and predictable amount of time.     Equipment Recommendations   [employer];Hospital bed;BSC/3in1     Recommendations for Other Services         Precautions/Restrictions   Precautions Precautions: Fall Recall of Precautions/Restrictions: Impaired Restrictions Weight Bearing Restrictions Per Provider Order: No     Mobility Bed Mobility Overal bed mobility: Needs Assistance Bed Mobility: Supine to Sit      Supine to sit: Mod assist, HOB elevated, Used rails, +2 assist          Transfers Overall transfer level: Needs assistance Equipment used: Rolling walker (2 wheels) Transfers: Sit to/from Stand, Bed to chair/wheelchair/BSC Sit to Stand: Max assist, +2 physical assistance Stand pivot transfers: Mod assist, +2 physical assistance                Balance Overall balance assessment: Needs assistance Sitting-balance support: Feet supported Sitting balance-Leahy Scale: Fair     Standing balance support: Bilateral upper extremity supported, During functional activity, Reliant on assistive device for balance Standing balance-Leahy Scale: Poor                             ADL either performed or assessed with clinical judgement   ADL Overall ADL's : Needs assistance/impaired                                       General ADL Comments: MAX A don B socks in sitting.     Vision         Perception         Praxis         Pertinent Vitals/Pain Pain Assessment Pain Assessment: No/denies pain     Extremity/Trunk Assessment             Communication Communication Communication: No apparent difficulties   Cognition Arousal: Alert Behavior During Therapy: WFL for tasks assessed/performed Cognition: No family/caregiver present to determine baseline  Following commands: Impaired Following commands impaired: Follows one step commands with increased time     Cueing  General Comments   Cueing Techniques: Verbal cues;Gestural cues;Tactile cues      Exercises     Shoulder Instructions      Home Living Family/patient expects to be discharged to:: Private residence Living Arrangements: Alone Available Help at Discharge: Personal care attendant;Available PRN/intermittently Type of Home: House Home Access: Level entry     Home Layout: One level     Bathroom Shower/Tub: Walk-in shower          Home Equipment: Agricultural consultant (2 wheels)          Prior Functioning/Environment Prior Level of Function : Needs assist;History of Falls (last six months)             Mobility Comments: Patient reports multiple falls, dependent x1 pivot t/f at baseline ADLs Comments: Requires assistance for all ADLs    OT Problem List: Decreased strength;Decreased range of motion;Decreased activity tolerance;Impaired balance (sitting and/or standing)   OT Treatment/Interventions: Self-care/ADL training;Therapeutic exercise;Energy conservation;DME and/or AE instruction;Therapeutic activities;Patient/family education      OT Goals(Current goals can be found in the care plan section)   Acute Rehab OT Goals Patient Stated Goal: to go home OT Goal Formulation: With patient Time For Goal Achievement: 05/29/24 Potential to Achieve Goals: Fair ADL Goals Pt Will Perform Grooming: with supervision;with set-up;sitting Pt Will Perform Lower Body Dressing: with mod assist;sitting/lateral leans Pt Will Transfer to Toilet: with max assist;bedside commode   OT Frequency:  Min 2X/week    Co-evaluation PT/OT/SLP Co-Evaluation/Treatment: Yes Reason for Co-Treatment: To address functional/ADL transfers;For patient/therapist safety PT goals addressed during session: Mobility/safety with mobility;Balance;Proper use of DME OT goals addressed during session: ADL's and self-care      AM-PAC OT 6 Clicks Daily Activity     Outcome Measure Help from another person eating meals?: None Help from another person taking care of personal grooming?: A Little Help from another person toileting, which includes using toliet, bedpan, or urinal?: A Lot Help from another person bathing (including washing, rinsing, drying)?: A Lot Help from another person to put on and taking off regular upper body clothing?: A Lot Help from another person to put on and taking off regular lower body clothing?: A Lot 6 Click  Score: 15   End of Session    Activity Tolerance: Patient tolerated treatment well Patient left: in chair;with call bell/phone within reach  OT Visit Diagnosis: Other abnormalities of gait and mobility (R26.89);Muscle weakness (generalized) (M62.81)                Time: 8880-8868 OT Time Calculation (min): 12 min Charges:  OT General Charges $OT Visit: 1 Visit OT Evaluation $OT Eval Low Complexity: 1 Low  Elston Slot, M.S. OTR/L  05/15/24, 1:27 PM  ascom [phone]

## 2024-05-15 NOTE — Progress Notes (Signed)
 [facility] Progress Note  Patient: Alexandria Sherman    FMW:[id number]  DOA: 05/10/2024     Date of Service: the patient was seen and examined on 05/15/2024  Chief Complaint  Patient presents with   Weakness   Brief hospital course: Alexandria GRONEWOLD is a 78 y.o. female with medical history significant of anxiety, depression, GERD, HTN, HLD, subclinical hyperthyroidism who was brought in to [facility] ED by ambulance for generalized weakness and shortness of breath that has worsened over the last week.  Patient lives alone but she does have a caregiver at home.  With EMS her heart rate was noted to have in 160s.  Patient tells me that she has a bilateral lower extremity swelling and chronic skin changes but does not carry the diagnosis of atrial fibrillation or congestive heart failure.  She also complains some dysuria but no fever no chills.  She denies any chest pain or palpitations.  She denies any diarrhea or vomiting.  Her main complaint was she was not able to walk around at her home.   In the ED, patient was found to be in atrial fibrillation with rapid ventricular response, new onset and also patient has positive urine analysis.  She also has a low TSH level which appears to be chronic in nature.  Patient was started on Cardizem  drip in the emergency room and was started on antibiotic.  Patient has a history of blisters with the use of cephalosporins in her vaginal area.   Hospitalist service was consulted for evaluation for admission for sepsis due to urinary tract infection and atrial fibrillation with rapid ventricular response.   Assessment and Plan:  # Acute hypoxic respiratory failure with pulmonary edema Improving  In the setting of afib with RVR. Continue supplemental O2 inhalation and gradually wean off 8/18 s/p Lasix  40 mg x 1 dose given 8/19 Lasix  20 mg IV daily 8/20 IV lasix  20mg  BID  Continue IV diuresis per cardiology   # Sepsis due to urinary tract infection Sepsis  physiology now resolved  Sepsis criteria POA: tachycardia, tachypneic and leukocytosis with UTI She is allergic to cephalosporin which causes blisters in her vaginal area. S/p Aztreonam , urine culture grew E. coli.  Transitioned to Bactrim  s/p 3 days Continues to have mild leukocytosis  Blood culture NGTD Monitor fever curve and WBC count   # Atrial fibrillation w/RVR new onset, POA HR  improved. In the setting of sepsis and hyperthyroidism  CHADS2 DS 2 vasc score 4 (Age 78+secsex 1+ HTN 1) S/p Cardizem  IV infusion, weaned off S/p Heparin  IV gtt, transition to Eliquis  on 8/18 8/18 s/p digoxin  0.25 mg IV x 2 doses  8/19 changed Lopressor  25 mg p.o. q6 Continue to monitor on telemetry TTE LVEF 50 to 55%, no WMA, no significant valvular pathology  Per cardiology  Digoxin , metop tartrate, eliquis    # Hypethyroidism Undiagnosed previously, patient is poor historian, she is aware that her thyroid  function was abnl, does not remember taking any medications FT4 1.33, FT3 WNL, TSH 0.027  8/17 started methimazole  15 mg p.o. TID x 2 doses, and BID x 2 doses, followed by daily  8/19 Free T4 level 0.81 gradually decreasing  thyroid  antibodies Neg Thyroid  sonogram: Right thyroid  nodule, needs monitoring Endocrinology outpatient referral  Trend free T4 daily Continue methimazole   #Chronic venous insufficiency  #Lymphedema #Possible PAD Concern for possible overlying fungal infection.  Continue Lotrisone  twice daily Keep lower extremities elevated while in bed Continue Lasix  as above Arterial duplex  studies no significant blockages    # Anxiety/depression - Continue her home medications     # Hypokalemia resolved   # Sundowning Continue Seroquel   Glaucoma, continued eyedrops   Body mass index is 23.42 kg/m.  Interventions:  Diet: Heart healthy/carb modified diet DVT Prophylaxis: Eliquis   Advance goals of care discussion: Full code  Family Communication: None at  bedside  Disposition:  Pt is from Home, admitted with sepsis due to UTI and A-fib with RVR, continues to require management of A-fib.  Discharge to home, when stable, may need few days to improve.  Subjective: No complaints this a.m.   Physical Exam:    Constitutional: In no distress.  Cardiovascular: Irregularly irregular.  Trace bilateral lower extremity edema  Pulmonary: Non labored breathing on room air, no wheezing or rales.  Abdominal: Soft.Non distended and non tender Musculoskeletal: Normal range of motion.     Neurological: Alert and oriented to person, place, and time. Non focal  Skin: Skin is warm and dry. Bilateral lower extremities hyperpigmented, purplish color. Feet WTT    Vitals:   05/14/24 1951 05/14/24 2023 05/14/24 2343 05/15/24 0347  BP: 122/86  104/75 115/79  Pulse: 89  86 70  Resp:  (!) 22 20 20   Temp: 98.5 F (36.9 C)  98.4 F (36.9 C) 97.8 F (36.6 C)  TempSrc:      SpO2: 98%  100% 98%  Weight:      Height:        Intake/Output Summary (Last 24 hours) at 05/15/2024 0920 Last data filed at 05/15/2024 0500 Gross per 24 hour  Intake --  Output 1400 ml  Net -1400 ml   Filed Weights   05/10/24 1438  Weight: 61.9 kg    Data Reviewed: I personally reviewed telemetry  CBC: Recent Labs  Lab 05/12/24 0310 05/13/24 0020 05/13/24 0351 05/14/24 0348 05/15/24 0357  WBC 13.3* 13.2* 12.1* 12.3* 12.4*  12.4*  NEUTROABS  --  10.5*  --   --  10.2*  HGB 12.7 12.9 12.7 12.8 13.3  13.3  HCT 41.1 40.5 41.3 42.2 44.3  44.1  MCV 98.3 97.1 99.3 101.7* 100.7*  100.9*  PLT 329 298 283 246 273  267   Basic Metabolic Panel: Recent Labs  Lab 05/11/24 0225 05/12/24 0310 05/13/24 0020 05/13/24 0351 05/14/24 0348 05/15/24 0357  NA 141 140 138 138 139 142  K 3.9 4.3 4.1 4.1 4.3 4.0  CL 105 107 103 102 104 101  CO2 27 25 27 28 27  36*  GLUCOSE 126* 121* 121* 97 68* 82  BUN 38* 31* 31* 32* 33* 25*  CREATININE 0.43* 0.49 0.46 0.58 0.66 0.52   CALCIUM  8.9 8.9 9.3 9.2 9.2 9.3  MG 2.1 2.1  --  2.1 2.1 2.1  PHOS 2.9 4.2  --  4.2 3.3  --     Studies: US  ARTERIAL LOWER EXTREMITY DUPLEX BILATERAL Result Date: 05/15/2024 CLINICAL DATA:  Hypertension, hyperlipidemia and suspected lower extremity peripheral vascular disease. EXAM: BILATERAL LOWER EXTREMITY ARTERIAL DUPLEX SCAN TECHNIQUE: Gray-scale sonography as well as color Doppler and duplex ultrasound was performed to evaluate the arteries of both lower extremities including the common, superficial and profunda femoral arteries, popliteal artery and calf arteries. COMPARISON:  None Available. FINDINGS: Right Lower Extremity Inflow: Normal common femoral arterial waveforms and velocities. No evidence of inflow (aortoiliac) disease. Outflow: Normal profunda femoral, superficial femoral and popliteal arterial waveforms and velocities. No focal elevation of the PSV to suggest stenosis. Mild calcified plaque  in the right SFA. Runoff: Triphasic posterior tibial and biphasic anterior tibial waveforms with normal velocities. Left Lower Extremity Inflow: Normal common femoral arterial waveforms and velocities. No evidence of inflow (aortoiliac) disease. Outflow: Normal profunda femoral, superficial femoral and popliteal arterial waveforms and velocities. No focal elevation of the PSV to suggest stenosis. Mild calcified plaque in the left SFA. Runoff: Biphasic posterior tibial and triphasic anterior tibial waveforms with normal velocities. IMPRESSION: No evidence of significant arterial occlusive disease in either lower extremity. Mild calcified plaque in bilateral superficial femoral arteries. Electronically Signed   By: Marcey Moan M.D.   On: 05/15/2024 08:31     Scheduled Meds:  apixaban   5 mg Oral BID   Chlorhexidine  Gluconate Cloth  6 each Topical QHS   clotrimazole -betamethasone   1 Application Topical BID   digoxin   0.0625 mg Oral Daily   divalproex   125 mg Oral BID   feeding supplement  237  mL Oral BID BM   furosemide   20 mg Intravenous Daily   latanoprost   1 drop Both Eyes QHS   methIMAzole   15 mg Oral Daily   metoprolol  succinate  50 mg Oral BID   metoprolol  tartrate  25 mg Oral Q6H   pantoprazole   40 mg Oral Daily   QUEtiapine   12.5 mg Oral QPC supper   timolol   1 drop Both Eyes Daily   venlafaxine   75 mg Oral BID   Continuous Infusions:   PRN Meds: acetaminophen  **OR** acetaminophen , clorazepate , ipratropium-albuterol , nystatin , ondansetron  **OR** ondansetron  (ZOFRAN ) IV, polyethylene glycol  Time spent: 35 minutes  Author: Alban Pepper. MD [facility] 05/15/2024 9:20 AM  To reach On-call, see care teams to locate the attending and reach out to them via [email or web address]. If 7PM-7AM, please contact night-coverage If you still have difficulty reaching the attending provider, please page the [facility] (Director on Call) for [facility] on amion for assistance.

## 2024-05-15 NOTE — TOC CM/SW Note (Signed)
 No family at bedside. Left voicemail for sister. Will discuss SNF recommendation when she calls back.  Lauraine Carpen, CSW [phone]

## 2024-05-16 DIAGNOSIS — I4891 Unspecified atrial fibrillation: Secondary | ICD-10-CM | POA: Diagnosis not present

## 2024-05-16 DIAGNOSIS — J9600 Acute respiratory failure, unspecified whether with hypoxia or hypercapnia: Secondary | ICD-10-CM

## 2024-05-16 DIAGNOSIS — N390 Urinary tract infection, site not specified: Secondary | ICD-10-CM | POA: Diagnosis not present

## 2024-05-16 LAB — BASIC METABOLIC PANEL WITH GFR
Anion gap: 8 (ref 5–15)
BUN: 19 mg/dL (ref 8–23)
CO2: 36 mmol/L — ABNORMAL HIGH (ref 22–32)
Calcium: 9.3 mg/dL (ref 8.9–10.3)
Chloride: 97 mmol/L — ABNORMAL LOW (ref 98–111)
Creatinine, Ser: 0.55 mg/dL (ref 0.44–1.00)
GFR, Estimated: >60 mL/min (ref >60)
Glucose, Bld: 85 mg/dL (ref 70–99)
Potassium: 4.1 mmol/L (ref 3.5–5.1)
Sodium: 141 mmol/L (ref 135–145)

## 2024-05-16 LAB — MAGNESIUM: Magnesium: 2.1 mg/dL (ref 1.7–2.4)

## 2024-05-16 LAB — CBC
HCT: 42.3 % (ref 36.0–46.0)
Hemoglobin: 12.9 g/dL (ref 12.0–15.0)
MCH: 31.1 pg (ref 26.0–34.0)
MCHC: 30.5 g/dL (ref 30.0–36.0)
MCV: 101.9 fL — ABNORMAL HIGH (ref 80.0–100.0)
Platelets: 261 K/uL (ref 150–400)
RBC: 4.15 MIL/uL (ref 3.87–5.11)
RDW: 14.2 % (ref 11.5–15.5)
WBC: 12.3 K/uL — ABNORMAL HIGH (ref 4.0–10.5)
nRBC: 0 % (ref 0.0–0.2)

## 2024-05-16 LAB — GLUCOSE, CAPILLARY
Glucose-Capillary: 234 mg/dL — ABNORMAL HIGH (ref 70–99)
Glucose-Capillary: 258 mg/dL — ABNORMAL HIGH (ref 70–99)

## 2024-05-16 LAB — T4, FREE: Free T4: 0.53 ng/dL — ABNORMAL LOW (ref 0.61–1.12)

## 2024-05-16 LAB — FOLATE: Folate: 7.6 ng/mL (ref >5.9)

## 2024-05-16 LAB — VITAMIN B12: Vitamin B-12: 596 pg/mL (ref 180–914)

## 2024-05-16 MED ORDER — METHIMAZOLE 5 MG PO TABS
5.0000 mg | ORAL_TABLET | Freq: Every day | ORAL | Status: DC
  Administered 2024-05-17 – 2024-05-20 (×4): 5 mg via ORAL
  Filled 2024-05-16 (×4): qty 1

## 2024-05-16 MED ORDER — METOPROLOL SUCCINATE ER 50 MG PO TB24
75.0000 mg | ORAL_TABLET | Freq: Two times a day (BID) | ORAL | Status: DC
  Administered 2024-05-16 – 2024-05-21 (×11): 75 mg via ORAL
  Filled 2024-05-16 (×11): qty 1

## 2024-05-16 NOTE — Telephone Encounter (Unsigned)
 Copied from CRM #[id number]. Topic: General - Other >> May 16, 2024  4:06 PM Deleta RAMAN wrote: Reason for CRM: patient sister is calling regarding speaking with nurse she would like for someone to give her a call back at [phone]

## 2024-05-16 NOTE — Plan of Care (Signed)
  Problem: Cardiac: Goal: Ability to achieve and maintain adequate cardiopulmonary perfusion will improve Outcome: Progressing   

## 2024-05-16 NOTE — TOC Initial Note (Signed)
 Transition of Care [facility]) - Initial/Assessment Note    Patient Details  Name: Alexandria Sherman MRN: [id number] Date of Birth: [birth year]  Transition of Care [facility]) CM/SW Contact:    Lauraine JAYSON Carpen, LCSW Phone Number: 05/16/2024, 2:48 PM  Clinical Narrative:  CSW spoke to sister this morning over the phone. CSW introduced role and explained that therapy recommendations would be discussed. Patient went to a SNF previously and sister said she had told her she would never go back to a SNF again. Sister is agreeable to SNF or home health if patient is. CSW went by to talk to patient but she was asleep and would not wake up to CSW calling her name. Will try again later.               Expected Discharge Plan:  (TBD) Barriers to Discharge: Continued Medical Work up   Patient Goals and CMS Choice            Expected Discharge Plan and Services     Post Acute Care Choice:  (TBD) Living arrangements for the past 2 months: Single Family Home                                      Prior Living Arrangements/Services Living arrangements for the past 2 months: Single Family Home Lives with:: Self Patient language and need for interpreter reviewed:: Yes        Need for Family Participation in Patient Care: Yes (Comment) Care giver support system in place?: Yes (comment) Current home services: DME Criminal Activity/Legal Involvement Pertinent to Current Situation/Hospitalization: No - Comment as needed  Activities of Daily Living   ADL Screening (condition at time of admission) Independently performs ADLs?: No Does the patient have a NEW difficulty with bathing/dressing/toileting/self-feeding that is expected to last >3 days?: Yes (Initiates electronic notice to provider for possible OT consult) Does the patient have a NEW difficulty with getting in/out of bed, walking, or climbing stairs that is expected to last >3 days?: Yes (Initiates electronic notice to provider for possible PT  consult) Does the patient have a NEW difficulty with communication that is expected to last >3 days?: No Is the patient deaf or have difficulty hearing?: Yes Does the patient have difficulty seeing, even when wearing glasses/contacts?: No Does the patient have difficulty concentrating, remembering, or making decisions?: No  Permission Sought/Granted Permission sought to share information with : Family Supports    Share Information with NAME: Arlean Clarity     Permission granted to share info w Relationship: Sister  Permission granted to share info w Contact Information: [phone]  Emotional Assessment Appearance:: Appears stated age Attitude/Demeanor/Rapport: Unable to Assess Affect (typically observed): Unable to Assess Orientation: : Oriented to Self, Oriented to Place Alcohol / Substance Use: Not Applicable Psych Involvement: No (comment)  Admission diagnosis:  Atrial fibrillation (HCC) [I48.91] Hypokalemia [E87.6] A-fib (HCC) [I48.91] Weakness [R53.1] Abnormal CT scan [R93.89] Atrial fibrillation with rapid ventricular response (HCC) [I48.91] Generalized weakness [R53.1] Urinary tract infection, acute [N39.0] Sepsis, due to unspecified organism, unspecified whether acute organ dysfunction present [facility]) [A41.9] Patient Active Problem List   Diagnosis Date Noted   Acute diastolic CHF (congestive heart failure) (HCC) 05/15/2024   Sepsis (HCC) 05/14/2024   Hypokalemia 05/14/2024   Shortness of breath 05/14/2024   Urinary tract infection, acute 05/11/2024   Atrial fibrillation with rapid ventricular response (HCC) 05/11/2024   Atrial  fibrillation (HCC) 05/10/2024   A-fib (HCC) 05/10/2024   DNR (do not resuscitate) discussion 04/03/2024   Lymphedema 04/03/2024   Cellulitis of foot, right 03/11/2024   Chronic diastolic CHF (congestive heart failure) (HCC) 01/17/2022   Generalized weakness 12/06/2021   Chronic venous insufficiency 11/10/2019   Malnutrition of mild degree  (HCC) 01/21/2018   Edema of extremities 08/07/2017   Advance directive discussed with patient 10/13/2014   Female stress incontinence 04/02/2013   Weakness 03/18/2013   Neuropathy (HCC) 08/01/2012   Routine general medical examination at a health care facility 05/09/2011   Episodic mood disorder (HCC) 12/19/2006   RBBB 12/19/2006   ALLERGIC RHINITIS 12/19/2006   GERD 12/19/2006   DIVERTICULOSIS, COLON 12/19/2006   PCP:  Marylynn Verneita CROME, MD Pharmacy:   [facility] [place], [place] - [address] [place] [address] Phone: [phone] Fax: [phone]     Social Drivers of Health (SDOH) Social History: SDOH Screenings   Food Insecurity: Food Insecurity Present (05/10/2024)  Housing: Low Risk  (05/10/2024)  Transportation Needs: No Transportation Needs (05/10/2024)  Utilities: Not At Risk (05/10/2024)  Alcohol Screen: Low Risk  (03/31/2024)  Depression (PHQ2-9): Medium Risk (05/02/2024)  Financial Resource Strain: Low Risk  (03/31/2024)  Physical Activity: Inactive (03/31/2024)  Social Connections: Socially Isolated (05/10/2024)  Stress: Stress Concern Present (03/31/2024)  Tobacco Use: Low Risk  (05/10/2024)  Health Literacy: Adequate Health Literacy (03/31/2024)   SDOH Interventions: Food Insecurity Interventions: Inpatient TOC (Resources added to AVS.) Social Connections Interventions: Inpatient TOC (Resources added to AVS.)   Readmission Risk Interventions     No data to display

## 2024-05-16 NOTE — Progress Notes (Signed)
  Progress Note  Patient Name: Alexandria Sherman Date of Encounter: 05/16/2024 [facility] Cardiologist: Deatrice Cage, MD   Interval Summary    Afib rates labile, 90-120s. She remains on 2L O2. She denies chest pain. She feels breathing is improving. No heart racing or palpitations. UOP - . Kidney function stable.  Vital Signs Vitals:   05/15/24 1936 05/15/24 2335 05/16/24 0300 05/16/24 0818  BP:  (!) 126/92 118/86 118/87  Pulse: (!) 101 90 97   Resp:      Temp: 97.7 F (36.5 C) 98.8 F (37.1 C) 98.3 F (36.8 C) 98.2 F (36.8 C)  TempSrc: Oral Oral Oral Oral  SpO2: 98% 99% 99% 98%  Weight:      Height:        Intake/Output Summary (Last 24 hours) at 05/16/2024 0825 Last data filed at 05/15/2024 1945 Gross per 24 hour  Intake 390 ml  Output 150 ml  Net 240 ml      05/10/2024    2:38 PM 04/03/2024   12:15 PM 03/31/2024    9:27 AM  Last 3 Weights  Weight (lbs) 136 lb 7.4 oz 130 lb 130 lb  Weight (kg) 61.9 kg 58.968 kg 58.968 kg      Telemetry/ECG  Afib HR 90-120s, PVCs - Personally Reviewed  Physical Exam  GEN: No acute distress.   Neck: No JVD Cardiac: Irreg IRreg, no murmurs, rubs, or gallops.  Respiratory: Clear to auscultation bilaterally. GI: Soft, nontender, non-distended  MS: trace lower leg edema  CV studies:   Echo 04/2024 1. Left ventricular ejection fraction, by estimation, is 50 to 55%. The  left ventricle has low normal function. The left ventricle has no regional  wall motion abnormalities. Left ventricular diastolic parameters are  indeterminate.   2. Right ventricular systolic function is normal. The right ventricular  size is normal.   3. The mitral valve is normal in structure. Mild mitral valve  regurgitation.   4. The aortic valve is tricuspid. Aortic valve regurgitation is not  visualized.   5. The inferior vena cava is normal in size with greater than 50%  respiratory variability, suggesting right atrial pressure of 3  mmHg.   Assessment & Plan   New onset Afib - new onset afib in the setting of sepsis and hyperthyroidism - IV dilt stopped due to soft pressures - pressures improved, but rates labile - Toprol  50mg  BID>will increase to 75mg  daily - continue Eliquis  5mg  BID - rates remain controlled - continue digoxin  0.0625mg  daily - need to consider TEE/DCCV if remains in Afib   Acute respiratory failure - improving, still on 2 L O2   Hyperthyroidism - patient recently started on methimazole  - per IM   Dilated pulmonary artery - on chest CTA - echo showed normal BiV function and normal CVP - IV lasix  20mg  daily - kidney function stable - continue with diuresis   Sepsis 2/2 URI - abx per IM   Venous insufficiency Lymphedema and possible PAD - IV lasix  - lower extremity US  negative    For questions or updates, please contact [facility] Please consult [email or web address] for contact info under       Signed, Jhordan Mckibben VEAR Fishman, PA-C

## 2024-05-16 NOTE — Telephone Encounter (Signed)
 Called and spoke with patient's sister Dagoberto on [place] who reported that pt is in the hospital with a UTI.  Dagoberto also reports that patient is having trouble getting logged back into Mychart.  Assisted Dagoberto but giving her patient's rhona username so that patient can get back in.  Dagoberto reports that patient has been working with [employer] support.  Dagoberto reports that they know pt will need a hospital follow up appointment with Dr. Tullo when she is released from hospital.

## 2024-05-16 NOTE — Progress Notes (Signed)
 [facility] Progress Note  Patient: Alexandria Sherman    FMW:[id number]  DOA: 05/10/2024     Date of Service: the patient was seen and examined on 05/16/2024  Chief Complaint  Patient presents with   Weakness   Brief hospital course: CHARLOTTIE PERAGINE is a 78 y.o. female with medical history significant of anxiety, depression, GERD, HTN, HLD, subclinical hyperthyroidism who was brought in to [facility] ED by ambulance for generalized weakness and shortness of breath that has worsened over the last week.  Patient lives alone but she does have a caregiver at home.  With EMS her heart rate was noted to have in 160s.  Patient tells me that she has a bilateral lower extremity swelling and chronic skin changes but does not carry the diagnosis of atrial fibrillation or congestive heart failure.  She also complains some dysuria but no fever no chills.  She denies any chest pain or palpitations.  She denies any diarrhea or vomiting.  Her main complaint was she was not able to walk around at her home.   In the ED, patient was found to be in atrial fibrillation with rapid ventricular response, new onset and also patient has positive urine analysis.  She also has a low TSH level which appears to be chronic in nature.  Patient was started on Cardizem  drip in the emergency room and was started on antibiotic.  Patient has a history of blisters with the use of cephalosporins in her vaginal area.   Hospitalist service was consulted for evaluation for admission for sepsis due to urinary tract infection and atrial fibrillation with rapid ventricular response.   Assessment and Plan:   # Sepsis due to urinary tract infection Sepsis physiology now resolved  Sepsis criteria POA: tachycardia, tachypneic and leukocytosis with UTI She is allergic to cephalosporin which causes blisters in her vaginal area. S/p Aztreonam , urine culture grew E. coli.  Transitioned to Bactrim  s/p 3 days Continues to have mild leukocytosis   Blood culture NGTD Monitor fever curve and WBC count   # Acute hypoxic respiratory failure with pulmonary edema Improving  In the setting of afib with RVR. Continue supplemental O2 inhalation and gradually wean off 8/18 s/p Lasix  40 mg x 1 dose given 8/19 Lasix  20 mg IV daily 8/20 IV lasix  20mg  BID  Continue IV diuresis per cardiology   #Metabolic alkalosis  Contracture alkalosis in setting of diuresis. Continue with IV diuresis.   # Atrial fibrillation w/RVR new onset, POA HR  improved but remain suboptimally controlled in the setting of sepsis and hyperthyroidism  CHADS2 DS 2 vasc score 4 (Age 73+sex 1+ HTN 1) S/p Cardizem  IV infusion, weaned off S/p Heparin  IV gtt, transition to Eliquis  on 8/18 8/18 s/p digoxin  0.25 mg IV x 2 doses  8/19 changed Lopressor  25 mg p.o. q6 Continue to monitor on telemetry TTE LVEF 50 to 55%, no WMA, no significant valvular pathology  Per cardiology  Digoxin , metop succinate, eliquis    # Hypethyroidism Undiagnosed previously, patient is poor historian, she is aware that her thyroid  function was abnl, does not remember taking any medications FT4 1.33, FT3 WNL, TSH 0.027  8/17 started methimazole  15 mg p.o. TID x 2 doses, and BID x 2 doses, followed by daily  8/19 Free T4 level 0.81 gradually decreasing  thyroid  antibodies Neg 8/22 FT4 low  Thyroid  sonogram: Right thyroid  nodule, needs monitoring Endocrinology outpatient referral  Trend free T4 daily Continue methimazole  at reduced dose of 5mg  daily  Macrocytosis without anemia Folate and B12 within normal limits CTM   #Chronic venous insufficiency  #Lymphedema #Possible PAD Concern for possible overlying fungal infection.  Continue Lotrisone  twice daily Keep lower extremities elevated while in bed Compression socks  Continue Lasix  as above Arterial duplex studies no significant blockages    # Anxiety/depression - Continue her home medications     # Hypokalemia resolved   #  Sundowning Continue Seroquel   Glaucoma, continue eyedrops   Body mass index is 23.42 kg/m.  Interventions:  Diet: Heart healthy/carb modified diet DVT Prophylaxis: Eliquis   Advance goals of care discussion: Full code  Family Communication: None at bedside  Disposition:  Pt is from Home, admitted with sepsis due to UTI and A-fib with RVR, continues to require management of A-fib.  Discharge to home, when stable, may need few days to improve.  Subjective: No issues over night or this AM.    Physical Exam:   Constitutional: In no distress.  Cardiovascular: Irregularly irregular. Trace bilateral  lower extremity edema  Pulmonary: Non labored breathing on [place], no wheezing or rales.   Abdominal: Soft. N Non distended and non tender Musculoskeletal: Normal range of motion.     Neurological: Alert and oriented to person, place, and time. Non focal  Skin: Skin is warm and dry. Bilateral LE hyperpigmented. Feet WTT.   Vitals:   05/16/24 1156 05/16/24 1157 05/16/24 1158 05/16/24 1503  BP: 121/80  124/85 112/66  Pulse:   96   Resp: (!) 26 (!) 29 (!) 22   Temp: 98.8 F (37.1 C)  98.6 F (37 C) 97.8 F (36.6 C)  TempSrc:    Oral  SpO2:   96%   Weight:      Height:        Intake/Output Summary (Last 24 hours) at 05/16/2024 1812 Last data filed at 05/16/2024 1450 Gross per 24 hour  Intake 210 ml  Output 1050 ml  Net -840 ml   Filed Weights   05/10/24 1438  Weight: 61.9 kg    Data Reviewed: I personally reviewed telemetry  CBC: Recent Labs  Lab 05/13/24 0020 05/13/24 0351 05/14/24 0348 05/15/24 0357 05/16/24 0452  WBC 13.2* 12.1* 12.3* 12.4*  12.4* 12.3*  NEUTROABS 10.5*  --   --  10.2*  --   HGB 12.9 12.7 12.8 13.3  13.3 12.9  HCT 40.5 41.3 42.2 44.3  44.1 42.3  MCV 97.1 99.3 101.7* 100.7*  100.9* 101.9*  PLT 298 283 246 273  267 261   Basic Metabolic Panel: Recent Labs  Lab 05/11/24 0225 05/12/24 0310 05/13/24 0020 05/13/24 0351 05/14/24 0348  05/15/24 0357 05/16/24 0452  NA 141 140 138 138 139 142 141  K 3.9 4.3 4.1 4.1 4.3 4.0 4.1  CL 105 107 103 102 104 101 97*  CO2 27 25 27 28 27  36* 36*  GLUCOSE 126* 121* 121* 97 68* 82 85  BUN 38* 31* 31* 32* 33* 25* 19  CREATININE 0.43* 0.49 0.46 0.58 0.66 0.52 0.55  CALCIUM  8.9 8.9 9.3 9.2 9.2 9.3 9.3  MG 2.1 2.1  --  2.1 2.1 2.1 2.1  PHOS 2.9 4.2  --  4.2 3.3  --   --     Studies: No results found.    Scheduled Meds:  apixaban   5 mg Oral BID   Chlorhexidine  Gluconate Cloth  6 each Topical QHS   clotrimazole -betamethasone   1 Application Topical BID   digoxin   0.0625 mg Oral Daily   divalproex   125 mg Oral BID   feeding supplement  237 mL Oral BID BM   furosemide   20 mg Intravenous Daily   latanoprost   1 drop Both Eyes QHS   [START ON 05/17/2024] methIMAzole   5 mg Oral Daily   metoprolol  succinate  75 mg Oral BID   pantoprazole   40 mg Oral Daily   QUEtiapine   12.5 mg Oral QPC supper   timolol   1 drop Both Eyes Daily   venlafaxine   75 mg Oral BID   Continuous Infusions:   PRN Meds: acetaminophen  **OR** acetaminophen , clorazepate , ipratropium-albuterol , nystatin , ondansetron  **OR** ondansetron  (ZOFRAN ) IV, polyethylene glycol  Time spent: 35 minutes  Author: Alban Pepper. MD [facility] 05/16/2024 6:12 PM  To reach On-call, see care teams to locate the attending and reach out to them via [email or web address]. If 7PM-7AM, please contact night-coverage If you still have difficulty reaching the attending provider, please page the [facility] (Director on Call) for [facility] on amion for assistance.

## 2024-05-17 DIAGNOSIS — I480 Paroxysmal atrial fibrillation: Secondary | ICD-10-CM | POA: Diagnosis not present

## 2024-05-17 DIAGNOSIS — I5030 Unspecified diastolic (congestive) heart failure: Secondary | ICD-10-CM | POA: Diagnosis not present

## 2024-05-17 DIAGNOSIS — N390 Urinary tract infection, site not specified: Secondary | ICD-10-CM | POA: Diagnosis not present

## 2024-05-17 LAB — BASIC METABOLIC PANEL WITH GFR
Anion gap: 10 (ref 5–15)
BUN: 18 mg/dL (ref 8–23)
CO2: 35 mmol/L — ABNORMAL HIGH (ref 22–32)
Calcium: 9.4 mg/dL (ref 8.9–10.3)
Chloride: 93 mmol/L — ABNORMAL LOW (ref 98–111)
Creatinine, Ser: 0.43 mg/dL — ABNORMAL LOW (ref 0.44–1.00)
GFR, Estimated: >60 mL/min (ref >60)
Glucose, Bld: 78 mg/dL (ref 70–99)
Potassium: 4.3 mmol/L (ref 3.5–5.1)
Sodium: 138 mmol/L (ref 135–145)

## 2024-05-17 LAB — CBC WITH DIFFERENTIAL/PLATELET
Abs Immature Granulocytes: 0.06 K/uL (ref 0.00–0.07)
Basophils Absolute: 0.1 K/uL (ref 0.0–0.1)
Basophils Relative: 1 %
Eosinophils Absolute: 0.1 K/uL (ref 0.0–0.5)
Eosinophils Relative: 1 %
HCT: 42.9 % (ref 36.0–46.0)
Hemoglobin: 13.2 g/dL (ref 12.0–15.0)
Immature Granulocytes: 1 %
Lymphocytes Relative: 7 %
Lymphs Abs: 0.9 K/uL (ref 0.7–4.0)
MCH: 31.1 pg (ref 26.0–34.0)
MCHC: 30.8 g/dL (ref 30.0–36.0)
MCV: 101.2 fL — ABNORMAL HIGH (ref 80.0–100.0)
Monocytes Absolute: 1 K/uL (ref 0.1–1.0)
Monocytes Relative: 8 %
Neutro Abs: 10.6 K/uL — ABNORMAL HIGH (ref 1.7–7.7)
Neutrophils Relative %: 82 %
Platelets: 252 K/uL (ref 150–400)
RBC: 4.24 MIL/uL (ref 3.87–5.11)
RDW: 14.1 % (ref 11.5–15.5)
WBC: 12.7 K/uL — ABNORMAL HIGH (ref 4.0–10.5)
nRBC: 0 % (ref 0.0–0.2)

## 2024-05-17 LAB — BRAIN NATRIURETIC PEPTIDE: B Natriuretic Peptide: 227.3 pg/mL — ABNORMAL HIGH (ref 0.0–100.0)

## 2024-05-17 LAB — MAGNESIUM: Magnesium: 1.9 mg/dL (ref 1.7–2.4)

## 2024-05-17 MED ORDER — MAGNESIUM SULFATE 2 GM/50ML IV SOLN
2.0000 g | Freq: Once | INTRAVENOUS | Status: AC
  Administered 2024-05-17: 2 g via INTRAVENOUS
  Filled 2024-05-17: qty 50

## 2024-05-17 NOTE — Progress Notes (Signed)
 Sent secure chat to Dr. Franchot to make him aware that after checking with the charge nurse and other nurses that we do not have compression socks, only ted hose and scd's. Waiting for reply.

## 2024-05-17 NOTE — Progress Notes (Signed)
  Progress Note  Patient Name: Alexandria Sherman Date of Encounter: 05/17/2024 [facility] Cardiologist: Deatrice Cage, MD   Interval Summary    Afib rates improved to 90-110. UOP -1.4L. kidneys stable. Breathing still seems to be a little short, she is on 2L O2.   Vital Signs Vitals:   05/16/24 2300 05/17/24 0000 05/17/24 0500 05/17/24 0800  BP: 131/75 (!) 89/62 102/66 111/77  Pulse: 90  78 95  Resp:  20  20  Temp: 98.3 F (36.8 C)  98.1 F (36.7 C)   TempSrc: Oral  Oral   SpO2: 99%  100% 99%  Weight:      Height:        Intake/Output Summary (Last 24 hours) at 05/17/2024 1001 Last data filed at 05/17/2024 0530 Gross per 24 hour  Intake 0 ml  Output 1400 ml  Net -1400 ml      05/10/2024    2:38 PM 04/03/2024   12:15 PM 03/31/2024    9:27 AM  Last 3 Weights  Weight (lbs) 136 lb 7.4 oz 130 lb 130 lb  Weight (kg) 61.9 kg 58.968 kg 58.968 kg      Telemetry/ECG  Afib HR 90-110 - Personally Reviewed  Physical Exam  GEN: No acute distress.   Neck: No JVD Cardiac: Irreg IRreg, no murmurs, rubs, or gallops.  Respiratory:diminished at bases GI: Soft, nontender, non-distended  MS: mild lower leg edema  CV studies:   Echo 04/2024 1. Left ventricular ejection fraction, by estimation, is 50 to 55%. The  left ventricle has low normal function. The left ventricle has no regional  wall motion abnormalities. Left ventricular diastolic parameters are  indeterminate.   2. Right ventricular systolic function is normal. The right ventricular  size is normal.   3. The mitral valve is normal in structure. Mild mitral valve  regurgitation.   4. The aortic valve is tricuspid. Aortic valve regurgitation is not  visualized.   5. The inferior vena cava is normal in size with greater than 50%  respiratory variability, suggesting right atrial pressure of 3 mmHg.   Assessment & Plan   New onset Afib - new onset afib in the setting of sepsis and hyperthyroidism - IV dilt  stopped due to soft pressures - rates remain fairly controlled - pressures still intermittently soft - Toprol  75mg  BID - continue Eliquis  5mg  BID - continue digoxin  0.0625mg  daily>can increase for rate control - can consider DCCV if remains in Afib after 4 weeks of a/c   Acute respiratory failure - improving, still on 2 L O2   Hyperthyroidism - patient recently started on methimazole  - per IM   Dilated pulmonary artery - on chest CTA - echo showed normal BiV function and normal CVP - IV lasix  20mg  daily - kidney function stable - Net -1.6L - continue with diuresis   Sepsis 2/2 URI - abx per IM   Venous insufficiency Lymphedema and possible PAD - IV lasix  - lower extremity US  negative      For questions or updates, please contact [facility] Please consult [email or web address] for contact info under       Signed, Shemeka Wardle VEAR Fishman, PA-C

## 2024-05-17 NOTE — Progress Notes (Signed)
 [facility] Progress Note  Patient: Alexandria Sherman    FMW:[id number]  DOA: 05/10/2024     Date of Service: the patient was seen and examined on 05/17/2024  Chief Complaint  Patient presents with   Weakness   Brief hospital course: Alexandria Sherman is a 78 y.o. female with medical history significant of anxiety, depression, GERD, HTN, HLD, subclinical hyperthyroidism who was brought in to [facility] ED by ambulance for generalized weakness and shortness of breath that has worsened over the last week.  Patient lives alone but she does have a caregiver at home.  With EMS her heart rate was noted to have in 160s.  Patient tells me that she has a bilateral lower extremity swelling and chronic skin changes but does not carry the diagnosis of atrial fibrillation or congestive heart failure.  She also complains some dysuria but no fever no chills.  She denies any chest pain or palpitations.  She denies any diarrhea or vomiting.  Her main complaint was she was not able to walk around at her home.   In the ED, patient was found to be in atrial fibrillation with rapid ventricular response, new onset and also patient has positive urine analysis.  She also has a low TSH level which appears to be chronic in nature.  Patient was started on Cardizem  drip in the emergency room and was started on antibiotic.  Patient has a history of blisters with the use of cephalosporins in her vaginal area.   Hospitalist service was consulted for evaluation for admission for sepsis due to urinary tract infection and atrial fibrillation with rapid ventricular response.   Assessment and Plan:   # Sepsis due to urinary tract infection Sepsis physiology now resolved  Sepsis criteria POA: tachycardia, tachypneic and leukocytosis with UTI She is allergic to cephalosporin which causes blisters in her vaginal area. S/p Aztreonam , urine culture grew E. coli.  Transitioned to Bactrim  s/p 3 day course  Continues to have mild  leukocytosis  Blood culture NGTD Monitor fever curve and WBC count   HFpEF # Acute hypoxic respiratory failure with pulmonary edema Improving  In the setting of afib with RVR.  8/18 s/p Lasix  40 mg x 1 dose given 8/19 Lasix  20 mg IV daily 8/20 IV lasix  20mg  BID  Continue IV diuresis per cardiology  Continue supplemental O2 wean as able   #Metabolic alkalosis  Contracture alkalosis in setting of diuresis. Continue with IV diuresis.   # Paroxysmal Atrial fibrillation  HR improved in the setting of sepsis and hyperthyroidism  CHADS2 DS 2 vasc score 4 (Age 67+sex 1+ HTN 1) S/p Cardizem  IV infusion, weaned off S/p Heparin  IV gtt, transition to Eliquis  on 8/18 8/18 s/p digoxin  0.25 mg IV x 2 doses  8/19 changed Lopressor  25 mg p.o. q6 Continue to monitor on telemetry TTE LVEF 50 to 55%, no WMA, no significant valvular pathology  Per cardiology  Digoxin , metop succinate, eliquis    # Hypethyroidism Undiagnosed previously, patient is poor historian, she is aware that her thyroid  function was abnl, does not remember taking any medications FT4 1.33, FT3 WNL, TSH 0.027  8/17 started methimazole  15 mg p.o. TID x 2 doses, and BID x 2 doses, followed by daily  8/19 Free T4 level 0.81 gradually decreasing  thyroid  antibodies Neg 8/22 FT4 low  Thyroid  sonogram: Right thyroid  nodule, needs monitoring Endocrinology outpatient referral  Trend free T4 daily Continue methimazole  at reduced dose of 5mg  daily    Macrocytosis without anemia  Folate and B12 within normal limits CTM   #Chronic venous insufficiency  #Lymphedema Arterial duplex studies no significant blockages Keep lower extremities elevated while in bed Compression socks  Continue Lasix  as above   # Anxiety/depression - Continue her home medications   # Sundowning Continue Seroquel   Glaucoma, continue eyedrops   Body mass index is 23.42 kg/m.  Interventions:  Diet: Heart healthy/carb modified diet DVT Prophylaxis:  Eliquis   Advance goals of care discussion: Full code  Family Communication: None at bedside  Disposition:  Pt is from Home, admitted with sepsis due to UTI and A-fib with RVR, continues to require management of A-fib.  Discharge to SNF, when stable, may need few days to improve.  Subjective: No issues over night or this AM.    Physical Exam:  Constitutional: In no distress.  Cardiovascular: Irregularly irregular. Trace bilateral lower extremity edema  Pulmonary: Non labored breathing on room air, no wheezing or rales.   Abdominal: Soft. Non distended and non tender MSK: Significant bony abnormalities of hands limits mobility    Neurological: Alert and oriented to person, place, and time. Non focal  Skin: Skin is warm and dry.    Vitals:   05/17/24 0800 05/17/24 1013 05/17/24 1248 05/17/24 1600  BP: 111/77 111/77 114/80 117/85  Pulse: 95 95 99 83  Resp: 20  (!) 22 (!) 24  Temp: 98 F (36.7 C)  98.2 F (36.8 C) 97.9 F (36.6 C)  TempSrc: Oral  Oral Axillary  SpO2: 99%  98% 96%  Weight:      Height:        Intake/Output Summary (Last 24 hours) at 05/17/2024 1720 Last data filed at 05/17/2024 1357 Gross per 24 hour  Intake 46.97 ml  Output 1100 ml  Net -1053.03 ml   Filed Weights   05/10/24 1438  Weight: 61.9 kg    Data Reviewed: I personally reviewed telemetry  CBC: Recent Labs  Lab 05/13/24 0020 05/13/24 0351 05/14/24 0348 05/15/24 0357 05/16/24 0452 05/17/24 0628  WBC 13.2* 12.1* 12.3* 12.4*  12.4* 12.3* 12.7*  NEUTROABS 10.5*  --   --  10.2*  --  10.6*  HGB 12.9 12.7 12.8 13.3  13.3 12.9 13.2  HCT 40.5 41.3 42.2 44.3  44.1 42.3 42.9  MCV 97.1 99.3 101.7* 100.7*  100.9* 101.9* 101.2*  PLT 298 283 246 273  267 261 252   Basic Metabolic Panel: Recent Labs  Lab 05/11/24 0225 05/12/24 0310 05/13/24 0020 05/13/24 0351 05/14/24 0348 05/15/24 0357 05/16/24 0452 05/17/24 0628  NA 141 140   < > 138 139 142 141 138  K 3.9 4.3   < > 4.1 4.3 4.0  4.1 4.3  CL 105 107   < > 102 104 101 97* 93*  CO2 27 25   < > 28 27 36* 36* 35*  GLUCOSE 126* 121*   < > 97 68* 82 85 78  BUN 38* 31*   < > 32* 33* 25* 19 18  CREATININE 0.43* 0.49   < > 0.58 0.66 0.52 0.55 0.43*  CALCIUM  8.9 8.9   < > 9.2 9.2 9.3 9.3 9.4  MG 2.1 2.1  --  2.1 2.1 2.1 2.1 1.9  PHOS 2.9 4.2  --  4.2 3.3  --   --   --    < > = values in this interval not displayed.    Studies: No results found.    Scheduled Meds:  apixaban   5 mg Oral BID  Chlorhexidine  Gluconate Cloth  6 each Topical QHS   clotrimazole -betamethasone   1 Application Topical BID   digoxin   0.0625 mg Oral Daily   divalproex   125 mg Oral BID   feeding supplement  237 mL Oral BID BM   furosemide   20 mg Intravenous Daily   latanoprost   1 drop Both Eyes QHS   methIMAzole   5 mg Oral Daily   metoprolol  succinate  75 mg Oral BID   pantoprazole   40 mg Oral Daily   QUEtiapine   12.5 mg Oral QPC supper   timolol   1 drop Both Eyes Daily   venlafaxine   75 mg Oral BID   Continuous Infusions:   PRN Meds: acetaminophen  **OR** acetaminophen , clorazepate , ipratropium-albuterol , nystatin , ondansetron  **OR** ondansetron  (ZOFRAN ) IV, polyethylene glycol  Time spent: 35 minutes  Author: Alban Pepper. MD [facility] 05/17/2024 5:20 PM  To reach On-call, see care teams to locate the attending and reach out to them via [email or web address]. If 7PM-7AM, please contact night-coverage If you still have difficulty reaching the attending provider, please page the [facility] (Director on Call) for [facility] on amion for assistance.

## 2024-05-17 NOTE — Plan of Care (Signed)

## 2024-05-17 NOTE — Plan of Care (Signed)
  Problem: Cardiac: Goal: Ability to achieve and maintain adequate cardiopulmonary perfusion will improve Outcome: Progressing   Problem: Clinical Measurements: Goal: Respiratory complications will improve Outcome: Progressing Goal: Cardiovascular complication will be avoided Outcome: Progressing   Problem: Elimination: Goal: Will not experience complications related to urinary retention Outcome: Progressing   Problem: Safety: Goal: Ability to remain free from injury will improve Outcome: Progressing

## 2024-05-18 DIAGNOSIS — I4891 Unspecified atrial fibrillation: Secondary | ICD-10-CM | POA: Diagnosis not present

## 2024-05-18 DIAGNOSIS — I5031 Acute diastolic (congestive) heart failure: Secondary | ICD-10-CM | POA: Diagnosis not present

## 2024-05-18 DIAGNOSIS — I480 Paroxysmal atrial fibrillation: Secondary | ICD-10-CM | POA: Diagnosis not present

## 2024-05-18 DIAGNOSIS — I5030 Unspecified diastolic (congestive) heart failure: Secondary | ICD-10-CM | POA: Diagnosis not present

## 2024-05-18 LAB — CBC WITH DIFFERENTIAL/PLATELET
Abs Immature Granulocytes: 0.04 K/uL (ref 0.00–0.07)
Basophils Absolute: 0 K/uL (ref 0.0–0.1)
Basophils Relative: 0 %
Eosinophils Absolute: 0.1 K/uL (ref 0.0–0.5)
Eosinophils Relative: 1 %
HCT: 40.6 % (ref 36.0–46.0)
Hemoglobin: 12.5 g/dL (ref 12.0–15.0)
Immature Granulocytes: 0 %
Lymphocytes Relative: 9 %
Lymphs Abs: 1.1 K/uL (ref 0.7–4.0)
MCH: 30.4 pg (ref 26.0–34.0)
MCHC: 30.8 g/dL (ref 30.0–36.0)
MCV: 98.8 fL (ref 80.0–100.0)
Monocytes Absolute: 1.1 K/uL — ABNORMAL HIGH (ref 0.1–1.0)
Monocytes Relative: 9 %
Neutro Abs: 9.1 K/uL — ABNORMAL HIGH (ref 1.7–7.7)
Neutrophils Relative %: 81 %
Platelets: 239 K/uL (ref 150–400)
RBC: 4.11 MIL/uL (ref 3.87–5.11)
RDW: 13.8 % (ref 11.5–15.5)
WBC: 11.4 K/uL — ABNORMAL HIGH (ref 4.0–10.5)
nRBC: 0 % (ref 0.0–0.2)

## 2024-05-18 LAB — BASIC METABOLIC PANEL WITH GFR
Anion gap: 9 (ref 5–15)
BUN: 18 mg/dL (ref 8–23)
CO2: 36 mmol/L — ABNORMAL HIGH (ref 22–32)
Calcium: 9 mg/dL (ref 8.9–10.3)
Chloride: 93 mmol/L — ABNORMAL LOW (ref 98–111)
Creatinine, Ser: 0.33 mg/dL — ABNORMAL LOW (ref 0.44–1.00)
GFR, Estimated: >60 mL/min (ref >60)
Glucose, Bld: 70 mg/dL (ref 70–99)
Potassium: 3.9 mmol/L (ref 3.5–5.1)
Sodium: 138 mmol/L (ref 135–145)

## 2024-05-18 LAB — T4, FREE: Free T4: 0.55 ng/dL — ABNORMAL LOW (ref 0.61–1.12)

## 2024-05-18 LAB — MAGNESIUM: Magnesium: 2.3 mg/dL (ref 1.7–2.4)

## 2024-05-18 NOTE — Plan of Care (Signed)
  Problem: Clinical Measurements: Goal: Cardiovascular complication will be avoided Outcome: Progressing   Problem: Coping: Goal: Level of anxiety will decrease Outcome: Progressing   Problem: Elimination: Goal: Will not experience complications related to urinary retention Outcome: Progressing   Problem: Pain Managment: Goal: General experience of comfort will improve and/or be controlled Outcome: Progressing   Problem: Safety: Goal: Ability to remain free from injury will improve Outcome: Progressing   Problem: Skin Integrity: Goal: Risk for impaired skin integrity will decrease Outcome: Progressing

## 2024-05-18 NOTE — Progress Notes (Signed)
  Progress Note  Patient Name: Alexandria Sherman Date of Encounter: 05/18/2024 [facility] Cardiologist: Deatrice Cage, MD   Interval Summary    Afib rates in the 80s.UOP -1.2L. patient is overall feeling Ok. She remains in 2L O2.   Vital Signs Vitals:   05/18/24 0000 05/18/24 0100 05/18/24 0304 05/18/24 0446  BP: (!) 77/54  108/70   Pulse:      Resp: 20 20 (!) 21   Temp:    97.6 F (36.4 C)  TempSrc:      SpO2:      Weight:      Height:        Intake/Output Summary (Last 24 hours) at 05/18/2024 0718 Last data filed at 05/17/2024 2322 Gross per 24 hour  Intake 46.97 ml  Output 1200 ml  Net -1153.03 ml      05/10/2024    2:38 PM 04/03/2024   12:15 PM 03/31/2024    9:27 AM  Last 3 Weights  Weight (lbs) 136 lb 7.4 oz 130 lb 130 lb  Weight (kg) 61.9 kg 58.968 kg 58.968 kg      Telemetry/ECG  Afib HR 80s - Personally Reviewed  Physical Exam  GEN: No acute distress.   Neck: No JVD Cardiac: Irreg Irreg, no murmurs, rubs, or gallops.  Respiratory: diminished at bases GI: Soft, nontender, non-distended  MS: mild lower leg edema  CV studies:   Echo 04/2024 1. Left ventricular ejection fraction, by estimation, is 50 to 55%. The  left ventricle has low normal function. The left ventricle has no regional  wall motion abnormalities. Left ventricular diastolic parameters are  indeterminate.   2. Right ventricular systolic function is normal. The right ventricular  size is normal.   3. The mitral valve is normal in structure. Mild mitral valve  regurgitation.   4. The aortic valve is tricuspid. Aortic valve regurgitation is not  visualized.   5. The inferior vena cava is normal in size with greater than 50%  respiratory variability, suggesting right atrial pressure of 3 mmHg.   Assessment & Plan   New onset Afib - new onset afib in the setting of sepsis and hyperthyroidism - IV dilt stopped due to soft pressures - rates in the 80s - pressures still  intermittently soft - Toprol  75mg  BID - continue Eliquis  5mg  BID - continue digoxin  0.0625mg  daily - can consider DCCV if remains in Afib after 4 weeks of a/c   Acute respiratory failure - improving, still on 2 L O2   Hyperthyroidism - patient recently started on methimazole  - per IM   Dilated pulmonary artery - on chest CTA - echo showed normal BiV function and normal CVP - IV lasix  20mg  daily - kidney function stable - Net -2.8L - continue with diuresis   Sepsis 2/2 URI - abx per IM   Venous insufficiency Lymphedema and possible PAD - IV lasix  - lower extremity US  negative    For questions or updates, please contact [facility] Please consult [email or web address] for contact info under       Signed, Golden Emile VEAR Fishman, PA-C

## 2024-05-18 NOTE — Plan of Care (Signed)

## 2024-05-18 NOTE — Progress Notes (Addendum)
 [facility] Progress Note  Patient: Alexandria Sherman    FMW:[id number]  DOA: 05/10/2024     Date of Service: the patient was seen and examined on 05/18/2024  Chief Complaint  Patient presents with   Weakness   Brief hospital course: Alexandria Sherman is a 78 y.o. female with medical history significant of anxiety, depression, GERD, HTN, HLD, subclinical hyperthyroidism who was brought in to [facility] ED by ambulance for generalized weakness and shortness of breath that has worsened over the last week.  Patient lives alone but she does have a caregiver at home.  With EMS her heart rate was noted to have in 160s.  Patient tells me that she has a bilateral lower extremity swelling and chronic skin changes but does not carry the diagnosis of atrial fibrillation or congestive heart failure.  She also complains some dysuria but no fever no chills.  She denies any chest pain or palpitations.  She denies any diarrhea or vomiting.  Her main complaint was she was not able to walk around at her home.   In the ED, patient was found to be in atrial fibrillation with rapid ventricular response, new onset and also patient has positive urine analysis.  She also has a low TSH level which appears to be chronic in nature.  Patient was started on Cardizem  drip in the emergency room and was started on antibiotic.  Patient has a history of blisters with the use of cephalosporins in her vaginal area.   Hospitalist service was consulted for evaluation for admission for sepsis due to urinary tract infection and atrial fibrillation with rapid ventricular response.   Assessment and Plan:   # Sepsis due to urinary tract infection Sepsis physiology now resolved  Sepsis criteria POA: tachycardia, tachypneic and leukocytosis with UTI She is allergic to cephalosporin which causes blisters in her vaginal area. S/p Aztreonam , urine culture grew E. coli.  Transitioned to Bactrim  s/p 3 day course  Continues to have mild  leukocytosis  Blood culture NGTD Monitor fever curve and WBC count   HFpEF # Acute hypoxic respiratory failure with pulmonary edema Improving  In the setting of afib with RVR.  8/18 s/p Lasix  40 mg x 1 dose given 8/19 Lasix  20 mg IV daily 8/20 IV lasix  20mg  BID  Continue IV diuresis per cardiology  Continue supplemental O2 wean as able   #Metabolic alkalosis  Contracture alkalosis in setting of diuresis. Continue with IV diuresis.   # Paroxysmal Atrial fibrillation  HR improved in the setting of sepsis and hyperthyroidism  CHADS2 DS 2 vasc score 4 (Age 15+sex 1+ HTN 1) S/p Cardizem  IV infusion, weaned off S/p Heparin  IV gtt, transition to Eliquis  on 8/18 8/18 s/p digoxin  0.25 mg IV x 2 doses  8/19 changed Lopressor  25 mg p.o. q6 Continue to monitor on telemetry TTE LVEF 50 to 55%, no WMA, no significant valvular pathology  Per cardiology  Digoxin , metop succinate, eliquis , will need cardioversion in 3 weeks if remains in afib   # Hypethyroidism Undiagnosed previously, patient is poor historian, she is aware that her thyroid  function was abnl, does not remember taking any medications FT4 1.33, FT3 WNL, TSH 0.027  8/17 started methimazole  15 mg p.o. TID x 2 doses, and BID x 2 doses, followed by daily  8/19 Free T4 level 0.81 gradually decreasing  thyroid  antibodies Neg 8/22 FT4 low  Thyroid  sonogram: Right thyroid  nodule, needs monitoring Endocrinology outpatient referral   Continue methimazole  at reduced dose of 5mg   daily    Macrocytosis without anemia Folate and B12 within normal limits CTM   #Chronic venous insufficiency  #Lymphedema Arterial duplex studies no significant blockages Keep lower extremities elevated while in bed Compression socks  Continue Lasix  as above   # Anxiety/depression - Continue her home medications   # Sundowning Continue Seroquel   Glaucoma, continue eyedrops   Body mass index is 22.4 kg/m.  Interventions:  Diet: Heart  healthy/carb modified diet DVT Prophylaxis: Eliquis   Advance goals of care discussion: Full code  Family Communication: None at bedside  Disposition:  Pt is from Home, admitted with sepsis due to UTI and A-fib with RVR, HR now improved. Will need SNF.    Subjective: No issues overnight.    Physical Exam:   Constitutional: In no distress.  Cardiovascular: Irregularly irregular.. Trace bilateral lower extremity edema  Pulmonary: Non labored breathing on room air, no wheezing or rales.   Abdominal: Soft. Non distended and non tender Musculoskeletal: Normal range of motion.     Neurological: Alert and oriented to person, place, and time. Non focal  Skin: Feet are cool to touch. +cap refill    Vitals:   05/18/24 1056 05/18/24 1107 05/18/24 1112 05/18/24 1200  BP:    98/72  Pulse: 90   100  Resp:    20  Temp:    98.5 F (36.9 C)  TempSrc:    Oral  SpO2:  98%  99%  Weight:   59.2 kg   Height:        Intake/Output Summary (Last 24 hours) at 05/18/2024 1454 Last data filed at 05/18/2024 1412 Gross per 24 hour  Intake 0 ml  Output 600 ml  Net -600 ml   Filed Weights   05/10/24 1438 05/18/24 1112  Weight: 61.9 kg 59.2 kg    Data Reviewed: I personally reviewed telemetry  CBC: Recent Labs  Lab 05/13/24 0020 05/13/24 0351 05/14/24 0348 05/15/24 0357 05/16/24 0452 05/17/24 0628 05/18/24 0403  WBC 13.2*   < > 12.3* 12.4*  12.4* 12.3* 12.7* 11.4*  NEUTROABS 10.5*  --   --  10.2*  --  10.6* 9.1*  HGB 12.9   < > 12.8 13.3  13.3 12.9 13.2 12.5  HCT 40.5   < > 42.2 44.3  44.1 42.3 42.9 40.6  MCV 97.1   < > 101.7* 100.7*  100.9* 101.9* 101.2* 98.8  PLT 298   < > 246 273  267 261 252 239   < > = values in this interval not displayed.   Basic Metabolic Panel: Recent Labs  Lab 05/12/24 0310 05/13/24 0020 05/13/24 0351 05/14/24 0348 05/15/24 0357 05/16/24 0452 05/17/24 0628 05/18/24 0403  NA 140   < > 138 139 142 141 138 138  K 4.3   < > 4.1 4.3 4.0 4.1  4.3 3.9  CL 107   < > 102 104 101 97* 93* 93*  CO2 25   < > 28 27 36* 36* 35* 36*  GLUCOSE 121*   < > 97 68* 82 85 78 70  BUN 31*   < > 32* 33* 25* 19 18 18   CREATININE 0.49   < > 0.58 0.66 0.52 0.55 0.43* 0.33*  CALCIUM  8.9   < > 9.2 9.2 9.3 9.3 9.4 9.0  MG 2.1  --  2.1 2.1 2.1 2.1 1.9 2.3  PHOS 4.2  --  4.2 3.3  --   --   --   --    < > =  values in this interval not displayed.    Studies: No results found.    Scheduled Meds:  apixaban   5 mg Oral BID   Chlorhexidine  Gluconate Cloth  6 each Topical QHS   digoxin   0.0625 mg Oral Daily   divalproex   125 mg Oral BID   feeding supplement  237 mL Oral BID BM   furosemide   20 mg Intravenous Daily   latanoprost   1 drop Both Eyes QHS   methIMAzole   5 mg Oral Daily   metoprolol  succinate  75 mg Oral BID   pantoprazole   40 mg Oral Daily   QUEtiapine   12.5 mg Oral QPC supper   timolol   1 drop Both Eyes Daily   venlafaxine   75 mg Oral BID   Continuous Infusions:   PRN Meds: acetaminophen  **OR** acetaminophen , clorazepate , ipratropium-albuterol , nystatin , ondansetron  **OR** ondansetron  (ZOFRAN ) IV, polyethylene glycol  Time spent: 35 minutes  Author: Alban Pepper. MD [facility] 05/18/2024 2:54 PM  To reach On-call, see care teams to locate the attending and reach out to them via [email or web address]. If 7PM-7AM, please contact night-coverage If you still have difficulty reaching the attending provider, please page the [facility] (Director on Call) for [facility] on amion for assistance.

## 2024-05-19 DIAGNOSIS — I4819 Other persistent atrial fibrillation: Secondary | ICD-10-CM | POA: Diagnosis not present

## 2024-05-19 DIAGNOSIS — I5031 Acute diastolic (congestive) heart failure: Secondary | ICD-10-CM | POA: Diagnosis not present

## 2024-05-19 DIAGNOSIS — I4891 Unspecified atrial fibrillation: Secondary | ICD-10-CM | POA: Diagnosis not present

## 2024-05-19 LAB — BASIC METABOLIC PANEL WITH GFR
Anion gap: 12 (ref 5–15)
BUN: 15 mg/dL (ref 8–23)
CO2: 34 mmol/L — ABNORMAL HIGH (ref 22–32)
Calcium: 9.5 mg/dL (ref 8.9–10.3)
Chloride: 89 mmol/L — ABNORMAL LOW (ref 98–111)
Creatinine, Ser: 0.37 mg/dL — ABNORMAL LOW (ref 0.44–1.00)
GFR, Estimated: >60 mL/min (ref >60)
Glucose, Bld: 85 mg/dL (ref 70–99)
Potassium: 4.4 mmol/L (ref 3.5–5.1)
Sodium: 135 mmol/L (ref 135–145)

## 2024-05-19 LAB — MAGNESIUM: Magnesium: 2.2 mg/dL (ref 1.7–2.4)

## 2024-05-19 MED ORDER — POLYETHYLENE GLYCOL 3350 17 G PO PACK
17.0000 g | PACK | Freq: Every day | ORAL | Status: DC
  Administered 2024-05-19 – 2024-05-21 (×3): 17 g via ORAL
  Filled 2024-05-19 (×3): qty 1

## 2024-05-19 MED ORDER — METOPROLOL TARTRATE 5 MG/5ML IV SOLN: 2.5000 mg | Freq: Once | INTRAVENOUS | Status: DC

## 2024-05-19 MED ORDER — FUROSEMIDE 10 MG/ML IJ SOLN
20.0000 mg | Freq: Two times a day (BID) | INTRAMUSCULAR | Status: DC
  Administered 2024-05-19: 20 mg via INTRAVENOUS
  Filled 2024-05-19 (×2): qty 2

## 2024-05-19 MED ORDER — ORAL CARE MOUTH RINSE: 15.0000 mL | OROMUCOSAL | Status: DC | PRN

## 2024-05-19 MED ORDER — SENNA 8.6 MG PO TABS
1.0000 | ORAL_TABLET | Freq: Every day | ORAL | Status: DC
  Administered 2024-05-19 – 2024-05-21 (×3): 8.6 mg via ORAL
  Filled 2024-05-19 (×3): qty 1

## 2024-05-19 NOTE — TOC CM/SW Note (Signed)
    Durable Medical Equipment  (From admission, onward)           Start     Ordered   05/19/24 1502  For home use only DME Hospital bed  Once       Question Answer Comment  Length of Need 12 Months   Patient has (list medical condition): severe arthritis   Bed type Semi-electric   Hoyer Lift Yes      05/19/24 1502   05/19/24 1501  For home use only DME 3 n 1  Once        05/19/24 1502

## 2024-05-19 NOTE — TOC Progression Note (Addendum)
 Transition of Care [facility]) - Progression Note    Patient Details  Name: Alexandria Sherman MRN: [id number] Date of Birth: Dec 30, 1945  [facility] of [facility]) CM/SW Contact  Lauraine JAYSON Carpen, LCSW Phone Number: 05/19/2024, 11:26 AM  Clinical Narrative:   CSW met with patient. She would prefer home health rather than going to SNF. CSW called sister who agrees with this plan. No home health agency preference. [facility] has accepted for PT, OT, RN. Sister agreeable to DME recommendations for 3-in-1, hoyer lift, and hospital bed. She declined wheelchair as they have one at home that can fit through doorways. Patient also remains on acute oxygen. Will follow for this potential discharge need as well.  3:21 pm: Ordered 3-in-1, hospital bed, and hoyer lift through [employer]. CSW called sister and provided update. She will see if caregiver would be able to transport her home or not. Also discussed potential ambulance transport.  Expected Discharge Plan:  (TBD) Barriers to Discharge: Continued Medical Work up               Expected Discharge Plan and Services     Post Acute Care Choice:  (TBD) Living arrangements for the past 2 months: Single Family Home                                       Social Drivers of Health (SDOH) Interventions SDOH Screenings   Food Insecurity: Food Insecurity Present (05/10/2024)  Housing: Low Risk  (05/10/2024)  Transportation Needs: No Transportation Needs (05/10/2024)  Utilities: Not At Risk (05/10/2024)  Alcohol Screen: Low Risk  (03/31/2024)  Depression (PHQ2-9): Medium Risk (05/02/2024)  Financial Resource Strain: Low Risk  (03/31/2024)  Physical Activity: Inactive (03/31/2024)  Social Connections: Socially Isolated (05/10/2024)  Stress: Stress Concern Present (03/31/2024)  Tobacco Use: Low Risk  (05/10/2024)  Health Literacy: Adequate Health Literacy (03/31/2024)    Readmission Risk Interventions     No data to display

## 2024-05-19 NOTE — Progress Notes (Signed)
 Pt oriented to self, no acute events overnight  Pt states that she wants to get up and sit at the table like I did earlier today  - per review of medical history and encounters, pt is mostly bedbound/chairbound at baseline. Pt is confined d/t physical weakness to couch or bed at home, and has visiting aides that help her up 1-2 person with a gait belt at baseline at home. Baseline lymphedema with polyneuropathy that severely limits ambulation.

## 2024-05-19 NOTE — TOC CM/SW Note (Signed)
 Patient is not able to walk the distance required to go the bathroom, or he/she is unable to safely negotiate stairs required to access the bathroom.  A 3in1 BSC will alleviate this problem

## 2024-05-19 NOTE — Progress Notes (Signed)
 Physical Therapy Treatment Patient Details Name: Alexandria Sherman MRN: [id number] DOB: 08/19/1946 Today's Date: 05/19/2024   History of Present Illness Alexandria Sherman is a 78 y.o. female with medical history significant of anxiety, depression, GERD, HTN, HLD, subclinical hyperthyroidism who was brought in to [facility] ED by ambulance for generalized weakness and worsening shortness of breath.  MD assessment includes: sepsis due to urinary tract infection, acute hypoxic respiratory failure with pulmonary edema, and metabolic alkalosis.    PT Comments  Pt was pleasant and motivated to participate during the session and put forth good effort throughout. Pt required physical assistance for BLE and trunk control with all bed mobility tasks.  Once in sitting unsupported at the EOB the pt was able to maintain static sitting balance without assist and participate in sitting therex.  Pt made multiple attempts to come to standing at the EOB with +1 assist and was unable to clear the surface of the mattress.  Pt then provided +2 assist with use of sara stedy and was able to come to standing but did require extensive physical assist.  Pt reported no adverse symptoms during the session.  Pt will benefit from continued PT services upon discharge to safely address deficits listed in patient problem list for decreased caregiver assistance and eventual return to PLOF.       If plan is discharge home, recommend the following: Two people to help with walking and/or transfers;Two people to help with bathing/dressing/bathroom   Can travel by private vehicle     No  Equipment Recommendations  Wheelchair (measurements PT);Wheelchair cushion (measurements PT);Hoyer lift;Other (comment)    Recommendations for Other Services       Precautions / Restrictions Precautions Precautions: Fall Restrictions Weight Bearing Restrictions Per Provider Order: No     Mobility  Bed Mobility Overal bed mobility: Needs  Assistance Bed Mobility: Supine to Sit, Sit to Supine     Supine to sit: Mod assist, HOB elevated, Used rails Sit to supine: Mod assist, +2 for physical assistance   General bed mobility comments: Physical assist required for BLE and trunk control    Transfers Overall transfer level: Needs assistance   Transfers: Sit to/from Stand Sit to Stand: Max assist, +2 physical assistance, From elevated surface           General transfer comment: Pt required extensive +2 assist to come to standing from an elevated EOB with use of the sara stedy lift with mod verbal and tactile cues for sequenicng Transfer via Lift Equipment: Camie Huff  Ambulation/Gait               General Gait Details: unable   Optometrist     Tilt Bed    Modified Rankin (Stroke Patients Only)       Balance                                            Communication Communication Communication: No apparent difficulties  Cognition Arousal: Alert Behavior During Therapy: WFL for tasks assessed/performed   PT - Cognitive impairments: No apparent impairments                         Following commands: Impaired Following commands impaired: Follows one step commands with increased time  Cueing Cueing Techniques: Verbal cues, Gestural cues, Tactile cues  Exercises Total Joint Exercises Ankle Circles/Pumps: AROM, Strengthening, Both, 10 reps Heel Slides: AAROM, Strengthening, Both, 10 reps Hip ABduction/ADduction: AAROM, Strengthening, Both, 5 reps Straight Leg Raises: AAROM, Strengthening, Both, 5 reps Long Arc Quad: AROM, Strengthening, Both, 5 reps, 10 reps Knee Flexion: AROM, Strengthening, Both, 5 reps, 10 reps Other Exercises Other Exercises: Static sitting unsupported at the EOB for improved core strength and activity tolerance x 8 min    General Comments        Pertinent Vitals/Pain Pain Assessment Pain Assessment:  No/denies pain    Home Living                          Prior Function            PT Goals (current goals can now be found in the care plan section) Progress towards PT goals: Progressing toward goals    Frequency    Min 2X/week      PT Plan      Co-evaluation              AM-PAC PT 6 Clicks Mobility   Outcome Measure  Help needed turning from your back to your side while in a flat bed without using bedrails?: A Lot Help needed moving from lying on your back to sitting on the side of a flat bed without using bedrails?: A Lot Help needed moving to and from a bed to a chair (including a wheelchair)?: Total Help needed standing up from a chair using your arms (e.g., wheelchair or bedside chair)?: Total Help needed to walk in hospital room?: Total Help needed climbing 3-5 steps with a railing? : Total 6 Click Score: 8    End of Session Equipment Utilized During Treatment: Gait belt;Oxygen Activity Tolerance: Patient tolerated treatment well Patient left: in bed;with call bell/phone within reach;with bed alarm set;with nursing/sitter in room Nurse Communication: Mobility status PT Visit Diagnosis: Other abnormalities of gait and mobility (R26.89);Muscle weakness (generalized) (M62.81);Repeated falls (R29.6);Other symptoms and signs involving the nervous system (R29.898)     Time: 8475-8440 PT Time Calculation (min) (ACUTE ONLY): 35 min  Charges:    $Therapeutic Exercise: 8-22 mins $Therapeutic Activity: 8-22 mins PT General Charges $$ ACUTE PT VISIT: 1 Visit                     D. Glendia Bertin PT, DPT 05/19/24, 4:42 PM

## 2024-05-19 NOTE — Plan of Care (Signed)

## 2024-05-19 NOTE — Progress Notes (Signed)
 Rounding Note   Patient Name: Alexandria Sherman Date of Encounter: 05/19/2024  [facility] Cardiologist: Deatrice Cage, MD  Subjective Some confusion this morning. Oriented to person and time. Did not know she was in the hospital. She remains in atrial fibrillation with rate 80-120 bpm on average, up to 150 bpm at times. Remains on 2 L supplemental O2. Remains volume overloaded.   Scheduled Meds:  apixaban   5 mg Oral BID   Chlorhexidine  Gluconate Cloth  6 each Topical QHS   digoxin   0.0625 mg Oral Daily   divalproex   125 mg Oral BID   feeding supplement  237 mL Oral BID BM   furosemide   20 mg Intravenous Daily   latanoprost   1 drop Both Eyes QHS   methIMAzole   5 mg Oral Daily   metoprolol  succinate  75 mg Oral BID   metoprolol  tartrate  2.5 mg Intravenous Once   pantoprazole   40 mg Oral Daily   polyethylene glycol  17 g Oral Daily   QUEtiapine   12.5 mg Oral QPC supper   senna  1 tablet Oral Daily   timolol   1 drop Both Eyes Daily   venlafaxine   75 mg Oral BID   Continuous Infusions:  PRN Meds: acetaminophen  **OR** acetaminophen , clorazepate , ipratropium-albuterol , nystatin , ondansetron  **OR** ondansetron  (ZOFRAN ) IV, mouth rinse   Vital Signs  Vitals:   05/19/24 0619 05/19/24 0735 05/19/24 1000 05/19/24 1101  BP: 99/88 111/75  99/76  Pulse: (!) 105 100  93  Resp: 20 18  18   Temp:  98.7 F (37.1 C)  97.9 F (36.6 C)  TempSrc:      SpO2:  97% 95% 99%  Weight:      Height:        Intake/Output Summary (Last 24 hours) at 05/19/2024 1122 Last data filed at 05/19/2024 0900 Gross per 24 hour  Intake 240 ml  Output 701 ml  Net -461 ml      05/19/2024    4:41 AM 05/18/2024   11:12 AM 05/10/2024    2:38 PM  Last 3 Weights  Weight (lbs) 132 lb 4.8 oz 130 lb 8 oz 136 lb 7.4 oz  Weight (kg) 60.011 kg 59.194 kg 61.9 kg      Telemetry Atrial fibrillation, rate 80-120 bpm on average, up to 150 at times - Personally Reviewed  Physical Exam  GEN: No acute  distress.   Neck: No JVD Cardiac: IRIR, no murmurs, rubs, or gallops.  Respiratory: Clear to auscultation bilaterally. GI: Soft, nontender, non-distended  MS: 1+ LE edema; No deformity. Neuro:  Nonfocal  Psych: Normal affect   Labs High Sensitivity Troponin:   Recent Labs  Lab 05/10/24 1108  TROPONINIHS 16     Chemistry Recent Labs  Lab 05/13/24 0020 05/13/24 0351 05/17/24 0628 05/18/24 0403 05/19/24 0357  NA 138   < > 138 138 135  K 4.1   < > 4.3 3.9 4.4  CL 103   < > 93* 93* 89*  CO2 27   < > 35* 36* 34*  GLUCOSE 121*   < > 78 70 85  BUN 31*   < > 18 18 15   CREATININE 0.46   < > 0.43* 0.33* 0.37*  CALCIUM  9.3   < > 9.4 9.0 9.5  MG  --    < > 1.9 2.3 2.2  PROT 6.3*  --   --   --   --   ALBUMIN 2.3*  --   --   --   --  AST 14*  --   --   --   --   ALT 18  --   --   --   --   ALKPHOS 67  --   --   --   --   BILITOT 0.7  --   --   --   --   GFRNONAA >60   < > >60 >60 >60  ANIONGAP 8   < > 10 9 12    < > = values in this interval not displayed.    Lipids No results for input(s): CHOL, TRIG, HDL, LABVLDL, LDLCALC, CHOLHDL in the last 168 hours.  Hematology Recent Labs  Lab 05/16/24 0452 05/17/24 0628 05/18/24 0403  WBC 12.3* 12.7* 11.4*  RBC 4.15 4.24 4.11  HGB 12.9 13.2 12.5  HCT 42.3 42.9 40.6  MCV 101.9* 101.2* 98.8  MCH 31.1 31.1 30.4  MCHC 30.5 30.8 30.8  RDW 14.2 14.1 13.8  PLT 261 252 239   Thyroid   Recent Labs  Lab 05/18/24 0403  FREET4 0.55*    BNP Recent Labs  Lab 05/13/24 0351 05/15/24 0357 05/17/24 0628  BNP 622.6* 611.0* 227.3*    DDimer  Recent Labs  Lab 05/13/24 0020  DDIMER 0.86*     Radiology  No results found.  Cardiac Studies  Echo 04/2024 1. Left ventricular ejection fraction, by estimation, is 50 to 55%. The  left ventricle has low normal function. The left ventricle has no regional  wall motion abnormalities. Left ventricular diastolic parameters are  indeterminate.   2. Right ventricular systolic  function is normal. The right ventricular  size is normal.   3. The mitral valve is normal in structure. Mild mitral valve  regurgitation.   4. The aortic valve is tricuspid. Aortic valve regurgitation is not  visualized.   5. The inferior vena cava is normal in size with greater than 50%  respiratory variability, suggesting right atrial pressure of 3 mmHg.   Patient Profile   78 y.o. female with a history of coronary artery calcification noted on CT imaging, chronic sinus tachycardia, HTN, HLD, hypothyroidism, chronic venous insufficiency/lymphedema, anxiety, depression, and GERD who was admitted for sepsis secondary to UTI who is being seen for the ongoing management of newly diagnosed atrial fibrillation.   Assessment & Plan   Atrial fibrillation - Newly diagnosed this admission in the setting of sepsis secondary to UTI - Also with hyperthyroidism which is likely contributing, recently started on methimazole  - Did not tolerate IV diltiazem  due to hypotension - Remains in atrial fibrillation with rates largely controlled 80-120 bpm on average - She is continued on metoprolol  succinate 75 mg twice daily and digoxin  0.0625 mg daily - Rate control is limited by low BP - Continue Eliquis  5 mg twice daily in the setting of  CHA2DS2-VASc Score = 5  - Consider DCCV after 3 weeks of uninterrupted anticoagulation if she remains in afib  HFpEF Dilated pulmonary artery Acute hypoxic respiratory failure - Echo this admission with EF low normal. Unable to assess diastolic parameters in the setting of rapid afib. - Dilated pulmonary artery noted on chest CT - Remains on 2 L supplemental O2 - BNP 813 on admission, 227 on recheck 8/23 - Volume status appears to be improving - She is net -3.3 L since admission, although intake is poorly recorded - Continue to monitor kidney function, strict I/Os, and daily weights with ongoing diuresis - Continue IV Lasix  20 mg daily - No SGLT2i given admission  for  UTI  Sepsis secondary to UTI - Antibiotics per IM  Hyperthyroidism - Recently started on methimazole  - Ongoing management per IM  For questions or updates, please contact [facility] Please consult [email or web address] for contact info under     Signed, Lesley LITTIE Maffucci, PA-C  05/19/2024, 11:22 AM

## 2024-05-19 NOTE — Progress Notes (Signed)
 [facility] Progress Note  Patient: Alexandria Sherman    FMW:[id number]  DOA: 05/10/2024     Date of Service: the patient was seen and examined on 05/19/2024  Chief Complaint  Patient presents with   Weakness   Brief hospital course: Alexandria Sherman is a 78 y.o. female with medical history significant of anxiety, depression, GERD, HTN, HLD, subclinical hyperthyroidism who was brought in to [facility] ED by ambulance for generalized weakness and shortness of breath that has worsened over the last week.  Patient lives alone but she does have a caregiver at home.  With EMS her heart rate was noted to have in 160s.  Patient tells me that she has a bilateral lower extremity swelling and chronic skin changes but does not carry the diagnosis of atrial fibrillation or congestive heart failure.  She also complains some dysuria but no fever no chills.  She denies any chest pain or palpitations.  She denies any diarrhea or vomiting.  Her main complaint was she was not able to walk around at her home.   In the ED, patient was found to be in atrial fibrillation with rapid ventricular response, new onset and also patient has positive urine analysis.  She also has a low TSH level which appears to be chronic in nature.  Patient was started on Cardizem  drip in the emergency room and was started on antibiotic.  Patient has a history of blisters with the use of cephalosporins in her vaginal area.   Hospitalist service was consulted for evaluation for admission for sepsis due to urinary tract infection and atrial fibrillation with rapid ventricular response.   Assessment and Plan:  # Paroxysmal Atrial fibrillation  HR improved in the setting of sepsis and hyperthyroidism  CHADS2 DS 2 vasc score 4 (Age 5+sex 1+ HTN 1) S/p Cardizem  IV infusion, weaned off S/p Heparin  IV gtt, transition to Eliquis  on 8/18 8/18 s/p digoxin  0.25 mg IV x 2 doses  8/19 changed Lopressor  25 mg p.o. q6 Continue to monitor on telemetry TTE  LVEF 50 to 55%, no WMA, no significant valvular pathology  Per cardiology  Digoxin , metop succinate, eliquis , will need cardioversion in 3 weeks if remains in afib   # Sepsis due to urinary tract infection Sepsis physiology now resolved  Sepsis criteria POA: tachycardia, tachypneic and leukocytosis with UTI She is allergic to cephalosporin which causes blisters in her vaginal area. S/p Aztreonam , urine culture grew E. coli.  Transitioned to Bactrim  s/p 3 day course  Continues to have mild leukocytosis  Blood culture NGTD Monitor fever curve and WBC count   HFpEF # Acute hypoxic respiratory failure with pulmonary edema Improving  In the setting of afib with RVR.  8/18 s/p Lasix  40 mg x 1 dose given 8/19 Lasix  20 mg IV daily 8/20 IV lasix  20mg  BID  Transitioned to p.o. diuretics Wean O2 as able   #Metabolic alkalosis  Contracture alkalosis in setting of diuresis. Continue with IV diuresis.     # Hypethyroidism Undiagnosed previously, patient is poor historian, she is aware that her thyroid  function was abnl, does not remember taking any medications FT4 1.33, FT3 WNL, TSH 0.027  8/17 started methimazole  15 mg p.o. TID x 2 doses, and BID x 2 doses, followed by daily  8/19 Free T4 level 0.81 gradually decreasing  thyroid  antibodies Neg 8/22 FT4 low  Thyroid  sonogram: Right thyroid  nodule, needs monitoring Endocrinology outpatient referral   Continue methimazole  at reduced dose of 5mg  daily  Macrocytosis without anemia Folate and B12 within normal limits CTM   #Chronic venous insufficiency  #Lymphedema Arterial duplex studies showed no significant blockages  Continues to have significant discoloration and coolness of bilateral feet. Unclear duration of this. Noted 8/19 in notes.  TSH elevated per above, on methimazole .  She does have elevated protein gap: will check SPEP ( no anemia or renal dysfunction) Possible raynauds v acrocyanosis, appears to worsen with prolonged  cold exposure warming feet and rechecking them in the AM. Feet noted to be warm to touch on recent PCP visit.   Keep lower extremities elevated while in bed Compression socks  Continue Lasix  as above Keep feet warm  F/u labs    # Anxiety/depression - Continue her home medications   # Sundowning Continue Seroquel   Glaucoma, continue eyedrops   Body mass index is 22.71 kg/m.  Interventions:  Diet: Heart healthy/carb modified diet DVT Prophylaxis: Eliquis   Advance goals of care discussion: Full code  Family Communication: None at bedside  Disposition:  Pt is from Home, admitted with sepsis due to UTI and A-fib with RVR, HR now improved. Will need SNF.    Subjective: No issues overnight.    Physical Exam:    Constitutional: In no distress.  Cardiovascular: Irregularly irregular. Trace bilateral lower extremity edema  Pulmonary: Non labored breathing on [place], no wheezing or rales.   Abdominal: Soft. Non distended and non tender Musculoskeletal: Normal range of motion.     Neurological: Alert and oriented to person, place, and time. Non focal  Skin: Bilateral feet cool to touch, purpulish in color.    Vitals:   05/19/24 1000 05/19/24 1101 05/19/24 1200 05/19/24 1606  BP:  99/76  119/77  Pulse:  93  85  Resp:  18  18  Temp:  97.9 F (36.6 C)  98 F (36.7 C)  TempSrc:      SpO2: 95% 99% 95% 96%  Weight:      Height:        Intake/Output Summary (Last 24 hours) at 05/19/2024 1918 Last data filed at 05/19/2024 1900 Gross per 24 hour  Intake 480 ml  Output 701 ml  Net -221 ml   Filed Weights   05/10/24 1438 05/18/24 1112 05/19/24 0441  Weight: 61.9 kg 59.2 kg 60 kg    Data Reviewed: I personally reviewed telemetry  CBC: Recent Labs  Lab 05/13/24 0020 05/13/24 0351 05/14/24 0348 05/15/24 0357 05/16/24 0452 05/17/24 0628 05/18/24 0403  WBC 13.2*   < > 12.3* 12.4*  12.4* 12.3* 12.7* 11.4*  NEUTROABS 10.5*  --   --  10.2*  --  10.6* 9.1*  HGB  12.9   < > 12.8 13.3  13.3 12.9 13.2 12.5  HCT 40.5   < > 42.2 44.3  44.1 42.3 42.9 40.6  MCV 97.1   < > 101.7* 100.7*  100.9* 101.9* 101.2* 98.8  PLT 298   < > 246 273  267 261 252 239   < > = values in this interval not displayed.   Basic Metabolic Panel: Recent Labs  Lab 05/13/24 0351 05/14/24 0348 05/15/24 0357 05/16/24 0452 05/17/24 0628 05/18/24 0403 05/19/24 0357  NA 138 139 142 141 138 138 135  K 4.1 4.3 4.0 4.1 4.3 3.9 4.4  CL 102 104 101 97* 93* 93* 89*  CO2 28 27 36* 36* 35* 36* 34*  GLUCOSE 97 68* 82 85 78 70 85  BUN 32* 33* 25* 19 18 18 15   CREATININE 0.58  0.66 0.52 0.55 0.43* 0.33* 0.37*  CALCIUM  9.2 9.2 9.3 9.3 9.4 9.0 9.5  MG 2.1 2.1 2.1 2.1 1.9 2.3 2.2  PHOS 4.2 3.3  --   --   --   --   --     Studies: No results found.    Scheduled Meds:  apixaban   5 mg Oral BID   Chlorhexidine  Gluconate Cloth  6 each Topical QHS   digoxin   0.0625 mg Oral Daily   divalproex   125 mg Oral BID   feeding supplement  237 mL Oral BID BM   furosemide   20 mg Intravenous BID   latanoprost   1 drop Both Eyes QHS   methIMAzole   5 mg Oral Daily   metoprolol  succinate  75 mg Oral BID   metoprolol  tartrate  2.5 mg Intravenous Once   pantoprazole   40 mg Oral Daily   polyethylene glycol  17 g Oral Daily   QUEtiapine   12.5 mg Oral QPC supper   senna  1 tablet Oral Daily   timolol   1 drop Both Eyes Daily   venlafaxine   75 mg Oral BID   Continuous Infusions:   PRN Meds: acetaminophen  **OR** acetaminophen , clorazepate , ipratropium-albuterol , nystatin , ondansetron  **OR** ondansetron  (ZOFRAN ) IV, mouth rinse  Time spent: 35 minutes  Author: Alban Pepper. MD [facility] 05/19/2024 7:18 PM  To reach On-call, see care teams to locate the attending and reach out to them via [email or web address]. If 7PM-7AM, please contact night-coverage If you still have difficulty reaching the attending provider, please page the [facility] (Director on Call) for [facility] on amion for  assistance.

## 2024-05-20 ENCOUNTER — Inpatient Hospital Stay

## 2024-05-20 DIAGNOSIS — I5030 Unspecified diastolic (congestive) heart failure: Secondary | ICD-10-CM | POA: Diagnosis not present

## 2024-05-20 DIAGNOSIS — I4891 Unspecified atrial fibrillation: Secondary | ICD-10-CM | POA: Diagnosis not present

## 2024-05-20 DIAGNOSIS — A419 Sepsis, unspecified organism: Secondary | ICD-10-CM

## 2024-05-20 DIAGNOSIS — I5031 Acute diastolic (congestive) heart failure: Secondary | ICD-10-CM | POA: Diagnosis not present

## 2024-05-20 LAB — COMPREHENSIVE METABOLIC PANEL WITH GFR
ALT: 11 U/L (ref 0–44)
AST: 12 U/L — ABNORMAL LOW (ref 15–41)
Albumin: 2.4 g/dL — ABNORMAL LOW (ref 3.5–5.0)
Alkaline Phosphatase: 52 U/L (ref 38–126)
Anion gap: 12 (ref 5–15)
BUN: 19 mg/dL (ref 8–23)
CO2: 40 mmol/L — ABNORMAL HIGH (ref 22–32)
Calcium: 9.1 mg/dL (ref 8.9–10.3)
Chloride: 86 mmol/L — ABNORMAL LOW (ref 98–111)
Creatinine, Ser: 0.35 mg/dL — ABNORMAL LOW (ref 0.44–1.00)
GFR, Estimated: >60 mL/min (ref >60)
Glucose, Bld: 74 mg/dL (ref 70–99)
Potassium: 3.7 mmol/L (ref 3.5–5.1)
Sodium: 138 mmol/L (ref 135–145)
Total Bilirubin: 0.8 mg/dL (ref 0.0–1.2)
Total Protein: 5.9 g/dL — ABNORMAL LOW (ref 6.5–8.1)

## 2024-05-20 LAB — SEDIMENTATION RATE: Sed Rate: 41 mm/h — ABNORMAL HIGH (ref 0–30)

## 2024-05-20 LAB — C-REACTIVE PROTEIN: CRP: 6.7 mg/dL — ABNORMAL HIGH (ref <1.0)

## 2024-05-20 LAB — MAGNESIUM: Magnesium: 2 mg/dL (ref 1.7–2.4)

## 2024-05-20 LAB — T4, FREE: Free T4: 0.45 ng/dL — ABNORMAL LOW (ref 0.61–1.12)

## 2024-05-20 MED ORDER — FUROSEMIDE 40 MG PO TABS
40.0000 mg | ORAL_TABLET | Freq: Every day | ORAL | Status: DC
  Administered 2024-05-20 – 2024-05-21 (×2): 40 mg via ORAL
  Filled 2024-05-20 (×2): qty 1

## 2024-05-20 MED ORDER — METHIMAZOLE 2.5 MG HALF TABLET
2.5000 mg | ORAL_TABLET | Freq: Every day | ORAL | Status: DC
  Administered 2024-05-21: 2.5 mg via ORAL
  Filled 2024-05-20: qty 1

## 2024-05-20 NOTE — Progress Notes (Signed)
 Occupational Therapy Treatment Patient Details Name: Alexandria Sherman MRN: [id number] DOB: [birth year] Today's Date: 05/20/2024   History of present illness Alexandria Sherman is a 78 y.o. female with medical history significant of anxiety, depression, GERD, HTN, HLD, subclinical hyperthyroidism who was brought in to [facility] ED by ambulance for generalized weakness and worsening shortness of breath.  MD assessment includes: sepsis due to urinary tract infection, acute hypoxic respiratory failure with pulmonary edema, and metabolic alkalosis.   OT comments  Pt seen for OT treatment on this date. Upon arrival to room pt sitting up in bed, agreeable to tx. Pt requires MAXA with heavy cues for all bed mobility, pt tolerated sitting on the EOB for seated oral care ~39mins. Pt required MINA to complete task, presenting with trouble problem solving and sequencing steps. Pt required direct, specific multimodal cues to complete with [facility] physical assistance to assist pt in grasping and orientating tooth brush in her mouth. Pt returned to supine with all needs in reach, eager to rest. Pt making good progress toward goals, will continue to follow POC. Discharge recommendation remains appropriate.        If plan is discharge home, recommend the following:  Two people to help with walking and/or transfers;Two people to help with bathing/dressing/bathroom;Help with stairs or ramp for entrance   Equipment Recommendations  Hoyer lift;Hospital bed;BSC/3in1    Recommendations for Other Services      Precautions / Restrictions Precautions Precautions: Fall Recall of Precautions/Restrictions: Impaired Restrictions Weight Bearing Restrictions Per Provider Order: No       Mobility Bed Mobility Overal bed mobility: Needs Assistance Bed Mobility: Supine to Sit, Sit to Supine     Supine to sit: Max assist Sit to supine: Max assist   General bed mobility comments: MAXA for BLE and trunk control to come to sitting  position on the EOB    Transfers                   General transfer comment: Pt becomes very anxious when metioning standing, pt refused to attempt on this date.     Balance Overall balance assessment: Needs assistance Sitting-balance support: Feet supported Sitting balance-Leahy Scale: Fair Sitting balance - Comments: Steady reaching within BOS                                   ADL either performed or assessed with clinical judgement   ADL Overall ADL's : Needs assistance/impaired Eating/Feeding: Minimal assistance;Sitting   Grooming: Oral care;Wash/dry face;Sitting;Minimal assistance               Lower Body Dressing: Maximal assistance Lower Body Dressing Details (indicate cue type and reason): sitting       Toileting - Clothing Manipulation Details (indicate cue type and reason): Anticipate MAXA for pericare and toilet t/f       General ADL Comments: Donning bil socks while seated on the EOB    Extremity/Trunk Assessment Upper Extremity Assessment Upper Extremity Assessment: LUE deficits/detail;Generalized weakness;RUE deficits/detail (Limited ROM due to shoulder deficits, per discussion with PT pt has chronic arthritis limiting pt UE function) RUE Deficits / Details: Flaccid appearing RUE Coordination: decreased gross motor;decreased fine motor LUE Deficits / Details: Limited shoulder flexion ~ 90degrees LUE Coordination: decreased gross motor;decreased fine motor  Communication Communication Communication: No apparent difficulties Factors Affecting Communication: Hearing impaired   Cognition Arousal: Alert Behavior During Therapy: WFL for tasks assessed/performed Cognition: No family/caregiver present to determine baseline             OT - Cognition Comments: A/Ox1, pt often goes on a tangent about things unrelated to directly asked questions                 Following commands:  Impaired Following commands impaired: Follows one step commands with increased time      Cueing   Cueing Techniques: Verbal cues, Gestural cues, Tactile cues  Exercises Exercises: Other exercises Other Exercises Other Exercises: Edu: Role of OT, hospital delerium prevention       General Comments BLE wrapped    Pertinent Vitals/ Pain       Pain Assessment Pain Assessment: No/denies pain                                                          Frequency  Min 2X/week        Progress Toward Goals  OT Goals(current goals can now be found in the care plan section)  Progress towards OT goals: Progressing toward goals  Acute Rehab OT Goals OT Goal Formulation: With patient Time For Goal Achievement: 05/29/24 Potential to Achieve Goals: Fair ADL Goals Pt Will Perform Grooming: with supervision;with set-up;sitting Pt Will Perform Lower Body Dressing: with mod assist;sitting/lateral leans Pt Will Transfer to Toilet: with max assist;bedside commode   AM-PAC OT 6 Clicks Daily Activity     Outcome Measure   Help from another person eating meals?: None Help from another person taking care of personal grooming?: A Little Help from another person toileting, which includes using toliet, bedpan, or urinal?: A Lot Help from another person bathing (including washing, rinsing, drying)?: A Lot Help from another person to put on and taking off regular upper body clothing?: A Lot Help from another person to put on and taking off regular lower body clothing?: A Lot 6 Click Score: 15    End of Session Equipment Utilized During Treatment: Oxygen  OT Visit Diagnosis: Other abnormalities of gait and mobility (R26.89);Muscle weakness (generalized) (M62.81)   Activity Tolerance Patient tolerated treatment well   Patient Left in bed;with call bell/phone within reach;with bed alarm set             Time: 8543-8472 OT Time Calculation (min): 31  min  Charges: OT General Charges $OT Visit: 1 Visit OT Treatments $Therapeutic Activity: 8-22 mins  Larraine Colas M.S. OTR/L  05/20/24, 3:56 PM

## 2024-05-20 NOTE — Progress Notes (Signed)
 OT Cancellation Note  Patient Details Name: KEYSHA DAMEWOOD MRN: [id number] DOB: Jul 25, 1946   Cancelled Treatment:    Reason Eval/Treat Not Completed: Patient at procedure or test/ unavailable. Pt off unit during attempted OT treatment session, will re attempt on next available date/time.   Larraine Colas M.S. OTR/L  05/20/24, 12:12 PM

## 2024-05-20 NOTE — Progress Notes (Signed)
 Physical Therapy Treatment Patient Details Name: Alexandria Sherman MRN: [id number] DOB: [birth year] Today's Date: 05/20/2024   History of Present Illness Alexandria Sherman is a 78 y.o. female with medical history significant of anxiety, depression, GERD, HTN, HLD, subclinical hyperthyroidism who was brought in to [facility] ED by ambulance for generalized weakness and worsening shortness of breath.  MD assessment includes: sepsis due to urinary tract infection, acute hypoxic respiratory failure with pulmonary edema, and metabolic alkalosis.    PT Comments  Pt was pleasant and motivated to participate with bed mobility tasks and below therex during the session but declined to attempt to stand even with +2 assist for fear of falling.  Max encouragement as well as education on importance of weight bearing activity provided with pt continuing to decline.  Pt required extensive assistance with bed mobility tasks but was steady in sitting at the EOB even with only one UE support.  Pt reported no adverse symptoms during the session with SpO2 and HR WNL throughout 2LO2/min. Pt will benefit from continued PT services upon discharge to safely address deficits listed in patient problem list for decreased caregiver assistance and eventual return to PLOF.         If plan is discharge home, recommend the following: Two people to help with walking and/or transfers;Two people to help with bathing/dressing/bathroom   Can travel by private vehicle     No  Equipment Recommendations  Wheelchair (measurements PT);Wheelchair cushion (measurements PT);Hoyer lift;Other (comment)    Recommendations for Other Services       Precautions / Restrictions Precautions Precautions: Fall Recall of Precautions/Restrictions: Impaired Restrictions Weight Bearing Restrictions Per Provider Order: No     Mobility  Bed Mobility Overal bed mobility: Needs Assistance Bed Mobility: Supine to Sit, Sit to Supine     Supine to sit: Max  assist Sit to supine: Max assist   General bed mobility comments: Max A for BLE and trunk control during sup to/from sit    Transfers                   General transfer comment: Pt refused to attempt to stand due to fear of falling even with +2 assist available and max encouragement    Ambulation/Gait                   Stairs             Wheelchair Mobility     Tilt Bed    Modified Rankin (Stroke Patients Only)       Balance Overall balance assessment: Needs assistance Sitting-balance support: Feet unsupported, Single extremity supported Sitting balance-Leahy Scale: Good                                      Communication Communication Communication: Impaired Factors Affecting Communication: Hearing impaired  Cognition Arousal: Alert Behavior During Therapy: WFL for tasks assessed/performed   PT - Cognitive impairments: No apparent impairments                         Following commands: Impaired Following commands impaired: Follows one step commands with increased time    Cueing Cueing Techniques: Verbal cues, Gestural cues, Tactile cues  Exercises Total Joint Exercises Ankle Circles/Pumps: AROM, Strengthening, Both, 10 reps, 15 reps Quad Sets: Strengthening, Both, 5 reps, 10 reps Gluteal Sets: Strengthening, Both, 5 reps, 10  reps Towel Squeeze: Strengthening, Both, 10 reps Hip ABduction/ADduction: AAROM, Strengthening, Both, 10 reps Straight Leg Raises: AAROM, Strengthening, Both, 10 reps Long Arc Quad: AROM, Strengthening, Both, 5 reps, 10 reps Knee Flexion: AROM, Strengthening, Both, 5 reps, 10 reps Other Exercises Other Exercises: Pt education provided on physiological benefits of weight bearing activity and risk of atrophy/deconditioning with refusal to attempt to stand/ambulate    General Comments General comments (skin integrity, edema, etc.): BLE wrapped      Pertinent Vitals/Pain Pain  Assessment Pain Assessment: No/denies pain    Home Living                          Prior Function            PT Goals (current goals can now be found in the care plan section) Progress towards PT goals: PT to reassess next treatment    Frequency    Min 2X/week      PT Plan      Co-evaluation              AM-PAC PT 6 Clicks Mobility   Outcome Measure  Help needed turning from your back to your side while in a flat bed without using bedrails?: A Lot Help needed moving from lying on your back to sitting on the side of a flat bed without using bedrails?: Total Help needed moving to and from a bed to a chair (including a wheelchair)?: Total Help needed standing up from a chair using your arms (e.g., wheelchair or bedside chair)?: Total Help needed to walk in hospital room?: Total Help needed climbing 3-5 steps with a railing? : Total 6 Click Score: 7    End of Session Equipment Utilized During Treatment: Gait belt;Oxygen Activity Tolerance: Patient tolerated treatment well Patient left: in bed;with call bell/phone within reach;with bed alarm set Nurse Communication: Mobility status PT Visit Diagnosis: Other abnormalities of gait and mobility (R26.89);Muscle weakness (generalized) (M62.81);Repeated falls (R29.6);Other symptoms and signs involving the nervous system (R29.898)     Time: 8447-8381 PT Time Calculation (min) (ACUTE ONLY): 26 min  Charges:    $Therapeutic Exercise: 8-22 mins $Therapeutic Activity: 8-22 mins PT General Charges $$ ACUTE PT VISIT: 1 Visit                     D. Glendia Bertin PT, DPT 05/20/24, 5:04 PM

## 2024-05-20 NOTE — Consult Note (Addendum)
 WOC Nurse Consult Note: Vascular team following for assessment and plan of care.  Requested to apply bilat Una boots.  Toes are dark purple and dusky prior to the application.  Pt states, they are always that way. ABI was performed within 6 months and the results were normal.  Bilat legs with edema and dark red-purple skin, no open wounds or drainage.  Applied bilat [employer] and coban as requested.  Topical treatment orders provided for bedside nurses as follows: Leave bilat Una boots in place; WOC will change Q Tues while in the hospital.  Thank-you,  Stephane Fought MSN, RN, CWOCN, CWCN-AP, CNS Contact Mon-Fri 0700-1500: [phone]

## 2024-05-20 NOTE — Plan of Care (Signed)

## 2024-05-20 NOTE — Plan of Care (Signed)
  Problem: Education: Goal: Knowledge of disease or condition will improve Outcome: Progressing   Problem: Activity: Goal: Ability to tolerate increased activity will improve Outcome: Progressing   Problem: Health Behavior/Discharge Planning: Goal: Ability to manage health-related needs will improve Outcome: Progressing   Problem: Nutrition: Goal: Adequate nutrition will be maintained Outcome: Progressing   Problem: Coping: Goal: Level of anxiety will decrease Outcome: Progressing

## 2024-05-20 NOTE — Progress Notes (Signed)
 [facility] Progress Note  Patient: Alexandria Sherman    FMW:[id number]  DOA: 05/10/2024     Date of Service: the patient was seen and examined on 05/20/2024  Chief Complaint  Patient presents with   Weakness   Brief hospital course: Alexandria Sherman is a 78 y.o. female with medical history significant of anxiety, depression, GERD, HTN, HLD, subclinical hyperthyroidism who was brought in to [facility] ED by ambulance for generalized weakness and shortness of breath that has worsened over the last week.  Patient lives alone but she does have a caregiver at home.  With EMS her heart rate was noted to have in 160s.  Patient tells me that she has a bilateral lower extremity swelling and chronic skin changes but does not carry the diagnosis of atrial fibrillation or congestive heart failure.  She also complains some dysuria but no fever no chills.  She denies any chest pain or palpitations.  She denies any diarrhea or vomiting.  Her main complaint was she was not able to walk around at her home.   In the ED, patient was found to be in atrial fibrillation with rapid ventricular response, new onset and also patient has positive urine analysis.  She also has a low TSH level which appears to be chronic in nature.  Patient was started on Cardizem  drip in the emergency room and was started on antibiotic.  Patient has a history of blisters with the use of cephalosporins in her vaginal area.   Hospitalist service was consulted for evaluation for admission for sepsis due to urinary tract infection and atrial fibrillation with rapid ventricular response.   Assessment and Plan:  # Paroxysmal Atrial fibrillation  HR improved in the setting of sepsis and hyperthyroidism  CHADS2 DS 2 vasc score 4 (Age 25+sex 1+ HTN 1) S/p Cardizem  IV infusion, weaned off S/p Heparin  IV gtt, transition to Eliquis  on 8/18 8/18 s/p digoxin  0.25 mg IV x 2 doses  8/19 changed Lopressor  25 mg p.o. q6 Continue to monitor on telemetry TTE  LVEF 50 to 55%, no WMA, no significant valvular pathology  Per cardiology  Digoxin , metop succinate, eliquis , will need cardioversion in 3 weeks   # Sepsis due to urinary tract infection Sepsis physiology now resolved  Sepsis criteria POA: tachycardia, tachypneic and leukocytosis with UTI She is allergic to cephalosporin which causes blisters in her vaginal area. S/p Aztreonam , urine culture grew E. coli.  Transitioned to Bactrim  s/p 3 day course  Continues to have mild leukocytosis  Blood culture NGTD Monitor fever curve and WBC count   HFpEF # Acute hypoxic respiratory failure with pulmonary edema Improving  In the setting of afib with RVR.  8/18 s/p Lasix  40 mg x 1 dose given 8/19 Lasix  20 mg IV daily 8/20 IV lasix  20mg  BID  Continue p.o. diuretics Wean O2 as able   #Metabolic alkalosis  Contracture alkalosis in setting of diuresis. Trasnitioend to PO diuresis      Latest Ref Rng & Units 05/20/2024    4:15 AM 05/19/2024    3:57 AM 05/18/2024    4:03 AM  BMP  Glucose 70 - 99 mg/dL 74  85  70   BUN 8 - 23 mg/dL 19  15  18    Creatinine 0.44 - 1.00 mg/dL 9.64  9.62  9.66   Sodium 135 - 145 mmol/L 138  135  138   Potassium 3.5 - 5.1 mmol/L 3.7  4.4  3.9   Chloride 98 - 111  mmol/L 86  89  93   CO2 22 - 32 mmol/L 40  34  36   Calcium  8.9 - 10.3 mg/dL 9.1  9.5  9.0     # Hypethyroidism Undiagnosed previously, patient is poor historian, she is aware that her thyroid  function was abnl, does not remember taking any medications FT4 1.33, FT3 WNL, TSH 0.027  8/17 started methimazole  15 mg p.o. TID x 2 doses, and BID x 2 doses, followed by daily  8/19 Free T4 level 0.81 gradually decreasing  thyroid  antibodies Neg 8/22 FT4 low  Thyroid  sonogram: Right thyroid  nodule, needs monitoring Endocrinology outpatient referral   Continue methimazole  at reduced dose of 5mg  daily, will changed this to half dose given low FT4   Macrocytosis without anemia Folate and B12 within normal  limits CTM   #Chronic venous insufficiency  #Lymphedema Arterial duplex studies showed no significant blockages  Continues to have significant discoloration and coolness of bilateral feet. Unclear duration of this.   Consulted [facility] for recommendations re further w/u. Feel this is all related to venous insffuciency. Venous duplex with no DVT.  -Given severe arthritis of hand and her help at home has had difficulty with placing cmpression stockings will place unna boots.  -She willl need to f/u with her PCP/[facility] clinic     # Anxiety/depression - Continue her home medications   # Sundowning Continue Seroquel   Glaucoma, continue eyedrops   Body mass index is 22.28 kg/m.  Interventions:  Diet: Heart healthy/carb modified diet DVT Prophylaxis: Eliquis   Advance goals of care discussion: Full code  Family Communication: None at bedside  Disposition:  Pt can likely d/c in the AM. Will need HH equipment delivered. She has hired help who can assist her with iadl and adls. She would not like to go to snf.    Subjective: No issues overnight.    Physical Exam:    Physical Exam  Constitutional: In no distress.  Cardiovascular: Normal rate, regular rhythm. 1+ bilateral lower extremity edema  Pulmonary: Non labored breathing on , no wheezing or rales.   Abdominal: Soft. Non distended and non tender Musculoskeletal: Normal range of motion.     Neurological: Alert and oriented to person, place, and time. Non focal  Skin: Bilateral feet cool to touch purple in color.     Vitals:   05/20/24 1400 05/20/24 1402 05/20/24 1438 05/20/24 1538  BP:    106/85  Pulse: 90 94 89 94  Resp: (!) 33 (!) 28 (!) 26 18  Temp:    (!) 97.5 F (36.4 C)  TempSrc:      SpO2: 90% (!) 88% 100% 91%  Weight:      Height:        Intake/Output Summary (Last 24 hours) at 05/20/2024 1821 Last data filed at 05/20/2024 1755 Gross per 24 hour  Intake 240 ml  Output 1150 ml  Net -910 ml   Filed  Weights   05/18/24 1112 05/19/24 0441 05/20/24 0500  Weight: 59.2 kg 60 kg 58.9 kg    Data Reviewed: I personally reviewed telemetry  CBC: Recent Labs  Lab 05/14/24 0348 05/15/24 0357 05/16/24 0452 05/17/24 0628 05/18/24 0403  WBC 12.3* 12.4*  12.4* 12.3* 12.7* 11.4*  NEUTROABS  --  10.2*  --  10.6* 9.1*  HGB 12.8 13.3  13.3 12.9 13.2 12.5  HCT 42.2 44.3  44.1 42.3 42.9 40.6  MCV 101.7* 100.7*  100.9* 101.9* 101.2* 98.8  PLT 246 273  267 261 252  239   Basic Metabolic Panel: Recent Labs  Lab 05/14/24 0348 05/15/24 0357 05/16/24 0452 05/17/24 0628 05/18/24 0403 05/19/24 0357 05/20/24 0415  NA 139   < > 141 138 138 135 138  K 4.3   < > 4.1 4.3 3.9 4.4 3.7  CL 104   < > 97* 93* 93* 89* 86*  CO2 27   < > 36* 35* 36* 34* 40*  GLUCOSE 68*   < > 85 78 70 85 74  BUN 33*   < > 19 18 18 15 19   CREATININE 0.66   < > 0.55 0.43* 0.33* 0.37* 0.35*  CALCIUM  9.2   < > 9.3 9.4 9.0 9.5 9.1  MG 2.1   < > 2.1 1.9 2.3 2.2 2.0  PHOS 3.3  --   --   --   --   --   --    < > = values in this interval not displayed.    Studies: US  Venous Img Lower Bilateral (DVT) Result Date: 05/20/2024 CLINICAL DATA:  Lower extremity edema and reported history of prior DVT. EXAM: BILATERAL LOWER EXTREMITY VENOUS DOPPLER ULTRASOUND TECHNIQUE: Gray-scale sonography with graded compression, as well as color Doppler and duplex ultrasound were performed to evaluate the lower extremity deep venous systems from the level of the common femoral vein and including the common femoral, femoral, profunda femoral, popliteal and calf veins including the posterior tibial, peroneal and gastrocnemius veins when visible. The superficial great saphenous vein was also interrogated. Spectral Doppler was utilized to evaluate flow at rest and with distal augmentation maneuvers in the common femoral, femoral and popliteal veins. COMPARISON:  None Available. FINDINGS: RIGHT LOWER EXTREMITY Common Femoral Vein: No evidence of  thrombus. Normal compressibility, respiratory phasicity and response to augmentation. Saphenofemoral Junction: No evidence of thrombus. Normal compressibility and flow on color Doppler imaging. Profunda Femoral Vein: No evidence of thrombus. Normal compressibility and flow on color Doppler imaging. Femoral Vein: No evidence of thrombus. Normal compressibility, respiratory phasicity and response to augmentation. Popliteal Vein: No evidence of thrombus. Normal compressibility, respiratory phasicity and response to augmentation. Calf Veins: Right calf veins are very difficult to visualize and not able to be adequately assessed on the study. Superficial Great Saphenous Vein: No evidence of thrombus. Normal compressibility. Venous Reflux:  None. Other Findings: No evidence of superficial thrombophlebitis or abnormal fluid collection. LEFT LOWER EXTREMITY Common Femoral Vein: No evidence of thrombus. Normal compressibility, respiratory phasicity and response to augmentation. Saphenofemoral Junction: No evidence of thrombus. Normal compressibility and flow on color Doppler imaging. Profunda Femoral Vein: No evidence of thrombus. Normal compressibility and flow on color Doppler imaging. Femoral Vein: No evidence of thrombus. Normal compressibility, respiratory phasicity and response to augmentation. Popliteal Vein: No evidence of thrombus. Normal compressibility, respiratory phasicity and response to augmentation. Calf Veins: No evidence of thrombus. Normal compressibility and flow on color Doppler imaging. Superficial Great Saphenous Vein: No evidence of thrombus. Normal compressibility. Venous Reflux:  None. Other Findings: No evidence of superficial thrombophlebitis. Small complex Baker's cyst of the popliteal fossa measures approximately 2.9 x 1.2 x 1.4 cm. IMPRESSION: 1. No evidence of deep venous thrombosis in either lower extremity. 2. Inability to adequately assess the right calf veins on the current study. 3. Small  complex Baker's cyst of the left popliteal fossa. Electronically Signed   By: Marcey Moan M.D.   On: 05/20/2024 13:50      Scheduled Meds:  apixaban   5 mg Oral BID   Chlorhexidine  Gluconate  Cloth  6 each Topical QHS   digoxin   0.0625 mg Oral Daily   divalproex   125 mg Oral BID   feeding supplement  237 mL Oral BID BM   furosemide   40 mg Oral Daily   latanoprost   1 drop Both Eyes QHS   methIMAzole   5 mg Oral Daily   metoprolol  succinate  75 mg Oral BID   metoprolol  tartrate  2.5 mg Intravenous Once   pantoprazole   40 mg Oral Daily   polyethylene glycol  17 g Oral Daily   QUEtiapine   12.5 mg Oral QPC supper   senna  1 tablet Oral Daily   timolol   1 drop Both Eyes Daily   venlafaxine   75 mg Oral BID   Continuous Infusions:   PRN Meds: acetaminophen  **OR** acetaminophen , clorazepate , ipratropium-albuterol , nystatin , ondansetron  **OR** ondansetron  (ZOFRAN ) IV, mouth rinse  Time spent: 35 minutes  Author: Alban Pepper. MD [facility] 05/20/2024 6:21 PM  To reach On-call, see care teams to locate the attending and reach out to them via [email or web address]. If 7PM-7AM, please contact night-coverage If you still have difficulty reaching the attending provider, please page the [facility] (Director on Call) for [facility] on amion for assistance.

## 2024-05-20 NOTE — Progress Notes (Signed)
 SATURATION QUALIFICATIONS: (This note is used to comply with regulatory documentation for home oxygen)  Patient Saturations on Room Air at Rest =  88%  Patient Saturations on  2 Liters of oxygen =97  Please briefly explain why patient needs home oxygen:  Pt is non ambulatory. Oxygen saturation test performed at rest showed a drop in SpO2 to 88% on room air. Oxygen reapplied at 2L and oxygen saturation improved to 100%.

## 2024-05-20 NOTE — Progress Notes (Signed)
 O2 test completed without O2 for desaturations.  Pt desat to 88.

## 2024-05-20 NOTE — Consult Note (Signed)
 Hospital Consult    Reason for Consult:  Bilateral lower extremity Discoloration  Requesting Physician:  Dr Alban Pepper MD  MRN #:  [id number]  History of Present Illness: This is a 78 y.o. female with medical history significant of anxiety, depression, GERD, HTN, HLD, subclinical hyperthyroidism who was brought in to [facility] ED by ambulance for generalized weakness and shortness of breath that has worsened over the last week. Patient lives alone but she does have a caregiver at home. With EMS her heart rate was noted to have in 160s. Patient tells me that she has a bilateral lower extremity swelling and chronic skin changes but does not carry the diagnosis of atrial fibrillation or congestive heart failure.  Patient underwent arterial duplex ultrasounds which were negative for significant arterial occlusive disease in either of her lower extremities.  Vascular surgery was consulted to evaluate.  Past Medical History:  Diagnosis Date   Acute lateral meniscus tear of right knee    Anxiety disorder, unspecified    Cataract [facility]   Clotting disorder (HCC) blood circulation/veins in feet and lower legs   Depression 1978   Diverticulosis of colon 04/2005   GERD (gastroesophageal reflux disease)    [facility]   Hypertension    Hyperthyroidism    Dr Damian   Impaired fasting glucose    Low TSH level    chronic low, but euthyroid   Microscopic hematuria    RBBB    S/p left hip fracture    Sleep disturbance    Subclinical hyperthyroidism    unspecified   Tibia fracture 05/2010   left    Past Surgical History:  Procedure Laterality Date   ABDOMINAL HYSTERECTOMY  1988   hysterectomy/ BSO for endometriosis   BREAST BIOPSY Right 1992   neg   BREAST EXCISIONAL BIOPSY Right 1988   benign per pt   INTRAMEDULLARY (IM) NAIL INTERTROCHANTERIC Left 01/15/2022   Procedure: INTRAMEDULLARY (IM) NAIL INTERTROCHANTRIC;  Surgeon: Cleotilde Barrio, MD;  Location: [facility]  ORS;  Service: Orthopedics;  Laterality: Left;   KNEE ARTHROSCOPY  6/12   left--Dr Ernie    Allergies  Allergen Reactions   Doxycycline  Other (See Comments)    Hallucinations, nocturnal:  In the night felt like she was flipping in the bed.   Augmentin [Amoxicillin-Pot Clavulanate] Other (See Comments)    Pt cannot remember   Cephalexin     REACTION: vaginal blisters Tolerates penicillins   Codeine Phosphate     REACTION: vomiting   Promethazine Hcl     REACTION: nerves    Prior to Admission medications   Medication Sig Start Date End Date Taking? Authorizing Provider  clorazepate  (TRANXENE ) 3.75 MG tablet TAKE 1 TABLET BY MOUTH 3 TIMES DAILY AS NEEDED FOR ANXIETY. 01/24/24  Yes Jimmy Charlie FERNS, MD  diphenhydrAMINE  (BENADRYL ) 25 MG tablet Take 25 mg by mouth every 6 (six) hours as needed.   Yes [provider]  divalproex  (DEPAKOTE ) 125 MG DR tablet TAKE 1 TABLET BY MOUTH TWICE DAILY 09/17/23  Yes Letvak, Richard I, MD  ibuprofen (ADVIL,MOTRIN) 200 MG tablet Take 200 mg by mouth every 6 (six) hours as needed.   Yes [provider]  latanoprost  (XALATAN ) 0.005 % ophthalmic solution Place 1 drop into both eyes at bedtime. Patient taking differently: Place 1 drop into both eyes at bedtime. Does not take regularly - difficulty administering by herself 01/15/12  Yes [provider]  meclizine  (ANTIVERT ) 25 MG tablet TAKE ONE TABLET  DAILY AS NEEDED 12/12/22  Yes Jimmy Charlie FERNS, MD  mometasone  (ELOCON ) 0.1 % cream Apply 1 Application topically 2 (two) times daily as needed (itching). 12/12/22  Yes Jimmy Charlie FERNS, MD  nystatin  (MYCOSTATIN /NYSTOP ) powder Apply 1 Application topically 3 (three) times daily. 04/04/24  Yes [provider]  omeprazole  (PRILOSEC ) 20 MG capsule TAKE 1 CAPSULE BY MOUTH ONCE DAILY Patient taking differently: Take 20 mg by mouth daily. Takes as needed 11/12/23  Yes Jimmy Charlie FERNS, MD  polyethylene glycol (MIRALAX  / GLYCOLAX ) 17 g  packet Take 17 g by mouth daily as needed for mild constipation. 01/18/22  Yes Alexander, Natalie, DO  timolol  (TIMOPTIC ) 0.5 % ophthalmic solution Place 1 drop into both eyes daily. Patient taking differently: Place 1 drop into both eyes daily. Does not take regularly   Yes [provider]  venlafaxine  (EFFEXOR ) 75 MG tablet TAKE ONE TABLET BY MOUTH TWICE DAILY 10/19/23  Yes Letvak, Richard I, MD  Calcium  Carbonate (CALCIUM  500 PO) Take one by mouth twice a day   07/17/19  [provider]    Social History   Socioeconomic History   Marital status: Divorced    Spouse name: Not on file   Number of children: 0   Years of education: 16   Highest education level: Bachelor's degree (e.g., BA, AB, BS)  Occupational History   Occupation: Teacher--retired  Tobacco Use   Smoking status: Never    Passive exposure: Past   Smokeless tobacco: Never  Vaping Use   Vaping status: Never Used  Substance and Sexual Activity   Alcohol use: Yes    Comment: Occasional   Drug use: Not on file   Sexual activity: Not Currently  Other Topics Concern   Not on file  Social History Narrative   Has living will   Both sisters have health care POA--- forms in chart   Now has DNR   No tube feeds if cognitively unaware   Social Drivers of Health   Financial Resource Strain: Low Risk  (03/31/2024)   Overall Financial Resource Strain (CARDIA)    Difficulty of Paying Living Expenses: Not hard at all  Food Insecurity: Food Insecurity Present (05/10/2024)   Hunger Vital Sign    Worried About Running Out of Food in the Last Year: Sometimes true    Ran Out of Food in the Last Year: Sometimes true  Transportation Needs: No Transportation Needs (05/10/2024)   PRAPARE - Administrator, Civil Service (Medical): No    Lack of Transportation (Non-Medical): No  Physical Activity: Inactive (03/31/2024)   Exercise Vital Sign    Days of Exercise per Week: 0 days    Minutes of Exercise per  Session: 0 min  Stress: Stress Concern Present (03/31/2024)   [employer] of Occupational Health - Occupational Stress Questionnaire    Feeling of Stress: Rather much  Social Connections: Socially Isolated (05/10/2024)   Social Connection and Isolation Panel    Frequency of Communication with Friends and Family: More than three times a week    Frequency of Social Gatherings with Friends and Family: Once a week    Attends Religious Services: Never    Database administrator or Organizations: No    Attends Banker Meetings: Never    Marital Status: Divorced  Catering manager Violence: Not At Risk (05/10/2024)   Humiliation, Afraid, Rape, and Kick questionnaire    Fear of Current or Ex-Partner: No    Emotionally Abused: No  Physically Abused: No    Sexually Abused: No     Family History  Problem Relation Age of Onset   Cancer Mother        lung   Diabetes Mother    Depression Mother    Depression Sister    Parkinson's disease Sister    Cancer Sister    Depression Sister    Stroke Other    Heart attack Other    Cancer Other    Dementia Other    Breast cancer Neg Hx    Thyroid  disease Neg Hx     ROS: Otherwise negative unless mentioned in HPI  Physical Examination  Vitals:   05/20/24 0400 05/20/24 0729  BP: 91/64 105/76  Pulse:  86  Resp: 19 16  Temp:  98.6 F (37 C)  SpO2:  96%   Body mass index is 22.28 kg/m.  General:  WDWN in NAD Gait: Not observed HENT: WNL, normocephalic Pulmonary: normal non-labored breathing, without Rales, rhonchi,  wheezing Cardiac: irregular, New Onset Atrial Fibrillation, without  Murmurs, rubs or gallops; without carotid bruits Abdomen: Positive bowel sounds throughout, soft, NT/ND, no masses Skin: without rashes Vascular Exam/Pulses: Positive doppler pulses to bilateral lower extremities. Warm to touch. Noted +2 edema with discoloration to toes, feet and calves.  Extremities: without ischemic changes, without  Gangrene , without cellulitis; with open wounds;  Musculoskeletal: no muscle wasting or atrophy  Neurologic: A&O X 3;  No focal weakness or paresthesias are detected; speech is fluent/normal Psychiatric:  The pt has Normal affect. Lymph:  Unremarkable  CBC    Component Value Date/Time   WBC 11.4 (H) 05/18/2024 0403   RBC 4.11 05/18/2024 0403   HGB 12.5 05/18/2024 0403   HCT 40.6 05/18/2024 0403   PLT 239 05/18/2024 0403   MCV 98.8 05/18/2024 0403   MCH 30.4 05/18/2024 0403   MCHC 30.8 05/18/2024 0403   RDW 13.8 05/18/2024 0403   LYMPHSABS 1.1 05/18/2024 0403   MONOABS 1.1 (H) 05/18/2024 0403   EOSABS 0.1 05/18/2024 0403   BASOSABS 0.0 05/18/2024 0403    BMET    Component Value Date/Time   NA 138 05/20/2024 0415   K 3.7 05/20/2024 0415   CL 86 (L) 05/20/2024 0415   CO2 40 (H) 05/20/2024 0415   GLUCOSE 74 05/20/2024 0415   BUN 19 05/20/2024 0415   CREATININE 0.35 (L) 05/20/2024 0415   CALCIUM  9.1 05/20/2024 0415   GFRNONAA >60 05/20/2024 0415   GFRAA >60 08/07/2017 0450    COAGS: Lab Results  Component Value Date   INR 1.1 05/10/2024   INR 1.0 01/15/2022     Non-Invasive Vascular Imaging:    LOWER EXTREMITY DOPPLER STUDY   Patient Name:  Alexandria Sherman  Date of Exam:   06/19/2023  Medical Rec #: [id number]        Accession #:    [id number]  Date of Birth: 05-29-1946        Patient Gender: F  Patient Age:   56 years  Exam Location:  [facility]  Procedure:      VAS US  ABI WITH/WO TBI  Referring Phys: Darron Deatrice FELIX MD    ---------------------------------------------------------------------------  -----    Indications: Patient denied leg pain. Severe edema bilaterally and a h/o  venous              insufficiency   High Risk Factors: Hypertension, no history of smoking.     Comparison Study: Arterial Doppler on 11/08/18 was normal  Performing Technologist: Fairy Kuba RVT     Examination Guidelines: A complete evaluation includes at minimum,  Doppler  waveform signals and systolic blood pressure reading at the level of  bilateral  brachial, anterior tibial, and posterior tibial arteries, when vessel  segments  are accessible. Bilateral testing is considered an integral part of a  complete  examination. Photoelectric Plethysmograph (PPG) waveforms and toe systolic  pressure readings are included as required and additional duplex testing  as  needed. Limited examinations for reoccurring indications may be performed  as  noted.     ABI Findings:  +---------+------------------+-----+---------+----------------+  Right   Rt Pressure (mmHg)IndexWaveform Comment           +---------+------------------+-----+---------+----------------+  Brachial 111                    triphasic                  +---------+------------------+-----+---------+----------------+  PTA     171               1.44 triphasicnon-compressible  +---------+------------------+-----+---------+----------------+  PERO    154               1.29 triphasic                  +---------+------------------+-----+---------+----------------+  DP      144               1.21 triphasic                  +---------+------------------+-----+---------+----------------+  Great Toe65                0.55 Abnormal                   +---------+------------------+-----+---------+----------------+   +---------+------------------+-----+---------+-------+  Left    Lt Pressure (mmHg)IndexWaveform Comment  +---------+------------------+-----+---------+-------+  Brachial 119                    triphasic         +---------+------------------+-----+---------+-------+  PTA     144               1.21 triphasic         +---------+------------------+-----+---------+-------+  PERO    139               1.25 triphasic         +---------+------------------+-----+---------+-------+  DP      132               1.11 triphasic          +---------+------------------+-----+---------+-------+  [place]                0.82 Abnormal          +---------+------------------+-----+---------+-------+   +-------+-----------+-----------+------------+------------+  ABI/TBIToday's ABIToday's TBIPrevious ABIPrevious TBI  +-------+-----------+-----------+------------+------------+  Right 1.29       0.55       1.22        0.78          +-------+-----------+-----------+------------+------------+  Left  1.25       0.82       1.11        0.97          +-------+-----------+-----------+------------+------------+        Right ABIs appear essentially unchanged compared to prior study on 05/2022.  Left ABIs appear increased compared to prior study on 05/2022.    Summary:  Right: Resting  right ankle-brachial index is within normal range. The  right toe-brachial index is abnormal.   Left: Resting left ankle-brachial index is within normal range. The left  toe-brachial index is normal.   EXAM:05/15/24 BILATERAL LOWER EXTREMITY ARTERIAL DUPLEX SCAN   TECHNIQUE: Gray-scale sonography as well as color Doppler and duplex ultrasound was performed to evaluate the arteries of both lower extremities including the common, superficial and profunda femoral arteries, popliteal artery and calf arteries.   COMPARISON:  None Available.   FINDINGS: Right Lower Extremity   Inflow: Normal common femoral arterial waveforms and velocities. No evidence of inflow (aortoiliac) disease.   Outflow: Normal profunda femoral, superficial femoral and popliteal arterial waveforms and velocities. No focal elevation of the PSV to suggest stenosis. Mild calcified plaque in the right SFA.   Runoff: Triphasic posterior tibial and biphasic anterior tibial waveforms with normal velocities.   Left Lower Extremity   Inflow: Normal common femoral arterial waveforms and velocities. No evidence of inflow (aortoiliac) disease.   Outflow:  Normal profunda femoral, superficial femoral and popliteal arterial waveforms and velocities. No focal elevation of the PSV to suggest stenosis. Mild calcified plaque in the left SFA.   Runoff: Biphasic posterior tibial and triphasic anterior tibial waveforms with normal velocities.   IMPRESSION: No evidence of significant arterial occlusive disease in either lower extremity. Mild calcified plaque in bilateral superficial femoral arteries.  Venous Ultrasounds Bilateral lower extremities Ordered.    Statin:  No. Beta Blocker:  No. Aspirin :  No. ACEI:  No. ARB:  No. CCB use:  No Other antiplatelets/anticoagulants:  No.    ASSESSMENT/PLAN: This is a 78 y.o. female who presents to [facility] emergency department via EMS for weakness and shortness of breath that had worsened over the past week.  Upon workup she was noted to be in new onset A-fib with RVR at heart rate of 160.  She was also positive on a urinalysis for a UTI.  Admitted to the hospital for sepsis with new onset of atrial fibrillation.  Upon workup she was noted to have bilateral lower extremity discoloration.  Arterial ultrasounds were done which were negative for any major occlusions.  She did have arterial ultrasounds with ABIs done a year ago.  These were all normal as well.  She had excellent ABIs bilaterally with triphasic pulses.  Vascular surgery at this time does not recommend any intervention.  However vascular surgery recommends getting venous Doppler ultrasounds of her bilateral lower extremities assessing for DVTs and reflux.  It appears she has some chronic venous insufficiency with blood pooling changing the color of her feet and calves.  Compression rest with elevation will help alleviate some of the venous congestion.  I also noted a wound/skin tear to the dorsal side of her right foot.  I would recommend at this time after the venous ultrasounds are completed the patient be placed into Unna boot wraps and continue  current antibiotics.  I discussed the case with Dr. Cordella Shawl MD and he agrees with the plan.    Alexandria Sherman [facility] 05/20/2024 8:51 AM

## 2024-05-20 NOTE — Progress Notes (Signed)
 Rounding Note   Patient Name: Alexandria Sherman Date of Encounter: 05/20/2024  [facility] Cardiologist: Deatrice Cage, MD  Subjective Patient denies chest pain and shortness of breath. She remains in rate controlled atrial fibrillation. Net output yesterday -650 mL, does not seem accurate. She is net -3.4 L since admission.   Scheduled Meds:  apixaban   5 mg Oral BID   Chlorhexidine  Gluconate Cloth  6 each Topical QHS   digoxin   0.0625 mg Oral Daily   divalproex   125 mg Oral BID   feeding supplement  237 mL Oral BID BM   furosemide   20 mg Intravenous BID   latanoprost   1 drop Both Eyes QHS   methIMAzole   5 mg Oral Daily   metoprolol  succinate  75 mg Oral BID   metoprolol  tartrate  2.5 mg Intravenous Once   pantoprazole   40 mg Oral Daily   polyethylene glycol  17 g Oral Daily   QUEtiapine   12.5 mg Oral QPC supper   senna  1 tablet Oral Daily   timolol   1 drop Both Eyes Daily   venlafaxine   75 mg Oral BID   Continuous Infusions:  PRN Meds: acetaminophen  **OR** acetaminophen , clorazepate , ipratropium-albuterol , nystatin , ondansetron  **OR** ondansetron  (ZOFRAN ) IV, mouth rinse   Vital Signs  Vitals:   05/20/24 0321 05/20/24 0400 05/20/24 0500 05/20/24 0729  BP:  91/64  105/76  Pulse:    86  Resp: 19 19  16   Temp:    98.6 F (37 C)  TempSrc:      SpO2:    96%  Weight:   58.9 kg   Height:        Intake/Output Summary (Last 24 hours) at 05/20/2024 0851 Last data filed at 05/20/2024 0200 Gross per 24 hour  Intake 480 ml  Output 650 ml  Net -170 ml      05/20/2024    5:00 AM 05/19/2024    4:41 AM 05/18/2024   11:12 AM  Last 3 Weights  Weight (lbs) 129 lb 12.8 oz 132 lb 4.8 oz 130 lb 8 oz  Weight (kg) 58.877 kg 60.011 kg 59.194 kg      Telemetry Atrial fibrillation rate 80-110 bpm, short pauses < 2 seconds noted - Personally Reviewed  Physical Exam  GEN: No acute distress.   Neck: No JVD Cardiac: IRIR, no murmurs, rubs, or gallops.  Respiratory:  Diminished breath sounds bilaterally GI: Soft, nontender, non-distended  MS: trace LE edema; No deformity. Neuro:  Nonfocal  Psych: Normal affect   Labs High Sensitivity Troponin:   Recent Labs  Lab 05/10/24 1108  TROPONINIHS 16     Chemistry Recent Labs  Lab 05/18/24 0403 05/19/24 0357 05/20/24 0415  NA 138 135 138  K 3.9 4.4 3.7  CL 93* 89* 86*  CO2 36* 34* 40*  GLUCOSE 70 85 74  BUN 18 15 19   CREATININE 0.33* 0.37* 0.35*  CALCIUM  9.0 9.5 9.1  MG 2.3 2.2 2.0  PROT  --   --  5.9*  ALBUMIN  --   --  2.4*  AST  --   --  12*  ALT  --   --  11  ALKPHOS  --   --  52  BILITOT  --   --  0.8  GFRNONAA >60 >60 >60  ANIONGAP 9 12 12     Lipids No results for input(s): CHOL, TRIG, HDL, LABVLDL, LDLCALC, CHOLHDL in the last 168 hours.  Hematology Recent Labs  Lab 05/16/24 [phone] 05/17/24  [phone] 05/18/24 0403  WBC 12.3* 12.7* 11.4*  RBC 4.15 4.24 4.11  HGB 12.9 13.2 12.5  HCT 42.3 42.9 40.6  MCV 101.9* 101.2* 98.8  MCH 31.1 31.1 30.4  MCHC 30.5 30.8 30.8  RDW 14.2 14.1 13.8  PLT 261 252 239   Thyroid   Recent Labs  Lab 05/20/24 0415  FREET4 0.45*    BNP Recent Labs  Lab 05/15/24 0357 05/17/24 0628  BNP 611.0* 227.3*    DDimer  No results for input(s): DDIMER in the last 168 hours.    Radiology  No results found.  Cardiac Studies  Echo 04/2024 1. Left ventricular ejection fraction, by estimation, is 50 to 55%. The  left ventricle has low normal function. The left ventricle has no regional  wall motion abnormalities. Left ventricular diastolic parameters are  indeterminate.   2. Right ventricular systolic function is normal. The right ventricular  size is normal.   3. The mitral valve is normal in structure. Mild mitral valve  regurgitation.   4. The aortic valve is tricuspid. Aortic valve regurgitation is not  visualized.   5. The inferior vena cava is normal in size with greater than 50%  respiratory variability, suggesting right  atrial pressure of 3 mmHg.   Patient Profile   78 y.o. female with a history of coronary artery calcification noted on CT imaging, chronic sinus tachycardia, HTN, HLD, hypothyroidism, chronic venous insufficiency/lymphedema, anxiety, depression, and GERD who was admitted for sepsis secondary to UTI who is being seen for the ongoing management of newly diagnosed atrial fibrillation.   Assessment & Plan   Atrial fibrillation - Newly diagnosed this admission in the setting of sepsis secondary to UTI - Also with hyperthyroidism which is likely contributing, recently started on methimazole  - Did not tolerate IV diltiazem  due to hypotension - Remains in atrial fibrillation with rates largely controlled - She is continued on metoprolol  succinate 75 mg twice daily and digoxin  0.0625 mg daily - Rate control is limited by low BP - Continue Eliquis  5 mg twice daily in the setting of  CHA2DS2-VASc Score = 5  - Consider DCCV after 3 weeks of uninterrupted anticoagulation if she remains in afib  HFpEF Acute hypoxic respiratory failure - Echo this admission with EF low normal. Unable to assess diastolic parameters in the setting of rapid afib. - Dilated pulmonary artery noted on chest CT - Remains on 2 L supplemental O2 - BNP 813 on admission, 227 on recheck 8/23 - Volume status continues to improve - She is net -3.4 L since admission - Continue to monitor kidney function, strict I/Os, and daily weights with ongoing diuresis - Will transition to oral Lasix  40 mg daily  - No SGLT2i given admission for UTI  Sepsis secondary to UTI - Antibiotics per IM  Hyperthyroidism - Recently started on methimazole  - Ongoing management per IM  For questions or updates, please contact [facility] Please consult [email or web address] for contact info under     Signed, Lesley LITTIE Maffucci, PA-C  05/20/2024, 8:51 AM

## 2024-05-20 NOTE — TOC Progression Note (Addendum)
 Transition of Care [facility]) - Progression Note    Patient Details  Name: Alexandria Sherman MRN: [id number] Date of Birth: September 20, 1946  [facility] CM/SW Contact  Lauraine JAYSON Carpen, LCSW Phone Number: 05/20/2024, 2:48 PM  Clinical Narrative:   Patient qualifies for home oxygen. CSW asked MD to place DME order. CSW ordered through [employer].  4:12 pm: Per MD, family requested that DME be delivered tomorrow morning.  Expected Discharge Plan:  (TBD) Barriers to Discharge: Continued Medical Work up               Expected Discharge Plan and Services     Post Acute Care Choice:  (TBD) Living arrangements for the past 2 months: Single Family Home                                       Social Drivers of Health (SDOH) Interventions SDOH Screenings   Food Insecurity: Food Insecurity Present (05/10/2024)  Housing: Low Risk  (05/10/2024)  Transportation Needs: No Transportation Needs (05/10/2024)  Utilities: Not At Risk (05/10/2024)  Alcohol Screen: Low Risk  (03/31/2024)  Depression (PHQ2-9): Medium Risk (05/02/2024)  Financial Resource Strain: Low Risk  (03/31/2024)  Physical Activity: Inactive (03/31/2024)  Social Connections: Socially Isolated (05/10/2024)  Stress: Stress Concern Present (03/31/2024)  Tobacco Use: Low Risk  (05/10/2024)  Health Literacy: Adequate Health Literacy (03/31/2024)    Readmission Risk Interventions     No data to display

## 2024-05-21 DIAGNOSIS — R0602 Shortness of breath: Secondary | ICD-10-CM | POA: Diagnosis not present

## 2024-05-21 DIAGNOSIS — I480 Paroxysmal atrial fibrillation: Secondary | ICD-10-CM | POA: Diagnosis not present

## 2024-05-21 DIAGNOSIS — E039 Hypothyroidism, unspecified: Secondary | ICD-10-CM | POA: Diagnosis not present

## 2024-05-21 DIAGNOSIS — I4891 Unspecified atrial fibrillation: Secondary | ICD-10-CM | POA: Diagnosis not present

## 2024-05-21 DIAGNOSIS — A419 Sepsis, unspecified organism: Secondary | ICD-10-CM | POA: Diagnosis not present

## 2024-05-21 DIAGNOSIS — R410 Disorientation, unspecified: Secondary | ICD-10-CM

## 2024-05-21 LAB — BASIC METABOLIC PANEL WITH GFR
Anion gap: 13 (ref 5–15)
BUN: 17 mg/dL (ref 8–23)
CO2: 39 mmol/L — ABNORMAL HIGH (ref 22–32)
Calcium: 8.9 mg/dL (ref 8.9–10.3)
Chloride: 86 mmol/L — ABNORMAL LOW (ref 98–111)
Creatinine, Ser: 0.35 mg/dL — ABNORMAL LOW (ref 0.44–1.00)
GFR, Estimated: >60 mL/min (ref >60)
Glucose, Bld: 79 mg/dL (ref 70–99)
Potassium: 4.3 mmol/L (ref 3.5–5.1)
Sodium: 138 mmol/L (ref 135–145)

## 2024-05-21 LAB — MAGNESIUM: Magnesium: 2.1 mg/dL (ref 1.7–2.4)

## 2024-05-21 LAB — DIGOXIN LEVEL: Digoxin Level: 0.9 ng/mL (ref 0.8–2.0)

## 2024-05-21 LAB — ANTINUCLEAR ANTIBODIES, IFA: ANA Ab, IFA: NEGATIVE

## 2024-05-21 MED ORDER — FUROSEMIDE 40 MG PO TABS: 40.0000 mg | ORAL_TABLET | Freq: Every day | ORAL | 0 refills | Status: DC

## 2024-05-21 MED ORDER — DIGOXIN 62.5 MCG PO TABS: 0.0625 mg | ORAL_TABLET | Freq: Every day | ORAL | 0 refills | Status: DC

## 2024-05-21 MED ORDER — METHIMAZOLE 5 MG PO TABS: 2.5000 mg | ORAL_TABLET | Freq: Every day | ORAL | 0 refills | Status: DC

## 2024-05-21 MED ORDER — QUETIAPINE FUMARATE 25 MG PO TABS: 12.5000 mg | ORAL_TABLET | Freq: Every day | ORAL | 0 refills | Status: DC

## 2024-05-21 MED ORDER — METOPROLOL SUCCINATE ER 25 MG PO TB24: 75.0000 mg | ORAL_TABLET | Freq: Two times a day (BID) | ORAL | 0 refills | Status: DC

## 2024-05-21 MED ORDER — APIXABAN 5 MG PO TABS: 5.0000 mg | ORAL_TABLET | Freq: Two times a day (BID) | ORAL | 3 refills | Status: DC

## 2024-05-21 NOTE — TOC Progression Note (Addendum)
 Transition of Care [facility]) - Progression Note    Patient Details  Name: Alexandria Sherman MRN: [id number] Date of Birth: 12/04/1945  Transition of Care [facility]) CM/SW Contact  Lauraine JAYSON Carpen, LCSW Phone Number: 05/21/2024, 12:15 PM  Clinical Narrative:   Hospital bed, hoyer lift, and 3-in-1 have been delivered to the home. Oxygen was assigned to a different driver. Asked MD to order an outpatient POC evaluation.  Expected Discharge Plan:  (TBD) Barriers to Discharge: Continued Medical Work up               Expected Discharge Plan and Services     Post Acute Care Choice:  (TBD) Living arrangements for the past 2 months: Single Family Home                                       Social Drivers of Health (SDOH) Interventions SDOH Screenings   Food Insecurity: Food Insecurity Present (05/10/2024)  Housing: Low Risk  (05/10/2024)  Transportation Needs: No Transportation Needs (05/10/2024)  Utilities: Not At Risk (05/10/2024)  Alcohol Screen: Low Risk  (03/31/2024)  Depression (PHQ2-9): Medium Risk (05/02/2024)  Financial Resource Strain: Low Risk  (03/31/2024)  Physical Activity: Inactive (03/31/2024)  Social Connections: Socially Isolated (05/10/2024)  Stress: Stress Concern Present (03/31/2024)  Tobacco Use: Low Risk  (05/10/2024)  Health Literacy: Adequate Health Literacy (03/31/2024)    Readmission Risk Interventions     No data to display

## 2024-05-21 NOTE — Plan of Care (Signed)
  Problem: Nutrition: Goal: Adequate nutrition will be maintained Outcome: Progressing   Problem: Elimination: Goal: Will not experience complications related to urinary retention Outcome: Progressing   Problem: Pain Managment: Goal: General experience of comfort will improve and/or be controlled Outcome: Progressing   Problem: Safety: Goal: Ability to remain free from injury will improve Outcome: Progressing   Problem: Skin Integrity: Goal: Risk for impaired skin integrity will decrease Outcome: Progressing

## 2024-05-21 NOTE — Telephone Encounter (Signed)
 LMTCB with pt's sister. Need to clarify if they are wanting pt in a skilled nursing facility or needing nursing at the home.

## 2024-05-21 NOTE — TOC Transition Note (Signed)
 Transition of Care [facility]) - Discharge Note   Patient Details  Name: Alexandria Sherman MRN: [id number] Date of Birth: [birth year]  Transition of Care [facility]) CM/SW Contact:  Lauraine JAYSON Carpen, LCSW Phone Number: 05/21/2024, 4:39 PM   Clinical Narrative: Patient has orders to discharge home today. [facility] liaison is aware. All DME has been delivered. [facility] has been arranged and they have already arrived to pick her up. Sister is aware. Caregiver is at the home waiting for her. No further concerns. CSW signing off.   Final next level of care: Home w Home Health Services Barriers to Discharge: Barriers Resolved   Patient Goals and CMS Choice     Choice offered to / list presented to : Sibling      Discharge Placement                Patient to be transferred to facility by: [facility] Name of family member notified: Arlean Clarity Patient and family notified of of transfer: 05/21/24  Discharge Plan and Services Additional resources added to the After Visit Summary for       Post Acute Care Choice:  (TBD)          DME Arranged: 3-N-1, Hospital bed, Oxygen, Other see comment [facility]ajean lift) DME Agency: [employer] Date DME Agency Contacted: 05/21/24   Representative spoke with at DME Agency: [facility] Arranged: RN, PT, OT [facility] Agency: [facility] Date [facility] Agency Contacted: 05/21/24   Representative spoke with at [facility] Agency: [facility]raine  Social Drivers of Health (SDOH) Interventions SDOH Screenings   Food Insecurity: Food Insecurity Present (05/10/2024)  Housing: Low Risk  (05/10/2024)  Transportation Needs: No Transportation Needs (05/10/2024)  Utilities: Not At Risk (05/10/2024)  Alcohol Screen: Low Risk  (03/31/2024)  Depression (PHQ2-9): Medium Risk (05/02/2024)  Financial Resource Strain: Low Risk  (03/31/2024)  Physical Activity: Inactive (03/31/2024)  Social Connections: Socially Isolated (05/10/2024)  Stress: Stress Concern Present  (03/31/2024)  Tobacco Use: Low Risk  (05/10/2024)  Health Literacy: Adequate Health Literacy (03/31/2024)     Readmission Risk Interventions     No data to display

## 2024-05-21 NOTE — Telephone Encounter (Unsigned)
 Copied from CRM [id number]. Topic: Clinical - Request for Lab/Test Order >> May 21, 2024  8:45 AM Mia F wrote: Reason for CRM: Being discharged after a 10 day stay at the hospital. Pt was diagnosed with sepsis and a uti. Sister says she think she need skilled nursing but she was told that the pcp need to fill out a form and submit it [employer]. Sister says that the insurance company says that the benefit is called private duty Engineer, civil (consulting).  [phone]

## 2024-05-21 NOTE — Progress Notes (Signed)
 Rounding Note   Patient Name: Alexandria Sherman Date of Encounter: 05/21/2024  [facility] Cardiologist: Deatrice Cage, MD  Subjective Resting comfortably, overall no complaints - Confused, wonders why her room was changed, questions on where she is, how she got there Wonders if family and friends know where she is Denies significant chest pain or shortness of breath  Scheduled Meds:  apixaban   5 mg Oral BID   Chlorhexidine  Gluconate Cloth  6 each Topical QHS   digoxin   0.0625 mg Oral Daily   divalproex   125 mg Oral BID   feeding supplement  237 mL Oral BID BM   furosemide   40 mg Oral Daily   latanoprost   1 drop Both Eyes QHS   methIMAzole   2.5 mg Oral Daily   metoprolol  succinate  75 mg Oral BID   metoprolol  tartrate  2.5 mg Intravenous Once   pantoprazole   40 mg Oral Daily   polyethylene glycol  17 g Oral Daily   QUEtiapine   12.5 mg Oral QPC supper   senna  1 tablet Oral Daily   timolol   1 drop Both Eyes Daily   venlafaxine   75 mg Oral BID   Continuous Infusions:  PRN Meds: acetaminophen  **OR** acetaminophen , clorazepate , ipratropium-albuterol , nystatin , ondansetron  **OR** ondansetron  (ZOFRAN ) IV, mouth rinse   Vital Signs  Vitals:   05/21/24 0355 05/21/24 0400 05/21/24 0500 05/21/24 0727  BP: 94/77 109/77  106/63  Pulse: 80   87  Resp: 20 20  16   Temp:    97.8 F (36.6 C)  TempSrc:      SpO2: 97%   98%  Weight:   58.3 kg   Height:        Intake/Output Summary (Last 24 hours) at 05/21/2024 0804 Last data filed at 05/21/2024 0600 Gross per 24 hour  Intake 0 ml  Output 1250 ml  Net -1250 ml      05/21/2024    5:00 AM 05/20/2024    5:00 AM 05/19/2024    4:41 AM  Last 3 Weights  Weight (lbs) 128 lb 8.5 oz 129 lb 12.8 oz 132 lb 4.8 oz  Weight (kg) 58.3 kg 58.877 kg 60.011 kg      Telemetry Atrial fibrillation rate 70-90- Personally Reviewed  Physical Exam  GEN: No acute distress.   Neck: No JVD Cardiac: Irregularly irregular, no murmurs,  rubs, or gallops.  Respiratory: Diminished breath sounds bilaterally GI: Soft, nontender, non-distended  MS: trace LE edema; No deformity. Neuro:  Nonfocal, grossly nonfocal Psych: Normal affect, confusion  Labs High Sensitivity Troponin:   Recent Labs  Lab 05/10/24 1108  TROPONINIHS 16     Chemistry Recent Labs  Lab 05/19/24 0357 05/20/24 0415 05/21/24 0452  NA 135 138 138  K 4.4 3.7 4.3  CL 89* 86* 86*  CO2 34* 40* 39*  GLUCOSE 85 74 79  BUN 15 19 17   CREATININE 0.37* 0.35* 0.35*  CALCIUM  9.5 9.1 8.9  MG 2.2 2.0 2.1  PROT  --  5.9*  --   ALBUMIN  --  2.4*  --   AST  --  12*  --   ALT  --  11  --   ALKPHOS  --  52  --   BILITOT  --  0.8  --   GFRNONAA >60 >60 >60  ANIONGAP 12 12 13     Lipids No results for input(s): CHOL, TRIG, HDL, LABVLDL, LDLCALC, CHOLHDL in the last 168 hours.  Hematology Recent Labs  Lab 05/16/24  9547 05/17/24 0628 05/18/24 0403  WBC 12.3* 12.7* 11.4*  RBC 4.15 4.24 4.11  HGB 12.9 13.2 12.5  HCT 42.3 42.9 40.6  MCV 101.9* 101.2* 98.8  MCH 31.1 31.1 30.4  MCHC 30.5 30.8 30.8  RDW 14.2 14.1 13.8  PLT 261 252 239   Thyroid   Recent Labs  Lab 05/20/24 0415  FREET4 0.45*    BNP Recent Labs  Lab 05/15/24 0357 05/17/24 0628  BNP 611.0* 227.3*    DDimer  No results for input(s): DDIMER in the last 168 hours.    Radiology  US  Venous Img Lower Bilateral (DVT) Result Date: 05/20/2024 IMPRESSION: 1. No evidence of deep venous thrombosis in either lower extremity. 2. Inability to adequately assess the right calf veins on the current study. 3. Small complex Baker's cyst of the left popliteal fossa. Electronically Signed   By: Marcey Moan M.D.   On: 05/20/2024 13:50   Cardiac Studies  Echo 04/2024 1. Left ventricular ejection fraction, by estimation, is 50 to 55%. The  left ventricle has low normal function. The left ventricle has no regional  wall motion abnormalities. Left ventricular diastolic parameters are   indeterminate.   2. Right ventricular systolic function is normal. The right ventricular  size is normal.   3. The mitral valve is normal in structure. Mild mitral valve  regurgitation.   4. The aortic valve is tricuspid. Aortic valve regurgitation is not  visualized.   5. The inferior vena cava is normal in size with greater than 50%  respiratory variability, suggesting right atrial pressure of 3 mmHg.   Patient Profile   78 y.o. female with a history of coronary artery calcification noted on CT imaging, chronic sinus tachycardia, HTN, HLD, hypothyroidism, chronic venous insufficiency/lymphedema, anxiety, depression, and GERD who was admitted for sepsis secondary to UTI who is being seen for the ongoing management of newly diagnosed atrial fibrillation.   Assessment & Plan  New onset atrial fibrillation with RVR In the setting of sepsis, hyperthyroidism -Initially treated with diltiazem  infusion, held for low blood pressure -Transition to oral medications now with adequate rate control on metoprolol  succinate 75 twice daily,  digoxin  0.0625 daily,  Eliquis  5 twice daily (When she turns 78 years old she would meet criteria for reduced dosing) - Decision made to hold off on TEE cardioversion, though cardioversion could potentially be scheduled in 3 weeks time after resolution of sepsis as below   Shortness of breath/respiratory failure Exacerbated by atrial fibrillation with RVR -Treated this admission with IV Lasix  twice daily, has been transitioned to Lasix  40 daily - Stable renal function  Hypothyroidism TSH 0.027, has been running low for many years  on methimazole  -Outpatient follow-up with endocrinology   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 coli.  Continue antibiotics per  hospitalist service -Mentation slowly improving, still remains mildly confused   [facility] will sign off.   Medication Recommendations: No further medication changes Other recommendations (labs, testing, etc): No further testing at this time Follow up as an outpatient: Outpatient follow-up in cardiology clinic  For questions or updates, please contact [facility] Please consult [email or web address] for contact info under    Signed, Velinda Lunger, MD, Ph.D [facility]

## 2024-05-21 NOTE — Discharge Summary (Signed)
 Physician Discharge Summary   Patient: Alexandria Sherman MRN: [id number] DOB: 10-24-1945  Admit date:     05/10/2024  Discharge date: 05/21/24  Discharge Physician: Drue ONEIDA Potter   PCP: Marylynn Verneita CROME, MD   Recommendations at discharge:  Follow-up with cardiologist as well as endocrinologist  Discharge Diagnoses: Principal Problem:   Atrial fibrillation (HCC) Active Problems:   Hyperthyroidism   Weakness   Generalized weakness   A-fib (HCC)   Urinary tract infection, acute   Atrial fibrillation with rapid ventricular response (HCC)   Sepsis (HCC)   Hypokalemia   Shortness of breath   Acute heart failure with preserved ejection fraction (HCC)   Confusion  Resolved Problems:   * No resolved hospital problems. [facility] Course:  From HPI Alexandria Sherman is a 78 y.o. female with medical history significant of anxiety, depression, GERD, HTN, HLD, subclinical hyperthyroidism who was brought in to [facility] ED by ambulance for generalized weakness and shortness of breath that has worsened over the last week.  Patient lives alone but she does have a caregiver at home.  With EMS her heart rate was noted to have in 160s.  Patient tells me that she has a bilateral lower extremity swelling and chronic skin changes but does not carry the diagnosis of atrial fibrillation or congestive heart failure.  She also complains some dysuria but no fever no chills.  She denies any chest pain or palpitations.  She denies any diarrhea or vomiting.  Her main complaint was she was not able to walk around at her home.   In the ED, patient was found to be in atrial fibrillation with rapid ventricular response, new onset and also patient has positive urine analysis.  She also has a low TSH level which appears to be chronic in nature.  Patient was started on Cardizem  drip in the emergency room and was started on antibiotic.  Patient has a history of blisters with the use of cephalosporins in her vaginal area.    Hospitalist service was consulted for evaluation for admission for sepsis due to urinary tract infection and atrial fibrillation with rapid ventricular response.    Patient was admitted with sepsis due to urinary tract infection requiring antibiotic therapy Patient also found to have new onset of paroxysmal atrial fibrillation and was seen by cardiologist and initiated on digoxin  as well as Lopressor  Patient will be following up with cardiologist as an outpatient She was recommended for placement in a skilled nursing facility however patient opted to go home with home health.  Risks of her decision have been discussed with her and she accepts all risks to go home rather than go to a facility.    Consultants: Cardiology Procedures performed: None Disposition: Home health Diet recommendation:  Cardiac diet DISCHARGE MEDICATION: Allergies as of 05/21/2024       Reactions   Doxycycline  Other (See Comments)   Hallucinations, nocturnal:  In the night felt like she was flipping in the bed.   Augmentin [amoxicillin-pot Clavulanate] Other (See Comments)   Pt cannot remember   Cephalexin    REACTION: vaginal blisters Tolerates penicillins   Codeine Phosphate    REACTION: vomiting   Promethazine Hcl    REACTION: nerves        Medication List     TAKE these medications    apixaban  5 MG Tabs tablet Commonly known as: ELIQUIS  Take 1 tablet (5 mg total) by mouth 2 (two) times daily.   clorazepate  3.75 MG  tablet Commonly known as: TRANXENE  TAKE 1 TABLET BY MOUTH 3 TIMES DAILY AS NEEDED FOR ANXIETY.   Digoxin  62.5 MCG Tabs Take 0.0625 mg by mouth daily. Start taking on: May 22, 2024   diphenhydrAMINE  25 MG tablet Commonly known as: BENADRYL  Take 25 mg by mouth every 6 (six) hours as needed.   divalproex  125 MG DR tablet Commonly known as: DEPAKOTE  TAKE 1 TABLET BY MOUTH TWICE DAILY   furosemide  40 MG tablet Commonly known as: LASIX  Take 1 tablet (40 mg total) by mouth  daily. Start taking on: May 22, 2024   ibuprofen 200 MG tablet Commonly known as: ADVIL Take 200 mg by mouth every 6 (six) hours as needed.   latanoprost  0.005 % ophthalmic solution Commonly known as: XALATAN  Place 1 drop into both eyes at bedtime. What changed: additional instructions   meclizine  25 MG tablet Commonly known as: ANTIVERT  TAKE ONE TABLET DAILY AS NEEDED   methimazole  5 MG tablet Commonly known as: TAPAZOLE  Take 0.5 tablets (2.5 mg total) by mouth daily. Start taking on: May 22, 2024   metoprolol  succinate 25 MG 24 hr tablet Commonly known as: TOPROL -XL Take 3 tablets (75 mg total) by mouth 2 (two) times daily.   mometasone  0.1 % cream Commonly known as: ELOCON  Apply 1 Application topically 2 (two) times daily as needed (itching).   nystatin  powder Commonly known as: MYCOSTATIN /NYSTOP  Apply 1 Application topically 3 (three) times daily.   omeprazole  20 MG capsule Commonly known as: PRILOSEC  TAKE 1 CAPSULE BY MOUTH ONCE DAILY What changed: additional instructions   polyethylene glycol 17 g packet Commonly known as: MIRALAX  / GLYCOLAX  Take 17 g by mouth daily as needed for mild constipation.   QUEtiapine  25 MG tablet Commonly known as: SEROQUEL  Take 0.5 tablets (12.5 mg total) by mouth daily after supper.   timolol  0.5 % ophthalmic solution Commonly known as: TIMOPTIC  Place 1 drop into both eyes daily. What changed: additional instructions   venlafaxine  75 MG tablet Commonly known as: EFFEXOR  TAKE ONE TABLET BY MOUTH TWICE DAILY               Durable Medical Equipment  (From admission, onward)           Start     Ordered   05/21/24 1223  For home use only DME Other see comment  Once       Comments: POC eval 1-5L pulse dose. If qualifies, dispense  Question:  Length of Need  Answer:  12 Months   05/21/24 1222   05/20/24 1450  For home use only DME oxygen  Once       Question Answer Comment  Length of Need 6 Months   Mode  or (Route) Nasal cannula   Liters per Minute 2   Frequency Continuous (stationary and portable oxygen unit needed)   Oxygen conserving device Yes   Oxygen delivery system Gas      05/20/24 1449   05/19/24 1502  For home use only DME Hospital bed  Once       Question Answer Comment  Length of Need 12 Months   Patient has (list medical condition): severe arthritis   Bed type Semi-electric   Hoyer Lift Yes      05/19/24 1502   05/19/24 1501  For home use only DME 3 n 1  Once        05/19/24 1502            Follow-up Information  Gollan, Timothy J, MD. Call.   Specialty: Cardiology Contact information: [address] [place] [address] [phone]                Discharge Exam: Fredricka Weights   05/19/24 0441 05/20/24 0500 05/21/24 0500  Weight: 60 kg 58.9 kg 58.3 kg   Constitutional: In no distress.  Cardiovascular: Normal rate, regular rhythm. 1+ bilateral lower extremity edema  Pulmonary: Non labored breathing on [place], no wheezing or rales.   Abdominal: Soft. Non distended and non tender Musculoskeletal: Normal range of motion.     Neurological: Alert and oriented to person, place, and time. Non focal  Skin: Bilateral feet cool to touch purple in color.       Condition at discharge: good  The results of significant diagnostics from this hospitalization (including imaging, microbiology, ancillary and laboratory) are listed below for reference.   Imaging Studies: US  Venous Img Lower Bilateral (DVT) Result Date: 05/20/2024 CLINICAL DATA:  Lower extremity edema and reported history of prior DVT. EXAM: BILATERAL LOWER EXTREMITY VENOUS DOPPLER ULTRASOUND TECHNIQUE: Gray-scale sonography with graded compression, as well as color Doppler and duplex ultrasound were performed to evaluate the lower extremity deep venous systems from the level of the common femoral vein and including the common femoral, femoral, profunda femoral, popliteal and calf  veins including the posterior tibial, peroneal and gastrocnemius veins when visible. The superficial great saphenous vein was also interrogated. Spectral Doppler was utilized to evaluate flow at rest and with distal augmentation maneuvers in the common femoral, femoral and popliteal veins. COMPARISON:  None Available. FINDINGS: RIGHT LOWER EXTREMITY Common Femoral Vein: No evidence of thrombus. Normal compressibility, respiratory phasicity and response to augmentation. Saphenofemoral Junction: No evidence of thrombus. Normal compressibility and flow on color Doppler imaging. Profunda Femoral Vein: No evidence of thrombus. Normal compressibility and flow on color Doppler imaging. Femoral Vein: No evidence of thrombus. Normal compressibility, respiratory phasicity and response to augmentation. Popliteal Vein: No evidence of thrombus. Normal compressibility, respiratory phasicity and response to augmentation. Calf Veins: Right calf veins are very difficult to visualize and not able to be adequately assessed on the study. Superficial Great Saphenous Vein: No evidence of thrombus. Normal compressibility. Venous Reflux:  None. Other Findings: No evidence of superficial thrombophlebitis or abnormal fluid collection. LEFT LOWER EXTREMITY Common Femoral Vein: No evidence of thrombus. Normal compressibility, respiratory phasicity and response to augmentation. Saphenofemoral Junction: No evidence of thrombus. Normal compressibility and flow on color Doppler imaging. Profunda Femoral Vein: No evidence of thrombus. Normal compressibility and flow on color Doppler imaging. Femoral Vein: No evidence of thrombus. Normal compressibility, respiratory phasicity and response to augmentation. Popliteal Vein: No evidence of thrombus. Normal compressibility, respiratory phasicity and response to augmentation. Calf Veins: No evidence of thrombus. Normal compressibility and flow on color Doppler imaging. Superficial Great Saphenous Vein: No  evidence of thrombus. Normal compressibility. Venous Reflux:  None. Other Findings: No evidence of superficial thrombophlebitis. Small complex Baker's cyst of the popliteal fossa measures approximately 2.9 x 1.2 x 1.4 cm. IMPRESSION: 1. No evidence of deep venous thrombosis in either lower extremity. 2. Inability to adequately assess the right calf veins on the current study. 3. Small complex Baker's cyst of the left popliteal fossa. Electronically Signed   By: Marcey Moan M.D.   On: 05/20/2024 13:50   US  ARTERIAL LOWER EXTREMITY DUPLEX BILATERAL Result Date: 05/15/2024 CLINICAL DATA:  Hypertension, hyperlipidemia and suspected lower extremity peripheral vascular disease. EXAM: BILATERAL LOWER EXTREMITY ARTERIAL DUPLEX  SCAN TECHNIQUE: Gray-scale sonography as well as color Doppler and duplex ultrasound was performed to evaluate the arteries of both lower extremities including the common, superficial and profunda femoral arteries, popliteal artery and calf arteries. COMPARISON:  None Available. FINDINGS: Right Lower Extremity Inflow: Normal common femoral arterial waveforms and velocities. No evidence of inflow (aortoiliac) disease. Outflow: Normal profunda femoral, superficial femoral and popliteal arterial waveforms and velocities. No focal elevation of the PSV to suggest stenosis. Mild calcified plaque in the right SFA. Runoff: Triphasic posterior tibial and biphasic anterior tibial waveforms with normal velocities. Left Lower Extremity Inflow: Normal common femoral arterial waveforms and velocities. No evidence of inflow (aortoiliac) disease. Outflow: Normal profunda femoral, superficial femoral and popliteal arterial waveforms and velocities. No focal elevation of the PSV to suggest stenosis. Mild calcified plaque in the left SFA. Runoff: Biphasic posterior tibial and triphasic anterior tibial waveforms with normal velocities. IMPRESSION: No evidence of significant arterial occlusive disease in either  lower extremity. Mild calcified plaque in bilateral superficial femoral arteries. Electronically Signed   By: Marcey Moan M.D.   On: 05/15/2024 08:31   DG Chest 1 View Result Date: 05/13/2024 CLINICAL DATA:  10027.  Tachypnea, unable to lie flat. EXAM: CHEST  1 VIEW COMPARISON:  Portable chest yesterday at 10:21 a.m. FINDINGS: 12:46 a.m. Stable cardiomegaly. Normal caliber of the central vessels. Expiratory exam with opacities in the left greater than right bases consistent with atelectasis or consolidation. Bilateral perihilar linear atelectasis again is noted and appears similar. No new lung opacity. The more cephalad lungs remain clear. Mediastinum is normally outlined. Osteopenia. There are degenerative changes of the shoulders and spine. No new osseous finding. IMPRESSION: 1. Expiratory exam with left greater than right basilar atelectasis or consolidation. 2. Stable cardiomegaly. 3. No new lung opacity. Electronically Signed   By: Francis Quam M.D.   On: 05/13/2024 00:55   DG Chest Port 1 View Result Date: 05/12/2024 CLINICAL DATA:  Shortness of breath. EXAM: PORTABLE CHEST 1 VIEW COMPARISON:  Chest CT 05/10/2024 FINDINGS: Again seen elevation of right hemidiaphragm. Stable heart size and mediastinal contours. Development of small bilateral pleural effusions with bibasilar opacities. Additional areas of bandlike atelectasis in the left mid lung. No pulmonary edema. No pneumothorax. IMPRESSION: 1. Development of small bilateral pleural effusions with bibasilar opacities. 2. Additional areas of bandlike atelectasis in the left mid lung. Electronically Signed   By: Andrea Gasman M.D.   On: 05/12/2024 12:50   US  THYROID  Result Date: 05/12/2024 CLINICAL DATA:  Hyperthyroid. EXAM: THYROID  ULTRASOUND TECHNIQUE: Ultrasound examination of the thyroid  gland and adjacent soft tissues was performed. COMPARISON:  None Available. FINDINGS: Parenchymal Echotexture: Mildly heterogenous Isthmus: 0.5 cm Right  lobe: 4.9 x 2.6 x 2.2 cm Left lobe: 4.2 x 2.6 x 2.2 cm _________________________________________________________ Estimated total number of nodules >/= 1 cm: 6-10 Number of spongiform nodules >/=  2 cm not described below (TR1): 0 Number of mixed cystic and solid nodules >/= 1.5 cm not described below (TR2): 0 _________________________________________________________ Nodule # 1: Location: Right; Mid Maximum size: 1.3 cm; Other 2 dimensions: 0.7 x 0.7 cm Composition: solid/almost completely solid (2) Echogenicity: hypoechoic (2) Shape: not taller-than-wide (0) Margins: smooth (0) Echogenic foci: none (0) ACR TI-RADS total points: 4. ACR TI-RADS risk category: TR4 (4-6 points). ACR TI-RADS recommendations: *Given size (>/= 1 - 1.4 cm) and appearance, a follow-up ultrasound in 1 year should be considered based on TI-RADS criteria. _________________________________________________________ Nodule # 2: Location: Right; Inferior Maximum size: 1.2 cm; Other  2 dimensions: 1.2 x 1.0 cm Composition: solid/almost completely solid (2) Echogenicity: hypoechoic (2) Shape: not taller-than-wide (0) Margins: smooth (0) Echogenic foci: macrocalcifications (1) ACR TI-RADS total points: 5. ACR TI-RADS risk category: TR4 (4-6 points). ACR TI-RADS recommendations: *Given size (>/= 1 - 1.4 cm) and appearance, a follow-up ultrasound in 1 year should be considered based on TI-RADS criteria. _________________________________________________________ Nodule # 6: Location: Left; Inferior Maximum size: 1.4 cm; Other 2 dimensions: 1.2 x 1.1 cm Composition: solid/almost completely solid (2) Echogenicity: isoechoic (1) Shape: not taller-than-wide (0) Margins: ill-defined (0) Echogenic foci: none (0) ACR TI-RADS total points: 3. ACR TI-RADS risk category: TR3 (3 points). ACR TI-RADS recommendations: Given size (</=1.4 cm) and appearance, this nodule does NOT meet TI-RADS criteria for biopsy or dedicated follow-up.  _________________________________________________________ Other innumerable nodules scattered throughout the thyroid  gland are cystic, partially cystic or spongiform and do not meet criteria for biopsy or dedicated follow-up. No enlarged or abnormal appearing lymph nodes are identified. IMPRESSION: Multiple bilateral thyroid  nodules. Nodules 1 and 2 described above in the right lobe meet criteria for 1 year follow-up ultrasound. Other nodules do not meet criteria for biopsy or dedicated follow-up. Electronically Signed   By: Marcey Moan M.D.   On: 05/12/2024 08:20   ECHOCARDIOGRAM COMPLETE Result Date: 05/11/2024    ECHOCARDIOGRAM REPORT   Patient Name:   TAMAYA PUN Date of Exam: 05/11/2024 Medical Rec #:  [id number]       Height:       64.0 in Accession #:    [id number]      Weight:       136.5 lb Date of Birth:  12-21-1945       BSA:          1.663 m Patient Age:    78 years        BP:           96/83 mmHg Patient Gender: F               HR:           94 bpm. Exam Location:  [facility] Procedure: 2D Echo, Cardiac Doppler and Color Doppler (Both Spectral and Color            Flow Doppler were utilized during procedure). Indications:     Atrial Fibrillation I48.91  History:         Patient has prior history of Echocardiogram examinations.  Sonographer:     Bernice Rubinstein RDCS Referring Phys:  [id number] [facility] PAUDEL Diagnosing Phys: Redell Cave MD  Sonographer Comments: Image acquisition challenging due to respiratory motion. IMPRESSIONS  1. Left ventricular ejection fraction, by estimation, is 50 to 55%. The left ventricle has low normal function. The left ventricle has no regional wall motion abnormalities. Left ventricular diastolic parameters are indeterminate.  2. Right ventricular systolic function is normal. The right ventricular size is normal.  3. The mitral valve is normal in structure. Mild mitral valve regurgitation.  4. The aortic valve is tricuspid. Aortic valve regurgitation is not visualized.   5. The inferior vena cava is normal in size with greater than 50% respiratory variability, suggesting right atrial pressure of 3 mmHg. FINDINGS  Left Ventricle: Left ventricular ejection fraction, by estimation, is 50 to 55%. The left ventricle has low normal function. The left ventricle has no regional wall motion abnormalities. The left ventricular internal cavity size was normal in size. There is no left ventricular hypertrophy. Left ventricular diastolic parameters are indeterminate. Right Ventricle:  The right ventricular size is normal. No increase in right ventricular wall thickness. Right ventricular systolic function is normal. Left Atrium: Left atrial size was normal in size. Right Atrium: Right atrial size was normal in size. Pericardium: There is no evidence of pericardial effusion. Mitral Valve: The mitral valve is normal in structure. Mild mitral valve regurgitation. Tricuspid Valve: The tricuspid valve is normal in structure. Tricuspid valve regurgitation is mild. Aortic Valve: The aortic valve is tricuspid. Aortic valve regurgitation is not visualized. Aortic valve peak gradient measures 8.8 mmHg. Pulmonic Valve: The pulmonic valve was normal in structure. Pulmonic valve regurgitation is not visualized. Aorta: The aortic root and ascending aorta are structurally normal, with no evidence of dilitation. Venous: The inferior vena cava is normal in size with greater than 50% respiratory variability, suggesting right atrial pressure of 3 mmHg. IAS/Shunts: No atrial level shunt detected by color flow Doppler.  LEFT VENTRICLE PLAX 2D LVIDd:         4.00 cm   Diastology LVIDs:         3.10 cm   LV e' medial:  5.11 cm/s LV PW:         1.10 cm   LV e' lateral: 15.20 cm/s LV IVS:        1.00 cm LVOT diam:     2.00 cm LV SV:         23 LV SV Index:   14 LVOT Area:     3.14 cm  RIGHT VENTRICLE RV Basal diam:  3.00 cm TAPSE (M-mode): 1.8 cm LEFT ATRIUM             Index        RIGHT ATRIUM           Index LA diam:         3.90 cm 2.35 cm/m   RA Area:     14.00 cm LA Vol (A2C):   61.2 ml 36.80 ml/m  RA Volume:   33.90 ml  20.38 ml/m LA Vol (A4C):   52.4 ml 31.51 ml/m LA Biplane Vol: 58.7 ml 35.30 ml/m  AORTIC VALVE                 PULMONIC VALVE AV Area (Vmax): 1.51 cm     PV Vmax:        0.79 m/s AV Vmax:        148.00 cm/s  PV Peak grad:   2.5 mmHg AV Peak Grad:   8.8 mmHg     RVOT Peak grad: 1 mmHg LVOT Vmax:      71.00 cm/s LVOT Vmean:     44.900 cm/s LVOT VTI:       0.072 m  AORTA Ao Root diam: 3.30 cm Ao Asc diam:  3.00 cm TRICUSPID VALVE TR Peak grad:   33.2 mmHg TR Vmax:        288.00 cm/s  SHUNTS Systemic VTI:  0.07 m Systemic Diam: 2.00 cm Redell Cave MD Electronically signed by Redell Cave MD Signature Date/Time: 05/11/2024/10:51:51 AM    Final    CT Angio Chest PE W and/or Wo Contrast Result Date: 05/10/2024 CLINICAL DATA:  Worsening shortness of breath. EXAM: CT ANGIOGRAPHY CHEST WITH CONTRAST TECHNIQUE: Multidetector CT imaging of the chest was performed using the standard protocol during bolus administration of intravenous contrast. Multiplanar CT image reconstructions and MIPs were obtained to evaluate the vascular anatomy. RADIATION DOSE REDUCTION: This exam was performed according to the departmental dose-optimization program which includes  automated exposure control, adjustment of the mA and/or kV according to patient size and/or use of iterative reconstruction technique. CONTRAST:  80mL OMNIPAQUE  IOHEXOL  350 MG/ML SOLN COMPARISON:  None Available. FINDINGS: Cardiovascular: The heart is enlarged. Coronary artery calcification is evident. Mild atherosclerotic calcification is noted in the wall of the thoracic aorta. Enlargement of the pulmonary outflow tract/main pulmonary arteries suggests pulmonary arterial hypertension. Mediastinum/Nodes: No mediastinal lymphadenopathy. Multinodular thyroid  gland including 1.5 cm left thyroid  nodule on image 12/5. There is no hilar lymphadenopathy.  Small hiatal hernia. The esophagus has normal imaging features. There is no axillary lymphadenopathy. Lungs/Pleura: Centrilobular emphysema. 12 x 8 mm nodule posterior left upper lobe is probably atelectatic. Streaky areas of atelectasis or scarring are noted in both lung bases. No dense consolidative airspace disease. Trace right-sided pleural effusion. Upper Abdomen: 14 mm left adrenal nodule has attenuation too high to allow characterization as an adenoma. Musculoskeletal: No worrisome lytic or sclerotic osseous abnormality. Review of the MIP images confirms the above findings. IMPRESSION: 1. No CT evidence for acute pulmonary embolus. 2. Enlargement of the pulmonary outflow tract/main pulmonary arteries suggests pulmonary arterial hypertension. 3. 12 x 8 mm nodule posterior left upper lobe is probably atelectatic. Follow-up CT chest in 3 months recommended to ensure resolution. 4. Trace right-sided pleural effusion. 5. 14 mm left adrenal nodule has attenuation too high to allow characterization as an adenoma. While likely a lipid poor adenoma or other benign finding, follow-up adrenal protocol CT abdomen without and with contrast or abdominal MRI recommended to further evaluate. 6. Multinodular thyroid  gland including 1.5 cm left thyroid  nodule. Recommend thyroid  US  (ref: J Am Coll Radiol. 2015 Feb;12(2): 143-50). 7. Aortic Atherosclerosis (ICD10-I70.0) and Emphysema (ICD10-J43.9). Electronically Signed   By: Camellia Candle M.D.   On: 05/10/2024 12:42    Microbiology: Results for orders placed or performed during the hospital encounter of 05/10/24  Blood Culture (routine x 2)     Status: None   Collection Time: 05/10/24 10:03 AM   Specimen: BLOOD  Result Value Ref Range Status   Specimen Description BLOOD LEFT ANTECUBITAL  Final   Special Requests   Final    BOTTLES DRAWN AEROBIC AND ANAEROBIC Blood Culture results may not be optimal due to an inadequate volume of blood received in culture bottles    Culture   Final    NO GROWTH 5 DAYS Performed at [facility], [address] [place], [place] [address]    Report Status 05/15/2024 FINAL  Final  Resp panel by RT-PCR (RSV, Flu A&B, Covid) Anterior Nasal Swab     Status: None   Collection Time: 05/10/24 11:09 AM   Specimen: Anterior Nasal Swab  Result Value Ref Range Status   SARS Coronavirus 2 by RT PCR NEGATIVE NEGATIVE Final    Comment: (NOTE) SARS-CoV-2 target nucleic acids are NOT DETECTED.  The SARS-CoV-2 RNA is generally detectable in upper respiratory specimens during the acute phase of infection. The lowest concentration of SARS-CoV-2 viral copies this assay can detect is 138 copies/mL. A negative result does not preclude SARS-Cov-2 infection and should not be used as the sole basis for treatment or other patient management decisions. A negative result may occur with  improper specimen collection/handling, submission of specimen other than nasopharyngeal swab, presence of viral mutation(s) within the areas targeted by this assay, and inadequate number of viral copies(<138 copies/mL). A negative result must be combined with clinical observations, patient history, and epidemiological information. The expected result is Negative.  Fact Sheet  for Patients:  BloggerCourse.com  Fact Sheet for Healthcare Providers:  SeriousBroker.it  This test is no t yet approved or cleared by the [place]  FDA and  has been authorized for detection and/or diagnosis of SARS-CoV-2 by FDA under an Emergency Use Authorization (EUA). This EUA will remain  in effect (meaning this test can be used) for the duration of the COVID-19 declaration under Section 564(b)(1) of the Act, 21 U.S.C.section 360bbb-3(b)(1), unless the authorization is terminated  or revoked sooner.       Influenza A by PCR NEGATIVE NEGATIVE Final   Influenza B by PCR NEGATIVE NEGATIVE Final    Comment:  (NOTE) The Xpert Xpress SARS-CoV-2/FLU/RSV plus assay is intended as an aid in the diagnosis of influenza from Nasopharyngeal swab specimens and should not be used as a sole basis for treatment. Nasal washings and aspirates are unacceptable for Xpert Xpress SARS-CoV-2/FLU/RSV testing.  Fact Sheet for Patients: BloggerCourse.com  Fact Sheet for Healthcare Providers: SeriousBroker.it  This test is not yet approved or cleared by the [place]  FDA and has been authorized for detection and/or diagnosis of SARS-CoV-2 by FDA under an Emergency Use Authorization (EUA). This EUA will remain in effect (meaning this test can be used) for the duration of the COVID-19 declaration under Section 564(b)(1) of the Act, 21 U.S.C. section 360bbb-3(b)(1), unless the authorization is terminated or revoked.     Resp Syncytial Virus by PCR NEGATIVE NEGATIVE Final    Comment: (NOTE) Fact Sheet for Patients: BloggerCourse.com  Fact Sheet for Healthcare Providers: SeriousBroker.it  This test is not yet approved or cleared by the [place]  FDA and has been authorized for detection and/or diagnosis of SARS-CoV-2 by FDA under an Emergency Use Authorization (EUA). This EUA will remain in effect (meaning this test can be used) for the duration of the COVID-19 declaration under Section 564(b)(1) of the Act, 21 U.S.C. section 360bbb-3(b)(1), unless the authorization is terminated or revoked.  Performed at [facility], [address], [place], [place] [address]   Urine culture     Status: Abnormal   Collection Time: 05/10/24 12:47 PM   Specimen: Urine, Clean Catch  Result Value Ref Range Status   Specimen Description   Final    URINE, CLEAN CATCH Performed at [facility], [address] [place], [place] [address]    Special Requests   Final    NONE Performed at [facility], [address] [place], [place] [address]    Culture >=100,000 COLONIES/mL ESCHERICHIA COLI (A)  Final   Report Status 05/12/2024 FINAL  Final   Organism ID, Bacteria ESCHERICHIA COLI (A)  Final      Susceptibility   Escherichia coli - MIC*    AMPICILLIN >=32 RESISTANT Resistant     CEFAZOLIN (URINE) Value in next row Sensitive      4 SENSITIVEThis is a modified FDA-approved test that has been validated and its performance characteristics determined by the reporting laboratory.  This laboratory is certified under the Clinical Laboratory Improvement Amendments CLIA as qualified to perform high complexity clinical laboratory testing.    CEFEPIME Value in next row Sensitive      4 SENSITIVEThis is a modified FDA-approved test that has been validated and its performance characteristics determined by the reporting laboratory.  This laboratory is certified under the Clinical Laboratory Improvement Amendments CLIA as qualified to perform high complexity clinical laboratory testing.    ERTAPENEM Value in next row Sensitive      4 SENSITIVEThis  is a modified FDA-approved test that has been validated and its performance characteristics determined by the reporting laboratory.  This laboratory is certified under the Clinical Laboratory Improvement Amendments CLIA as qualified to perform high complexity clinical laboratory testing.    CEFTRIAXONE Value in next row Sensitive      4 SENSITIVEThis is a modified FDA-approved test that has been validated and its performance characteristics determined by the reporting laboratory.  This laboratory is certified under the Clinical Laboratory Improvement Amendments CLIA as qualified to perform high complexity clinical laboratory testing.    CIPROFLOXACIN Value in next row Sensitive      4 SENSITIVEThis is a modified FDA-approved test that has been validated and its performance characteristics determined by the reporting laboratory.  This laboratory is  certified under the Clinical Laboratory Improvement Amendments CLIA as qualified to perform high complexity clinical laboratory testing.    GENTAMICIN Value in next row Sensitive      4 SENSITIVEThis is a modified FDA-approved test that has been validated and its performance characteristics determined by the reporting laboratory.  This laboratory is certified under the Clinical Laboratory Improvement Amendments CLIA as qualified to perform high complexity clinical laboratory testing.    NITROFURANTOIN Value in next row Sensitive      4 SENSITIVEThis is a modified FDA-approved test that has been validated and its performance characteristics determined by the reporting laboratory.  This laboratory is certified under the Clinical Laboratory Improvement Amendments CLIA as qualified to perform high complexity clinical laboratory testing.    TRIMETH /SULFA  Value in next row Sensitive      4 SENSITIVEThis is a modified FDA-approved test that has been validated and its performance characteristics determined by the reporting laboratory.  This laboratory is certified under the Clinical Laboratory Improvement Amendments CLIA as qualified to perform high complexity clinical laboratory testing.    AMPICILLIN/SULBACTAM Value in next row Intermediate      4 SENSITIVEThis is a modified FDA-approved test that has been validated and its performance characteristics determined by the reporting laboratory.  This laboratory is certified under the Clinical Laboratory Improvement Amendments CLIA as qualified to perform high complexity clinical laboratory testing.    PIP/TAZO Value in next row Sensitive ug/mL     <=4 SENSITIVEThis is a modified FDA-approved test that has been validated and its performance characteristics determined by the reporting laboratory.  This laboratory is certified under the Clinical Laboratory Improvement Amendments CLIA as qualified to perform high complexity clinical laboratory testing.    MEROPENEM  Value in next row Sensitive      <=4 SENSITIVEThis is a modified FDA-approved test that has been validated and its performance characteristics determined by the reporting laboratory.  This laboratory is certified under the Clinical Laboratory Improvement Amendments CLIA as qualified to perform high complexity clinical laboratory testing.    * >=100,000 COLONIES/mL ESCHERICHIA COLI  Blood Culture (routine x 2)     Status: None   Collection Time: 05/10/24  3:24 PM   Specimen: BLOOD RIGHT HAND  Result Value Ref Range Status   Specimen Description BLOOD RIGHT HAND  Final   Special Requests   Final    BOTTLES DRAWN AEROBIC ONLY Blood Culture results may not be optimal due to an inadequate volume of blood received in culture bottles   Culture   Final    NO GROWTH 5 DAYS Performed at [facility], [address] [place], [place] [address]    Report Status 05/15/2024 FINAL  Final  MRSA Next Gen  by PCR, Nasal     Status: None   Collection Time: 05/10/24  3:24 PM   Specimen: Nasal Mucosa; Nasal Swab  Result Value Ref Range Status   MRSA by PCR Next Gen NOT DETECTED NOT DETECTED Final    Comment: (NOTE) The GeneXpert MRSA Assay (FDA approved for NASAL specimens only), is one component of a comprehensive MRSA colonization surveillance program. It is not intended to diagnose MRSA infection nor to guide or monitor treatment for MRSA infections. Test performance is not FDA approved in patients less than 4 years old. Performed at [facility], [address] [place], [place] [address]     Labs: CBC: Recent Labs  Lab 05/15/24 [phone] 05/16/24 0452 05/17/24 0628 05/18/24 0403  WBC 12.4*  12.4* 12.3* 12.7* 11.4*  NEUTROABS 10.2*  --  10.6* 9.1*  HGB 13.3  13.3 12.9 13.2 12.5  HCT 44.3  44.1 42.3 42.9 40.6  MCV 100.7*  100.9* 101.9* 101.2* 98.8  PLT 273  267 261 252 239   Basic Metabolic Panel: Recent Labs  Lab 05/17/24 0628 05/18/24 0403 05/19/24 0357  05/20/24 0415 05/21/24 0452  NA 138 138 135 138 138  K 4.3 3.9 4.4 3.7 4.3  CL 93* 93* 89* 86* 86*  CO2 35* 36* 34* 40* 39*  GLUCOSE 78 70 85 74 79  BUN 18 18 15 19 17   CREATININE 0.43* 0.33* 0.37* 0.35* 0.35*  CALCIUM  9.4 9.0 9.5 9.1 8.9  MG 1.9 2.3 2.2 2.0 2.1   Liver Function Tests: Recent Labs  Lab 05/20/24 0415  AST 12*  ALT 11  ALKPHOS 52  BILITOT 0.8  PROT 5.9*  ALBUMIN 2.4*   CBG: Recent Labs  Lab 05/16/24 2131 05/16/24 2151  GLUCAP 258* 234*    Discharge time spent:  37 minutes.  Signed: Drue ONEIDA Potter, MD [facility] 05/21/2024

## 2024-05-22 ENCOUNTER — Telehealth: Payer: Self-pay

## 2024-05-22 LAB — PROTEIN ELECTROPHORESIS, SERUM
A/G Ratio: 0.7 (ref 0.7–1.7)
Albumin ELP: 2.3 g/dL — ABNORMAL LOW (ref 2.9–4.4)
Alpha-1-Globulin: 0.3 g/dL (ref 0.0–0.4)
Alpha-2-Globulin: 0.8 g/dL (ref 0.4–1.0)
Beta Globulin: 1 g/dL (ref 0.7–1.3)
Gamma Globulin: 1.2 g/dL (ref 0.4–1.8)
Globulin, Total: 3.3 g/dL (ref 2.2–3.9)
Total Protein ELP: 5.6 g/dL — ABNORMAL LOW (ref 6.0–8.5)

## 2024-05-22 LAB — C3 COMPLEMENT: C3 Complement: 147 mg/dL (ref 82–167)

## 2024-05-22 LAB — C4 COMPLEMENT: Complement C4, Body Fluid: 25 mg/dL (ref 12–38)

## 2024-05-22 NOTE — Transitions of Care (Post Inpatient/ED Visit) (Signed)
   05/22/2024  Name: Alexandria Sherman MRN: [id number] DOB: Jun 02, 1946  Today's TOC FU Call Status: Today's TOC FU Call Status:: Unsuccessful Call (1st Attempt) Unsuccessful Call (1st Attempt) Date: 05/22/24  Attempted to reach the patient regarding the most recent Inpatient/ED visit.  Follow Up Plan: Additional outreach attempts will be made to reach the patient to complete the Transitions of Care (Post Inpatient/ED visit) call.   Arvin Seip RN, BSN, CCM [employer], Population Health Case Manager Phone: [phone]

## 2024-05-23 ENCOUNTER — Telehealth: Payer: Self-pay

## 2024-05-23 ENCOUNTER — Telehealth: Payer: Self-pay | Admitting: *Deleted

## 2024-05-23 NOTE — Telephone Encounter (Signed)
 See other telephone encounter.

## 2024-05-23 NOTE — Transitions of Care (Post Inpatient/ED Visit) (Signed)
   05/23/2024  Name: Alexandria Sherman MRN: [id number] DOB: Sep 06, 1946  Today's TOC FU Call Status: Today's TOC FU Call Status:: Unsuccessful Call (2nd Attempt) Unsuccessful Call (2nd Attempt) Date: 05/23/24  Attempted to reach the patient regarding the most recent Inpatient/ED visit.  Follow Up Plan: Additional outreach attempts will be made to reach the patient to complete the Transitions of Care (Post Inpatient/ED visit) call.   Andrea Dimes RN, BSN [place]  [facility] RN Care Manager [phone]

## 2024-05-23 NOTE — Telephone Encounter (Signed)
 LMTCB with pt's sister. Need to clarify if they are wanting pt in a skilled nursing facility or needing nursing at the home.

## 2024-05-23 NOTE — Telephone Encounter (Unsigned)
 Copied from CRM [id number]. Topic: Appointments - Scheduling Inquiry for Clinic >> May 23, 2024 11:55 AM Chasity T wrote: Reason for CRM: Mliss sister of patient is calling to schedule hospital follow up for her. Was just released on 05/21/2024. Please call back at [phone]

## 2024-05-23 NOTE — Telephone Encounter (Signed)
 Noted

## 2024-05-23 NOTE — Telephone Encounter (Signed)
 When I spoke with patient's sister, Dagoberto Clarity, to schedule hospital follow-up appointment for patient, Dagoberto states they are trying to figure out the best way to transport patient to the appointment.  Dagoberto states patient is dead weight, as she is unable to really assist them with moving her.    I spoke with my supervisor, Shanda, who recommended we call Dagoberto back and let her know she may want to contact patient's insurance company to see if they would be able to help arrange transportation for patient to and from the visit.  I called Dagoberto and relayed the information to her.  Dagoberto states she will look into this.  Dagoberto states she will also contact the person who has been helping transport patient prior to patient's most recent medical event.  Dagoberto thanked us  for sharing this information.

## 2024-05-24 DIAGNOSIS — Z09 Encounter for follow-up examination after completed treatment for conditions other than malignant neoplasm: Secondary | ICD-10-CM | POA: Insufficient documentation

## 2024-05-24 NOTE — Assessment & Plan Note (Addendum)
 Treatment initiated during August 2025 hospitalization for  Free T4 of 1.33,  TSH0.027 in the setting of new onset afib with RVR . 2.5 mg methimazole  tid.  Endocrinology referral was placed.  Repeat thyroid  panel in 6 weeks. Free T4 had normalized by time of discharge    Lab Results  Component Value Date   TSH 0.027 (L) 05/10/2024

## 2024-05-24 NOTE — Assessment & Plan Note (Addendum)
 Newly diagnosed during August 2025 admission for A fib with RVR.    She is taking digixoin, metoprolol  and eliquis  .. I am incrasing her dose of metoprolol  succinate to 100 mg bid based on results of XIO

## 2024-05-24 NOTE — Assessment & Plan Note (Addendum)
 Patient was admitted to [facility]  on August with sepsis due to urinary tract infection requiring antibiotic therapy.  Patient also found to have new onset of paroxysmal atrial fibrillation and was seen by cardiologist and initiated on digoxin  as well as Lopressor .  She has been referred to cardiology and endocrinology  as an outpatient.  Because of her immobility and independent living status  she advised to consider  placement in a skilled nursing facility  at discharge on August 27 , but patient opted to go home with home health despite understanding the risks of her decision .  Home health referral has been broadened to include nursing

## 2024-05-27 ENCOUNTER — Telehealth: Payer: Self-pay | Admitting: *Deleted

## 2024-05-27 NOTE — Transitions of Care (Post Inpatient/ED Visit) (Signed)
 05/27/2024  Name: Alexandria Sherman MRN: [id number] DOB: [birth year]  Today's TOC FU Call Status: Today's TOC FU Call Status:: Successful TOC FU Call Completed TOC FU Call Complete Date: 05/27/24 Patient's Name and Date of Birth confirmed.  Transition Care Management Follow-up Telephone Call Date of Discharge: 05/21/24 Discharge Facility: [facility] [facility]) Type of Discharge: Inpatient Admission Primary Inpatient Discharge Diagnosis:: Atrial fibrillation How have you been since you were released from the hospital?: Better Any questions or concerns?: Yes Patient Questions/Concerns:: DPR expressed concern regarding use of POC and HH. Patient Questions/Concerns Addressed: Other: (Provided with [employer] contact information for POC questions and Contacted [facility].)  Items Reviewed: Did you receive and understand the discharge instructions provided?: Yes Medications obtained,verified, and reconciled?: Yes (Medications Reviewed) Any new allergies since your discharge?: No Dietary orders reviewed?: Yes Type of Diet Ordered:: Heart Healthy Do you have support at home?: Yes People in Home [RPT]: alone Name of Support/Comfort Primary Source: 3 Care Takers to help in the home  Medications Reviewed Today: Educated on compliance packaging and provided information on pharmacies that provide this service. Medications Reviewed Today     Reviewed by Lucky Andrea LABOR, RN (Registered Nurse) on 05/27/24 at 1608  Med List Status: <None>   Medication Order Taking? Sig Documenting Provider Last Dose Status Informant  apixaban  (ELIQUIS ) 5 MG TABS tablet [id number] Yes Take 1 tablet (5 mg total) by mouth 2 (two) times daily. Dorinda Drue DASEN, MD  Active     Discontinued 10/20/16 1034 (Change in therapy)   clorazepate  (TRANXENE ) 3.75 MG tablet [id number] Yes TAKE 1 TABLET BY MOUTH 3 TIMES DAILY AS NEEDED FOR ANXIETY. Jimmy Charlie FERNS, MD  Active Family Member, Pharmacy Records   digoxin  62.5 MCG TABS [id number] Yes Take 0.0625 mg by mouth daily. Dorinda Drue DASEN, MD  Active   diphenhydrAMINE  (BENADRYL ) 25 MG tablet [id number] Yes Take 25 mg by mouth every 6 (six) hours as needed. [provider]  Active Family Member, Pharmacy Records  divalproex  (DEPAKOTE ) 125 MG DR tablet [id number] Yes TAKE 1 TABLET BY MOUTH TWICE DAILY Jimmy Charlie FERNS, MD  Active Family Member, Pharmacy Records  furosemide  (LASIX ) 40 MG tablet [id number] Yes Take 1 tablet (40 mg total) by mouth daily. Dorinda Drue DASEN, MD  Active   ibuprofen (ADVIL,MOTRIN) 200 MG tablet [id number] Yes Take 200 mg by mouth every 6 (six) hours as needed. [provider]  Active Family Member, Pharmacy Records  latanoprost  (XALATAN ) 0.005 % ophthalmic solution [id number] Yes Place 1 drop into both eyes at bedtime. [provider]  Active Family Member, Pharmacy Records  meclizine  (ANTIVERT ) 25 MG tablet [id number] Yes TAKE ONE TABLET DAILY AS NEEDED Jimmy Charlie FERNS, MD  Active Family Member, Pharmacy Records  methimazole  (TAPAZOLE ) 5 MG tablet [id number] Yes Take 0.5 tablets (2.5 mg total) by mouth daily. Dorinda Drue DASEN, MD  Active   metoprolol  succinate (TOPROL -XL) 25 MG 24 hr tablet [id number] Yes Take 3 tablets (75 mg total) by mouth 2 (two) times daily. Dorinda Drue DASEN, MD  Active   mometasone  (ELOCON ) 0.1 % cream [id number] Yes Apply 1 Application topically 2 (two) times daily as needed (itching). Jimmy Charlie FERNS, MD  Active Family Member, Pharmacy Records  nystatin  (MYCOSTATIN /NYSTOP ) powder [id number] Yes Apply 1 Application topically 3 (three) times daily. [provider]  Active Family Member, Pharmacy Records  omeprazole  (PRILOSEC ) 20 MG capsule [id number] Yes TAKE 1 CAPSULE BY MOUTH ONCE DAILY Jimmy Charlie FERNS, MD  Active Family Member, Pharmacy Records  polyethylene glycol (MIRALAX  / GLYCOLAX ) 17 g packet [id number] Yes Take 17 g by mouth daily as needed for mild constipation. Marsa Edelman, DO  Active Family Member, Pharmacy Records  QUEtiapine  (SEROQUEL ) 25 MG tablet [id number] Yes Take 0.5 tablets (12.5 mg total) by mouth daily after supper. Dorinda Drue DASEN, MD  Active   timolol  (TIMOPTIC ) 0.5 % ophthalmic solution [id number] Yes Place 1 drop into both eyes daily. [provider]  Active Family Member, Pharmacy Records  venlafaxine  (EFFEXOR ) 75 MG tablet [id number] Yes TAKE ONE TABLET BY MOUTH TWICE DAILY Jimmy Charlie FERNS, MD  Active Family Member, Pharmacy Records            Home Care and Equipment/Supplies: Were Home Health Services Ordered?: Yes Name of Home Health Agency:: [facility] Has Agency set up a time to come to your home?: No [name] has contacted the family, but not scheduled with family. RNCM contacted [facility], request a reverse of non admit. Explained that family was confused as to which agency was contracted for [facility] Any new equipment or medical supplies ordered?: Yes Name of Medical supply agency?: [employer] Were you able to get the equipment/medical supplies?: Yes Do you have any questions related to the use of the equipment/supplies?: No  Functional Questionnaire: Do you need assistance with bathing/showering or dressing?: Yes (Patient has a Care Taker) Do you need assistance with meal preparation?: Yes Do you need assistance with eating?: No Do you have difficulty maintaining continence: Yes Do you need assistance with getting out of bed/getting out of a chair/moving?: Yes Do you have difficulty managing or taking your medications?: Yes  Follow up appointments reviewed: PCP Follow-up appointment confirmed?: Yes Date of PCP follow-up appointment?: 05/28/24 Follow-up Provider: Dr. Marylynn Specialist [facility] Follow-up appointment confirmed?: NA Do you need transportation to your follow-up appointment?: No Do you understand care options if your condition(s) worsen?: Yes-patient verbalized understanding  SDOH Interventions Today     Flowsheet Row Most Recent Value  SDOH Interventions   Food Insecurity Interventions Intervention Not Indicated  Housing Interventions Intervention Not Indicated  Transportation Interventions Intervention Not Indicated  Utilities Interventions Intervention Not Indicated    Andrea Dimes RN, BSN [place]  [facility] RN Care Manager [phone]

## 2024-05-28 ENCOUNTER — Encounter: Payer: Self-pay | Admitting: Internal Medicine

## 2024-05-28 ENCOUNTER — Ambulatory Visit: Attending: Internal Medicine

## 2024-05-28 ENCOUNTER — Ambulatory Visit: Admitting: Internal Medicine

## 2024-05-28 ENCOUNTER — Telehealth: Payer: Self-pay

## 2024-05-28 VITALS — BP 122/60 | HR 70 | Ht 64.0 in | Wt 128.0 lb

## 2024-05-28 DIAGNOSIS — M6281 Muscle weakness (generalized): Secondary | ICD-10-CM | POA: Diagnosis not present

## 2024-05-28 DIAGNOSIS — I480 Paroxysmal atrial fibrillation: Secondary | ICD-10-CM

## 2024-05-28 DIAGNOSIS — I5031 Acute diastolic (congestive) heart failure: Secondary | ICD-10-CM

## 2024-05-28 DIAGNOSIS — L97521 Non-pressure chronic ulcer of other part of left foot limited to breakdown of skin: Secondary | ICD-10-CM

## 2024-05-28 DIAGNOSIS — R531 Weakness: Secondary | ICD-10-CM

## 2024-05-28 DIAGNOSIS — I4891 Unspecified atrial fibrillation: Secondary | ICD-10-CM

## 2024-05-28 DIAGNOSIS — Z09 Encounter for follow-up examination after completed treatment for conditions other than malignant neoplasm: Secondary | ICD-10-CM

## 2024-05-28 DIAGNOSIS — G629 Polyneuropathy, unspecified: Secondary | ICD-10-CM

## 2024-05-28 DIAGNOSIS — N3946 Mixed incontinence: Secondary | ICD-10-CM

## 2024-05-28 DIAGNOSIS — I872 Venous insufficiency (chronic) (peripheral): Secondary | ICD-10-CM

## 2024-05-28 DIAGNOSIS — I890 Lymphedema, not elsewhere classified: Secondary | ICD-10-CM

## 2024-05-28 DIAGNOSIS — E0590 Thyrotoxicosis, unspecified without thyrotoxic crisis or storm: Secondary | ICD-10-CM

## 2024-05-28 DIAGNOSIS — G3184 Mild cognitive impairment, so stated: Secondary | ICD-10-CM

## 2024-05-28 DIAGNOSIS — N390 Urinary tract infection, site not specified: Secondary | ICD-10-CM

## 2024-05-28 NOTE — Patient Instructions (Signed)
 Cardiology, endocrinology and home health referrals have been made

## 2024-05-28 NOTE — Telephone Encounter (Signed)
 Copied from CRM #[id number]. Topic: Clinical - Home Health Verbal Orders >> May 28, 2024  2:35 PM Roselie BROCKS wrote: Caller[facility] home health  Callback Number: [phone] Service Requested: Physical Therapy Frequency: [facility] is not going with [facility] the family wants anther agency.  Any new concerns about the patient? Yes

## 2024-05-28 NOTE — Progress Notes (Signed)
 Subjective:  Patient ID: Alexandria Sherman, female    DOB: November 06, 1945  Age: 78 y.o. MRN: [id number]  CC: The primary encounter diagnosis was Hospital discharge follow-up. Diagnoses of Hyperthyroidism, Atrial fibrillation, unspecified type (HCC), Paroxysmal atrial fibrillation (HCC), Mixed stress and urge urinary incontinence, Generalized weakness, Proximal muscle weakness, MCI (mild cognitive impairment), Venous stasis ulcer of other part of left foot limited to breakdown of skin without varicose veins (HCC), Acute heart failure with preserved ejection fraction (HCC), Lymphedema, Urinary tract infection, acute, Weakness, and Neuropathy (HCC) were also pertinent to this visit.   HPI Alexandria Sherman presents for  Chief Complaint  Patient presents with   Hospitalization Follow-up   Alexandria Sherman is a 78 yr old female with  Mild cognitive impairment, chronic lower extremity lymphedema and weakness resulting in Nonambulatory state since her hip fracture years ago , subclinical hyperthyroidism , who presented to [facility] on August 16      shortness of breath , pyuria and new onset atrial fib with RVR.  She was admitted for treatment of  sepsis, atrial fib  ,  Patient was  treated for  urinary tract infection , and was seen by cardiology for  new onset of paroxysmal atrial fibrillation , which was treated in the ER with IV cardizem  drip.  After being seen by cardiologist she was treated with  digoxin  as well as Lopressor  Patient will be following up with cardiologist as an outpatient.  She was recommended for placement in a skilled nursing facility however patient opted to go home with home health on August 27 the risks of her decision were discussed with her  She is accompanied by her sister Alexandria Sherman who ls visiting from out of state,  and by Alexandria Sherman, a friend.  Apparently  [facility] has been contacted for Physicial Therapy but has not been to the home yet.  RN has not been ordered.  Patient  lives   independelntly but has 2 private pay aides , one who is present from 8 am to 11 am,  then another 6:15 pm to  9: 15 pm.  She Has refused additional help during the day     Outpatient Medications Prior to Visit  Medication Sig Dispense Refill   apixaban  (ELIQUIS ) 5 MG TABS tablet Take 1 tablet (5 mg total) by mouth 2 (two) times daily. 60 tablet 3   clorazepate  (TRANXENE ) 3.75 MG tablet TAKE 1 TABLET BY MOUTH 3 TIMES DAILY AS NEEDED FOR ANXIETY. 90 tablet 0   digoxin  62.5 MCG TABS Take 0.0625 mg by mouth daily. 30 tablet 0   diphenhydrAMINE  (BENADRYL ) 25 MG tablet Take 25 mg by mouth every 6 (six) hours as needed.     divalproex  (DEPAKOTE ) 125 MG DR tablet TAKE 1 TABLET BY MOUTH TWICE DAILY 180 tablet 3   furosemide  (LASIX ) 40 MG tablet Take 1 tablet (40 mg total) by mouth daily. 30 tablet 0   ibuprofen (ADVIL,MOTRIN) 200 MG tablet Take 200 mg by mouth every 6 (six) hours as needed.     latanoprost  (XALATAN ) 0.005 % ophthalmic solution Place 1 drop into both eyes at bedtime.     meclizine  (ANTIVERT ) 25 MG tablet TAKE ONE TABLET DAILY AS NEEDED 90 tablet 1   methimazole  (TAPAZOLE ) 5 MG tablet Take 0.5 tablets (2.5 mg total) by mouth daily. 30 tablet 0   metoprolol  succinate (TOPROL -XL) 25 MG 24 hr tablet Take 3 tablets (75 mg total) by mouth 2 (two) times daily. 60 tablet 0  mometasone  (ELOCON ) 0.1 % cream Apply 1 Application topically 2 (two) times daily as needed (itching). 45 g 1   nystatin  (MYCOSTATIN /NYSTOP ) powder Apply 1 Application topically 3 (three) times daily.     omeprazole  (PRILOSEC ) 20 MG capsule TAKE 1 CAPSULE BY MOUTH ONCE DAILY 90 capsule 3   polyethylene glycol (MIRALAX  / GLYCOLAX ) 17 g packet Take 17 g by mouth daily as needed for mild constipation. 14 each 0   QUEtiapine  (SEROQUEL ) 25 MG tablet Take 0.5 tablets (12.5 mg total) by mouth daily after supper. 30 tablet 0   timolol  (TIMOPTIC ) 0.5 % ophthalmic solution Place 1 drop into both eyes daily.     venlafaxine  (EFFEXOR )  75 MG tablet TAKE ONE TABLET BY MOUTH TWICE DAILY 180 tablet 3   No facility-administered medications prior to visit.    Review of Systems;  Patient denies headache, fevers, malaise, unintentional weight loss, skin rash, eye pain, sinus congestion and sinus pain, sore throat, dysphagia,  hemoptysis , cough, dyspnea, wheezing, chest pain, palpitations, orthopnea, edema, abdominal pain, nausea, melena, diarrhea, constipation, flank pain, dysuria, hematuria, urinary  Frequency, nocturia, numbness, tingling, seizures,  Focal weakness, Loss of consciousness,  Tremor, insomnia, depression, anxiety, and suicidal ideation.      Objective:  BP 122/60   Pulse 70   Ht 5' 4 (1.626 m)   Wt 128 lb (58.1 kg)   SpO2 99%   BMI 21.97 kg/m   BP Readings from Last 3 Encounters:  05/28/24 122/60  05/21/24 112/81  04/03/24 120/72    Wt Readings from Last 3 Encounters:  05/28/24 128 lb (58.1 kg)  05/21/24 128 lb 8.5 oz (58.3 kg)  04/03/24 130 lb (59 kg)    Physical Exam Vitals reviewed.  Constitutional:      General: She is not in acute distress.    Appearance: She is normal weight. She is not ill-appearing, toxic-appearing or diaphoretic.     Comments: Chronic ill appearing.  Sitting upright in a wheelchair  HENT:     Head: Normocephalic.  Eyes:     General: No scleral icterus.       Right eye: No discharge.        Left eye: No discharge.     Conjunctiva/sclera: Conjunctivae normal.  Cardiovascular:     Rate and Rhythm: Normal rate and regular rhythm.     Heart sounds: Normal heart sounds.  Pulmonary:     Effort: Pulmonary effort is normal. No respiratory distress.     Breath sounds: Normal breath sounds.  Musculoskeletal:        General: Normal range of motion.  Skin:    General: Skin is warm and dry.  Neurological:     General: No focal deficit present.     Mental Status: She is alert and oriented to person, place, and time. Mental status is at baseline.  Psychiatric:         Mood and Affect: Mood normal.        Behavior: Behavior normal.        Thought Content: Thought content normal.        Judgment: Judgment normal.     Lab Results  Component Value Date   HGBA1C 5.7 (H) 05/12/2024   HGBA1C 5.7 11/22/2020   HGBA1C 5.9 10/22/2017    Lab Results  Component Value Date   CREATININE 0.35 (L) 05/21/2024   CREATININE 0.35 (L) 05/20/2024   CREATININE 0.37 (L) 05/19/2024    Lab Results  Component Value Date  WBC 11.4 (H) 05/18/2024   HGB 12.5 05/18/2024   HCT 40.6 05/18/2024   PLT 239 05/18/2024   GLUCOSE 79 05/21/2024   CHOL 112 05/12/2024   TRIG 78 05/12/2024   HDL 31 (L) 05/12/2024   LDLDIRECT 140.8 06/15/2009   LDLCALC 65 05/12/2024   ALT 11 05/20/2024   AST 12 (L) 05/20/2024   NA 138 05/21/2024   K 4.3 05/21/2024   CL 86 (L) 05/21/2024   CREATININE 0.35 (L) 05/21/2024   BUN 17 05/21/2024   CO2 39 (H) 05/21/2024   TSH 0.027 (L) 05/10/2024   INR 1.1 05/10/2024   HGBA1C 5.7 (H) 05/12/2024    DG Chest Port 1 View Result Date: 05/12/2024 CLINICAL DATA:  Shortness of breath. EXAM: PORTABLE CHEST 1 VIEW COMPARISON:  Chest CT 05/10/2024 FINDINGS: Again seen elevation of right hemidiaphragm. Stable heart size and mediastinal contours. Development of small bilateral pleural effusions with bibasilar opacities. Additional areas of bandlike atelectasis in the left mid lung. No pulmonary edema. No pneumothorax. IMPRESSION: 1. Development of small bilateral pleural effusions with bibasilar opacities. 2. Additional areas of bandlike atelectasis in the left mid lung. Electronically Signed   By: Andrea Gasman M.D.   On: 05/12/2024 12:50   US  THYROID  Result Date: 05/12/2024 CLINICAL DATA:  Hyperthyroid. EXAM: THYROID  ULTRASOUND TECHNIQUE: Ultrasound examination of the thyroid  gland and adjacent soft tissues was performed. COMPARISON:  None Available. FINDINGS: Parenchymal Echotexture: Mildly heterogenous Isthmus: 0.5 cm Right lobe: 4.9 x 2.6 x 2.2 cm Left  lobe: 4.2 x 2.6 x 2.2 cm _________________________________________________________ Estimated total number of nodules >/= 1 cm: 6-10 Number of spongiform nodules >/=  2 cm not described below (TR1): 0 Number of mixed cystic and solid nodules >/= 1.5 cm not described below (TR2): 0 _________________________________________________________ Nodule # 1: Location: Right; Mid Maximum size: 1.3 cm; Other 2 dimensions: 0.7 x 0.7 cm Composition: solid/almost completely solid (2) Echogenicity: hypoechoic (2) Shape: not taller-than-wide (0) Margins: smooth (0) Echogenic foci: none (0) ACR TI-RADS total points: 4. ACR TI-RADS risk category: TR4 (4-6 points). ACR TI-RADS recommendations: *Given size (>/= 1 - 1.4 cm) and appearance, a follow-up ultrasound in 1 year should be considered based on TI-RADS criteria. _________________________________________________________ Nodule # 2: Location: Right; Inferior Maximum size: 1.2 cm; Other 2 dimensions: 1.2 x 1.0 cm Composition: solid/almost completely solid (2) Echogenicity: hypoechoic (2) Shape: not taller-than-wide (0) Margins: smooth (0) Echogenic foci: macrocalcifications (1) ACR TI-RADS total points: 5. ACR TI-RADS risk category: TR4 (4-6 points). ACR TI-RADS recommendations: *Given size (>/= 1 - 1.4 cm) and appearance, a follow-up ultrasound in 1 year should be considered based on TI-RADS criteria. _________________________________________________________ Nodule # 6: Location: Left; Inferior Maximum size: 1.4 cm; Other 2 dimensions: 1.2 x 1.1 cm Composition: solid/almost completely solid (2) Echogenicity: isoechoic (1) Shape: not taller-than-wide (0) Margins: ill-defined (0) Echogenic foci: none (0) ACR TI-RADS total points: 3. ACR TI-RADS risk category: TR3 (3 points). ACR TI-RADS recommendations: Given size (</=1.4 cm) and appearance, this nodule does NOT meet TI-RADS criteria for biopsy or dedicated follow-up. _________________________________________________________ Other  innumerable nodules scattered throughout the thyroid  gland are cystic, partially cystic or spongiform and do not meet criteria for biopsy or dedicated follow-up. No enlarged or abnormal appearing lymph nodes are identified. IMPRESSION: Multiple bilateral thyroid  nodules. Nodules 1 and 2 described above in the right lobe meet criteria for 1 year follow-up ultrasound. Other nodules do not meet criteria for biopsy or dedicated follow-up. Electronically Signed   By: Marcey Luverne HERO.D.  On: 05/12/2024 08:20   ECHOCARDIOGRAM COMPLETE Result Date: 05/11/2024    ECHOCARDIOGRAM REPORT   Patient Name:   Alexandria Sherman Date of Exam: 05/11/2024 Medical Rec #:  [id number]       Height:       64.0 in Accession #:    [id number]      Weight:       136.5 lb Date of Birth:  03-24-1946       BSA:          1.663 m Patient Age:    60 years        BP:           96/83 mmHg Patient Gender: F               HR:           94 bpm. Exam Location:  [facility] Procedure: 2D Echo, Cardiac Doppler and Color Doppler (Both Spectral and Color            Flow Doppler were utilized during procedure). Indications:     Atrial Fibrillation I48.91  History:         Patient has prior history of Echocardiogram examinations.  Sonographer:     Bernice Rubinstein RDCS Referring Phys:  [id number] [facility] PAUDEL Diagnosing Phys: Redell Cave MD  Sonographer Comments: Image acquisition challenging due to respiratory motion. IMPRESSIONS  1. Left ventricular ejection fraction, by estimation, is 50 to 55%. The left ventricle has low normal function. The left ventricle has no regional wall motion abnormalities. Left ventricular diastolic parameters are indeterminate.  2. Right ventricular systolic function is normal. The right ventricular size is normal.  3. The mitral valve is normal in structure. Mild mitral valve regurgitation.  4. The aortic valve is tricuspid. Aortic valve regurgitation is not visualized.  5. The inferior vena cava is normal in size with greater than 50%  respiratory variability, suggesting right atrial pressure of 3 mmHg. FINDINGS  Left Ventricle: Left ventricular ejection fraction, by estimation, is 50 to 55%. The left ventricle has low normal function. The left ventricle has no regional wall motion abnormalities. The left ventricular internal cavity size was normal in size. There is no left ventricular hypertrophy. Left ventricular diastolic parameters are indeterminate. Right Ventricle: The right ventricular size is normal. No increase in right ventricular wall thickness. Right ventricular systolic function is normal. Left Atrium: Left atrial size was normal in size. Right Atrium: Right atrial size was normal in size. Pericardium: There is no evidence of pericardial effusion. Mitral Valve: The mitral valve is normal in structure. Mild mitral valve regurgitation. Tricuspid Valve: The tricuspid valve is normal in structure. Tricuspid valve regurgitation is mild. Aortic Valve: The aortic valve is tricuspid. Aortic valve regurgitation is not visualized. Aortic valve peak gradient measures 8.8 mmHg. Pulmonic Valve: The pulmonic valve was normal in structure. Pulmonic valve regurgitation is not visualized. Aorta: The aortic root and ascending aorta are structurally normal, with no evidence of dilitation. Venous: The inferior vena cava is normal in size with greater than 50% respiratory variability, suggesting right atrial pressure of 3 mmHg. IAS/Shunts: No atrial level shunt detected by color flow Doppler.  LEFT VENTRICLE PLAX 2D LVIDd:         4.00 cm   Diastology LVIDs:         3.10 cm   LV e' medial:  5.11 cm/s LV PW:         1.10 cm   LV e' lateral: 15.20 cm/s  LV IVS:        1.00 cm LVOT diam:     2.00 cm LV SV:         23 LV SV Index:   14 LVOT Area:     3.14 cm  RIGHT VENTRICLE RV Basal diam:  3.00 cm TAPSE (M-mode): 1.8 cm LEFT ATRIUM             Index        RIGHT ATRIUM           Index LA diam:        3.90 cm 2.35 cm/m   RA Area:     14.00 cm LA Vol (A2C):    61.2 ml 36.80 ml/m  RA Volume:   33.90 ml  20.38 ml/m LA Vol (A4C):   52.4 ml 31.51 ml/m LA Biplane Vol: 58.7 ml 35.30 ml/m  AORTIC VALVE                 PULMONIC VALVE AV Area (Vmax): 1.51 cm     PV Vmax:        0.79 m/s AV Vmax:        148.00 cm/s  PV Peak grad:   2.5 mmHg AV Peak Grad:   8.8 mmHg     RVOT Peak grad: 1 mmHg LVOT Vmax:      71.00 cm/s LVOT Vmean:     44.900 cm/s LVOT VTI:       0.072 m  AORTA Ao Root diam: 3.30 cm Ao Asc diam:  3.00 cm TRICUSPID VALVE TR Peak grad:   33.2 mmHg TR Vmax:        288.00 cm/s  SHUNTS Systemic VTI:  0.07 m Systemic Diam: 2.00 cm Redell Cave MD Electronically signed by Redell Cave MD Signature Date/Time: 05/11/2024/10:51:51 AM    Final     Assessment & Plan:  .Hospital discharge follow-up Assessment & Plan: Patient was admitted to [facility]  on August with sepsis due to urinary tract infection requiring antibiotic therapy.  Patient also found to have new onset of paroxysmal atrial fibrillation and was seen by cardiologist and initiated on digoxin  as well as Lopressor .  She has been referred to cardiology and endocrinology  as an outpatient.  Because of her immobility and independent living status  she advised to consider  placement in a skilled nursing facility  at discharge on August 27 , but patient opted to go home with home health despite understanding the risks of her decision .  Home health referral has been broadened to include nursing   Orders: -     Ambulatory referral to Home Health  Hyperthyroidism Assessment & Plan: Treatment initiated during August 2025 hospitalization for  Free T4 of 1.33,  TSH0.027 in the setting of new onset afib with RVR . 2.5 mg methimazole  tid.  Endocrinology referral was placed.  Repeat thyroid  panel in 6 weeks. Free T4 had normalized by time of discharge    Lab Results  Component Value Date   TSH 0.027 (L) 05/10/2024     Orders: -     Ambulatory referral to Endocrinology  Atrial fibrillation,  unspecified type [facility]) Assessment & Plan: Newly diagnosed during August 2025 admission for A fib with RVR.    She is taking digixoin, metoprolol  and eliquis    Orders: -     Ambulatory referral to Home Health  Paroxysmal atrial fibrillation [facility]) Assessment & Plan: Newly diagnosed during August 2025 admission for A fib with RVR.  She is taking digixoin, metoprolol  and eliquis    Orders: -     Ambulatory referral to Cardiology -     LONG TERM MONITOR (3-14 DAYS); Future  Mixed stress and urge urinary incontinence -     For home use only DME Other see comment  Generalized weakness -     Ambulatory referral to Home Health  Proximal muscle weakness -     For home use only DME Other see comment -     Ambulatory referral to Home Health  MCI (mild cognitive impairment) -     Ambulatory referral to Home Health  Venous stasis ulcer of other part of left foot limited to breakdown of skin without varicose veins (HCC) -     Ambulatory referral to Home Health  Acute heart failure with preserved ejection fraction [facility]) Assessment & Plan: Secondary to PAF, which may have been triggered by hyperthyroidism.  Continue beta blocker,  DOAC and methimazole  .  Digoxin  was added for rate control   Lymphedema Assessment & Plan: She  was encouraged to resume use of lymphedema pumps with the assistance of her morning aid.  Advised to repeat pumping in the evening with her evening aide    Urinary tract infection, acute Assessment & Plan: Secondary to E Coli resistant to ampicillin   She was treated  during hospitalization    Weakness Assessment & Plan: Workup prior to [facility] reviewed.  She was diagnosed by Dr Lane in 2015 with Parkinson Disease and prescribed Sinemet , but there was no follow up.  She has also apparently been diagnosed with neuropathy.  She has undoubtedly had significant muscle atrophy given her  nonambulatory status of 2 years .    Home PT has been ordered ,  as she declined  transfer to rehab at discharge from recent hospitalization    Neuropathy [facility]) Assessment & Plan: Etiology unclear despite neurologic evaluation.  She requires assistance with all ADLS  and cannot ambulate without a walker and hands on assistance due to bilateral leg weakness aggravated by her history of hip fracture.      Follow-up: Return in about 4 weeks (around 06/25/2024).   Verneita LITTIE Kettering, MD

## 2024-05-29 ENCOUNTER — Other Ambulatory Visit: Payer: Self-pay

## 2024-05-29 ENCOUNTER — Other Ambulatory Visit: Payer: Self-pay | Admitting: Internal Medicine

## 2024-05-29 NOTE — Telephone Encounter (Signed)
 This cream was given to pt while in the hospital for her feet. Pt's sister is requesting a refill. Is it okay to refill?

## 2024-05-29 NOTE — Telephone Encounter (Unsigned)
 Copied from CRM [id number]. Topic: Clinical - Medication Refill >> May 29, 2024  1:44 PM Suzen RAMAN wrote: Medication: Clotrimazole /Betamethasone  (Lotrisone ) 1-0.05%CREAM   Has the patient contacted their pharmacy? Yes   This is the patient's preferred pharmacy:  [facility] - [place], [place] - [address] [place] [address] Phone: [phone] Fax: [phone]  Is this the correct pharmacy for this prescription? Yes If no, delete pharmacy and type the correct one.   Has the prescription been filled recently? No  Is the patient out of the medication? Yes  Has the patient been seen for an appointment in the last year OR does the patient have an upcoming appointment? Yes  Can we respond through MyChart? Yes  Agent: Please be advised that Rx refills may take up to 3 business days. We ask that you follow-up with your pharmacy.

## 2024-05-29 NOTE — Telephone Encounter (Signed)
 Spoke with pt's sister Dagoberto and she stated that she was wrong and thought it was another company but realized that it wasn't. She called [facility] back and they stated that they would add pt back so she could start getting her PT.

## 2024-05-29 NOTE — Telephone Encounter (Signed)
 Requesting medication not on profile: Clotrimazole /Betamethasone  (Lotrisone ) 1-0.05%CREAM

## 2024-05-31 ENCOUNTER — Encounter: Payer: Self-pay | Admitting: Internal Medicine

## 2024-05-31 NOTE — Assessment & Plan Note (Addendum)
 Workup prior to [facility] reviewed.  She was diagnosed by Dr Lane in 2015 with Parkinson Disease and prescribed Sinemet , but there was no follow up.  She has also apparently been diagnosed with neuropathy.  She has undoubtedly had significant muscle atrophy given her  nonambulatory status of 2 years .    Home PT has been ordered ,  as she declined transfer to rehab at discharge from recent hospitalization

## 2024-05-31 NOTE — Assessment & Plan Note (Signed)
 Etiology unclear despite neurologic evaluation.  She requires assistance with all ADLS  and cannot ambulate without a walker and hands on assistance due to bilateral leg weakness aggravated by her history of hip fracture.

## 2024-05-31 NOTE — Assessment & Plan Note (Signed)
 She  was encouraged to resume use of lymphedema pumps with the assistance of her morning aid.  Advised to repeat pumping in the evening with her evening aide

## 2024-05-31 NOTE — Assessment & Plan Note (Signed)
 Secondary to E Coli resistant to ampicillin   She was treated  during hospitalization

## 2024-05-31 NOTE — Assessment & Plan Note (Signed)
 Secondary to PAF, which may have been triggered by hyperthyroidism.  Continue beta blocker,  DOAC and methimazole  .  Digoxin  was added for rate control

## 2024-06-02 MED ORDER — CLOTRIMAZOLE-BETAMETHASONE 1-0.05 % EX CREA: 1.0000 | TOPICAL_CREAM | Freq: Every day | CUTANEOUS | 0 refills | Status: DC

## 2024-06-02 NOTE — Telephone Encounter (Signed)
 I have pended to have refilled

## 2024-06-03 NOTE — Telephone Encounter (Unsigned)
 Copied from CRM #[id number]. Topic: General - Other >> Jun 03, 2024  9:24 AM Carlyon D wrote: Reason for CRM: [facility], Mrs Brusseau has been admitted for home health services and they will fax orders over to office. SABRA Connor # [phone].

## 2024-06-03 NOTE — Telephone Encounter (Signed)
 noted

## 2024-06-04 NOTE — Progress Notes (Signed)
 Hi Carlie,   Pt has scheduled appt with LCSW on 06/09/24. No need for reschedule closed out RS.   Thank you, Shereen Gin [facility] VBCI Assistant Direct Dial: [phone]  Fax: [phone] Website: [email or web address]

## 2024-06-06 DIAGNOSIS — N390 Urinary tract infection, site not specified: Secondary | ICD-10-CM | POA: Diagnosis not present

## 2024-06-06 DIAGNOSIS — I5032 Chronic diastolic (congestive) heart failure: Secondary | ICD-10-CM | POA: Diagnosis not present

## 2024-06-06 DIAGNOSIS — D7589 Other specified diseases of blood and blood-forming organs: Secondary | ICD-10-CM

## 2024-06-06 DIAGNOSIS — I480 Paroxysmal atrial fibrillation: Secondary | ICD-10-CM

## 2024-06-06 DIAGNOSIS — F32A Depression, unspecified: Secondary | ICD-10-CM

## 2024-06-06 DIAGNOSIS — I110 Hypertensive heart disease with heart failure: Secondary | ICD-10-CM | POA: Diagnosis not present

## 2024-06-06 DIAGNOSIS — J9601 Acute respiratory failure with hypoxia: Secondary | ICD-10-CM

## 2024-06-06 DIAGNOSIS — I872 Venous insufficiency (chronic) (peripheral): Secondary | ICD-10-CM

## 2024-06-06 DIAGNOSIS — B9620 Unspecified Escherichia coli [E. coli] as the cause of diseases classified elsewhere: Secondary | ICD-10-CM | POA: Diagnosis not present

## 2024-06-06 DIAGNOSIS — E873 Alkalosis: Secondary | ICD-10-CM

## 2024-06-06 DIAGNOSIS — I890 Lymphedema, not elsewhere classified: Secondary | ICD-10-CM

## 2024-06-06 DIAGNOSIS — F419 Anxiety disorder, unspecified: Secondary | ICD-10-CM

## 2024-06-09 ENCOUNTER — Telehealth: Payer: Self-pay

## 2024-06-09 NOTE — Patient Outreach (Signed)
 RNCM returned call to sister of patient Alexandria Sherman. Sister left message asking if appointment necessary and for call back. Spoke with Alexandria, again provided info on purpose of RNCM and referral from PCP, stated does need assist with sister as she lives in [place] , no family here to assist. Would like discussion with patient regarding need for additional assistance, possibly Palliative/Hospice care as her LTC insurance is running out which would limit ability to continue with [facility] 2x day/7 days/week. Sister stated she will remind patient of call and encourage her to participate.

## 2024-06-09 NOTE — Patient Outreach (Signed)
 RNCM called patient [phone], unable to leave message. Recalled 0900 to [facility], messages left requesting call back. Sent to CG to reschedule Initial visit.

## 2024-06-10 NOTE — Telephone Encounter (Signed)
 Error

## 2024-06-16 ENCOUNTER — Other Ambulatory Visit: Payer: Self-pay | Admitting: Internal Medicine

## 2024-06-16 NOTE — Telephone Encounter (Unsigned)
 Copied from CRM #[id number]. Topic: Clinical - Medication Question >> Jun 16, 2024  3:15 PM Martinique E wrote: Reason for CRM: Lorenza, RN with [facility], was going through patient's medication list today and was questioning if PCP wants to keep patient on metoprolol  succinate (TOPROL -XL) 25 MG 24 hr tablet, as this was prescribed in the hospital. Callback number for Lorenza is [phone].

## 2024-06-17 NOTE — Telephone Encounter (Signed)
 LMTCB with Lorenza, RN with [facility]. When she returns call we need to let her know that Dr. Marylynn does want pt to continue the metoprolol . Medication has been sent to pharmacy.

## 2024-06-18 ENCOUNTER — Ambulatory Visit: Admitting: Nurse Practitioner

## 2024-06-18 NOTE — Telephone Encounter (Unsigned)
 Copied from CRM [id number]. Topic: General - Other >> Jun 18, 2024  4:28 PM Burnard DEL wrote: Reason for CRM: Alexandria Sherman from [facility] wanted to let provider know that patient had swelling in her right forearm on Monday when she seen her. She doesn't think that she had a fall or anything.

## 2024-06-19 ENCOUNTER — Telehealth: Payer: Self-pay | Admitting: Nurse Practitioner

## 2024-06-19 ENCOUNTER — Telehealth: Payer: Self-pay

## 2024-06-19 ENCOUNTER — Other Ambulatory Visit: Payer: Self-pay

## 2024-06-19 ENCOUNTER — Telehealth: Payer: Self-pay | Admitting: Cardiovascular Disease

## 2024-06-19 DIAGNOSIS — Z7409 Other reduced mobility: Secondary | ICD-10-CM

## 2024-06-19 DIAGNOSIS — I480 Paroxysmal atrial fibrillation: Secondary | ICD-10-CM

## 2024-06-19 DIAGNOSIS — F418 Other specified anxiety disorders: Secondary | ICD-10-CM

## 2024-06-19 NOTE — Telephone Encounter (Signed)
 Caller Elta) is reporting abnormal results.

## 2024-06-19 NOTE — Patient Outreach (Signed)
 Complex Care Management   Visit Note  06/19/2024  Name:  Alexandria Sherman MRN: [id number] DOB: 25-Jun-1946  Situation: Referral received for Complex Care Management related to Lymphedema Falls Depression I obtained verbal consent from Patient.  Visit completed with Caregiver Patient Sister  on the phone  Background:   Past Medical History:  Diagnosis Date   Acute lateral meniscus tear of right knee    Anxiety disorder, unspecified    Atrial fibrillation with rapid ventricular response (HCC) 05/11/2024   Cataract [facility]   Clotting disorder blood circulation/veins in feet and lower legs   Depression 1978   Diverticulosis of colon 04/2005   GERD (gastroesophageal reflux disease)    Glaucoma [facility]   Hypertension    Hyperthyroidism    Dr Damian   Impaired fasting glucose    Low TSH level    chronic low, but euthyroid   Microscopic hematuria    RBBB    S/p left hip fracture    Sepsis (HCC) 05/14/2024   Sleep disturbance    Subclinical hyperthyroidism    unspecified   Tibia fracture 05/2010   left    Assessment: Patient Reported Symptoms:  Cognitive Cognitive Status: Struggling with memory recall, Difficulties with attention and concentration, Requires Assistance Decision Making, Alert and oriented to person, place, and time, Normal speech and language skills   Health Maintenance Behaviors: Annual physical exam, Healthy diet, Sleep adequate Healing Pattern: Average Health Facilitated by: Rest, Healthy diet  Neurological Neurological Review of Symptoms: Weakness Neurological Management Strategies: Routine screening Neurological Comment: generalized weakness, cannot stand  HEENT HEENT Symptoms Reported: No symptoms reported HEENT Management Strategies: Routine screening    Cardiovascular Cardiovascular Symptoms Reported: Fatigue Cardiovascular Management Strategies: Routine screening, Medication therapy Cardiovascular Comment: very fatigues,  immobile  Respiratory Other Respiratory Symptoms: wears O2 2L, very little activity, advised to do breathing exercises Respiratory Management Strategies: Routine screening  Endocrine Endocrine Symptoms Reported: Not assessed Is patient diabetic?: No    Gastrointestinal Gastrointestinal Symptoms Reported: No symptoms reported Additional Gastrointestinal Details: Miralax  prn      Genitourinary Genitourinary Symptoms Reported: Incontinence Genitourinary Management Strategies: Incontinence garment/pad  Integumentary Additional Integumentary Details: heels cracked, wound R great toe still open and bleeds when disturbed, advised to make appointment Wound Care, Lymphedema pumps 1x day for 60 minutes - provider advised 2x day 90 min ea, staets no swelling no open areas, discussed sx to notify provider, Skin Management Strategies: Routine screening  Musculoskeletal Musculoskelatal Symptoms Reviewed: Weakness Additional Musculoskeletal Details: no pain - just weak, unable to stand, aides lift to transfer gets PT/OT and [facility] nurse   Falls in the past year?: Yes Number of falls in past year: 2 or more Was there an injury with Fall?: Yes Fall Risk Category Calculator: 3 Patient Fall Risk Level: High Fall Risk Patient at Risk for Falls Due to: History of fall(s), Impaired mobility, Mental status change Fall risk Follow up: Falls evaluation completed, Education provided, Falls prevention discussed (Anticoagulent - aware ED for head bumps)  Psychosocial Psychosocial Symptoms Reported: Anxiety - if selected complete GAD, Depression - if selected complete PHQ 2-9, Sadness - if selected complete PHQ 2-9 Additional Psychological Details: declines LCSW - discussed Palliative Care - patient and sister in agreement with  referral, Behavioral Management Strategies: Medication therapy, Support system Major Change/Loss/Stressor/Fears (CP): Medical condition, self Techniques to Cope with Loss/Stress/Change:  Diversional activities Quality of Family Relationships: helpful, involved, supportive Do you feel physically threatened by others?: No  06/19/2024    PHQ2-9 Depression Screening   Little interest or pleasure in doing things Nearly every day  Feeling down, depressed, or hopeless Nearly every day  PHQ-2 - Total Score 6  Trouble falling or staying asleep, or sleeping too much Not at all  Feeling tired or having little energy Nearly every day  Poor appetite or overeating  Several days  Feeling bad about yourself - or that you are a failure or have let yourself or your family down Nearly every day  Trouble concentrating on things, such as reading the newspaper or watching television Nearly every day  Moving or speaking so slowly that other people could have noticed.  Or the opposite - being so fidgety or restless that you have been moving around a lot more than usual Not at all  Thoughts that you would be better off dead, or hurting yourself in some way Nearly every day  PHQ2-9 Total Score 19  If you checked off any problems, how difficult have these problems made it for you to do your work, take care of things at home, or get along with other people Extremely dIfficult (Does not see the point of being here - taken over sister's life, no plan not that brave)  Depression Interventions/Treatment      There were no vitals filed for this visit.  Medications Reviewed Today     Reviewed by Devra Lands, RN (Registered Nurse) on 06/19/24 at 0935  Med List Status: <None>   Medication Order Taking? Sig Documenting Provider Last Dose Status Informant  apixaban  (ELIQUIS ) 5 MG TABS tablet [id number] Yes Take 1 tablet (5 mg total) by mouth 2 (two) times daily. Dorinda Drue DASEN, MD  Active     Discontinued 10/20/16 1034 (Change in therapy)   clorazepate  (TRANXENE ) 3.75 MG tablet [id number] Yes TAKE 1 TABLET BY MOUTH 3 TIMES DAILY AS NEEDED FOR ANXIETY. Jimmy Charlie FERNS, MD  Active Family Member,  Pharmacy Records  clotrimazole -betamethasone  (LOTRISONE ) cream [id number] Yes Apply 1 Application topically daily. Douglass Caul B, FNP  Active   digoxin  62.5 MCG TABS [id number] Yes Take 0.0625 mg by mouth daily. Dorinda Drue DASEN, MD  Active   diphenhydrAMINE  (BENADRYL ) 25 MG tablet [id number] Yes Take 25 mg by mouth every 6 (six) hours as needed. [provider]  Active Family Member, Pharmacy Records  divalproex  (DEPAKOTE ) 125 MG DR tablet [id number] Yes TAKE 1 TABLET BY MOUTH TWICE DAILY Jimmy Charlie FERNS, MD  Active Family Member, Pharmacy Records  furosemide  (LASIX ) 40 MG tablet [id number] Yes Take 1 tablet (40 mg total) by mouth daily. Dorinda Drue DASEN, MD  Active   ibuprofen (ADVIL,MOTRIN) 200 MG tablet [id number] Yes Take 200 mg by mouth every 6 (six) hours as needed. [provider]  Active Family Member, Pharmacy Records  latanoprost  (XALATAN ) 0.005 % ophthalmic solution [id number] Yes Place 1 drop into both eyes at bedtime. [provider]  Active Family Member, Pharmacy Records  meclizine  (ANTIVERT ) 25 MG tablet [id number] Yes TAKE ONE TABLET DAILY AS NEEDED Jimmy Charlie FERNS, MD  Active Family Member, Pharmacy Records  methimazole  (TAPAZOLE ) 5 MG tablet [id number] Yes Take 0.5 tablets (2.5 mg total) by mouth daily. Dorinda Drue DASEN, MD  Active   metoprolol  succinate (TOPROL -XL) 25 MG 24 hr tablet [id number] Yes TAKE THREE TABLETS (75MG ) TWICE DAILY Marylynn Verneita CROME, MD  Active   mometasone  (ELOCON ) 0.1 % cream [id number] Yes Apply 1 Application topically 2 (two) times daily as needed (itching). Jimmy Charlie FERNS,  MD  Active Family Member, Pharmacy Records  nystatin  (MYCOSTATIN /NYSTOP ) powder [id number] Yes Apply 1 Application topically 3 (three) times daily. [provider]  Active Family Member, Pharmacy Records  omeprazole  (PRILOSEC ) 20 MG capsule [id number] Yes TAKE 1 CAPSULE BY MOUTH ONCE DAILY Jimmy Charlie FERNS, MD  Active Family Member, Pharmacy Records  polyethylene  glycol (MIRALAX  / GLYCOLAX ) 17 g packet [id number] Yes Take 17 g by mouth daily as needed for mild constipation. Alexander, Natalie, DO  Active Family Member, Pharmacy Records  QUEtiapine  (SEROQUEL ) 25 MG tablet [id number] Yes Take 0.5 tablets (12.5 mg total) by mouth daily after supper. Dorinda Drue DASEN, MD  Active   timolol  (TIMOPTIC ) 0.5 % ophthalmic solution [id number] Yes Place 1 drop into both eyes daily. [provider]  Active Family Member, Pharmacy Records  venlafaxine  (EFFEXOR ) 75 MG tablet [id number] Yes TAKE ONE TABLET BY MOUTH TWICE DAILY Jimmy Charlie FERNS, MD  Active Family Member, Pharmacy Records            Recommendation:   PCP Follow-up Specialty provider follow-up WOUND CARE - please call for appointment Referral to: Palliative Care - [facility] Continue Current Plan of Care  Follow Up Plan:   Telephone follow-up in 1 month  Nestora Duos, MSN, RN [facility], [facility] RN Care Manager Direct Dial: [phone] Fax: [phone]'

## 2024-06-19 NOTE — Patient Instructions (Signed)
 Visit Information  Thank you for taking time to visit with me today. Please don't hesitate to contact me if I can be of assistance to you before our next scheduled appointment.  Your next care management appointment is by telephone on 07/14/2024 at 10:00 am  Telephone follow-up in 1 month  Please call the care guide team at [phone] if you need to cancel, schedule, or reschedule an appointment.   Please call the Suicide and Crisis Lifeline: 988 call the [place]  National Suicide Prevention Lifeline: 903 501 5471 or TTY: 774-409-5444 TTY (646)816-3215) to talk to a trained counselor call 1-800-273-TALK (toll free, 24 hour hotline) go to [facility] [address] [place] [phone]) call 911 if you are experiencing a Mental Health or Behavioral Health Crisis or need someone to talk to.  Nestora Duos, MSN, RN [place]  [facility], [facility] RN Care Manager Direct Dial: [phone] Fax: [phone]   Fall Prevention in the Home, Adult Falls can cause injuries and can happen to people of all ages. There are many things you can do to make your home safer and to help prevent falls. What actions can I take to prevent falls? General information Use good lighting in all rooms. Make sure to: Replace any light bulbs that burn out. Turn on the lights in dark areas and use night-lights. Keep items that you use often in easy-to-reach places. Lower the shelves around your home if needed. Move furniture so that there are clear paths around it. Do not use throw rugs or other things on the floor that can make you trip. If any of your floors are uneven, fix them. Add color or contrast paint or tape to clearly mark and help you see: Grab bars or handrails. First and last steps of staircases. Where the edge of each step is. If you use a ladder or stepladder: Make sure that it is fully opened. Do not climb a closed  ladder. Make sure the sides of the ladder are locked in place. Have someone hold the ladder while you use it. Know where your pets are as you move through your home. What can I do in the bathroom?     Keep the floor dry. Clean up any water on the floor right away. Remove soap buildup in the bathtub or shower. Buildup makes bathtubs and showers slippery. Use non-skid mats or decals on the floor of the bathtub or shower. Attach bath mats securely with double-sided, non-slip rug tape. If you need to sit down in the shower, use a non-slip stool. Install grab bars by the toilet and in the bathtub and shower. Do not use towel bars as grab bars. What can I do in the bedroom? Make sure that you have a light by your bed that is easy to reach. Do not use any sheets or blankets on your bed that hang to the floor. Have a firm chair or bench with side arms that you can use for support when you get dressed. What can I do in the kitchen? Clean up any spills right away. If you need to reach something above you, use a step stool with a grab bar. Keep electrical cords out of the way. Do not use floor polish or wax that makes floors slippery. What can I do with my stairs? Do not leave anything on the stairs. Make sure that you have a light switch at the top and the bottom of the stairs. Make sure that there are  handrails on both sides of the stairs. Fix handrails that are broken or loose. Install non-slip stair treads on all your stairs if they do not have carpet. Avoid having throw rugs at the top or bottom of the stairs. Choose a carpet that does not hide the edge of the steps on the stairs. Make sure that the carpet is firmly attached to the stairs. Fix carpet that is loose or worn. What can I do on the outside of my home? Use bright outdoor lighting. Fix the edges of walkways and driveways and fix any cracks. Clear paths of anything that can make you trip, such as tools or rocks. Add color or  contrast paint or tape to clearly mark and help you see anything that might make you trip as you walk through a door, such as a raised step or threshold. Trim any bushes or trees on paths to your home. Check to see if handrails are loose or broken and that both sides of all steps have handrails. Install guardrails along the edges of any raised decks and porches. Have leaves, snow, or ice cleared regularly. Use sand, salt, or ice melter on paths if you live where there is ice and snow during the winter. Clean up any spills in your garage right away. This includes grease or oil spills. What other actions can I take? Review your medicines with your doctor. Some medicines can cause dizziness or changes in blood pressure, which increase your risk of falling. Wear shoes that: Have a low heel. Do not wear high heels. Have rubber bottoms and are closed at the toe. Feel good on your feet and fit well. Use tools that help you move around if needed. These include: Canes. Walkers. Scooters. Crutches. Ask your doctor what else you can do to help prevent falls. This may include seeing a physical therapist to learn to do exercises to move better and get stronger. Where to find more information Centers for Disease Control and Prevention, STEAD[employer]: TonerPromos.no [employer] on Aging: BaseRingTones.pl National Institute on Aging: BaseRingTones.pl Contact a doctor if: You are afraid of falling at home. You feel weak, drowsy, or dizzy at home. You fall at home. Get help right away if you: Lose consciousness or have trouble moving after a fall. Have a fall that causes a head injury. These symptoms may be an emergency. Get help right away. Call 911. Do not wait to see if the symptoms will go away. Do not drive yourself to the hospital. This information is not intended to replace advice given to you by your health care provider. Make sure you discuss any questions you have with your health care provider. Document  Revised: 05/15/2022 Document Reviewed: 05/15/2022 Elsevier Patient Education  2024 Elsevier Inc.  Cellulitis, Adult  Cellulitis is a skin infection. The infected area is often warm, red, swollen, and sore. It occurs most often on the legs, feet, and toes, but can happen on any part of the body. This condition can be life-threatening without treatment. It is very important to get treated right away. What are the causes? This condition is caused by bacteria. The bacteria enter through a break in the skin, such as: A cut. A burn. A bug bite. An animal bite. An open sore. A crack. What increases the risk? Having a weak body's defense system (immune system). Being older than 79 years old. Having a blood sugar problem (diabetes). Having a long-term liver disease (cirrhosis) or kidney disease. Being very overweight (obese). Having a  skin problem, such as: An itchy rash. A rash caused by a fungus. A rash with blisters. Slow movement of blood in the veins (venous stasis). Fluid buildup below the skin (edema). This condition is more likely to occur in people who: Have open cuts, burns, bites, or scrapes on the skin. Have been treated with high-energy rays (radiation). Use IV drugs. What are the signs or symptoms? Skin that: Looks red or purple, or slightly darker than your usual skin color. Has streaks. Has spots. Is swollen. Is sore or painful when you touch it. Is warm. A fever. Chills. Blisters. Tiredness (fatigue). How is this treated? Medicines to treat infections or allergies. Rest. Placing cold or warm cloths on the skin. Staying in the hospital, if the condition is very bad. You may need medicines through an IV. Follow these instructions at home: Medicines Take over-the-counter and prescription medicines only as told by your doctor. If you were prescribed antibiotics, take them as told by your doctor. Do not stop using them even if you start to feel better. General  instructions Drink enough fluid to keep your pee (urine) pale yellow. Do not touch or rub the infected area. Raise (elevate) the infected area above the level of your heart while you are sitting or lying down. Return to your normal activities when your doctor says that it is safe. Place cold or warm cloths on the area as told by your doctor. Keep all follow-up visits. Your doctor will need to make sure that a more serious infection is not developing. Contact a doctor if: You have a fever. You do not start to get better after 1-2 days of treatment. Your bone or joint under the infected area starts to hurt after the skin has healed. Your infection comes back in the same area or another area. Signs of this may include: You have a swollen bump in the area. Your red area gets larger, turns dark in color, or hurts more. You have more fluid coming from the wound. Pus or a bad smell develops in your infected area. You have more pain. You feel sick and have muscle aches and weakness. You develop vomiting or watery poop that will not go away. Get help right away if: You see red streaks coming from the area. You notice the skin turns purple or black and falls off. These symptoms may be an emergency. Get help right away. Call 911. Do not wait to see if the symptoms will go away. Do not drive yourself to the hospital. This information is not intended to replace advice given to you by your health care provider. Make sure you discuss any questions you have with your health care provider. Document Revised: 05/09/2022 Document Reviewed: 05/09/2022 Elsevier Patient Education  2024 Elsevier Inc.Lymphedema  Lymphedema is swelling that happens when an abnormal amount of lymph collects in the soft tissues under your skin. Lymph is fluid that moves through your lymphatic system. This system: Is part of your body's defense system, also called your immune system. Filters germs and waste from tissues in your  body to your bloodstream. Lymphedema happens when your lymphatic system is blocked. This keeps lymph from draining as it should and leads to swelling. What are the causes? The cause of lymphedema depends on which type you have. Primary lymphedema is when you're born without lymph vessels or with lymph vessels that aren't normal. Secondary lymphedema is more common. It happens when lymph vessels are blocked or damaged from: Infection. Injury. Radiation therapy.  Cancer. Scar tissue that forms. Surgery. What are the signs or symptoms? A swollen arm, leg, feet, toes, or fingers. A heavy or tight feeling in the swollen area. Skin that turns red near the swollen area. Not being able to move your arm or leg. Your arm or leg is sensitive to touch. Discomfort in your arm or leg. How is this diagnosed? Lymphedema may be diagnosed based on: Your symptoms and medical history. A physical exam. Bioimpedance spectroscopy. This test uses painless electrical currents. They help measure fluid levels in your body. Imaging tests, such as: MRI or CT scan. Duplex ultrasound. This test uses sound waves to make pictures on a screen. The pictures show your blood vessels and blood flow. Lymphoscintigraphy. In this test, a low dose of radioactive substance is given through a needle that goes through your skin. The substance traces the flow of lymph through your lymph vessels. Lymphangiography. In this test, a contrast dye is put into your lymph vessel. The dye helps show if the vessel is blocked. How is this treated?  If another condition is causing your lymphedema, that condition will be treated. For example, antibiotics may be used to treat infection. Treatment for lymphedema depends on the cause. Treatment may include: Complete decongestive therapy (CDT). This lowers fluid buildup. CDT includes: Pressure (compression) wrapping of the area. Manual lymph drainage. This helps lymph drain out of your arm or  leg. Certain exercises. These help fluid move out of your arm or leg. Compression. This puts pressure on your arm or leg to lower swelling. It includes: Compression stockings or sleeves. Special bandage wraps. Surgery. This is normally done only for severe cases that don't get better with other treatments. Follow these instructions at home: Self-care Your swollen area is more likely to get hurt or infected. To help prevent infection: Keep the area clean and dry. Use creams or lotions that your health care provider says are okay. These keep your skin moist. Protect your skin from cuts. Use gloves when you cook or garden. Do not walk barefoot. If you shave the area, use an Neurosurgeon. Do not wear tight clothes, shoes, or jewelry. Eat a healthy diet. Eat a lot of fruits and vegetables. Activity Do exercises as told by your provider. Do not sit with your legs crossed. When you can, keep the swollen leg or foot raised above the level of your heart. Avoid using an arm with lymphedema to carry things. General instructions Wear compression stockings or sleeves as told by your provider. Note any changes in size of the swollen arm or leg. You may be told to measure it at set times and track the results. Take over-the-counter and prescription medicines only as told by your provider. If you were prescribed antibiotics, use them as told by your provider. Do not stop using the antibiotic even if you start to feel better or if your condition improves. Do not use heating pads or ice packs on the swollen area. Avoid having your swollen arm or leg used for: Blood draws. IVs. Blood pressure checks. Contact a health care provider if: You get new swelling in your arm or leg all of a sudden. Fluid leaks from the skin of your swollen arm or leg. You have a cut that doesn't heal. The swollen area hurts or turns red. You get purple spots, a rash, blisters, or sores on your swollen arm or leg. You  have a fever or chills. This information is not intended to replace advice given  to you by your health care provider. Make sure you discuss any questions you have with your health care provider. Document Revised: 12/06/2022 Document Reviewed: 12/06/2022 Elsevier Patient Education  2024 Elsevier Inc.   Managing Depression, Adult Depression is a mental health condition that affects your thoughts, feelings, and actions. Being diagnosed with depression can bring you relief if you did not know why you have felt or behaved a certain way. It could also leave you feeling overwhelmed. Finding ways to manage your symptoms can help you feel more positive about your future. How to manage lifestyle changes Being depressed is difficult. Depression can increase the level of everyday stress. Stress can make depression symptoms worse. You may believe your symptoms cannot be managed or will never improve. However, there are many things you can try to help manage your symptoms. There is hope. Managing stress  Stress is your body's reaction to life changes and events, both good and bad. Stress can add to your feelings of depression. Learning to manage your stress can help lessen your feelings of depression. Try some of the following approaches to reducing your stress (stress reduction techniques): Listen to music that you enjoy and that inspires you. Try using a meditation app or take a meditation class. Develop a practice that helps you connect with your spiritual self. Walk in nature, pray, or go to a place of worship. Practice deep breathing. To do this, inhale slowly through your nose. Pause at the top of your inhale for a few seconds and then exhale slowly, letting yourself relax. Repeat this three or four times. Practice yoga to help relax and work your muscles. Choose a stress reduction technique that works for you. These techniques take time and practice to develop. Set aside 5-15 minutes a day to do them.  Therapists can offer training in these techniques. Do these things to help manage stress: Keep a journal. Know your limits. Set healthy boundaries for yourself and others, such as saying no when you think something is too much. Pay attention to how you react to certain situations. You may not be able to control everything, but you can change your reaction. Add humor to your life by watching funny movies or shows. Make time for activities that you enjoy and that relax you. Spend less time using electronics, especially at night before bed. The light from screens can make your brain think it is time to get up rather than go to bed.  Medicines Medicines, such as antidepressants, are often a part of treatment for depression. Talk with your pharmacist or health care provider about all the medicines, supplements, and herbal products that you take, their possible side effects, and what medicines and other products are safe to take together. Make sure to report any side effects you may have to your health care provider. Relationships Your health care provider may suggest family therapy, couples therapy, or individual therapy as part of your treatment. How to recognize changes Everyone responds differently to treatment for depression. As you recover from depression, you may start to: Have more interest in doing activities. Feel more hopeful. Have more energy. Eat a more regular amount of food. Have better mental focus. It is important to recognize if your depression is not getting better or is getting worse. The symptoms you had in the beginning may return, such as: Feeling tired. Eating too much or too little. Sleeping too much or too little. Feeling restless, agitated, or hopeless. Trouble focusing or making decisions. Having unexplained  aches and pains. Feeling irritable, angry, or aggressive. If you or your family members notice these symptoms coming back, let your health care provider know  right away. Follow these instructions at home: Activity Try to get some form of exercise each day, such as walking. Try yoga, mindfulness, or other stress reduction techniques. Participate in group activities if you are able. Lifestyle Get enough sleep. Cut down on or stop using caffeine, tobacco, alcohol, and any other harmful substances. Eat a healthy diet that includes plenty of vegetables, fruits, whole grains, low-fat dairy products, and lean protein. Limit foods that are high in solid fats, added sugar, or salt (sodium). General instructions Take over-the-counter and prescription medicines only as told by your health care provider. Keep all follow-up visits. It is important for your health care provider to check on your mood, behavior, and medicines. Your health care provider may need to make changes to your treatment. Where to find support Talking to others  Friends and family members can be sources of support and guidance. Talk to trusted friends or family members about your condition. Explain your symptoms and let them know that you are working with a health care provider to treat your depression. Tell friends and family how they can help. Finances Find mental health providers that fit with your financial situation. Talk with your health care provider if you are worried about access to food, housing, or medicine. Call your insurance company to learn about your co-pays and prescription plan. Where to find more information You can find support in your area from: Anxiety and Depression Association of America (ADAA): adaa.org Mental Health America: mentalhealthamerica.net [employer] on Mental Illness: nami.org Contact a health care provider if: You stop taking your antidepressant medicines, and you have any of these symptoms: Nausea. Headache. Light-headedness. Chills and body aches. Not being able to sleep (insomnia). You or your friends and family think your depression  is getting worse. Get help right away if: You have thoughts of hurting yourself or others. Get help right away if you feel like you may hurt yourself or others, or have thoughts about taking your own life. Go to your nearest emergency room or: Call 911. Call the National Suicide Prevention Lifeline at (805) 323-8622 or 988. This is open 24 hours a day. Text the Crisis Text Line at (609)452-4537. This information is not intended to replace advice given to you by your health care provider. Make sure you discuss any questions you have with your health care provider. Document Revised: 01/17/2022 Document Reviewed: 01/17/2022 Elsevier Patient Education  2024 [employer].

## 2024-06-19 NOTE — Telephone Encounter (Signed)
 Rapid A-fib 190 bpm lasting 60 sec  P. 12-14 strip 3  Printed report given to provider

## 2024-06-19 NOTE — Telephone Encounter (Signed)
 I Have received a ZIO by Fax and Handed it to Jon Eliberto PEAK

## 2024-06-20 ENCOUNTER — Ambulatory Visit: Payer: Self-pay | Admitting: Internal Medicine

## 2024-06-20 ENCOUNTER — Telehealth: Payer: Self-pay | Admitting: Internal Medicine

## 2024-06-20 MED ORDER — METOPROLOL SUCCINATE ER 100 MG PO TB24: 100.0000 mg | ORAL_TABLET | Freq: Two times a day (BID) | ORAL | 0 refills | Status: DC

## 2024-06-20 NOTE — Telephone Encounter (Signed)
 Copied from CRM #[id number]. Topic: General - Other >> Jun 19, 2024  3:54 PM Robinson H wrote: Reason for RMF:Gldu FYI that [facility] will be following patient for Palliative services  Allean [phone]

## 2024-06-20 NOTE — Addendum Note (Signed)
 Addended by: MARYLYNN VERNEITA CROME on: 06/20/2024 08:16 AM   Modules accepted: Orders

## 2024-06-23 ENCOUNTER — Telehealth: Payer: Self-pay | Admitting: Nurse Practitioner

## 2024-06-23 NOTE — Telephone Encounter (Signed)
 Unfortunately, if she has been having rapid atrial fibrillation, I think a formal physical exam is necessary in order to fully evaluate her and determine next steps.

## 2024-06-23 NOTE — Telephone Encounter (Signed)
 Pts sister calling to ask if pts appot could be virtual. She states pt has lost almost all mobility. Please advise.

## 2024-06-23 NOTE — Telephone Encounter (Signed)
 Left a message for the patient's sister to call back.

## 2024-06-24 NOTE — Telephone Encounter (Signed)
 The sister has been made aware and will try to bring the patient. She will arrive by transport

## 2024-06-25 ENCOUNTER — Ambulatory Visit: Attending: Nurse Practitioner | Admitting: Nurse Practitioner

## 2024-06-25 ENCOUNTER — Encounter: Payer: Self-pay | Admitting: Nurse Practitioner

## 2024-06-25 VITALS — BP 95/80 | HR 97 | Ht 63.0 in | Wt 135.0 lb

## 2024-06-25 DIAGNOSIS — I4819 Other persistent atrial fibrillation: Secondary | ICD-10-CM

## 2024-06-25 DIAGNOSIS — E0590 Thyrotoxicosis, unspecified without thyrotoxic crisis or storm: Secondary | ICD-10-CM

## 2024-06-25 DIAGNOSIS — I5032 Chronic diastolic (congestive) heart failure: Secondary | ICD-10-CM

## 2024-06-25 DIAGNOSIS — I10 Essential (primary) hypertension: Secondary | ICD-10-CM | POA: Diagnosis not present

## 2024-06-25 MED ORDER — FUROSEMIDE 20 MG PO TABS: 20.0000 mg | ORAL_TABLET | Freq: Every day | ORAL | 11 refills | Status: DC

## 2024-06-25 NOTE — Patient Instructions (Signed)
 Medication Instructions:  Your physician recommends the following medication changes.  STOP TAKING: Digoxin   START TAKING: Lasix  20 mg daily  *If you need a refill on your cardiac medications before your next appointment, please call your pharmacy*  Lab Work: None ordered at this time   Follow-Up: At [facility], you and your health needs are our priority.  As part of our continuing mission to provide you with exceptional heart care, our providers are all part of one team.  This team includes your primary Cardiologist (physician) and Advanced Practice Providers or APPs (Physician Assistants and Nurse Practitioners) who all work together to provide you with the care you need, when you need it.  Your next appointment:   3-4 week(s)  Provider:   You may see Deatrice Cage, MD or Lonni Meager, NP

## 2024-06-25 NOTE — Progress Notes (Signed)
 Office Visit    Patient Name: Alexandria Sherman Date of Encounter: 06/25/2024  Primary Care Provider:  Marylynn Verneita CROME, MD Primary Cardiologist:  Deatrice Cage, MD    Chief Complaint    78 y.o. female with a history of coronary artery calcifications on CT imaging, chronic sinus tachycardia, hypertension, hyperlipidemia, chronic venous insufficiency/lymphedema, anxiety, depression, GERD, hyperthyroidism (diagnosed August 2025), who presents for follow-up in the setting of recently diagnosed atrial fibrillation.  Past Medical History   Subjective   Past Medical History:  Diagnosis Date   Acute lateral meniscus tear of right knee    Anxiety disorder, unspecified    Atrial fibrillation with rapid ventricular response (HCC) 05/11/2024   Cataract [facility]   Clotting disorder blood circulation/veins in feet and lower legs   Depression 1978   Diverticulosis of colon 04/2005   GERD (gastroesophageal reflux disease)    Glaucoma [facility]   Hypertension    Hyperthyroidism    Dr Damian   Impaired fasting glucose    Low TSH level    chronic low, but euthyroid   Microscopic hematuria    RBBB    S/p left hip fracture    Sepsis (HCC) 05/14/2024   Sleep disturbance    Subclinical hyperthyroidism    unspecified   Tibia fracture 05/2010   left   Past Surgical History:  Procedure Laterality Date   ABDOMINAL HYSTERECTOMY  1988   hysterectomy/ BSO for endometriosis   BREAST BIOPSY Right 1992   neg   BREAST EXCISIONAL BIOPSY Right 1988   benign per pt   INTRAMEDULLARY (IM) NAIL INTERTROCHANTERIC Left 01/15/2022   Procedure: INTRAMEDULLARY (IM) NAIL INTERTROCHANTRIC;  Surgeon: Cleotilde Barrio, MD;  Location: [facility] ORS;  Service: Orthopedics;  Laterality: Left;   KNEE ARTHROSCOPY  6/12   left--Dr Ernie    Allergies  Allergies  Allergen Reactions   Doxycycline  Other (See Comments)    Hallucinations, nocturnal:  In the night felt like she was flipping in the  bed.   Augmentin [Amoxicillin-Pot Clavulanate] Other (See Comments)    Pt cannot remember   Cephalexin     REACTION: vaginal blisters Tolerates penicillins   Codeine Phosphate     REACTION: vomiting   Promethazine Hcl     REACTION: nerves       History of Present Illness      78 y.o. y/o female with above past medical history including coronary artery calcifications on CT imaging, chronic sinus tachycardia, hypertension, hyperlipidemia, chronic venous insufficiency/lymphedema, anxiety, depression, GERD, hyperthyroidism (diagnosed August 2025), and persistent atrial fibrillation.  Previously evaluated in early 2020 in the setting of lower extremity edema.  ABIs were normal.  Venous Dopplers were negative.  Echo showed normal LV function.  She was managed for chronic venous stasis with compression socks and lymphedema pump.  In August 2025, she was admitted to [facility] regional with a 1 to 2-week history of weakness, dysuria, and dyspnea.  She was found to be in A-fib with RVR on arrival to the emergency department.  CT of the chest was negative for PE though coronary calcifications, possible pulmonary hypertension, left upper lobe pulmonary nodule, 14 mm left adrenal adenoma, and multinodular thyroid  gland with 1.5 cm left thyroid  nodule were noted.  She was found to have a UTI and was admitted and treated for sepsis.  In the setting of hyperthyroidism and atrial fibrillation, she was placed on beta-blocker, digoxin , and Eliquis  therapy.  Echocardiogram showed an EF of 50-55% without  regional wall motion abnormalities, normal RV function, and mild MR.  Heart rates improved significantly w/ medical therapy and DCCV was deferred at least until after she fully recovered from sepsis and hyperthyroidism was adequately managed.  She was discharged home on methimazole .  Following discharge, a ZIO monitor was placed and showed 100% atrial fibrillation burden with an average rate of 106 bpm (55-2 and 21), and  occasional PVCs (1.6%).   Patient presents today accompanied by family friend.  She has been out of digoxin  and furosemide  for 2 weeks.  Despite this, she has been feeling well.  Heart rate is 97 today.  She remains in atrial fibrillation but is asymptomatic.  At home, activity is very limited.  She requires assistance for any transfers and has aides coming in for hours in the morning and then again for 4 hours in the evening.  She continues to wear oxygen, which was placed at the time of hospital discharge.  She has some degree of chronic dyspnea and fatigue but denies chest pain, palpitations, PND, orthopnea, dizziness, syncope, edema, or early satiety. Objective   Home Medications    Current Outpatient Medications  Medication Sig Dispense Refill   apixaban  (ELIQUIS ) 5 MG TABS tablet Take 1 tablet (5 mg total) by mouth 2 (two) times daily. 60 tablet 3   clorazepate  (TRANXENE ) 3.75 MG tablet TAKE 1 TABLET BY MOUTH 3 TIMES DAILY AS NEEDED FOR ANXIETY. 90 tablet 0   clotrimazole -betamethasone  (LOTRISONE ) cream Apply 1 Application topically daily. 30 g 0   diphenhydrAMINE  (BENADRYL ) 25 MG tablet Take 25 mg by mouth every 6 (six) hours as needed.     divalproex  (DEPAKOTE ) 125 MG DR tablet TAKE 1 TABLET BY MOUTH TWICE DAILY 180 tablet 3   ibuprofen (ADVIL,MOTRIN) 200 MG tablet Take 200 mg by mouth every 6 (six) hours as needed.     latanoprost  (XALATAN ) 0.005 % ophthalmic solution Place 1 drop into both eyes at bedtime.     meclizine  (ANTIVERT ) 25 MG tablet TAKE ONE TABLET DAILY AS NEEDED 90 tablet 1   methimazole  (TAPAZOLE ) 5 MG tablet Take 0.5 tablets (2.5 mg total) by mouth daily. 30 tablet 0   metoprolol  succinate (TOPROL -XL) 100 MG 24 hr tablet Take 1 tablet (100 mg total) by mouth 2 (two) times daily. Take with or immediately following a meal. 180 tablet 0   mometasone  (ELOCON ) 0.1 % cream Apply 1 Application topically 2 (two) times daily as needed (itching). 45 g 1   nystatin   (MYCOSTATIN /NYSTOP ) powder Apply 1 Application topically 3 (three) times daily.     omeprazole  (PRILOSEC ) 20 MG capsule TAKE 1 CAPSULE BY MOUTH ONCE DAILY 90 capsule 3   polyethylene glycol (MIRALAX  / GLYCOLAX ) 17 g packet Take 17 g by mouth daily as needed for mild constipation. 14 each 0   QUEtiapine  (SEROQUEL ) 25 MG tablet Take 0.5 tablets (12.5 mg total) by mouth daily after supper. 30 tablet 0   timolol  (TIMOPTIC ) 0.5 % ophthalmic solution Place 1 drop into both eyes daily.     venlafaxine  (EFFEXOR ) 75 MG tablet TAKE ONE TABLET BY MOUTH TWICE DAILY 180 tablet 3   furosemide  (LASIX ) 20 MG tablet Take 1 tablet (20 mg total) by mouth daily. 30 tablet 11   No current facility-administered medications for this visit.     Physical Exam    VS:  BP 95/80 (BP Location: Right Arm, Patient Position: Sitting, Cuff Size: Normal)   Pulse 97   Ht 5' 3 (1.6 m)  Wt 135 lb (61.2 kg)   SpO2 98%   BMI 23.91 kg/m  , BMI Body mass index is 23.91 kg/m.       **Today's wt is pts best guess.  She has not weighed since hosp d/c.   GEN: Well nourished, well developed, in no acute distress. HEENT: normal. Neck: Supple, no JVD, carotid bruits, or masses. Cardiac: Irregularly irregular, somewhat distant, no murmurs, rubs, or gallops.  Lower legs are discolored consistent with chronic venous stasis.  No clubbing, cyanosis, edema.  Radials 2+/PT 2+ and equal bilaterally.  Respiratory:  Respirations regular and unlabored, diminished breath sounds bilaterally. GI: Soft, nontender, nondistended, BS + x 4. MS: no deformity or atrophy. Skin: warm and dry, no rash. Neuro:  Strength and sensation are intact. Psych: Normal affect.  Accessory Clinical Findings    ECG personally reviewed by me today - EKG Interpretation Date/Time:  Wednesday June 25 2024 15:43:19 EDT Ventricular Rate:  97 PR Interval:    QRS Duration:  122 QT Interval:  368 QTC Calculation: 467 R Axis:   268  Text  Interpretation: Atrial fibrillation Baseline artifact Right bundle branch block Inferior infarct , age undetermined Anterolateral infarct , age undetermined Confirmed by Vivienne Bruckner [phone]) on 06/25/2024 4:23:52 PM  - no acute changes.  Lab Results  Component Value Date   WBC 11.4 (H) 05/18/2024   HGB 12.5 05/18/2024   HCT 40.6 05/18/2024   MCV 98.8 05/18/2024   PLT 239 05/18/2024   Lab Results  Component Value Date   CREATININE 0.35 (L) 05/21/2024   BUN 17 05/21/2024   NA 138 05/21/2024   K 4.3 05/21/2024   CL 86 (L) 05/21/2024   CO2 39 (H) 05/21/2024   Lab Results  Component Value Date   ALT 11 05/20/2024   AST 12 (L) 05/20/2024   ALKPHOS 52 05/20/2024   BILITOT 0.8 05/20/2024   Lab Results  Component Value Date   CHOL 112 05/12/2024   HDL 31 (L) 05/12/2024   LDLCALC 65 05/12/2024   LDLDIRECT 140.8 06/15/2009   TRIG 78 05/12/2024   CHOLHDL 3.6 05/12/2024    Lab Results  Component Value Date   HGBA1C 5.7 (H) 05/12/2024   Lab Results  Component Value Date   TSH 0.027 (L) 05/10/2024   Free T4 0.45 - 05/20/2024    Assessment & Plan    1.  Atrial fibrillation with rapid ventricular response: Patient admitted to [facility] August 2025 with UTI, sepsis, weakness, and hyperthyroidism.  She was seen by our team and placed on beta-blocker and digoxin  therapy as well as Eliquis .  Rhythm management was deferred in the setting of ongoing treatment for hyperthyroidism and multiple thyroid  nodules.  Since hospital discharge, patient has not been experiencing palpitations.  She has ongoing weakness as well as chronic dyspnea and debilitation, largely relegated to a chair/wheelchair or bed.  She has been out of digoxin  for at least 2 weeks.  Recent event monitoring showed 100% A-fib burden with an average rate of 105 bpm.  Beta-blocker therapy has since been increased to 100 mg twice daily and rate is 97 today, and she is asymptomatic.  We discussed plans for rhythm  management to include cardioversion once hyperthyroidism better controlled as likelihood of ERA following cardioversion is highF if she remains markedly hyperthyroid.  She has been managed with methimazole  and has plans for follow-up labs through primary care and just shy of 2 weeks.  Continue current dose of beta-blocker and Eliquis .  Provided that TSH/free T4 improved at follow-up, we agreed to reconvene to discuss cardioversion in 3 to 4 weeks.  2.  Primary hypertension: Blood pressure soft but stable at 95/80.  Asymptomatic.  3.  Chronic respiratory failure: Patient discharged on home O2, currently wearing 2 L and saturating at 98%.  Diminished breath sounds on examination.  Resuming low dose lasix .  4.  Chronic HFpEF: Patient was volume overloaded during her hospitalization in the setting of rapid A-fib/hyperthyroidism.  EF 50-55% by echo during hospitalization.  She was discharged home on Lasix  40 mg daily but has been out of this for 2 weeks.  She is not able to weigh herself because she is unable to stand.  She appears euvolemic on examination without any lower extremity edema despite significant history of venous stasis.  She has been doing a good job keeping her legs elevated.  As noted above, she does have diminished breath sounds on exam.  I offered to follow-up lab work today however patient/caregiver wished to defer due to time demands and plan for follow-up labs through PCP in 2 weeks.  I refilled Lasix  but at 20 mg daily.  Recommend follow-up basic metabolic panel at the time of lab work in 2 weeks.  5.  Hyperthyroidism: Diagnosed in mid August 2025.  TSH is 0.27 with free T4 of 0.45 at the time of hospital discharge.  On methimazole  currently followed by primary care with plan for follow-up labs in approximately 2 weeks.  Referred to endocrinology but does not currently have an appointment.  Deferring cardioversion until hyperthyroidism stable.  6.  Disposition: Follow-up in 3 to 4 weeks  to discuss cardioversion if appropriate.  Lonni Meager, NP 06/25/2024, 6:33 PM

## 2024-06-30 ENCOUNTER — Ambulatory Visit: Payer: Self-pay

## 2024-06-30 DIAGNOSIS — R300 Dysuria: Secondary | ICD-10-CM

## 2024-06-30 NOTE — Telephone Encounter (Signed)
 Caregiver, Candy, called to see if she could come get a urine specimen cup and bring pt's specimen back in the morning. Candy stated that pt is having some confusion, burning with urination and SOBr with supplemental oxygen.

## 2024-06-30 NOTE — Telephone Encounter (Signed)
 Spoke with Candy the caregiver and she stated that she will come by in the morning to pick up the cup and return it tomorrow.

## 2024-06-30 NOTE — Telephone Encounter (Signed)
 FYI Only or Action Required?: Action required by provider: clinical question for provider and update on patient condition.  Patient was last seen in primary care on 05/28/2024 by Marylynn Verneita CROME, MD.  Called Nurse Triage reporting Dysuria, Breathing Problem, and Altered Mental Status.  Symptoms began several days ago.  Interventions attempted: Nothing.  Symptoms are: confusion (patient called caregiver thinking it was the wrong time and caregivers were supposed to show up; oriented to herself and to friends names); burning with urination; mild SOB on oxygen intermittent gradually worsening.  Triage Disposition: See HCP Within 4 Hours (Or PCP Triage)  Patient/caregiver understands and will follow disposition?: No, wishes to speak with PCP          Copied from CRM #[id number]. Topic: Clinical - Red Word Triage >> Jun 30, 2024  1:19 PM [place] E wrote: [facility] that prompted transfer to Nurse Triage: Potential UTI, patient has been complaining of a burning sensation when urinating along with labored breathing, patient is on oxygen. Patient is also disoriented and confused. Caregiver, Candy, on the line. Reason for Disposition  [1] MILD difficulty breathing (e.g., minimal/no SOB at rest, SOB with walking, pulse < 100) AND [2] NEW-onset or WORSE than normal  Answer Assessment - Initial Assessment Questions Patient told her caregiver, Rowena, it's too much work for her to come in to the office. Rowena is asking if she can come by the office and pick up a urine sample cup and they can collect it tomorrow morning and bring it back.  1. RESPIRATORY STATUS: Describe your breathing? (e.g., wheezing, shortness of breath, unable to speak, severe coughing)      Labored breathing. She states the patient has been on oxygen for a couple of months. 2L oxygen.  2. ONSET: When did this breathing problem begin?      Today.  3. PATTERN Does the difficult breathing come and go, or has it been  constant since it started?      Comes and goes.  4. SEVERITY: How bad is your breathing? (e.g., mild, moderate, severe)      Mild compared to when she was hospitalized.  5. RECURRENT SYMPTOM: Have you had difficulty breathing before? If Yes, ask: When was the last time? and What happened that time?      Back when she was hospitalized in August she was having SOB/heavy breathing and was put on oxygen.  6. CARDIAC HISTORY: Do you have any history of heart disease? (e.g., heart attack, angina, bypass surgery, angioplasty)       Chronic diastolic CHF (congestive heart failure) (HCC) RBBB Chronic venous insufficiency Atrial fibrillation (HCC) Chronic atrial fibrillation (HCC) Acute heart failure with preserved ejection fraction (HCC)   7. LUNG HISTORY: Do you have any history of lung disease?  (e.g., pulmonary embolus, asthma, emphysema)     No.  8. CAUSE: What do you think is causing the breathing problem?      UTI.  9. OTHER SYMPTOMS: Do you have any other symptoms? (e.g., chest pain, cough, dizziness, fever, runny nose)     Burning with urination, confusion (oriented to self, friend's names; confused about what time and who is supposed to be with her/situation and times). Denies fever, chest pain, back pain, night sweats, coughing, abdominal pain.  10. O2 SATURATION MONITOR:  Do you use an oxygen saturation monitor (pulse oximeter) at home? If Yes, ask: What is your reading (oxygen level) today? What is your usual oxygen saturation reading? (e.g., 95%)  No.  11. PREGNANCY: Is there any chance you are pregnant? When was your last menstrual period?       N/A.  12. TRAVEL: Have you traveled out of the country in the last month? (e.g., travel history, exposures)       N/A.  Protocols used: Breathing Difficulty-A-AH

## 2024-07-01 ENCOUNTER — Other Ambulatory Visit

## 2024-07-01 DIAGNOSIS — R300 Dysuria: Secondary | ICD-10-CM

## 2024-07-02 LAB — MICROSCOPIC EXAMINATION
Casts: NONE SEEN /LPF
Epithelial Cells (non renal): >10 /HPF — AB (ref 0–10)
RBC, Urine: >30 /HPF — AB (ref 0–2)
WBC, UA: >30 /HPF — AB (ref 0–5)

## 2024-07-02 LAB — URINALYSIS, ROUTINE W REFLEX MICROSCOPIC
Glucose, UA: NEGATIVE
Nitrite, UA: POSITIVE — AB
Specific Gravity, UA: 1.018 (ref 1.005–1.030)
Urobilinogen, Ur: 2 mg/dL — ABNORMAL HIGH (ref 0.2–1.0)
pH, UA: 5.5 (ref 5.0–7.5)

## 2024-07-02 NOTE — Telephone Encounter (Signed)
 Per Dr Marylynn:  Marylynn Verneita CROME, MD to Harriette Raisin, [facility]      06/30/24  2:56 PM Yes, ok to give sample cup to aide for collection .  Needs 15 minute virtual visit in 48-72 hours   No appt has been scheduled to go over results yet. Pt needs a 15 min virtual visit in the next day or two.

## 2024-07-02 NOTE — Telephone Encounter (Signed)
 Can I add her on tomorrow after your 12:30 acute visit?

## 2024-07-02 NOTE — Telephone Encounter (Unsigned)
 Copied from CRM #[id number]. Topic: General - Other >> Jul 02, 2024  9:12 AM [place] A wrote: Reason for CRM: Patient's caregiver called to check results for Urine sample however she is not on [facility]

## 2024-07-02 NOTE — Telephone Encounter (Signed)
 Pt has been scheduled for a virtual tomorrow at 8:30

## 2024-07-03 ENCOUNTER — Encounter: Payer: Self-pay | Admitting: Internal Medicine

## 2024-07-03 ENCOUNTER — Telehealth: Admitting: Internal Medicine

## 2024-07-03 VITALS — Ht 63.0 in | Wt 135.0 lb

## 2024-07-03 DIAGNOSIS — R410 Disorientation, unspecified: Secondary | ICD-10-CM

## 2024-07-03 DIAGNOSIS — Z7189 Other specified counseling: Secondary | ICD-10-CM

## 2024-07-03 LAB — URINE CULTURE

## 2024-07-03 MED ORDER — OMEPRAZOLE 20 MG PO CPDR: 20.0000 mg | DELAYED_RELEASE_CAPSULE | Freq: Every day | ORAL | 3 refills | Status: DC

## 2024-07-03 MED ORDER — DIVALPROEX SODIUM 125 MG PO DR TAB: 125.0000 mg | DELAYED_RELEASE_TABLET | Freq: Two times a day (BID) | ORAL | 3 refills | Status: DC

## 2024-07-03 MED ORDER — METOPROLOL SUCCINATE ER 100 MG PO TB24: 100.0000 mg | ORAL_TABLET | Freq: Two times a day (BID) | ORAL | 0 refills | Status: DC

## 2024-07-03 MED ORDER — VENLAFAXINE HCL 75 MG PO TABS: 75.0000 mg | ORAL_TABLET | Freq: Two times a day (BID) | ORAL | 3 refills | Status: DC

## 2024-07-03 NOTE — Assessment & Plan Note (Signed)
DNR status has been requested by patient after risks and benefits of the order were discussed today .  Order given to patient and placed in chart. ?

## 2024-07-03 NOTE — Assessment & Plan Note (Addendum)
 Etiology unclear but includes pyelonephritis, hypoxema, hypercarbia,  renal failure.  Patient has been taking digoxin  since August hospitalization for rhythm management ad may have a supratherapuetic level given her lack of adequate hydration reported by staff. Advised aide to take patient to ER for further evaluation

## 2024-07-03 NOTE — Progress Notes (Signed)
 Virtual Visit via Caregility   Note   This format is felt to be most appropriate for this patient at this time.  All issues noted in this document were discussed and addressed.  No physical exam was performed (except for noted visual exam findings with Video Visits).   I connected with Jaylah  on 07/03/24 at  8:30 AM EDT by a video enabled telemedicine applicationand verified that I am speaking with the correct person using two identifiers. Location patient: home Location provider: work or home office Persons participating in the virtual visit: patient, provider  I discussed the limitations, risks, security and privacy concerns of performing an evaluation and management service by telephone and the availability of in person appointments. I also discussed with the patient that there may be a patient responsible charge related to this service. The patient expressed understanding and agreed to proceed.  Reason for visit: delirium  HPI:  78 yr old female with   Mild cognitive impairment, chronic lower extremity lymphedema and weakness resulting in Nonambulatory state since her hip fracture years ago , subclinical hyperthyroidism , with recent admission to  [facility] on August 16  with sepsis secondary to  pyuria and new onset atrial fib with RVR presents with dysuria and new onset hallucinations that started  3 days ago.patient lives alone and is nonambulatory . She has 2  personal aides that provide supervision and assistance from 8 am to 8:30 pm , at which time she is readied for bed and in bed when the aide leaves  The current attending aide notes that for hte last 3 days she has been having visual hallucinations.  The patient reports that she is in a strange place with strange people and cannot leave.  She has been wearing supplemental oxygent at 2 L/min since her hospital discharge in late August but has not had overnight pulse oximetry and her current 02 status is not known. She is describing a dull  pain on the right side of her back.  Her appetite has been fair;  per staff she has had minimal hydration (16 ounces of water daily ,  some juice) since discharge.    Current UA obtained on Oct 7 notes pyuria, microscopic hematuria,  bacteria and urobilinogen, ; however the  urine culture is growing 50 to 100K of urogenital flora   ROS: See pertinent positives and negatives per HPI.  Past Medical History:  Diagnosis Date   Acute lateral meniscus tear of right knee    Anxiety disorder, unspecified    Atrial fibrillation with rapid ventricular response (HCC) 05/11/2024   Cataract [facility]   Clotting disorder blood circulation/veins in feet and lower legs   Depression 1978   Diverticulosis of colon 04/2005   GERD (gastroesophageal reflux disease)    Glaucoma [facility]   Hypertension    Hyperthyroidism    Dr Damian   Impaired fasting glucose    Low TSH level    chronic low, but euthyroid   Microscopic hematuria    Multiple thyroid  nodules    Persistent atrial fibrillation (HCC)    RBBB    S/p left hip fracture    Sepsis (HCC) 05/14/2024   Sleep disturbance    Subclinical hyperthyroidism    unspecified   Tibia fracture 05/2010   left    Past Surgical History:  Procedure Laterality Date   ABDOMINAL HYSTERECTOMY  1988   hysterectomy/ BSO for endometriosis   BREAST BIOPSY Right 1992   neg  BREAST EXCISIONAL BIOPSY Right 1988   benign per pt   INTRAMEDULLARY (IM) NAIL INTERTROCHANTERIC Left 01/15/2022   Procedure: INTRAMEDULLARY (IM) NAIL INTERTROCHANTRIC;  Surgeon: Cleotilde Barrio, MD;  Location: [facility] ORS;  Service: Orthopedics;  Laterality: Left;   KNEE ARTHROSCOPY  6/12   left--Dr Ernie    Family History  Problem Relation Age of Onset   Cancer Mother        lung   Diabetes Mother    Depression Mother    Depression Sister    Parkinson's disease Sister    Cancer Sister    Depression Sister    Stroke Other    Heart attack Other    Cancer Other     Dementia Other    Breast cancer Neg Hx    Thyroid  disease Neg Hx     SOCIAL HX: lives alone with 12 hours of personal aides from 8 am to 8:30   Current Outpatient Medications:    apixaban  (ELIQUIS ) 5 MG TABS tablet, Take 1 tablet (5 mg total) by mouth 2 (two) times daily., Disp: 60 tablet, Rfl: 3   clorazepate  (TRANXENE ) 3.75 MG tablet, TAKE 1 TABLET BY MOUTH 3 TIMES DAILY AS NEEDED FOR ANXIETY., Disp: 90 tablet, Rfl: 0   clotrimazole -betamethasone  (LOTRISONE ) cream, Apply 1 Application topically daily., Disp: 30 g, Rfl: 0   diphenhydrAMINE  (BENADRYL ) 25 MG tablet, Take 25 mg by mouth every 6 (six) hours as needed., Disp: , Rfl:    furosemide  (LASIX ) 20 MG tablet, Take 1 tablet (20 mg total) by mouth daily., Disp: 30 tablet, Rfl: 11   ibuprofen (ADVIL,MOTRIN) 200 MG tablet, Take 200 mg by mouth every 6 (six) hours as needed., Disp: , Rfl:    latanoprost  (XALATAN ) 0.005 % ophthalmic solution, Place 1 drop into both eyes at bedtime., Disp: , Rfl:    meclizine  (ANTIVERT ) 25 MG tablet, TAKE ONE TABLET DAILY AS NEEDED, Disp: 90 tablet, Rfl: 1   methimazole  (TAPAZOLE ) 5 MG tablet, Take 0.5 tablets (2.5 mg total) by mouth daily., Disp: 30 tablet, Rfl: 0   mometasone  (ELOCON ) 0.1 % cream, Apply 1 Application topically 2 (two) times daily as needed (itching)., Disp: 45 g, Rfl: 1   nystatin  (MYCOSTATIN /NYSTOP ) powder, Apply 1 Application topically 3 (three) times daily., Disp: , Rfl:    polyethylene glycol (MIRALAX  / GLYCOLAX ) 17 g packet, Take 17 g by mouth daily as needed for mild constipation., Disp: 14 each, Rfl: 0   QUEtiapine  (SEROQUEL ) 25 MG tablet, Take 0.5 tablets (12.5 mg total) by mouth daily after supper., Disp: 30 tablet, Rfl: 0   timolol  (TIMOPTIC ) 0.5 % ophthalmic solution, Place 1 drop into both eyes daily., Disp: , Rfl:    divalproex  (DEPAKOTE ) 125 MG DR tablet, Take 1 tablet (125 mg total) by mouth 2 (two) times daily., Disp: 180 tablet, Rfl: 3   metoprolol  succinate (TOPROL -XL)  100 MG 24 hr tablet, Take 1 tablet (100 mg total) by mouth 2 (two) times daily. Take with or immediately following a meal., Disp: 180 tablet, Rfl: 0   omeprazole  (PRILOSEC ) 20 MG capsule, Take 1 capsule (20 mg total) by mouth daily., Disp: 90 capsule, Rfl: 3   venlafaxine  (EFFEXOR ) 75 MG tablet, Take 1 tablet (75 mg total) by mouth 2 (two) times daily., Disp: 180 tablet, Rfl: 3  EXAM:  VITALS per patient if applicable:  GENERAL: alert, onfwearing supplemental oxygen,  sitting up in hospital bed ,  no acute distress  HEENT: atraumatic, conjunttiva clear, no obvious abnormalities on inspection  of external nose and ears  NECK: normal movements of the head and neck  LUNGS: on inspection no signs of respiratory distress, breathing rate appears normal, no obvious gross SOB, gasping or wheezing  CV: no obvious cyanosis  MS: moves all visible extremities without noticeable abnormality  PSYCH/NEURO: pleasant and cooperative, no obvious depression or anxiety, speech and thought processing grossly intact  ASSESSMENT AND PLAN: Delirium Assessment & Plan: Etiology unclear but includes pyelonephritis, hypoxema, hypercarbia,  renal failure.  Patient has been taking digoxin  since August hospitalization for rhythm management ad may have a supratherapuetic level given her lack of adequate hydration reported by staff. Advised aide to take patient to ER for further evaluation    DNR (do not resuscitate) discussion Assessment & Plan: DNR status has been requested by patient after risks and benefits of the order were discussed today .  Order given to patient and placed in chart.   Orders: -     Do not attempt resuscitation (DNR)  Other orders -     Divalproex  Sodium; Take 1 tablet (125 mg total) by mouth 2 (two) times daily.  Dispense: 180 tablet; Refill: 3 -     Metoprolol  Succinate ER; Take 1 tablet (100 mg total) by mouth 2 (two) times daily. Take with or immediately following a meal.  Dispense:  180 tablet; Refill: 0 -     Omeprazole ; Take 1 capsule (20 mg total) by mouth daily.  Dispense: 90 capsule; Refill: 3 -     Venlafaxine  HCl; Take 1 tablet (75 mg total) by mouth 2 (two) times daily.  Dispense: 180 tablet; Refill: 3      I discussed the assessment and treatment plan with the patient. The patient was provided an opportunity to ask questions and all were answered. The patient agreed with the plan and demonstrated an understanding of the instructions.   The patient was advised to call back or seek an in-person evaluation if the symptoms worsen or if the condition fails to improve as anticipated.   I spent 30 minutes dedicated to the care of this patient on the date of this encounter to include pre-visit review of patient's medical history,  including recent ER visit, imaging studies and labs, face-to-face time with the patient , and post visit ordering of testing and therapeutics.    Verneita LITTIE Kettering, MD

## 2024-07-08 ENCOUNTER — Other Ambulatory Visit: Payer: Self-pay

## 2024-07-08 ENCOUNTER — Ambulatory Visit: Admitting: Internal Medicine

## 2024-07-08 ENCOUNTER — Emergency Department

## 2024-07-08 ENCOUNTER — Inpatient Hospital Stay
Admission: EM | Admit: 2024-07-08 | Discharge: 2024-07-26 | DRG: 871 | Disposition: E | Attending: Internal Medicine | Admitting: Internal Medicine

## 2024-07-08 ENCOUNTER — Encounter: Payer: Self-pay | Admitting: Internal Medicine

## 2024-07-08 VITALS — BP 130/70 | HR 47 | Ht 63.0 in | Wt 135.0 lb

## 2024-07-08 DIAGNOSIS — E876 Hypokalemia: Secondary | ICD-10-CM | POA: Diagnosis present

## 2024-07-08 DIAGNOSIS — F419 Anxiety disorder, unspecified: Secondary | ICD-10-CM | POA: Diagnosis present

## 2024-07-08 DIAGNOSIS — I739 Peripheral vascular disease, unspecified: Secondary | ICD-10-CM | POA: Diagnosis present

## 2024-07-08 DIAGNOSIS — R4182 Altered mental status, unspecified: Secondary | ICD-10-CM | POA: Diagnosis not present

## 2024-07-08 DIAGNOSIS — Z8249 Family history of ischemic heart disease and other diseases of the circulatory system: Secondary | ICD-10-CM

## 2024-07-08 DIAGNOSIS — A419 Sepsis, unspecified organism: Secondary | ICD-10-CM | POA: Diagnosis not present

## 2024-07-08 DIAGNOSIS — I2510 Atherosclerotic heart disease of native coronary artery without angina pectoris: Secondary | ICD-10-CM | POA: Diagnosis present

## 2024-07-08 DIAGNOSIS — Z66 Do not resuscitate: Secondary | ICD-10-CM | POA: Diagnosis present

## 2024-07-08 DIAGNOSIS — I10 Essential (primary) hypertension: Secondary | ICD-10-CM | POA: Diagnosis present

## 2024-07-08 DIAGNOSIS — I70263 Atherosclerosis of native arteries of extremities with gangrene, bilateral legs: Secondary | ICD-10-CM | POA: Diagnosis present

## 2024-07-08 DIAGNOSIS — I872 Venous insufficiency (chronic) (peripheral): Secondary | ICD-10-CM | POA: Diagnosis present

## 2024-07-08 DIAGNOSIS — R627 Adult failure to thrive: Secondary | ICD-10-CM | POA: Insufficient documentation

## 2024-07-08 DIAGNOSIS — L97519 Non-pressure chronic ulcer of other part of right foot with unspecified severity: Secondary | ICD-10-CM | POA: Diagnosis present

## 2024-07-08 DIAGNOSIS — E039 Hypothyroidism, unspecified: Secondary | ICD-10-CM | POA: Diagnosis present

## 2024-07-08 DIAGNOSIS — I110 Hypertensive heart disease with heart failure: Secondary | ICD-10-CM | POA: Diagnosis present

## 2024-07-08 DIAGNOSIS — Z820 Family history of epilepsy and other diseases of the nervous system: Secondary | ICD-10-CM

## 2024-07-08 DIAGNOSIS — R6521 Severe sepsis with septic shock: Secondary | ICD-10-CM | POA: Diagnosis present

## 2024-07-08 DIAGNOSIS — Z818 Family history of other mental and behavioral disorders: Secondary | ICD-10-CM

## 2024-07-08 DIAGNOSIS — Z823 Family history of stroke: Secondary | ICD-10-CM

## 2024-07-08 DIAGNOSIS — I4819 Other persistent atrial fibrillation: Secondary | ICD-10-CM | POA: Diagnosis present

## 2024-07-08 DIAGNOSIS — I4891 Unspecified atrial fibrillation: Secondary | ICD-10-CM | POA: Diagnosis present

## 2024-07-08 DIAGNOSIS — T50905A Adverse effect of unspecified drugs, medicaments and biological substances, initial encounter: Secondary | ICD-10-CM | POA: Diagnosis not present

## 2024-07-08 DIAGNOSIS — E785 Hyperlipidemia, unspecified: Secondary | ICD-10-CM | POA: Diagnosis present

## 2024-07-08 DIAGNOSIS — Z5941 Food insecurity: Secondary | ICD-10-CM

## 2024-07-08 DIAGNOSIS — Z7901 Long term (current) use of anticoagulants: Secondary | ICD-10-CM

## 2024-07-08 DIAGNOSIS — J9621 Acute and chronic respiratory failure with hypoxia: Secondary | ICD-10-CM | POA: Diagnosis not present

## 2024-07-08 DIAGNOSIS — L97529 Non-pressure chronic ulcer of other part of left foot with unspecified severity: Secondary | ICD-10-CM | POA: Diagnosis present

## 2024-07-08 DIAGNOSIS — R410 Disorientation, unspecified: Secondary | ICD-10-CM | POA: Diagnosis present

## 2024-07-08 DIAGNOSIS — I4510 Unspecified right bundle-branch block: Secondary | ICD-10-CM | POA: Diagnosis present

## 2024-07-08 DIAGNOSIS — Z809 Family history of malignant neoplasm, unspecified: Secondary | ICD-10-CM

## 2024-07-08 DIAGNOSIS — I5032 Chronic diastolic (congestive) heart failure: Secondary | ICD-10-CM | POA: Diagnosis present

## 2024-07-08 DIAGNOSIS — Z515 Encounter for palliative care: Secondary | ICD-10-CM

## 2024-07-08 DIAGNOSIS — E0590 Thyrotoxicosis, unspecified without thyrotoxic crisis or storm: Secondary | ICD-10-CM | POA: Diagnosis present

## 2024-07-08 DIAGNOSIS — E860 Dehydration: Secondary | ICD-10-CM | POA: Diagnosis present

## 2024-07-08 DIAGNOSIS — E872 Acidosis, unspecified: Secondary | ICD-10-CM | POA: Diagnosis present

## 2024-07-08 DIAGNOSIS — Z881 Allergy status to other antibiotic agents status: Secondary | ICD-10-CM

## 2024-07-08 DIAGNOSIS — G3184 Mild cognitive impairment, so stated: Secondary | ICD-10-CM | POA: Diagnosis present

## 2024-07-08 DIAGNOSIS — R54 Age-related physical debility: Secondary | ICD-10-CM | POA: Diagnosis present

## 2024-07-08 DIAGNOSIS — L03115 Cellulitis of right lower limb: Secondary | ICD-10-CM | POA: Diagnosis present

## 2024-07-08 DIAGNOSIS — D329 Benign neoplasm of meninges, unspecified: Principal | ICD-10-CM

## 2024-07-08 DIAGNOSIS — Z885 Allergy status to narcotic agent status: Secondary | ICD-10-CM

## 2024-07-08 DIAGNOSIS — Z9981 Dependence on supplemental oxygen: Secondary | ICD-10-CM

## 2024-07-08 DIAGNOSIS — Z833 Family history of diabetes mellitus: Secondary | ICD-10-CM

## 2024-07-08 DIAGNOSIS — Z79899 Other long term (current) drug therapy: Secondary | ICD-10-CM

## 2024-07-08 DIAGNOSIS — I70223 Atherosclerosis of native arteries of extremities with rest pain, bilateral legs: Secondary | ICD-10-CM | POA: Diagnosis present

## 2024-07-08 DIAGNOSIS — R6520 Severe sepsis without septic shock: Secondary | ICD-10-CM | POA: Diagnosis present

## 2024-07-08 DIAGNOSIS — H409 Unspecified glaucoma: Secondary | ICD-10-CM | POA: Diagnosis present

## 2024-07-08 DIAGNOSIS — I70203 Unspecified atherosclerosis of native arteries of extremities, bilateral legs: Secondary | ICD-10-CM | POA: Diagnosis present

## 2024-07-08 LAB — CBC
HCT: 37.1 % (ref 36.0–46.0)
Hemoglobin: 11.5 g/dL — ABNORMAL LOW (ref 12.0–15.0)
MCH: 32 pg (ref 26.0–34.0)
MCHC: 31 g/dL (ref 30.0–36.0)
MCV: 103.3 fL — ABNORMAL HIGH (ref 80.0–100.0)
Platelets: 232 K/uL (ref 150–400)
RBC: 3.59 MIL/uL — ABNORMAL LOW (ref 3.87–5.11)
RDW: 15.1 % (ref 11.5–15.5)
WBC: 10.7 K/uL — ABNORMAL HIGH (ref 4.0–10.5)
nRBC: 0 % (ref 0.0–0.2)

## 2024-07-08 LAB — HEPATIC FUNCTION PANEL
ALT: 11 U/L (ref 0–44)
AST: 19 U/L (ref 15–41)
Albumin: 3 g/dL — ABNORMAL LOW (ref 3.5–5.0)
Alkaline Phosphatase: 67 U/L (ref 38–126)
Bilirubin, Direct: 0.3 mg/dL — ABNORMAL HIGH (ref 0.0–0.2)
Indirect Bilirubin: 0.5 mg/dL (ref 0.3–0.9)
Total Bilirubin: 0.8 mg/dL (ref 0.0–1.2)
Total Protein: 7 g/dL (ref 6.5–8.1)

## 2024-07-08 LAB — BASIC METABOLIC PANEL WITH GFR
Anion gap: 15 (ref 5–15)
BUN: 19 mg/dL (ref 8–23)
CO2: 39 mmol/L — ABNORMAL HIGH (ref 22–32)
Calcium: 9.3 mg/dL (ref 8.9–10.3)
Chloride: 87 mmol/L — ABNORMAL LOW (ref 98–111)
Creatinine, Ser: 0.38 mg/dL — ABNORMAL LOW (ref 0.44–1.00)
GFR, Estimated: >60 mL/min (ref >60)
Glucose, Bld: 167 mg/dL — ABNORMAL HIGH (ref 70–99)
Potassium: 3.6 mmol/L (ref 3.5–5.1)
Sodium: 141 mmol/L (ref 135–145)

## 2024-07-08 LAB — LACTIC ACID, PLASMA
Lactic Acid, Venous: 2.9 mmol/L (ref 0.5–1.9)
Lactic Acid, Venous: 2.8 mmol/L (ref 0.5–1.9)

## 2024-07-08 LAB — TROPONIN I (HIGH SENSITIVITY): Troponin I (High Sensitivity): 14 ng/L (ref <18)

## 2024-07-08 LAB — TSH: TSH: 3.461 u[IU]/mL (ref 0.350–4.500)

## 2024-07-08 LAB — BLOOD GAS, VENOUS: Patient temperature: 37

## 2024-07-08 LAB — OSMOLALITY: Osmolality: 299 mosm/kg — ABNORMAL HIGH (ref 275–295)

## 2024-07-08 LAB — VALPROIC ACID LEVEL: Valproic Acid Lvl: 32 ug/mL — ABNORMAL LOW (ref 50–100)

## 2024-07-08 LAB — BRAIN NATRIURETIC PEPTIDE: B Natriuretic Peptide: 479.3 pg/mL — ABNORMAL HIGH (ref 0.0–100.0)

## 2024-07-08 LAB — DIGOXIN LEVEL: Digoxin Level: <0.6 ng/mL — ABNORMAL LOW (ref 0.8–2.0)

## 2024-07-08 MED ORDER — METOPROLOL TARTRATE 5 MG/5ML IV SOLN
5.0000 mg | INTRAVENOUS | Status: DC | PRN
  Administered 2024-07-08 – 2024-07-09 (×4): 5 mg via INTRAVENOUS
  Filled 2024-07-08 (×2): qty 5

## 2024-07-08 NOTE — Progress Notes (Addendum)
 Subjective:  Patient ID: Alexandria Sherman, female    DOB: 1945/12/25  Age: 78 y.o. MRN: [id number]  CC: The primary encounter diagnosis was Delirium. A diagnosis of Failure to thrive syndrome, adult was also pertinent to this visit.   HPI [name] presents for  Chief Complaint  Patient presents with   Medical Management of Chronic Issues   ACCOMPANIED BY CANDY.   78 yr old female with   Mild cognitive impairment, chronic lower extremity lymphedema and weakness resulting in Nonambulatory state since her hip fracture years ago , subclinical hyperthyroidism , with recent admission to  [facility] on August 16  with sepsis secondary to  pyuria and new onset atrial fib with RVR presents for follow up on persistent AMS with visual hallucinations that started  after starting metoprolol  for control of pulse   Last seen virtually on Oct 9 for 3 day history of same.  Advised to go to ER for urgent evaluation to rule out  ARF, HYPOXIC respiratory failure, digoxin  toxicity.  She did not go to the ER.  She presents today accompanied by [facility] who is concerned about her mental status changes and the change in condition of  her feet, HAS  RUN OUT OF PORTABLE OXYGEN.  BUT WEARS OXYGEN 24/7 AT HOME.  FOR THE LAST 2-3 WEEKS SHE HAS BEEN AGITATED, CONFUSED, .waking up paranoid,  complaining of foot pain.     She has developed pressure ulcers ON BOTH HEELS   that have apparently been present since Sept 22,  because she is nonambulatory and her aides have been resting her heels on the bed and on an ottoman      Outpatient Medications Prior to Visit  Medication Sig Dispense Refill   apixaban  (ELIQUIS ) 5 MG TABS tablet Take 1 tablet (5 mg total) by mouth 2 (two) times daily. 60 tablet 3   clorazepate  (TRANXENE ) 3.75 MG tablet TAKE 1 TABLET BY MOUTH 3 TIMES DAILY AS NEEDED FOR ANXIETY. 90 tablet 0   clotrimazole -betamethasone  (LOTRISONE ) cream Apply 1 Application topically daily. 30 g 0   diphenhydrAMINE   (BENADRYL ) 25 MG tablet Take 25 mg by mouth every 6 (six) hours as needed.     divalproex  (DEPAKOTE ) 125 MG DR tablet Take 1 tablet (125 mg total) by mouth 2 (two) times daily. 180 tablet 3   furosemide  (LASIX ) 20 MG tablet Take 1 tablet (20 mg total) by mouth daily. 30 tablet 11   ibuprofen (ADVIL,MOTRIN) 200 MG tablet Take 200 mg by mouth every 6 (six) hours as needed.     latanoprost  (XALATAN ) 0.005 % ophthalmic solution Place 1 drop into both eyes at bedtime.     meclizine  (ANTIVERT ) 25 MG tablet TAKE ONE TABLET DAILY AS NEEDED 90 tablet 1   methimazole  (TAPAZOLE ) 5 MG tablet Take 0.5 tablets (2.5 mg total) by mouth daily. 30 tablet 0   metoprolol  succinate (TOPROL -XL) 100 MG 24 hr tablet Take 1 tablet (100 mg total) by mouth 2 (two) times daily. Take with or immediately following a meal. 180 tablet 0   mometasone  (ELOCON ) 0.1 % cream Apply 1 Application topically 2 (two) times daily as needed (itching). 45 g 1   nystatin  (MYCOSTATIN /NYSTOP ) powder Apply 1 Application topically 3 (three) times daily.     omeprazole  (PRILOSEC ) 20 MG capsule Take 1 capsule (20 mg total) by mouth daily. 90 capsule 3   polyethylene glycol (MIRALAX  / GLYCOLAX ) 17 g packet Take 17 g by mouth daily as needed for mild  constipation. 14 each 0   QUEtiapine  (SEROQUEL ) 25 MG tablet Take 0.5 tablets (12.5 mg total) by mouth daily after supper. 30 tablet 0   timolol  (TIMOPTIC ) 0.5 % ophthalmic solution Place 1 drop into both eyes daily.     venlafaxine  (EFFEXOR ) 75 MG tablet Take 1 tablet (75 mg total) by mouth 2 (two) times daily. 180 tablet 3   No facility-administered medications prior to visit.    Review of Systems;  Patient denies headache, fevers, malaise, unintentional weight loss, skin rash, eye pain, sinus congestion and sinus pain, sore throat, dysphagia,  hemoptysis , cough, dyspnea, wheezing, chest pain, palpitations, orthopnea, edema, abdominal pain, nausea, melena, diarrhea, constipation, flank pain,  dysuria, hematuria, urinary  Frequency, nocturia, numbness, tingling, seizures,  Focal weakness, Loss of consciousness,  Tremor, insomnia, depression, anxiety, and suicidal ideation.      Objective:  BP 130/70   Pulse (!) 47   Ht 5' 3 (1.6 m)   Wt 135 lb (61.2 kg)   SpO2 96%   BMI 23.91 kg/m   BP Readings from Last 3 Encounters:  07/08/24 130/70  06/25/24 95/80  05/28/24 122/60    Wt Readings from Last 3 Encounters:  07/08/24 135 lb (61.2 kg)  07/03/24 135 lb (61.2 kg)  06/25/24 135 lb (61.2 kg)    Physical Exam Vitals reviewed.  Constitutional:      General: She is not in acute distress.    Appearance: Normal appearance. She is normal weight. She is not ill-appearing, toxic-appearing or diaphoretic.  HENT:     Head: Normocephalic.  Eyes:     General: No scleral icterus.       Right eye: No discharge.        Left eye: No discharge.     Conjunctiva/sclera: Conjunctivae normal.  Cardiovascular:     Rate and Rhythm: Tachycardia present. Rhythm irregularly irregular.     Pulses: No decreased pulses.          Popliteal pulses are 0 on the right side and 0 on the left side.       Dorsalis pedis pulses are 0 on the right side and 0 on the left side.  Pulmonary:     Effort: Pulmonary effort is normal. No respiratory distress.     Breath sounds: Normal breath sounds.  Musculoskeletal:        General: Normal range of motion.     Right lower leg: 1+ Pitting Edema present.     Left lower leg: 1+ Pitting Edema present.  Skin:    General: Skin is warm and dry.     Findings: Ecchymosis present.     Comments: Bilateral heel ulcers with ecchymosis,  feet are purple and cold   Neurological:     General: No focal deficit present.     Mental Status: She is alert and oriented to person, place, and time. Mental status is at baseline.  Psychiatric:        Mood and Affect: Mood normal.        Behavior: Behavior normal.        Thought Content: Thought content normal.         Judgment: Judgment normal.     Lab Results  Component Value Date   HGBA1C 5.7 (H) 05/12/2024   HGBA1C 5.7 11/22/2020   HGBA1C 5.9 10/22/2017    Lab Results  Component Value Date   CREATININE 0.35 (L) 05/21/2024   CREATININE 0.35 (L) 05/20/2024   CREATININE 0.37 (L) 05/19/2024  Lab Results  Component Value Date   WBC 11.4 (H) 05/18/2024   HGB 12.5 05/18/2024   HCT 40.6 05/18/2024   PLT 239 05/18/2024   GLUCOSE 79 05/21/2024   CHOL 112 05/12/2024   TRIG 78 05/12/2024   HDL 31 (L) 05/12/2024   LDLDIRECT 140.8 06/15/2009   LDLCALC 65 05/12/2024   ALT 11 05/20/2024   AST 12 (L) 05/20/2024   NA 138 05/21/2024   K 4.3 05/21/2024   CL 86 (L) 05/21/2024   CREATININE 0.35 (L) 05/21/2024   BUN 17 05/21/2024   CO2 39 (H) 05/21/2024   TSH 0.027 (L) 05/10/2024   INR 1.1 05/10/2024   HGBA1C 5.7 (H) 05/12/2024    DG Chest Port 1 View Result Date: 05/12/2024 CLINICAL DATA:  Shortness of breath. EXAM: PORTABLE CHEST 1 VIEW COMPARISON:  Chest CT 05/10/2024 FINDINGS: Again seen elevation of right hemidiaphragm. Stable heart size and mediastinal contours. Development of small bilateral pleural effusions with bibasilar opacities. Additional areas of bandlike atelectasis in the left mid lung. No pulmonary edema. No pneumothorax. IMPRESSION: 1. Development of small bilateral pleural effusions with bibasilar opacities. 2. Additional areas of bandlike atelectasis in the left mid lung. Electronically Signed   By: Andrea Gasman M.D.   On: 05/12/2024 12:50   US  THYROID  Result Date: 05/12/2024 CLINICAL DATA:  Hyperthyroid. EXAM: THYROID  ULTRASOUND TECHNIQUE: Ultrasound examination of the thyroid  gland and adjacent soft tissues was performed. COMPARISON:  None Available. FINDINGS: Parenchymal Echotexture: Mildly heterogenous Isthmus: 0.5 cm Right lobe: 4.9 x 2.6 x 2.2 cm Left lobe: 4.2 x 2.6 x 2.2 cm _________________________________________________________ Estimated total number of nodules >/=  1 cm: 6-10 Number of spongiform nodules >/=  2 cm not described below (TR1): 0 Number of mixed cystic and solid nodules >/= 1.5 cm not described below (TR2): 0 _________________________________________________________ Nodule # 1: Location: Right; Mid Maximum size: 1.3 cm; Other 2 dimensions: 0.7 x 0.7 cm Composition: solid/almost completely solid (2) Echogenicity: hypoechoic (2) Shape: not taller-than-wide (0) Margins: smooth (0) Echogenic foci: none (0) ACR TI-RADS total points: 4. ACR TI-RADS risk category: TR4 (4-6 points). ACR TI-RADS recommendations: *Given size (>/= 1 - 1.4 cm) and appearance, a follow-up ultrasound in 1 year should be considered based on TI-RADS criteria. _________________________________________________________ Nodule # 2: Location: Right; Inferior Maximum size: 1.2 cm; Other 2 dimensions: 1.2 x 1.0 cm Composition: solid/almost completely solid (2) Echogenicity: hypoechoic (2) Shape: not taller-than-wide (0) Margins: smooth (0) Echogenic foci: macrocalcifications (1) ACR TI-RADS total points: 5. ACR TI-RADS risk category: TR4 (4-6 points). ACR TI-RADS recommendations: *Given size (>/= 1 - 1.4 cm) and appearance, a follow-up ultrasound in 1 year should be considered based on TI-RADS criteria. _________________________________________________________ Nodule # 6: Location: Left; Inferior Maximum size: 1.4 cm; Other 2 dimensions: 1.2 x 1.1 cm Composition: solid/almost completely solid (2) Echogenicity: isoechoic (1) Shape: not taller-than-wide (0) Margins: ill-defined (0) Echogenic foci: none (0) ACR TI-RADS total points: 3. ACR TI-RADS risk category: TR3 (3 points). ACR TI-RADS recommendations: Given size (</=1.4 cm) and appearance, this nodule does NOT meet TI-RADS criteria for biopsy or dedicated follow-up. _________________________________________________________ Other innumerable nodules scattered throughout the thyroid  gland are cystic, partially cystic or spongiform and do not meet  criteria for biopsy or dedicated follow-up. No enlarged or abnormal appearing lymph nodes are identified. IMPRESSION: Multiple bilateral thyroid  nodules. Nodules 1 and 2 described above in the right lobe meet criteria for 1 year follow-up ultrasound. Other nodules do not meet criteria for biopsy or dedicated follow-up. Electronically  Signed   By: Marcey Moan M.D.   On: 05/12/2024 08:20   ECHOCARDIOGRAM COMPLETE Result Date: 05/11/2024    ECHOCARDIOGRAM REPORT   Patient Name:   AMBERLYNN TEMPESTA Date of Exam: 05/11/2024 Medical Rec #:  [id number]       Height:       64.0 in Accession #:    [id number]      Weight:       136.5 lb Date of Birth:  1946/03/04       BSA:          1.663 m Patient Age:    38 years        BP:           96/83 mmHg Patient Gender: F               HR:           94 bpm. Exam Location:  [facility] Procedure: 2D Echo, Cardiac Doppler and Color Doppler (Both Spectral and Color            Flow Doppler were utilized during procedure). Indications:     Atrial Fibrillation I48.91  History:         Patient has prior history of Echocardiogram examinations.  Sonographer:     Bernice Rubinstein RDCS Referring Phys:  [id number] [facility] PAUDEL Diagnosing Phys: Redell Cave MD  Sonographer Comments: Image acquisition challenging due to respiratory motion. IMPRESSIONS  1. Left ventricular ejection fraction, by estimation, is 50 to 55%. The left ventricle has low normal function. The left ventricle has no regional wall motion abnormalities. Left ventricular diastolic parameters are indeterminate.  2. Right ventricular systolic function is normal. The right ventricular size is normal.  3. The mitral valve is normal in structure. Mild mitral valve regurgitation.  4. The aortic valve is tricuspid. Aortic valve regurgitation is not visualized.  5. The inferior vena cava is normal in size with greater than 50% respiratory variability, suggesting right atrial pressure of 3 mmHg. FINDINGS  Left Ventricle: Left ventricular  ejection fraction, by estimation, is 50 to 55%. The left ventricle has low normal function. The left ventricle has no regional wall motion abnormalities. The left ventricular internal cavity size was normal in size. There is no left ventricular hypertrophy. Left ventricular diastolic parameters are indeterminate. Right Ventricle: The right ventricular size is normal. No increase in right ventricular wall thickness. Right ventricular systolic function is normal. Left Atrium: Left atrial size was normal in size. Right Atrium: Right atrial size was normal in size. Pericardium: There is no evidence of pericardial effusion. Mitral Valve: The mitral valve is normal in structure. Mild mitral valve regurgitation. Tricuspid Valve: The tricuspid valve is normal in structure. Tricuspid valve regurgitation is mild. Aortic Valve: The aortic valve is tricuspid. Aortic valve regurgitation is not visualized. Aortic valve peak gradient measures 8.8 mmHg. Pulmonic Valve: The pulmonic valve was normal in structure. Pulmonic valve regurgitation is not visualized. Aorta: The aortic root and ascending aorta are structurally normal, with no evidence of dilitation. Venous: The inferior vena cava is normal in size with greater than 50% respiratory variability, suggesting right atrial pressure of 3 mmHg. IAS/Shunts: No atrial level shunt detected by color flow Doppler.  LEFT VENTRICLE PLAX 2D LVIDd:         4.00 cm   Diastology LVIDs:         3.10 cm   LV e' medial:  5.11 cm/s LV PW:  1.10 cm   LV e' lateral: 15.20 cm/s LV IVS:        1.00 cm LVOT diam:     2.00 cm LV SV:         23 LV SV Index:   14 LVOT Area:     3.14 cm  RIGHT VENTRICLE RV Basal diam:  3.00 cm TAPSE (M-mode): 1.8 cm LEFT ATRIUM             Index        RIGHT ATRIUM           Index LA diam:        3.90 cm 2.35 cm/m   RA Area:     14.00 cm LA Vol (A2C):   61.2 ml 36.80 ml/m  RA Volume:   33.90 ml  20.38 ml/m LA Vol (A4C):   52.4 ml 31.51 ml/m LA Biplane Vol:  58.7 ml 35.30 ml/m  AORTIC VALVE                 PULMONIC VALVE AV Area (Vmax): 1.51 cm     PV Vmax:        0.79 m/s AV Vmax:        148.00 cm/s  PV Peak grad:   2.5 mmHg AV Peak Grad:   8.8 mmHg     RVOT Peak grad: 1 mmHg LVOT Vmax:      71.00 cm/s LVOT Vmean:     44.900 cm/s LVOT VTI:       0.072 m  AORTA Ao Root diam: 3.30 cm Ao Asc diam:  3.00 cm TRICUSPID VALVE TR Peak grad:   33.2 mmHg TR Vmax:        288.00 cm/s  SHUNTS Systemic VTI:  0.07 m Systemic Diam: 2.00 cm Redell Cave MD Electronically signed by Redell Cave MD Signature Date/Time: 05/11/2024/10:51:51 AM    Final     Assessment & Plan:  .Delirium Assessment & Plan: Ddx includes sepsis from infected pressure ulcers of heels,  digoxin  toxicity, acute renal failure, and nocturnal hypoxemia.  Sending to ER for evaluation    Failure to thrive syndrome, adult Assessment & Plan: Patient refuses to consider placement despite being nonambulatory.  She currently has plans to increase the numbner of aides to provide assistance but her health is clearly failing and she is now developing pressure ulcers, I have strongly advised her to reconsider placement and at this point I believe she lacks the capacity to make intelligent decisions .      Follow-up: No follow-ups on file.   Verneita LITTIE Kettering, MD

## 2024-07-08 NOTE — Hospital Course (Addendum)
 History and Physical    Alexandria Sherman FMW:[id number] DOB: October 21, 1945 DOA: 07/08/2024  DOS: the patient was seen and examined on 07/08/2024  PCP: Alexandria Verneita CROME, MD   Patient coming from: Home  I have personally briefly reviewed patient's old medical records in [facility] Link and CareEverywhere  HPI:   Alexandria Sherman is a 78 y.o. year old female with past medical history of HTN, HLD, paroxsymal atrial fibrillation, hyperthyroidism, venous stasis, and mild cognitive impairment presenting to the ED from the clinic due to concern for altered mental status and hallucination. Appears PCP advised pt to seek care on 10/09 but pt refused along with ischemic changes noted on her feet.  Pt states she is feeling well and currently doesn't have any complaints. When talking about her feet, she states she doesn't know how long they have been like this but it is not painful and her feet stay cold all the time.   Pt states she sees visual hallucinations and they have been present for over a week. She denies any hx of psychiatric disorder.     Spoke with pt's sister. Pt noted to be declining since last hospital discharge. She has been having hallucination since last discharge and per the sister when she visited in September her feet were in very poor condition and very cold at that time.  States she thinks this is due to metoprolol  side effect.    Prior to hospitalization  ED Course: On arrival to the ED patient was noted to be HDS stable. CTH showed 1.2 cm meningioma. Multiple lab work including troponin was ordered and hospitalist was consulted for admission. Pt noted to be in afib with RVR and this was present on EKG.   Review of Systems: As mentioned in the history of present illness. All other systems reviewed and are negative.   Past Medical History:  Diagnosis Date   Acute lateral meniscus tear of right knee    Anxiety disorder, unspecified    Atrial fibrillation with rapid ventricular  response (HCC) 05/11/2024   Cataract [facility]   Clotting disorder blood circulation/veins in feet and lower legs   Depression 1978   Diverticulosis of colon 04/2005   GERD (gastroesophageal reflux disease)    Glaucoma [facility]   Hypertension    Hyperthyroidism    Dr Damian   Impaired fasting glucose    Low TSH level    chronic low, but euthyroid   Microscopic hematuria    Multiple thyroid  nodules    Persistent atrial fibrillation (HCC)    RBBB    S/p left hip fracture    Sepsis (HCC) 05/14/2024   Sleep disturbance    Subclinical hyperthyroidism    unspecified   Tibia fracture 05/2010   left    Past Surgical History:  Procedure Laterality Date   ABDOMINAL HYSTERECTOMY  1988   hysterectomy/ BSO for endometriosis   BREAST BIOPSY Right 1992   neg   BREAST EXCISIONAL BIOPSY Right 1988   benign per pt   INTRAMEDULLARY (IM) NAIL INTERTROCHANTERIC Left 01/15/2022   Procedure: INTRAMEDULLARY (IM) NAIL INTERTROCHANTRIC;  Surgeon: Cleotilde Barrio, MD;  Location: [facility] ORS;  Service: Orthopedics;  Laterality: Left;   KNEE ARTHROSCOPY  6/12   left--Dr Ernie     Allergies  Allergen Reactions   Doxycycline  Other (See Comments)    Hallucinations, nocturnal:  In the night felt like she was flipping in the bed.   Augmentin [Amoxicillin-Pot Clavulanate] Other (See Comments)  Pt cannot remember   Cephalexin     REACTION: vaginal blisters Tolerates penicillins   Codeine Phosphate     REACTION: vomiting   Promethazine Hcl     REACTION: nerves    Family History  Problem Relation Age of Onset   Cancer Mother        lung   Diabetes Mother    Depression Mother    Depression Sister    Parkinson's disease Sister    Cancer Sister    Depression Sister    Stroke Other    Heart attack Other    Cancer Other    Dementia Other    Breast cancer Neg Hx    Thyroid  disease Neg Hx     Prior to Admission medications   Medication Sig Start Date End Date Taking?  Authorizing Provider  apixaban  (ELIQUIS ) 5 MG TABS tablet Take 1 tablet (5 mg total) by mouth 2 (two) times daily. 05/21/24   Alexandria Drue DASEN, MD  clorazepate  (TRANXENE ) 3.75 MG tablet TAKE 1 TABLET BY MOUTH 3 TIMES DAILY AS NEEDED FOR ANXIETY. 01/24/24   Jimmy Charlie FERNS, MD  clotrimazole -betamethasone  (LOTRISONE ) cream Apply 1 Application topically daily. 06/02/24   Webb, Padonda B, FNP  diphenhydrAMINE  (BENADRYL ) 25 MG tablet Take 25 mg by mouth every 6 (six) hours as needed.    [provider]  divalproex  (DEPAKOTE ) 125 MG DR tablet Take 1 tablet (125 mg total) by mouth 2 (two) times daily. 07/03/24   Alexandria Verneita CROME, MD  furosemide  (LASIX ) 20 MG tablet Take 1 tablet (20 mg total) by mouth daily. 06/25/24   Vivienne Lonni Ingle, NP  ibuprofen (ADVIL,MOTRIN) 200 MG tablet Take 200 mg by mouth every 6 (six) hours as needed.    [provider]  latanoprost  (XALATAN ) 0.005 % ophthalmic solution Place 1 drop into both eyes at bedtime. 01/15/12   [provider]  meclizine  (ANTIVERT ) 25 MG tablet TAKE ONE TABLET DAILY AS NEEDED 12/12/22   Jimmy Charlie FERNS, MD  methimazole  (TAPAZOLE ) 5 MG tablet Take 0.5 tablets (2.5 mg total) by mouth daily. 05/22/24   Alexandria Drue DASEN, MD  metoprolol  succinate (TOPROL -XL) 100 MG 24 hr tablet Take 1 tablet (100 mg total) by mouth 2 (two) times daily. Take with or immediately following a meal. 07/03/24   Alexandria Verneita CROME, MD  mometasone  (ELOCON ) 0.1 % cream Apply 1 Application topically 2 (two) times daily as needed (itching). 12/12/22   Jimmy Charlie FERNS, MD  nystatin  (MYCOSTATIN /NYSTOP ) powder Apply 1 Application topically 3 (three) times daily. 04/04/24   [provider]  omeprazole  (PRILOSEC ) 20 MG capsule Take 1 capsule (20 mg total) by mouth daily. 07/03/24   Tullo, Teresa L, MD  polyethylene glycol (MIRALAX  / GLYCOLAX ) 17 g packet Take 17 g by mouth daily as needed for mild constipation. 01/18/22   Alexander, Natalie, DO  QUEtiapine   (SEROQUEL ) 25 MG tablet Take 0.5 tablets (12.5 mg total) by mouth daily after supper. 05/21/24   Alexandria Drue DASEN, MD  timolol  (TIMOPTIC ) 0.5 % ophthalmic solution Place 1 drop into both eyes daily.    [provider]  venlafaxine  (EFFEXOR ) 75 MG tablet Take 1 tablet (75 mg total) by mouth 2 (two) times daily. 07/03/24   Alexandria Verneita CROME, MD  Calcium  Carbonate (CALCIUM  500 PO) Take one by mouth twice a day   07/17/19  [provider]      reports that she has never smoked. She has been exposed to tobacco smoke. She  has never used smokeless tobacco. She reports current alcohol use. No history on file for drug use. Lives by herself Tobacco- Denies use. EtOH- Denies use.  Illicit drug use- denies use.  IADLs/ADLs- dependant in all ADLs, iADL.    Physical Exam: Vitals:   07/08/24 1725 07/08/24 1726 07/08/24 2130 07/08/24 2232  BP:  (!) 140/122 (!) 108/91   Pulse:  88 (!) 113   Resp:  19 (!) 24 (!) 22  Temp:  98.8 F (37.1 C)  97.9 F (36.6 C)  TempSrc:  Oral  Axillary  SpO2:  91% 94%   Weight: 59 kg     Height: 5' 3 (1.6 m)      Gen:NAD HENT: [place] in place RC:pmmzhlojmob irregular rate, good radial pulses, absent pedal pulses on palpation, cold feet Resp: CTAB Abd: No TTP, normal bowel sounds MSK: no asymmetry Skin: lower extremity with cold feet and bluish skin Neuro: alert and oriented x4. Able to answer all questions appropriately but goes on tangents.  Psych: anxious mood   Labs on Admission: I have personally reviewed following labs and imaging studies  CBC: Recent Labs  Lab 07/08/24 1733  WBC 10.7*  HGB 11.5*  HCT 37.1  MCV 103.3*  PLT 232   Basic Metabolic Panel: Recent Labs  Lab 07/08/24 1733  NA 141  K 3.6  CL 87*  CO2 39*  GLUCOSE 167*  BUN 19  CREATININE 0.38*  CALCIUM  9.3   GFR: Estimated Creatinine Clearance: 47.2 mL/min (A) (by C-G formula based on SCr of 0.38 mg/dL (L)). Liver Function Tests: Recent Labs  Lab 07/08/24 2100   AST 19  ALT 11  ALKPHOS 67  BILITOT 0.8  PROT 7.0  ALBUMIN 3.0*   No results for input(s): LIPASE, AMYLASE in the last 168 hours. No results for input(s): AMMONIA in the last 168 hours. Coagulation Profile: No results for input(s): INR, PROTIME in the last 168 hours. Cardiac Enzymes: Recent Labs  Lab 07/08/24 2100  TROPONINIHS 14   BNP (last 3 results) Recent Labs    05/15/24 0357 05/17/24 0628 07/08/24 1733  BNP 611.0* 227.3* 479.3*   HbA1C: No results for input(s): HGBA1C in the last 72 hours. CBG: No results for input(s): GLUCAP in the last 168 hours. Lipid Profile: No results for input(s): CHOL, HDL, LDLCALC, TRIG, CHOLHDL, LDLDIRECT in the last 72 hours. Thyroid  Function Tests: Recent Labs    07/08/24 2100  TSH 3.461   Anemia Panel: No results for input(s): VITAMINB12, FOLATE, FERRITIN, TIBC, IRON, RETICCTPCT in the last 72 hours. Urine analysis:    Component Value Date/Time   COLORURINE YELLOW (A) 05/10/2024 1247   APPEARANCEUR Turbid (A) 07/01/2024 0947   LABSPEC 1.030 05/10/2024 1247   PHURINE 6.0 05/10/2024 1247   GLUCOSEU Negative 07/01/2024 0947   GLUCOSEU Negative 10/18/2015 1236   HGBUR MODERATE (A) 05/10/2024 1247   BILIRUBINUR Comment (A) 07/01/2024 0947   KETONESUR NEGATIVE 05/10/2024 1247   PROTEINUR 2+ (A) 07/01/2024 0947   PROTEINUR 100 (A) 05/10/2024 1247   UROBILINOGEN 0.2 mg/dL (A) 98/76/7982 8763   NITRITE Positive (A) 07/01/2024 0947   NITRITE NEGATIVE 05/10/2024 1247   LEUKOCYTESUR 3+ (A) 07/01/2024 0947   LEUKOCYTESUR LARGE (A) 05/10/2024 1247    Radiological Exams on Admission: I have personally reviewed images CT Head Wo Contrast Result Date: 07/08/2024 EXAM: CT HEAD WITHOUT CONTRAST 07/08/2024 06:03:38 PM TECHNIQUE: CT of the head was performed without the administration of intravenous contrast. Automated exposure control, iterative reconstruction, and/or weight  based adjustment of the  mA/kV was utilized to reduce the radiation dose to as low as reasonably achievable. COMPARISON: Head CT 01/14/2022 and MRI 04/17/2013. CLINICAL HISTORY: Altered mental status. FINDINGS: BRAIN AND VENTRICLES: Within limitations of moderate motion artifact, no acute large territory infarct, intrarenal hemorrhage, midline shift, extra axial fluid collection, or hydrocephalus is identified. There is mild cerebral atrophy. Cerebral white matter hypodensities are grossly similar to the prior CT and are nonspecific but compatible with moderate chronic small vessel ischemic disease. An approximately 1.2 cm calcified extra axial mass over the right temporoparietal convexity is similar to the prior CT without evidence of associated brain edema. There is associated mild skull hyperostosis. ORBITS: No acute abnormality. SINUSES: No acute abnormality. SOFT TISSUES AND SKULL: No acute soft tissue abnormality. No skull fracture. IMPRESSION: 1. No evidence of an acute intracranial abnormality on this motion-degraded study. 2. 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 Electronically signed by: Dasie Hamburg MD 07/08/2024 06:22 PM EDT RP Workstation: [id number]    EKG: My personal interpretation of EKG shows: It showed atrial fibrillation with heart rate at 124.     Assessment/Plan Active Problems:   * No active hospital problems. *   Lower extremity ischemia: Appears to have ischemia of bilateral lower extremities that appears to be subacute to chronic in nature.  Patient is not having any pain but does have sensation which is reassuring.  No palpable pulses in feet or cold to touch and bluish hue but patient states they have been like this but does not know how long.  Lactic acid was elevated at 2.9 and repeat was 2.8 which could be due to some ischemic changes but could have multiple other etiologies including her A-fib with RVR or dehydration.  A-fib with RVR: Patient noted to be in RVR on my evaluation with heart rates  130s and 140s.  EKG with heart rate of 124 noted earlier.    VTE prophylaxis:  SQ Heparin   Diet: Code Status:  DNR/DNI(Do NOT Intubate) Telemetry:  Admission status: Inpatient, Progressive Patient is from: Home  Anticipated d/c is to: TBD  Anticipated d/c is in: TBD    Family Communication: Sister updated over the phone, sister requests daily updates  Consults called: Vascular surgery by EDP, Neurology    Severity of Illness: The appropriate patient status for this patient is INPATIENT. Inpatient status is judged to be reasonable and necessary in order to provide the required intensity of service to ensure the patient's safety. The patient's presenting symptoms, physical exam findings, and initial radiographic and laboratory data in the context of their chronic comorbidities is felt to place them at high risk for further clinical deterioration. Furthermore, it is not anticipated that the patient will be medically stable for discharge from the hospital within 2 midnights of admission.   * I certify that at the point of admission it is my clinical judgment that the patient will require inpatient hospital care spanning beyond 2 midnights from the point of admission due to high intensity of service, high risk for further deterioration and high frequency of surveillance required.DEWAINE Morene Bathe, MD [facility] DEL. [facility]

## 2024-07-08 NOTE — Assessment & Plan Note (Signed)
 Ddx includes sepsis from infected pressure ulcers of heels,  digoxin  toxicity, acute renal failure, and nocturnal hypoxemia.  Sending to ER for evaluation

## 2024-07-08 NOTE — ED Notes (Signed)
 Patient transported to MRI

## 2024-07-08 NOTE — Patient Instructions (Signed)
 I am recommending that you go to the ER to have  an urgent evaluation of your heart,  kidneys,  blood and feet   The ulcers in your feet may have caused an infection in your blood or in your bones   The dogoxin you take for your heart may have reached a toxic level

## 2024-07-08 NOTE — Assessment & Plan Note (Signed)
 Patient refuses to consider placement despite being nonambulatory.  She currently has plans to increase the numbner of aides to provide assistance but her health is clearly failing and she is now developing pressure ulcers, I have strongly advised her to reconsider placement and at this point I believe she lacks the capacity to make intelligent decisions .

## 2024-07-08 NOTE — ED Notes (Signed)
 Critical Result: Lactic Acid 2.9  Ernest, MD made aware

## 2024-07-08 NOTE — ED Triage Notes (Addendum)
 Pt comes with confusion that started 2-3 weeks ago. Pt placed on new med and family not sure if that caused it. Pt also has ulcers on bottom of feet that might be infected.  Pt is not diabetic. Pt states her feet has bad and she has had people step on her.

## 2024-07-08 NOTE — ED Notes (Signed)
Shift report received, assumed care of patient at this time 

## 2024-07-08 NOTE — ED Notes (Signed)
IV team in room at this time.  

## 2024-07-08 NOTE — ED Provider Notes (Signed)
 [facility] Provider Note    Event Date/Time   First MD Initiated Contact with Patient 07/08/24 1916     (approximate)   History   Altered Mental Status   HPI  Alexandria Sherman is a 78 y.o. female  with mild cognitive impairment, chronic lymphedema who comes in with concerns for altered mental status.  Patient has 1 out of her portable oxygen tank but is post to be on oxygen 24/7 over the past 2 or 3 weeks she has been more agitated and confused with paranoia.  She has got pressure ulcers on both feet.  Patient reports that she has had difficulty ambulating secondary to this.  She reports that she is post to be on 2 L of oxygen at baseline.  She denies any new chest pain, shortness of breath, falls.  Denies any abdominal pain.  Reports pain in her heels after I removed her socks.     Physical Exam   Triage Vital Signs: ED Triage Vitals  Encounter Vitals Group     BP 07/08/24 1726 (!) 140/122     Girls Systolic BP Percentile --      Girls Diastolic BP Percentile --      Boys Systolic BP Percentile --      Boys Diastolic BP Percentile --      Pulse Rate 07/08/24 1726 88     Resp 07/08/24 1726 19     Temp 07/08/24 1726 98.8 F (37.1 C)     Temp Source 07/08/24 1726 Oral     SpO2 07/08/24 1726 91 %     Weight 07/08/24 1725 130 lb (59 kg)     Height 07/08/24 1725 5' 3 (1.6 m)     Head Circumference --      Peak Flow --      Pain Score 07/08/24 1731 7     Pain Loc --      Pain Education --      Exclude from Growth Chart --     Most recent vital signs: Vitals:   07/08/24 1726  BP: (!) 140/122  Pulse: 88  Resp: 19  Temp: 98.8 F (37.1 C)  SpO2: 91%     General: Awake, no distress.  CV:  Good peripheral perfusion.  Resp:  Normal effort.  Abd:  No distention.  Soft nontender Other:  Ulcers noted on heels.  She has got some black discoloration of a couple of her toes.  She has a very cold feet picture in media tab.  It was able to get DP  pulses with Doppler.   ED Results / Procedures / Treatments   Labs (all labs ordered are listed, but only abnormal results are displayed) Labs Reviewed  LACTIC ACID, PLASMA - Abnormal; Notable for the following components:      Result Value   Lactic Acid, Venous 2.9 (*)    All other components within normal limits  CBC - Abnormal; Notable for the following components:   WBC 10.7 (*)    RBC 3.59 (*)    Hemoglobin 11.5 (*)    MCV 103.3 (*)    All other components within normal limits  BASIC METABOLIC PANEL WITH GFR - Abnormal; Notable for the following components:   Chloride 87 (*)    CO2 39 (*)    Glucose, Bld 167 (*)    Creatinine, Ser 0.38 (*)    All other components within normal limits  LACTIC ACID, PLASMA  EKG  My interpretation of EKG:  Atrial fibrillation with a rate of 124 without any ST elevation or T wave inversions,  with right bundle branch block  RADIOLOGY I have reviewed the  ct personally and interpreted no evidence of intercranial hemorrhage but does have a potential meningioma   PROCEDURES:  Critical Care performed: No  .1-3 Lead EKG Interpretation  Performed by: Ernest Ronal BRAVO, MD Authorized by: Ernest Ronal BRAVO, MD     Interpretation: abnormal     ECG rate:  115   ECG rate assessment: tachycardic     Rhythm: atrial fibrillation     Ectopy: none     Conduction: normal      MEDICATIONS ORDERED IN ED: Medications  metoprolol  tartrate (LOPRESSOR ) injection 5 mg (has no administration in time range)     IMPRESSION / MDM / ASSESSMENT AND PLAN / ED COURSE  I reviewed the triage vital signs and the nursing notes.   Patient's presentation is most consistent with acute presentation with potential threat to life or bodily function.   Patient comes in with concerns for confusions, weakness, feet pain and inability to take care of herself.  Patient has necrotic looking wounds on her feet with very poor chronic perfusion issues she does have  pulses that were confirmed with Doppler.  I did discuss with Dr. Marea and given this concern for potential meningioma on her CT we are going to hold off on heparin  given this is a chronic process and make sure that the MRI does not show anything else more acute before starting heparin  he was okay with holding on heparin  and he will come and see patient tomorrow.  Patient was noted to be in A-fib normally her rates were controlled but I went to go reevaluate patient and her heart rates have come up the hospitalist did order her some IV metoprolol .  Her port function was reassuring lactate was slightly elevated but holding on fluids due to concerns for some swelling in her legs.  TSH was normal pH is normal she has got some chronic hypercapnia.  I suspect her heart rates have come up due to her having missed some medications.  Patient was admitted to the hospital team for further workup  BMP shows elevated CO2 similar to prior.  Low chloride.  Creatinine is at baseline.  Lactate is elevated.  CBC shows slightly low hemoglobin.  IMPRESSION: 1. No evidence of an acute intracranial abnormality on this motion-degraded study. 2. 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    The patient is on the cardiac monitor to evaluate for evidence of arrhythmia and/or significant heart rate changes.      FINAL CLINICAL IMPRESSION(S) / ED DIAGNOSES   Final diagnoses:  Meningioma (HCC)  Atrial fibrillation, unspecified type (HCC)  Ischemic ulcer of both feet, unspecified ulcer stage (HCC)  PAD (peripheral artery disease)     Rx / DC Orders   ED Discharge Orders     None        Note:  This document was prepared using Dragon voice recognition software and may include unintentional dictation errors.   Ernest Ronal BRAVO, MD 07/08/24 2230

## 2024-07-08 NOTE — ED Notes (Signed)
 Placed patient on 2L [place], RA SpO2 83-88%, Patients SpO2 increased to 95% on 2L [place]

## 2024-07-09 ENCOUNTER — Inpatient Hospital Stay

## 2024-07-09 DIAGNOSIS — I70223 Atherosclerosis of native arteries of extremities with rest pain, bilateral legs: Secondary | ICD-10-CM | POA: Diagnosis present

## 2024-07-09 DIAGNOSIS — Z79899 Other long term (current) drug therapy: Secondary | ICD-10-CM | POA: Diagnosis not present

## 2024-07-09 DIAGNOSIS — I2510 Atherosclerotic heart disease of native coronary artery without angina pectoris: Secondary | ICD-10-CM | POA: Diagnosis present

## 2024-07-09 DIAGNOSIS — Z7901 Long term (current) use of anticoagulants: Secondary | ICD-10-CM | POA: Diagnosis not present

## 2024-07-09 DIAGNOSIS — R6521 Severe sepsis with septic shock: Secondary | ICD-10-CM | POA: Diagnosis not present

## 2024-07-09 DIAGNOSIS — L97529 Non-pressure chronic ulcer of other part of left foot with unspecified severity: Secondary | ICD-10-CM | POA: Diagnosis present

## 2024-07-09 DIAGNOSIS — I4891 Unspecified atrial fibrillation: Secondary | ICD-10-CM

## 2024-07-09 DIAGNOSIS — Z7189 Other specified counseling: Secondary | ICD-10-CM

## 2024-07-09 DIAGNOSIS — Z515 Encounter for palliative care: Secondary | ICD-10-CM | POA: Diagnosis not present

## 2024-07-09 DIAGNOSIS — I110 Hypertensive heart disease with heart failure: Secondary | ICD-10-CM | POA: Diagnosis present

## 2024-07-09 DIAGNOSIS — D329 Benign neoplasm of meninges, unspecified: Secondary | ICD-10-CM | POA: Diagnosis not present

## 2024-07-09 DIAGNOSIS — E872 Acidosis, unspecified: Secondary | ICD-10-CM | POA: Diagnosis present

## 2024-07-09 DIAGNOSIS — F419 Anxiety disorder, unspecified: Secondary | ICD-10-CM | POA: Diagnosis present

## 2024-07-09 DIAGNOSIS — Z66 Do not resuscitate: Secondary | ICD-10-CM | POA: Diagnosis present

## 2024-07-09 DIAGNOSIS — G928 Other toxic encephalopathy: Secondary | ICD-10-CM | POA: Diagnosis not present

## 2024-07-09 DIAGNOSIS — R410 Disorientation, unspecified: Secondary | ICD-10-CM | POA: Diagnosis not present

## 2024-07-09 DIAGNOSIS — I951 Orthostatic hypotension: Secondary | ICD-10-CM | POA: Diagnosis not present

## 2024-07-09 DIAGNOSIS — E876 Hypokalemia: Secondary | ICD-10-CM | POA: Diagnosis present

## 2024-07-09 DIAGNOSIS — E0590 Thyrotoxicosis, unspecified without thyrotoxic crisis or storm: Secondary | ICD-10-CM | POA: Diagnosis present

## 2024-07-09 DIAGNOSIS — L03115 Cellulitis of right lower limb: Secondary | ICD-10-CM | POA: Diagnosis present

## 2024-07-09 DIAGNOSIS — R627 Adult failure to thrive: Secondary | ICD-10-CM | POA: Diagnosis present

## 2024-07-09 DIAGNOSIS — R4182 Altered mental status, unspecified: Secondary | ICD-10-CM | POA: Diagnosis present

## 2024-07-09 DIAGNOSIS — I70263 Atherosclerosis of native arteries of extremities with gangrene, bilateral legs: Secondary | ICD-10-CM | POA: Diagnosis present

## 2024-07-09 DIAGNOSIS — L03031 Cellulitis of right toe: Secondary | ICD-10-CM | POA: Diagnosis not present

## 2024-07-09 DIAGNOSIS — J9621 Acute and chronic respiratory failure with hypoxia: Secondary | ICD-10-CM | POA: Diagnosis not present

## 2024-07-09 DIAGNOSIS — L03119 Cellulitis of unspecified part of limb: Secondary | ICD-10-CM | POA: Diagnosis not present

## 2024-07-09 DIAGNOSIS — I10 Essential (primary) hypertension: Secondary | ICD-10-CM | POA: Diagnosis not present

## 2024-07-09 DIAGNOSIS — I70203 Unspecified atherosclerosis of native arteries of extremities, bilateral legs: Secondary | ICD-10-CM | POA: Diagnosis present

## 2024-07-09 DIAGNOSIS — I4811 Longstanding persistent atrial fibrillation: Secondary | ICD-10-CM | POA: Diagnosis not present

## 2024-07-09 DIAGNOSIS — E860 Dehydration: Secondary | ICD-10-CM | POA: Diagnosis present

## 2024-07-09 DIAGNOSIS — A419 Sepsis, unspecified organism: Secondary | ICD-10-CM | POA: Diagnosis present

## 2024-07-09 DIAGNOSIS — R6520 Severe sepsis without septic shock: Secondary | ICD-10-CM | POA: Diagnosis present

## 2024-07-09 DIAGNOSIS — E785 Hyperlipidemia, unspecified: Secondary | ICD-10-CM | POA: Insufficient documentation

## 2024-07-09 DIAGNOSIS — I5032 Chronic diastolic (congestive) heart failure: Secondary | ICD-10-CM | POA: Diagnosis present

## 2024-07-09 DIAGNOSIS — L97519 Non-pressure chronic ulcer of other part of right foot with unspecified severity: Secondary | ICD-10-CM | POA: Diagnosis present

## 2024-07-09 DIAGNOSIS — I4819 Other persistent atrial fibrillation: Secondary | ICD-10-CM | POA: Diagnosis present

## 2024-07-09 DIAGNOSIS — I878 Other specified disorders of veins: Secondary | ICD-10-CM | POA: Diagnosis not present

## 2024-07-09 DIAGNOSIS — E039 Hypothyroidism, unspecified: Secondary | ICD-10-CM | POA: Diagnosis present

## 2024-07-09 LAB — URINALYSIS, ROUTINE W REFLEX MICROSCOPIC
Bacteria, UA: NONE SEEN
Bilirubin Urine: NEGATIVE
Glucose, UA: NEGATIVE mg/dL
Ketones, ur: NEGATIVE mg/dL
Nitrite: NEGATIVE
Protein, ur: 100 mg/dL — AB
RBC / HPF: >50 RBC/hpf (ref 0–5)
Specific Gravity, Urine: 1.014 (ref 1.005–1.030)
WBC, UA: >50 WBC/hpf (ref 0–5)
pH: 5 (ref 5.0–8.0)

## 2024-07-09 LAB — LIPID PANEL
Cholesterol: 174 mg/dL (ref 0–200)
HDL: 40 mg/dL — ABNORMAL LOW (ref >40)
LDL Cholesterol: 103 mg/dL — ABNORMAL HIGH (ref 0–99)
Total CHOL/HDL Ratio: 4.4 ratio
Triglycerides: 154 mg/dL — ABNORMAL HIGH (ref <150)
VLDL: 31 mg/dL (ref 0–40)

## 2024-07-09 LAB — BLOOD GAS, VENOUS
Bicarbonate: 45.9 mmol/L — ABNORMAL HIGH (ref 20.0–28.0)
Patient temperature: 37 mmol/L — AB (ref 0.0–2.0)
pCO2, Ven: 63 mmHg — ABNORMAL HIGH (ref 44–60)
pH, Ven: 7.47 — ABNORMAL HIGH (ref 7.25–7.43)
pO2, Ven: 45.9 mmHg — AB (ref 32–45)

## 2024-07-09 LAB — LACTIC ACID, PLASMA
Lactic Acid, Venous: 2.1 mmol/L (ref 0.5–1.9)
Lactic Acid, Venous: 2.4 mmol/L (ref 0.5–1.9)

## 2024-07-09 MED ORDER — METOPROLOL SUCCINATE ER 50 MG PO TB24
100.0000 mg | ORAL_TABLET | Freq: Every day | ORAL | Status: DC
Start: 2024-07-10 — End: 2024-07-09

## 2024-07-09 MED ORDER — APIXABAN 5 MG PO TABS
5.0000 mg | ORAL_TABLET | Freq: Two times a day (BID) | ORAL | Status: DC
  Administered 2024-07-09 (×2): 5 mg via ORAL
  Filled 2024-07-09 (×2): qty 1

## 2024-07-09 MED ORDER — SODIUM CHLORIDE 0.9 % IV BOLUS
500.0000 mL | Freq: Once | INTRAVENOUS | Status: AC
  Administered 2024-07-09: 500 mL via INTRAVENOUS

## 2024-07-09 MED ORDER — METOPROLOL SUCCINATE ER 50 MG PO TB24
100.0000 mg | ORAL_TABLET | Freq: Two times a day (BID) | ORAL | Status: DC
  Administered 2024-07-09: 100 mg via ORAL
  Filled 2024-07-09: qty 2

## 2024-07-09 MED ORDER — DILTIAZEM HCL 90 MG PO TABS
45.0000 mg | ORAL_TABLET | Freq: Four times a day (QID) | ORAL | Status: DC
Start: 2024-07-09 — End: 2024-07-10
  Administered 2024-07-09: 45 mg via ORAL
  Filled 2024-07-09 (×4): qty 0.5

## 2024-07-09 MED ORDER — DILTIAZEM HCL-DEXTROSE 125-5 MG/125ML-% IV SOLN (PREMIX)
5.0000 mg/h | INTRAVENOUS | Status: DC
  Administered 2024-07-09: 5 mg/h via INTRAVENOUS
  Filled 2024-07-09: qty 125

## 2024-07-09 MED ORDER — HALOPERIDOL LACTATE 5 MG/ML IJ SOLN
1.0000 mg | Freq: Four times a day (QID) | INTRAMUSCULAR | Status: DC | PRN
Start: 2024-07-09 — End: 2024-07-14
  Administered 2024-07-09: 2 mg via INTRAMUSCULAR
  Administered 2024-07-10: 1 mg via INTRAMUSCULAR
  Filled 2024-07-09 (×3): qty 1

## 2024-07-09 MED ORDER — DILTIAZEM HCL 60 MG PO TABS
30.0000 mg | ORAL_TABLET | Freq: Four times a day (QID) | ORAL | Status: DC
  Administered 2024-07-09: 30 mg via ORAL
  Filled 2024-07-09: qty 1

## 2024-07-09 MED ORDER — TIMOLOL MALEATE 0.5 % OP SOLN
1.0000 [drp] | Freq: Every day | OPHTHALMIC | Status: DC
  Administered 2024-07-10 – 2024-07-13 (×4): 1 [drp] via OPHTHALMIC
  Filled 2024-07-09: qty 5

## 2024-07-09 MED ORDER — SODIUM CHLORIDE 0.9 % IV SOLN
Freq: Once | INTRAVENOUS | Status: AC
Start: 2024-07-09 — End: 2024-07-10
  Administered 2024-07-09: 500 mL via INTRAVENOUS

## 2024-07-09 MED ORDER — METHIMAZOLE 2.5 MG HALF TABLET
2.5000 mg | ORAL_TABLET | Freq: Every day | ORAL | Status: DC
Start: 2024-07-09 — End: 2024-07-14
  Administered 2024-07-09 – 2024-07-13 (×5): 2.5 mg via ORAL
  Filled 2024-07-09 (×7): qty 1

## 2024-07-09 MED ORDER — FUROSEMIDE 40 MG PO TABS
20.0000 mg | ORAL_TABLET | Freq: Every day | ORAL | Status: DC
  Administered 2024-07-09: 20 mg via ORAL
  Filled 2024-07-09: qty 1

## 2024-07-09 MED ORDER — DIVALPROEX SODIUM 125 MG PO DR TAB
125.0000 mg | DELAYED_RELEASE_TABLET | Freq: Two times a day (BID) | ORAL | Status: DC
Start: 2024-07-09 — End: 2024-07-14
  Administered 2024-07-09 – 2024-07-13 (×10): 125 mg via ORAL
  Filled 2024-07-09 (×13): qty 1

## 2024-07-09 MED ORDER — QUETIAPINE FUMARATE 25 MG PO TABS
12.5000 mg | ORAL_TABLET | Freq: Every day | ORAL | Status: DC
  Administered 2024-07-09 – 2024-07-13 (×5): 12.5 mg via ORAL
  Filled 2024-07-09 (×5): qty 1

## 2024-07-09 MED ORDER — PANTOPRAZOLE SODIUM 40 MG PO TBEC
40.0000 mg | DELAYED_RELEASE_TABLET | Freq: Every day | ORAL | Status: DC
  Administered 2024-07-09 – 2024-07-13 (×5): 40 mg via ORAL
  Filled 2024-07-09 (×6): qty 1

## 2024-07-09 MED ORDER — VENLAFAXINE HCL 37.5 MG PO TABS
75.0000 mg | ORAL_TABLET | Freq: Two times a day (BID) | ORAL | Status: DC
Start: 2024-07-09 — End: 2024-07-14
  Administered 2024-07-09 – 2024-07-13 (×10): 75 mg via ORAL
  Filled 2024-07-09 (×12): qty 2

## 2024-07-09 MED ORDER — ACETAMINOPHEN 650 MG RE SUPP: 650.0000 mg | Freq: Four times a day (QID) | RECTAL | Status: DC | PRN

## 2024-07-09 MED ORDER — SENNOSIDES-DOCUSATE SODIUM 8.6-50 MG PO TABS
1.0000 | ORAL_TABLET | Freq: Every evening | ORAL | Status: DC | PRN
Start: 2024-07-09 — End: 2024-07-10

## 2024-07-09 MED ORDER — MIDODRINE HCL 5 MG PO TABS
10.0000 mg | ORAL_TABLET | Freq: Three times a day (TID) | ORAL | Status: DC
Start: 2024-07-09 — End: 2024-07-14
  Administered 2024-07-09 – 2024-07-13 (×12): 10 mg via ORAL
  Filled 2024-07-09 (×13): qty 2

## 2024-07-09 MED ORDER — LACTATED RINGERS IV SOLN: INTRAVENOUS | Status: DC

## 2024-07-09 MED ORDER — ACETAMINOPHEN 325 MG PO TABS
650.0000 mg | ORAL_TABLET | Freq: Four times a day (QID) | ORAL | Status: DC | PRN
  Administered 2024-07-12: 650 mg via ORAL
  Filled 2024-07-09: qty 2

## 2024-07-09 NOTE — Consult Note (Signed)
 Hospital Consult    Reason for Consult:  Ischemic changes to bilateral lower extremities Requesting Physician:  Dr Ronal Lewandowsky MD  MRN #:  [id number]  History of Present Illness: This is a 78 y.o. female with past medical history of HTN, HLD, paroxsymal atrial fibrillation, hyperthyroidism, venous stasis, and mild cognitive impairment presenting to the ED from the clinic due to concern for altered mental status and hallucination. Appears PCP advised pt to seek care on 10/09 but pt refused along with ischemic changes noted on her feet. Pt states she is feeling well and currently doesn't have any complaints. When talking about her feet, she states she doesn't know how long they have been like this but it is not painful and her feet stay cold all the time. Pt states she sees visual hallucinations and they have been present for over a week. She denies any hx of psychiatric disorder. Prior to hospitalization, pt was still dependant for ADLs and iADLs but it was exacerbated by her hospitalization. She has been declining per the sister and was recommended hospice by PCP but pt switched PCP. Patient has undergone bilateral Arterial and venous ultrasounds in August of 2025. Testing was negative for any arterial occlusive disease or DVT. Vascular surgery consulted to evaluate.    Past Medical History:  Diagnosis Date   Acute lateral meniscus tear of right knee    Anxiety disorder, unspecified    Atrial fibrillation with rapid ventricular response (HCC) 05/11/2024   Cataract [facility]   Clotting disorder blood circulation/veins in feet and lower legs   Depression 1978   Diverticulosis of colon 04/2005   GERD (gastroesophageal reflux disease)    Glaucoma [facility]   Hypertension    Hyperthyroidism    Dr Damian   Impaired fasting glucose    Low TSH level    chronic low, but euthyroid   Microscopic hematuria    Multiple thyroid  nodules    Persistent atrial fibrillation (HCC)     RBBB    S/p left hip fracture    Sepsis (HCC) 05/14/2024   Sleep disturbance    Subclinical hyperthyroidism    unspecified   Tibia fracture 05/2010   left    Past Surgical History:  Procedure Laterality Date   ABDOMINAL HYSTERECTOMY  1988   hysterectomy/ BSO for endometriosis   BREAST BIOPSY Right 1992   neg   BREAST EXCISIONAL BIOPSY Right 1988   benign per pt   INTRAMEDULLARY (IM) NAIL INTERTROCHANTERIC Left 01/15/2022   Procedure: INTRAMEDULLARY (IM) NAIL INTERTROCHANTRIC;  Surgeon: Cleotilde Barrio, MD;  Location: [facility] ORS;  Service: Orthopedics;  Laterality: Left;   KNEE ARTHROSCOPY  6/12   left--Dr Ernie    Allergies  Allergen Reactions   Doxycycline  Other (See Comments)    Hallucinations, nocturnal:  In the night felt like she was flipping in the bed.   Augmentin [Amoxicillin-Pot Clavulanate] Other (See Comments)    Pt cannot remember   Cephalexin     REACTION: vaginal blisters Tolerates penicillins   Codeine Phosphate     REACTION: vomiting   Promethazine Hcl     REACTION: nerves    Prior to Admission medications   Medication Sig Start Date End Date Taking? Authorizing Provider  apixaban  (ELIQUIS ) 5 MG TABS tablet Take 1 tablet (5 mg total) by mouth 2 (two) times daily. 05/21/24   Dorinda Drue DASEN, MD  clorazepate  (TRANXENE ) 3.75 MG tablet TAKE 1 TABLET BY MOUTH 3 TIMES DAILY AS NEEDED FOR ANXIETY. 01/24/24  Jimmy Charlie FERNS, MD  clotrimazole -betamethasone  (LOTRISONE ) cream Apply 1 Application topically daily. 06/02/24   Webb, Padonda B, FNP  diphenhydrAMINE  (BENADRYL ) 25 MG tablet Take 25 mg by mouth every 6 (six) hours as needed.    [provider]  divalproex  (DEPAKOTE ) 125 MG DR tablet Take 1 tablet (125 mg total) by mouth 2 (two) times daily. 07/03/24   Marylynn Verneita CROME, MD  furosemide  (LASIX ) 20 MG tablet Take 1 tablet (20 mg total) by mouth daily. 06/25/24   Vivienne Lonni Ingle, NP  ibuprofen (ADVIL,MOTRIN) 200 MG tablet Take 200 mg by mouth every 6  (six) hours as needed.    [provider]  latanoprost  (XALATAN ) 0.005 % ophthalmic solution Place 1 drop into both eyes at bedtime. 01/15/12   [provider]  meclizine  (ANTIVERT ) 25 MG tablet TAKE ONE TABLET DAILY AS NEEDED 12/12/22   Jimmy Charlie FERNS, MD  methimazole  (TAPAZOLE ) 5 MG tablet Take 0.5 tablets (2.5 mg total) by mouth daily. 05/22/24   Dorinda Drue DASEN, MD  metoprolol  succinate (TOPROL -XL) 100 MG 24 hr tablet Take 1 tablet (100 mg total) by mouth 2 (two) times daily. Take with or immediately following a meal. 07/03/24   Marylynn Verneita CROME, MD  mometasone  (ELOCON ) 0.1 % cream Apply 1 Application topically 2 (two) times daily as needed (itching). 12/12/22   Jimmy Charlie FERNS, MD  nystatin  (MYCOSTATIN /NYSTOP ) powder Apply 1 Application topically 3 (three) times daily. 04/04/24   [provider]  omeprazole  (PRILOSEC ) 20 MG capsule Take 1 capsule (20 mg total) by mouth daily. 07/03/24   Tullo, Teresa L, MD  polyethylene glycol (MIRALAX  / GLYCOLAX ) 17 g packet Take 17 g by mouth daily as needed for mild constipation. 01/18/22   Alexander, Natalie, DO  QUEtiapine  (SEROQUEL ) 25 MG tablet Take 0.5 tablets (12.5 mg total) by mouth daily after supper. 05/21/24   Dorinda Drue DASEN, MD  timolol  (TIMOPTIC ) 0.5 % ophthalmic solution Place 1 drop into both eyes daily.    [provider]  venlafaxine  (EFFEXOR ) 75 MG tablet Take 1 tablet (75 mg total) by mouth 2 (two) times daily. 07/03/24   Marylynn Verneita CROME, MD  Calcium  Carbonate (CALCIUM  500 PO) Take one by mouth twice a day   07/17/19  [provider]    Social History   Socioeconomic History   Marital status: Divorced    Spouse name: Not on file   Number of children: 0   Years of education: 16   Highest education level: Bachelor's degree (e.g., BA, AB, BS)  Occupational History   Occupation: Teacher--retired  Tobacco Use   Smoking status: Never    Passive exposure: Past   Smokeless tobacco: Never  Vaping Use    Vaping status: Never Used  Substance and Sexual Activity   Alcohol use: Yes    Comment: Occasional   Drug use: Not on file   Sexual activity: Not Currently  Other Topics Concern   Not on file  Social History Narrative   Has living will   Both sisters have health care POA--- forms in chart   Now has DNR   No tube feeds if cognitively unaware   Social Drivers of Health   Financial Resource Strain: Low Risk  (03/31/2024)   Overall Financial Resource Strain (CARDIA)    Difficulty of Paying Living Expenses: Not hard at all  Food Insecurity: No Food Insecurity (05/27/2024)   Hunger Vital Sign    Worried About Running Out of Food in the Last  Year: Never true    Ran Out of Food in the Last Year: Never true  Recent Concern: Food Insecurity - Food Insecurity Present (05/10/2024)   Hunger Vital Sign    Worried About Running Out of Food in the Last Year: Sometimes true    Ran Out of Food in the Last Year: Sometimes true  Transportation Needs: No Transportation Needs (05/27/2024)   PRAPARE - Administrator, Civil Service (Medical): No    Lack of Transportation (Non-Medical): No  Physical Activity: Inactive (03/31/2024)   Exercise Vital Sign    Days of Exercise per Week: 0 days    Minutes of Exercise per Session: 0 min  Stress: Stress Concern Present (03/31/2024)   [employer] of Occupational Health - Occupational Stress Questionnaire    Feeling of Stress: Rather much  Social Connections: Socially Isolated (05/10/2024)   Social Connection and Isolation Panel    Frequency of Communication with Friends and Family: More than three times a week    Frequency of Social Gatherings with Friends and Family: Once a week    Attends Religious Services: Never    Database administrator or Organizations: No    Attends Banker Meetings: Never    Marital Status: Divorced  Catering manager Violence: Patient Unable To Answer (05/27/2024)   Humiliation, Afraid, Rape, and Kick  questionnaire    Fear of Current or Ex-Partner: Patient unable to answer    Emotionally Abused: Patient unable to answer    Physically Abused: Patient unable to answer    Sexually Abused: Patient unable to answer     Family History  Problem Relation Age of Onset   Cancer Mother        lung   Diabetes Mother    Depression Mother    Depression Sister    Parkinson's disease Sister    Cancer Sister    Depression Sister    Stroke Other    Heart attack Other    Cancer Other    Dementia Other    Breast cancer Neg Hx    Thyroid  disease Neg Hx     ROS: Otherwise negative unless mentioned in HPI  Physical Examination  Vitals:   07/09/24 0500 07/09/24 0630  BP: 138/80 100/84  Pulse:  82  Resp: (!) 24 (!) 23  Temp:    SpO2: 94% 100%   Body mass index is 23.03 kg/m.  General:  WDWN in NAD Gait: Not observed HENT: WNL, normocephalic Pulmonary: normal non-labored breathing, without Rales, rhonchi,  wheezing Cardiac: irregular, without  Murmurs, rubs or gallops; without carotid bruits Abdomen: Positive bowel sounds throughout, soft, NT/ND, no masses Skin: without rashes Vascular Exam/Pulses: Doppler pulses are weak to bilateral lower extremities in both DP/PT  Extremities: with ischemic changes, with Gangrene , with cellulitis; with open wounds;  Musculoskeletal: no muscle wasting or atrophy  Neurologic: A&O X 3;  No focal weakness or paresthesias are detected; speech is fluent/normal Psychiatric:  The pt has Normal affect. Lymph:  Unremarkable  CBC    Component Value Date/Time   WBC 10.7 (H) 07/08/2024 1733   RBC 3.59 (L) 07/08/2024 1733   HGB 11.5 (L) 07/08/2024 1733   HCT 37.1 07/08/2024 1733   PLT 232 07/08/2024 1733   MCV 103.3 (H) 07/08/2024 1733   MCH 32.0 07/08/2024 1733   MCHC 31.0 07/08/2024 1733   RDW 15.1 07/08/2024 1733   LYMPHSABS 1.1 05/18/2024 0403   MONOABS 1.1 (H) 05/18/2024 0403   EOSABS  0.1 05/18/2024 0403   BASOSABS 0.0 05/18/2024 0403     BMET    Component Value Date/Time   NA 141 07/08/2024 1733   K 3.6 07/08/2024 1733   CL 87 (L) 07/08/2024 1733   CO2 39 (H) 07/08/2024 1733   GLUCOSE 167 (H) 07/08/2024 1733   BUN 19 07/08/2024 1733   CREATININE 0.38 (L) 07/08/2024 1733   CALCIUM  9.3 07/08/2024 1733   GFRNONAA >60 07/08/2024 1733   GFRAA >60 08/07/2017 0450    COAGS: Lab Results  Component Value Date   INR 1.1 05/10/2024   INR 1.0 01/15/2022     Non-Invasive Vascular Imaging:   EXAM: CT HEAD WITHOUT CONTRAST 07/08/2024 06:03:38 PM   TECHNIQUE: CT of the head was performed without the administration of intravenous contrast. Automated exposure control, iterative reconstruction, and/or weight based adjustment of the mA/kV was utilized to reduce the radiation dose to as low as reasonably achievable.   COMPARISON: Head CT 01/14/2022 and MRI 04/17/2013.   CLINICAL HISTORY: Altered mental status.   FINDINGS:   BRAIN AND VENTRICLES: Within limitations of moderate motion artifact, no acute large territory infarct, intrarenal hemorrhage, midline shift, extra axial fluid collection, or hydrocephalus is identified. There is mild cerebral atrophy. Cerebral white matter hypodensities are grossly similar to the prior CT and are nonspecific but compatible with moderate chronic small vessel ischemic disease. An approximately 1.2 cm calcified extra axial mass over the right temporoparietal convexity is similar to the prior CT without evidence of associated brain edema. There is associated mild skull hyperostosis.   ORBITS: No acute abnormality.   SINUSES: No acute abnormality.   SOFT TISSUES AND SKULL: No acute soft tissue abnormality. No skull fracture.   IMPRESSION: 1. No evidence of an acute intracranial abnormality on this motion-degraded study. 2. 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  EXAM: MRI BRAIN WITHOUT CONTRAST 07/08/2024 11:20:39 PM   TECHNIQUE: Multiplanar multisequence MRI  of the head/brain was performed without the administration of intravenous contrast. Examination severely degraded by motion artifact.   COMPARISON: Comparison made with prior CT from earlier the same day.   CLINICAL HISTORY: Meningioma. Best obtainable images pt coached on motion. Pt did not cooperate ; Per MD: 78 y.o. female with mild cognitive impairment, chronic lymphedema who comes in with concerns for altered mental status. she has been more agitated and confused with paranoia.for 2 weeks.   FINDINGS:   BRAIN AND VENTRICLES: Examination severely degraded by motion artifact. Cerebral volume at the normal limits. Patchy T2 FLAIR hyperintensity involving the periventricular and deep white matter, consistent with chronic subvascular ischemic disease, moderate in nature. No evidence for acute or subacute infarct. No areas of chronic cortical infarction. No acute intracranial hemorrhage. No visible chronic intracranial blood products. 1.6 cm extra axial mass overlying the right temporal convexity, consistent with a meningioma (series 10, image 13). Additional 1.7 cm en plaque left parafalcine meningioma at the skull vertex (series 10, 24). No associated edema or mass effect about these lesions. No midline shift. No hydrocephalus. The sella is unremarkable. Normal flow voids.   ORBITS: No acute abnormality.   SINUSES AND MASTOIDS: No acute abnormality.   BONES AND SOFT TISSUES: Normal marrow signal. No acute soft tissue abnormality.   IMPRESSION: 1. No acute intracranial abnormality. 2. 1.6 cm meningioma overlying the right temporal convexity, with additional 1.7 cm left parafalcine meningioma at the skull vertex. No associated edema or mass effect.   EXAM:05/15/2024 BILATERAL LOWER EXTREMITY ARTERIAL DUPLEX SCAN   TECHNIQUE: Gray-scale  sonography as well as color Doppler and duplex ultrasound was performed to evaluate the arteries of both lower extremities including the  common, superficial and profunda femoral arteries, popliteal artery and calf arteries.   COMPARISON:  None Available.   FINDINGS: Right Lower Extremity   Inflow: Normal common femoral arterial waveforms and velocities. No evidence of inflow (aortoiliac) disease.   Outflow: Normal profunda femoral, superficial femoral and popliteal arterial waveforms and velocities. No focal elevation of the PSV to suggest stenosis. Mild calcified plaque in the right SFA.   Runoff: Triphasic posterior tibial and biphasic anterior tibial waveforms with normal velocities.   Left Lower Extremity   Inflow: Normal common femoral arterial waveforms and velocities. No evidence of inflow (aortoiliac) disease.   Outflow: Normal profunda femoral, superficial femoral and popliteal arterial waveforms and velocities. No focal elevation of the PSV to suggest stenosis. Mild calcified plaque in the left SFA.   Runoff: Biphasic posterior tibial and triphasic anterior tibial waveforms with normal velocities.   IMPRESSION: No evidence of significant arterial occlusive disease in either lower extremity. Mild calcified plaque in bilateral superficial femoral arteries.    Statin:  No. Beta Blocker:  Yes.   Aspirin :  No. ACEI:  No. ARB:  No. CCB use:  No Other antiplatelets/anticoagulants:  Yes.   Eliquis  5 MG BID    ASSESSMENT/PLAN: This is a 78 y.o. female with past Medical history of venous stasis for over a decade. She presents to [facility] emergency department due to mild cognitive impairment and altered mental status with hallucinations. Upon work up patient underwent CT of the head showing a newly diagnosed 1.2 Cm meningioma. Patient remains in atrial fibrillation.  Patients last Lactic acid at 3:20 am was noted to be 2.4, H&H was 11.5/37.1, Bun and Creatinine was 19/.38  I had a very long and very detailed conversation with Ms Alexandria Sherman this morning about her current condition. We discussed in detail the  need for her to have this procedure today due to elevated lactic acid levels related to her tissue degeneration of her heels and right great toe. I discussed in detail the procedure, benefits, risks and complications. Patient at this time does not wish to undergo any procedures she would like to know what the cost of the procedure is before going forward . I also discussed how any increase in infection to her lower legs could make her septic and put her at high risk of death. She verbalizes her understanding but is not wanting to have a procedure.    -I discussed the case in detail with Dr Selinda Gu MD and he agrees with the plan.    Gwendlyn JONELLE [facility]ank [facility] 07/09/2024 7:10 AM

## 2024-07-09 NOTE — ED Notes (Signed)
 Pt extremely agitated, unable to be reoriented to situation, pt attempting to get out of bed stating I have got to get out of here and go home. Attempted to position pt in bed. Dr. Lawence made aware. PRN Haldol ordered and given.

## 2024-07-09 NOTE — Consult Note (Cosign Needed Addendum)
 Consultation Note Date: 07/09/2024   Patient Name: Alexandria Sherman  DOB: [birth year]  MRN: [id number]  Age / Sex: 78 y.o., female  PCP: Marylynn Verneita CROME, MD Referring Physician: Kandis Devaughn Sayres, MD  Reason for Consultation: Establishing goals of care  HPI/Patient Profile: Alexandria Sherman is a 78 y.o. year old female with past medical history of HTN, HLD, atrial fibrillation w/ RVR , hyperthyroidism, presenting to the ED due to concern for altered mental status and hallucinations. Appears from notes, PCP advised pt to seek care on 10/09 but pt refused along with ischemic changes noted on her feet.   Clinical Assessment and Goals of Care: Notes and labs reviewed from current admission and previous notes.  Advanced directive reviewed in detail.  In to see patient, she was resting in bed at this time.  She is able to tell me her full name, that we are in [place] at a hospital, the year is 2025, the president is Trump, and that she is at the hospital because of hallucinations.    She states she is divorced, and does not have children.  She states she lives alone, but due to poor mobility, has several paid caregivers that come in to assist her.  She states she has 2 sisters, [name] that lives in [place] , and Jan that lives in [place] .   She states she has completed an advance directive, and the AD was discussed. The document is located under the ACP tab. She confirms her sister [name] to be her Runner, broadcasting/film/video.  She discusses that she has been hallucinating, seeing young children and families walking by her house.  She states this was very concerning to her as when she saw them, if she looked away and looked back, they were gone.  She states she is wondering if the hallucinations are due to a medication that was started a few months ago.  Cardiology and to bedside to speak with patient.  PMT  stepped out.  PMT will continue to follow.   SUMMARY OF RECOMMENDATIONS   PMT will follow     Primary Diagnoses: Present on Admission:  Critical limb ischemia of both lower extremities (HCC)  Atrial fibrillation (HCC)  Chronic diastolic CHF (congestive heart failure) (HCC)  Essential hypertension  Hyperthyroidism  PAD (peripheral artery disease)   I have reviewed the medical record, interviewed the patient and family, and examined the patient. The following aspects are pertinent.  Past Medical History:  Diagnosis Date   Acute lateral meniscus tear of right knee    Anxiety disorder, unspecified    Atrial fibrillation with rapid ventricular response (HCC) 05/11/2024   Cataract [facility]   Clotting disorder blood circulation/veins in feet and lower legs   Depression 1978   Diverticulosis of colon 04/2005   GERD (gastroesophageal reflux disease)    Glaucoma [facility]   Hypertension    Hyperthyroidism    Dr Damian   Impaired fasting glucose    Low TSH level    chronic  low, but euthyroid   Microscopic hematuria    Multiple thyroid  nodules    Persistent atrial fibrillation (HCC)    RBBB    S/p left hip fracture    Sepsis (HCC) 05/14/2024   Sleep disturbance    Subclinical hyperthyroidism    unspecified   Tibia fracture 05/2010   left   Social History   Socioeconomic History   Marital status: Divorced    Spouse name: Not on file   Number of children: 0   Years of education: 16   Highest education level: Bachelor's degree (e.g., BA, AB, BS)  Occupational History   Occupation: Teacher--retired  Tobacco Use   Smoking status: Never    Passive exposure: Past   Smokeless tobacco: Never  Vaping Use   Vaping status: Never Used  Substance and Sexual Activity   Alcohol use: Yes    Comment: Occasional   Drug use: Not on file   Sexual activity: Not Currently  Other Topics Concern   Not on file  Social History Narrative   Has living will   Both  sisters have health care POA--- forms in chart   Now has DNR   No tube feeds if cognitively unaware   Social Drivers of Health   Financial Resource Strain: Low Risk  (03/31/2024)   Overall Financial Resource Strain (CARDIA)    Difficulty of Paying Living Expenses: Not hard at all  Food Insecurity: No Food Insecurity (07/09/2024)   Hunger Vital Sign    Worried About Running Out of Food in the Last Year: Never true    Ran Out of Food in the Last Year: Never true  Recent Concern: Food Insecurity - Food Insecurity Present (05/10/2024)   Hunger Vital Sign    Worried About Running Out of Food in the Last Year: Sometimes true    Ran Out of Food in the Last Year: Sometimes true  Transportation Needs: No Transportation Needs (07/09/2024)   PRAPARE - Administrator, Civil Service (Medical): No    Lack of Transportation (Non-Medical): No  Physical Activity: Inactive (03/31/2024)   Exercise Vital Sign    Days of Exercise per Week: 0 days    Minutes of Exercise per Session: 0 min  Stress: Stress Concern Present (03/31/2024)   [employer] of Occupational Health - Occupational Stress Questionnaire    Feeling of Stress: Rather much  Social Connections: Socially Isolated (07/09/2024)   Social Connection and Isolation Panel    Frequency of Communication with Friends and Family: More than three times a week    Frequency of Social Gatherings with Friends and Family: Once a week    Attends Religious Services: Never    Database administrator or Organizations: No    Attends Engineer, structural: Never    Marital Status: Divorced   Family History  Problem Relation Age of Onset   Cancer Mother        lung   Diabetes Mother    Depression Mother    Depression Sister    Parkinson's disease Sister    Cancer Sister    Depression Sister    Stroke Other    Heart attack Other    Cancer Other    Dementia Other    Breast cancer Neg Hx    Thyroid  disease Neg Hx    Scheduled  Meds:  apixaban   5 mg Oral BID   divalproex   125 mg Oral BID   furosemide   20 mg Oral Daily  methimazole   2.5 mg Oral Daily   metoprolol  succinate  100 mg Oral BID   pantoprazole   40 mg Oral Daily   QUEtiapine   12.5 mg Oral QPC supper   timolol   1 drop Both Eyes Daily   venlafaxine   75 mg Oral BID WC   Continuous Infusions:  diltiazem  (CARDIZEM ) infusion 7.5 mg/hr (07/09/24 0632)   PRN Meds:.acetaminophen  **OR** acetaminophen , senna-docusate Medications Prior to Admission:  Prior to Admission medications   Medication Sig Start Date End Date Taking? Authorizing Provider  apixaban  (ELIQUIS ) 5 MG TABS tablet Take 1 tablet (5 mg total) by mouth 2 (two) times daily. 05/21/24  Yes Djan, Drue DASEN, MD  clorazepate  (TRANXENE ) 3.75 MG tablet TAKE 1 TABLET BY MOUTH 3 TIMES DAILY AS NEEDED FOR ANXIETY. 01/24/24  Yes Jimmy Charlie FERNS, MD  clotrimazole -betamethasone  (LOTRISONE ) cream Apply 1 Application topically daily. 06/02/24  Yes Webb, Padonda B, FNP  diphenhydrAMINE  (BENADRYL ) 25 MG tablet Take 25 mg by mouth every 6 (six) hours as needed.   Yes [provider]  divalproex  (DEPAKOTE ) 125 MG DR tablet Take 1 tablet (125 mg total) by mouth 2 (two) times daily. 07/03/24  Yes Marylynn Verneita CROME, MD  furosemide  (LASIX ) 20 MG tablet Take 1 tablet (20 mg total) by mouth daily. 06/25/24  Yes Vivienne Lonni Ingle, NP  ibuprofen (ADVIL,MOTRIN) 200 MG tablet Take 200 mg by mouth every 6 (six) hours as needed.   Yes [provider]  latanoprost  (XALATAN ) 0.005 % ophthalmic solution Place 1 drop into both eyes at bedtime. 01/15/12  Yes [provider]  meclizine  (ANTIVERT ) 25 MG tablet TAKE ONE TABLET DAILY AS NEEDED 12/12/22  Yes Jimmy Charlie FERNS, MD  methimazole  (TAPAZOLE ) 5 MG tablet Take 0.5 tablets (2.5 mg total) by mouth daily. 05/22/24  Yes Dorinda Drue DASEN, MD  metoprolol  succinate (TOPROL -XL) 100 MG 24 hr tablet Take 1 tablet (100 mg total) by mouth 2 (two) times daily. Take with  or immediately following a meal. 07/03/24  Yes Marylynn Verneita CROME, MD  mometasone  (ELOCON ) 0.1 % cream Apply 1 Application topically 2 (two) times daily as needed (itching). 12/12/22  Yes Jimmy Charlie FERNS, MD  nystatin  (MYCOSTATIN /NYSTOP ) powder Apply 1 Application topically 3 (three) times daily. 04/04/24  Yes [provider]  omeprazole  (PRILOSEC ) 20 MG capsule Take 1 capsule (20 mg total) by mouth daily. 07/03/24  Yes Tullo, Teresa L, MD  polyethylene glycol (MIRALAX  / GLYCOLAX ) 17 g packet Take 17 g by mouth daily as needed for mild constipation. 01/18/22  Yes Alexander, Natalie, DO  QUEtiapine  (SEROQUEL ) 25 MG tablet Take 0.5 tablets (12.5 mg total) by mouth daily after supper. 05/21/24  Yes Dorinda Drue DASEN, MD  timolol  (TIMOPTIC ) 0.5 % ophthalmic solution Place 1 drop into both eyes daily.   Yes [provider]  venlafaxine  (EFFEXOR ) 75 MG tablet Take 1 tablet (75 mg total) by mouth 2 (two) times daily. 07/03/24  Yes Marylynn Verneita CROME, MD  Calcium  Carbonate (CALCIUM  500 PO) Take one by mouth twice a day   07/17/19  [provider]   Allergies  Allergen Reactions   Doxycycline  Other (See Comments)    Hallucinations, nocturnal:  In the night felt like she was flipping in the bed.   Augmentin [Amoxicillin-Pot Clavulanate] Other (See Comments)    Pt cannot remember   Cephalexin     REACTION: vaginal blisters Tolerates penicillins   Codeine Phosphate     REACTION: vomiting   Promethazine Hcl  REACTION: nerves   Review of Systems  All other systems reviewed and are negative.   Physical Exam Pulmonary:     Effort: Pulmonary effort is normal.  Skin:    General: Skin is warm and dry.  Neurological:     Mental Status: She is alert.     Vital Signs: BP (!) 124/95 (BP Location: Right Arm)   Pulse 90   Temp 98.2 F (36.8 C) (Oral)   Resp 20   Ht 5' 3 (1.6 m)   Wt 59 kg   SpO2 100%   BMI 23.03 kg/m  Pain Scale: 0-10   Pain Score: Asleep   SpO2: SpO2:  100 % O2 Device:SpO2: 100 % O2 Flow Rate: .O2 Flow Rate (L/min): 2 L/min  IO: Intake/output summary:  Intake/Output Summary (Last 24 hours) at 07/09/2024 1203 Last data filed at 07/09/2024 9367 Gross per 24 hour  Intake 457.29 ml  Output --  Net 457.29 ml    LBM: Last BM Date : 07/07/24 Baseline Weight: Weight: 59 kg Most recent weight: Weight: 59 kg      Signed by: Camelia Lewis, NP   Please contact Palliative Medicine Team phone at [phone] for questions and concerns.  For individual provider: See Tracey

## 2024-07-09 NOTE — H&P (Addendum)
 History and Physical    ADIAH GUERECA FMW:[id number] DOB: 07-Mar-1946 DOA: 07/08/2024  DOS: the patient was seen and examined on 07/08/2024  PCP: Marylynn Verneita CROME, MD   Patient coming from: Home  I have personally briefly reviewed patient's old medical records in [facility] and CareEverywhere  HPI:   GELENE RECKTENWALD is a 78 y.o. year old female with past medical history of HTN, HLD, paroxsymal atrial fibrillation, hyperthyroidism, venous stasis, and mild cognitive impairment presenting to the ED from the clinic due to concern for altered mental status and hallucination. Appears PCP advised pt to seek care on 10/09 but pt refused along with ischemic changes noted on her feet.  Pt states she is feeling well and currently doesn't have any complaints. When talking about her feet, she states she doesn't know how long they have been like this but it is not painful and her feet stay cold all the time.   Pt states she sees visual hallucinations and they have been present for over a week. She denies any hx of psychiatric disorder.     Spoke with pt's sister. Pt noted to be declining since last hospital discharge. She has been having hallucination since last discharge and per the sister when she visited in September her feet were in very poor condition and very cold at that time.  States she thinks this is due to metoprolol  side effect.    Prior to hospitalization, pt was still dependant for ADLs and iADLs but it was exacerbated by her hospitalization. She has been declining per the sister and was recommended hospice by PCP but pt switched PCP.   ED Course: On arrival to the ED patient was noted to be HDS stable. CTH showed 1.2 cm meningioma. Multiple lab work including troponin was ordered and hospitalist was consulted for admission. Pt noted to be in afib with RVR and this was present on EKG.   Review of Systems: As mentioned in the history of present illness. All other systems reviewed and  are negative.   Past Medical History:  Diagnosis Date   Acute lateral meniscus tear of right knee    Anxiety disorder, unspecified    Atrial fibrillation with rapid ventricular response (HCC) 05/11/2024   Cataract [facility]   Clotting disorder blood circulation/veins in feet and lower legs   Depression 1978   Diverticulosis of colon 04/2005   GERD (gastroesophageal reflux disease)    Glaucoma [facility]   Hypertension    Hyperthyroidism    Dr Damian   Impaired fasting glucose    Low TSH level    chronic low, but euthyroid   Microscopic hematuria    Multiple thyroid  nodules    Persistent atrial fibrillation (HCC)    RBBB    S/p left hip fracture    Sepsis (HCC) 05/14/2024   Sleep disturbance    Subclinical hyperthyroidism    unspecified   Tibia fracture 05/2010   left    Past Surgical History:  Procedure Laterality Date   ABDOMINAL HYSTERECTOMY  1988   hysterectomy/ BSO for endometriosis   BREAST BIOPSY Right 1992   neg   BREAST EXCISIONAL BIOPSY Right 1988   benign per pt   INTRAMEDULLARY (IM) NAIL INTERTROCHANTERIC Left 01/15/2022   Procedure: INTRAMEDULLARY (IM) NAIL INTERTROCHANTRIC;  Surgeon: Cleotilde Barrio, MD;  Location: [facility] ORS;  Service: Orthopedics;  Laterality: Left;   KNEE ARTHROSCOPY  6/12   left--Dr Ernie     Allergies  Allergen  Reactions   Doxycycline  Other (See Comments)    Hallucinations, nocturnal:  In the night felt like she was flipping in the bed.   Augmentin [Amoxicillin-Pot Clavulanate] Other (See Comments)    Pt cannot remember   Cephalexin     REACTION: vaginal blisters Tolerates penicillins   Codeine Phosphate     REACTION: vomiting   Promethazine Hcl     REACTION: nerves    Family History  Problem Relation Age of Onset   Cancer Mother        lung   Diabetes Mother    Depression Mother    Depression Sister    Parkinson's disease Sister    Cancer Sister    Depression Sister    Stroke Other    Heart  attack Other    Cancer Other    Dementia Other    Breast cancer Neg Hx    Thyroid  disease Neg Hx     Prior to Admission medications   Medication Sig Start Date End Date Taking? Authorizing Provider  apixaban  (ELIQUIS ) 5 MG TABS tablet Take 1 tablet (5 mg total) by mouth 2 (two) times daily. 05/21/24   Dorinda Drue DASEN, MD  clorazepate  (TRANXENE ) 3.75 MG tablet TAKE 1 TABLET BY MOUTH 3 TIMES DAILY AS NEEDED FOR ANXIETY. 01/24/24   Jimmy Charlie FERNS, MD  clotrimazole -betamethasone  (LOTRISONE ) cream Apply 1 Application topically daily. 06/02/24   Webb, Padonda B, FNP  diphenhydrAMINE  (BENADRYL ) 25 MG tablet Take 25 mg by mouth every 6 (six) hours as needed.    [provider]  divalproex  (DEPAKOTE ) 125 MG DR tablet Take 1 tablet (125 mg total) by mouth 2 (two) times daily. 07/03/24   Marylynn Verneita CROME, MD  furosemide  (LASIX ) 20 MG tablet Take 1 tablet (20 mg total) by mouth daily. 06/25/24   Vivienne Lonni Ingle, NP  ibuprofen (ADVIL,MOTRIN) 200 MG tablet Take 200 mg by mouth every 6 (six) hours as needed.    [provider]  latanoprost  (XALATAN ) 0.005 % ophthalmic solution Place 1 drop into both eyes at bedtime. 01/15/12   [provider]  meclizine  (ANTIVERT ) 25 MG tablet TAKE ONE TABLET DAILY AS NEEDED 12/12/22   Jimmy Charlie FERNS, MD  methimazole  (TAPAZOLE ) 5 MG tablet Take 0.5 tablets (2.5 mg total) by mouth daily. 05/22/24   Dorinda Drue DASEN, MD  metoprolol  succinate (TOPROL -XL) 100 MG 24 hr tablet Take 1 tablet (100 mg total) by mouth 2 (two) times daily. Take with or immediately following a meal. 07/03/24   Marylynn Verneita CROME, MD  mometasone  (ELOCON ) 0.1 % cream Apply 1 Application topically 2 (two) times daily as needed (itching). 12/12/22   Jimmy Charlie FERNS, MD  nystatin  (MYCOSTATIN /NYSTOP ) powder Apply 1 Application topically 3 (three) times daily. 04/04/24   [provider]  omeprazole  (PRILOSEC ) 20 MG capsule Take 1 capsule (20 mg total) by mouth daily. 07/03/24    Tullo, Teresa L, MD  polyethylene glycol (MIRALAX  / GLYCOLAX ) 17 g packet Take 17 g by mouth daily as needed for mild constipation. 01/18/22   Alexander, Natalie, DO  QUEtiapine  (SEROQUEL ) 25 MG tablet Take 0.5 tablets (12.5 mg total) by mouth daily after supper. 05/21/24   Dorinda Drue DASEN, MD  timolol  (TIMOPTIC ) 0.5 % ophthalmic solution Place 1 drop into both eyes daily.    [provider]  venlafaxine  (EFFEXOR ) 75 MG tablet Take 1 tablet (75 mg total) by mouth 2 (two) times daily. 07/03/24   Marylynn Verneita CROME, MD  Calcium  Carbonate (CALCIUM   500 PO) Take one by mouth twice a day   07/17/19  [provider]      reports that she has never smoked. She has been exposed to tobacco smoke. She has never used smokeless tobacco. She reports current alcohol use. No history on file for drug use.  reports that she has never smoked. She has been exposed to tobacco smoke. She has never used smokeless tobacco. She reports current alcohol use. No history on file for drug use. Lives by herself Tobacco- Denies use. EtOH- Denies use.  Illicit drug use- denies use.  IADLs/ADLs- dependant in all ADLs, iADL.    Physical Exam: Vitals:   07/08/24 1725 07/08/24 1726 07/08/24 2130 07/08/24 2232  BP:  (!) 140/122 (!) 108/91   Pulse:  140 (!) 113   Resp:  19 (!) 24 (!) 22  Temp:  98.8 F (37.1 C)  97.9 F (36.6 C)  TempSrc:  Oral  Axillary  SpO2:  91% 94%   Weight: 59 kg     Height: 5' 3 (1.6 m)      Gen:NAD HENT: [place] in place RC:pmmzhlojmob irregular rate, good radial pulses, absent pedal pulses on palpation, cold feet Resp: CTAB Abd: No TTP, normal bowel sounds MSK: no asymmetry Skin: lower extremity with cold feet and bluish skin Neuro: alert and oriented x4. Able to answer all questions appropriately but goes on tangents.  Psych: anxious mood   Labs on Admission: I have personally reviewed following labs and imaging studies  CBC: Recent Labs  Lab 07/08/24 1733  WBC 10.7*   HGB 11.5*  HCT 37.1  MCV 103.3*  PLT 232   Basic Metabolic Panel: Recent Labs  Lab 07/08/24 1733  NA 141  K 3.6  CL 87*  CO2 39*  GLUCOSE 167*  BUN 19  CREATININE 0.38*  CALCIUM  9.3   GFR: Estimated Creatinine Clearance: 47.2 mL/min (A) (by C-G formula based on SCr of 0.38 mg/dL (L)). Liver Function Tests: Recent Labs  Lab 07/08/24 2100  AST 19  ALT 11  ALKPHOS 67  BILITOT 0.8  PROT 7.0  ALBUMIN 3.0*   No results for input(s): LIPASE, AMYLASE in the last 168 hours. No results for input(s): AMMONIA in the last 168 hours. Coagulation Profile: No results for input(s): INR, PROTIME in the last 168 hours. Cardiac Enzymes: Recent Labs  Lab 07/08/24 2100  TROPONINIHS 14   BNP (last 3 results) Recent Labs    05/15/24 0357 05/17/24 0628 07/08/24 1733  BNP 611.0* 227.3* 479.3*   HbA1C: No results for input(s): HGBA1C in the last 72 hours. CBG: No results for input(s): GLUCAP in the last 168 hours. Lipid Profile: No results for input(s): CHOL, HDL, LDLCALC, TRIG, CHOLHDL, LDLDIRECT in the last 72 hours. Thyroid  Function Tests: Recent Labs    07/08/24 2100  TSH 3.461   Anemia Panel: No results for input(s): VITAMINB12, FOLATE, FERRITIN, TIBC, IRON, RETICCTPCT in the last 72 hours. Urine analysis:    Component Value Date/Time   COLORURINE YELLOW (A) 05/10/2024 1247   APPEARANCEUR Turbid (A) 07/01/2024 0947   LABSPEC 1.030 05/10/2024 1247   PHURINE 6.0 05/10/2024 1247   GLUCOSEU Negative 07/01/2024 0947   GLUCOSEU Negative 10/18/2015 1236   HGBUR MODERATE (A) 05/10/2024 1247   BILIRUBINUR Comment (A) 07/01/2024 0947   KETONESUR NEGATIVE 05/10/2024 1247   PROTEINUR 2+ (A) 07/01/2024 0947   PROTEINUR 100 (A) 05/10/2024 1247   UROBILINOGEN 0.2 mg/dL (A) 98/76/7982 8763   NITRITE Positive (A) 07/01/2024 9052  NITRITE NEGATIVE 05/10/2024 1247   LEUKOCYTESUR 3+ (A) 07/01/2024 0947   LEUKOCYTESUR LARGE (A) 05/10/2024  1247    Radiological Exams on Admission: I have personally reviewed images CT Head Wo Contrast Result Date: 07/08/2024 EXAM: CT HEAD WITHOUT CONTRAST 07/08/2024 06:03:38 PM TECHNIQUE: CT of the head was performed without the administration of intravenous contrast. Automated exposure control, iterative reconstruction, and/or weight based adjustment of the mA/kV was utilized to reduce the radiation dose to as low as reasonably achievable. COMPARISON: Head CT 01/14/2022 and MRI 04/17/2013. CLINICAL HISTORY: Altered mental status. FINDINGS: BRAIN AND VENTRICLES: Within limitations of moderate motion artifact, no acute large territory infarct, intrarenal hemorrhage, midline shift, extra axial fluid collection, or hydrocephalus is identified. There is mild cerebral atrophy. Cerebral white matter hypodensities are grossly similar to the prior CT and are nonspecific but compatible with moderate chronic small vessel ischemic disease. An approximately 1.2 cm calcified extra axial mass over the right temporoparietal convexity is similar to the prior CT without evidence of associated brain edema. There is associated mild skull hyperostosis. ORBITS: No acute abnormality. SINUSES: No acute abnormality. SOFT TISSUES AND SKULL: No acute soft tissue abnormality. No skull fracture. IMPRESSION: 1. No evidence of an acute intracranial abnormality on this motion-degraded study. 2. 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 Electronically signed by: Dasie Hamburg MD 07/08/2024 06:22 PM EDT RP Workstation: [id number]    EKG: My personal interpretation of EKG shows: It showed atrial fibrillation with heart rate at 124.     Assessment/Plan Principal Problem:   Critical limb ischemia of both lower extremities (HCC)   Lower extremity ischemia: Appears to have ischemia of bilateral lower extremities that appears to be chronic in nature and sister confirms.  Patient is not having any pain.  No palpable pulses in feet or cold to  touch and bluish hue but patient states they have been like this but does not know how long.  Lactic acid was elevated at 2.9 and repeat was 2.8 which could be due to some ischemic changes but could have multiple other etiologies including her A-fib with RVR or dehydration.Giving IVF. Since it is down-trending, we can discontinue trending it. EDP discussed with vascular and heparin  can be held and they will see the patient.   A-fib with RVR: Patient noted to be in RVR on my evaluation with heart rates 130s and 140s.  EKG with heart rate of 124 noted earlier. Current rates are 115-120s which are much improved. Ordered IV metoprolol . Will give LR as well and place prn pushes for elevated rates.   Meningioma: Unsure if this is contributing to her hallucinations but noted on imaging. Ordered MRI. Will need neurology consult.   Chronic Problems: HTN HLD Hyperthyroidism Resume home meds  VTE prophylaxis:  Eliquis  Diet: NPO after midnight Code Status:  DNR/DNI(Do NOT Intubate) Telemetry:  Admission status: Inpatient, Progressive Patient is from: Home  Anticipated d/c is to: TBD  Anticipated d/c is in: TBD    Family Communication: Sister updated over the phone, sister requests daily updates  Consults called: Vascular surgery by EDP, Neurology    Severity of Illness: The appropriate patient status for this patient is INPATIENT. Inpatient status is judged to be reasonable and necessary in order to provide the required intensity of service to ensure the patient's safety. The patient's presenting symptoms, physical exam findings, and initial radiographic and laboratory data in the context of their chronic comorbidities is felt to place them at high risk for further clinical deterioration. Furthermore, it is  not anticipated that the patient will be medically stable for discharge from the hospital within 2 midnights of admission.   * I certify that at the point of admission it is my clinical judgment  that the patient will require inpatient hospital care spanning beyond 2 midnights from the point of admission due to high intensity of service, high risk for further deterioration and high frequency of surveillance required.DEWAINE Morene Bathe, MD [facility] DEL. [facility]

## 2024-07-09 NOTE — ED Notes (Signed)
 Fall bracelet placed at this time. Bed alarm on.

## 2024-07-09 NOTE — Progress Notes (Addendum)
 Interim progress note not for billing.  I have seen and examined the patient, reviewed the chart, and agree with the h and p unless stipulated otherwise.    Hx a-fib, chf, hyperthyroid, on 2 liters oxygen at home, debilitated and mostly bed/chair-bound, presenting with delirium. Concern for PAD but arterial dopplers 2 months ago normal and on my exam palpable peripheral pulses, think what we're seeing is patient's chronic venous stasis not PAD, but vascular surgery is consulted and we will see what they say. In terms of delirium this is likely multifactorial but family thinks may be 2/2 recent start of metoprolol  and that certainly may contribute. Regardless a-fib is uncontrolled currently with rates in the low 100s, will ask cardiology to consult. Bigger issue is failure to thrive and patient's significant declining health status. Sister lives out of state and is POA, spoke with her today and she is leaning toward transition to comfort care but wants to see how things go for the next day or two, would like the patient to participate in these discussions but is afraid her mental status has declined such that that will never be possible. Will consult palliative. Discussed meningiomas with Dr. Claudene of neurosurgery, he advised these appear to be incidental, nothing to do acutely.

## 2024-07-09 NOTE — H&P (Incomplete)
 History and Physical    Alexandria Sherman FMW:[id number] DOB: 1945-12-31 DOA: 07/08/2024  DOS: the patient was seen and examined on 07/08/2024  PCP: Marylynn Verneita CROME, MD   Patient coming from: Home  I have personally briefly reviewed patient's old medical records in [facility] and CareEverywhere  HPI:   Alexandria Sherman is a 78 y.o. year old female with past medical history of HTN, HLD, paroxsymal atrial fibrillation, hyperthyroidism, venous stasis, and mild cognitive impairment presenting to the ED from the clinic due to concern for altered mental status and hallucination. Appears PCP advised pt to seek care on 10/09 but pt refused along with ischemic changes noted on her feet.  Pt states she is feeling well and currently doesn't have any complaints. When talking about her feet, she states she doesn't know how long they have been like this but it is not painful and her feet stay cold all the time.   Pt states she sees visual hallucinations and they have been present for over a week. She denies any hx of psychiatric disorder.     Spoke with pt's sister. Pt noted to be declining since last hospital discharge. She has been having hallucination since last discharge and per the sister when she visited in September her feet were in very poor condition and very cold at that time.  States she thinks this is due to metoprolol  side effect.    Prior to hospitalization  ED Course: On arrival to the ED patient was noted to be HDS stable. CTH showed 1.2 cm meningioma. Multiple lab work including troponin was ordered and hospitalist was consulted for admission. Pt noted to be in afib with RVR and this was present on EKG.   Review of Systems: As mentioned in the history of present illness. All other systems reviewed and are negative.   Past Medical History:  Diagnosis Date  . Acute lateral meniscus tear of right knee   . Anxiety disorder, unspecified   . Atrial fibrillation with rapid  ventricular response (HCC) 05/11/2024  . Cataract [facility]  . Clotting disorder blood circulation/veins in feet and lower legs  . Depression 1978  . Diverticulosis of colon 04/2005  . GERD (gastroesophageal reflux disease)   . Glaucoma [facility]  . Hypertension   . Hyperthyroidism    Dr Damian  . Impaired fasting glucose   . Low TSH level    chronic low, but euthyroid  . Microscopic hematuria   . Multiple thyroid  nodules   . Persistent atrial fibrillation (HCC)   . RBBB   . S/p left hip fracture   . Sepsis (HCC) 05/14/2024  . Sleep disturbance   . Subclinical hyperthyroidism    unspecified  . Tibia fracture 05/2010   left    Past Surgical History:  Procedure Laterality Date  . ABDOMINAL HYSTERECTOMY  1988   hysterectomy/ BSO for endometriosis  . BREAST BIOPSY Right 1992   neg  . BREAST EXCISIONAL BIOPSY Right 1988   benign per pt  . INTRAMEDULLARY (IM) NAIL INTERTROCHANTERIC Left 01/15/2022   Procedure: INTRAMEDULLARY (IM) NAIL INTERTROCHANTRIC;  Surgeon: Cleotilde Barrio, MD;  Location: [facility] ORS;  Service: Orthopedics;  Laterality: Left;  . KNEE ARTHROSCOPY  6/12   left--Dr Ernie     Allergies  Allergen Reactions  . Doxycycline  Other (See Comments)    Hallucinations, nocturnal:  In the night felt like she was flipping in the bed.  . Augmentin [Amoxicillin-Pot Clavulanate] Other (See Comments)  Pt cannot remember  . Cephalexin     REACTION: vaginal blisters Tolerates penicillins  . Codeine Phosphate     REACTION: vomiting  . Promethazine Hcl     REACTION: nerves    Family History  Problem Relation Age of Onset  . Cancer Mother        lung  . Diabetes Mother   . Depression Mother   . Depression Sister   . Parkinson's disease Sister   . Cancer Sister   . Depression Sister   . Stroke Other   . Heart attack Other   . Cancer Other   . Dementia Other   . Breast cancer Neg Hx   . Thyroid  disease Neg Hx     Prior to Admission  medications   Medication Sig Start Date End Date Taking? Authorizing Provider  apixaban  (ELIQUIS ) 5 MG TABS tablet Take 1 tablet (5 mg total) by mouth 2 (two) times daily. 05/21/24   Dorinda Drue DASEN, MD  clorazepate  (TRANXENE ) 3.75 MG tablet TAKE 1 TABLET BY MOUTH 3 TIMES DAILY AS NEEDED FOR ANXIETY. 01/24/24   Jimmy Charlie FERNS, MD  clotrimazole -betamethasone  (LOTRISONE ) cream Apply 1 Application topically daily. 06/02/24   Webb, Padonda B, FNP  diphenhydrAMINE  (BENADRYL ) 25 MG tablet Take 25 mg by mouth every 6 (six) hours as needed.    [provider]  divalproex  (DEPAKOTE ) 125 MG DR tablet Take 1 tablet (125 mg total) by mouth 2 (two) times daily. 07/03/24   Marylynn Verneita CROME, MD  furosemide  (LASIX ) 20 MG tablet Take 1 tablet (20 mg total) by mouth daily. 06/25/24   Vivienne Lonni Ingle, NP  ibuprofen (ADVIL,MOTRIN) 200 MG tablet Take 200 mg by mouth every 6 (six) hours as needed.    [provider]  latanoprost  (XALATAN ) 0.005 % ophthalmic solution Place 1 drop into both eyes at bedtime. 01/15/12   [provider]  meclizine  (ANTIVERT ) 25 MG tablet TAKE ONE TABLET DAILY AS NEEDED 12/12/22   Jimmy Charlie FERNS, MD  methimazole  (TAPAZOLE ) 5 MG tablet Take 0.5 tablets (2.5 mg total) by mouth daily. 05/22/24   Dorinda Drue DASEN, MD  metoprolol  succinate (TOPROL -XL) 100 MG 24 hr tablet Take 1 tablet (100 mg total) by mouth 2 (two) times daily. Take with or immediately following a meal. 07/03/24   Marylynn Verneita CROME, MD  mometasone  (ELOCON ) 0.1 % cream Apply 1 Application topically 2 (two) times daily as needed (itching). 12/12/22   Jimmy Charlie FERNS, MD  nystatin  (MYCOSTATIN /NYSTOP ) powder Apply 1 Application topically 3 (three) times daily. 04/04/24   [provider]  omeprazole  (PRILOSEC ) 20 MG capsule Take 1 capsule (20 mg total) by mouth daily. 07/03/24   Tullo, Teresa L, MD  polyethylene glycol (MIRALAX  / GLYCOLAX ) 17 g packet Take 17 g by mouth daily as needed for mild  constipation. 01/18/22   Alexander, Natalie, DO  QUEtiapine  (SEROQUEL ) 25 MG tablet Take 0.5 tablets (12.5 mg total) by mouth daily after supper. 05/21/24   Dorinda Drue DASEN, MD  timolol  (TIMOPTIC ) 0.5 % ophthalmic solution Place 1 drop into both eyes daily.    [provider]  venlafaxine  (EFFEXOR ) 75 MG tablet Take 1 tablet (75 mg total) by mouth 2 (two) times daily. 07/03/24   Marylynn Verneita CROME, MD  Calcium  Carbonate (CALCIUM  500 PO) Take one by mouth twice a day   07/17/19  [provider]      reports that she has never smoked. She has been exposed to tobacco smoke. She  has never used smokeless tobacco. She reports current alcohol use. No history on file for drug use. Lives with Currently works at/ unemployed currently Tobacco- *** ppd x **** years since age/ Denies use. EtOH- *** shots/ week, *** beers/week. Last drink was ***. Denies use.  Illicit drug use- denies use.  IADLs/ADLs- can person independently at baseline    Physical Exam: Vitals:   07/08/24 1726 07/08/24 2130 07/08/24 2232 07/08/24 2348  BP: (!) 140/122 (!) 108/91  (!) 158/98  Pulse: 88 (!) 113    Resp: 19 (!) 24 (!) 22 (!) 26  Temp: 98.8 F (37.1 C)  97.9 F (36.6 C) 97.9 F (36.6 C)  TempSrc: Oral  Axillary Axillary  SpO2: 91% 94%  97%  Weight:      Height:         Gen: HENT: CV: Resp: Abd: MSK: Skin Neuro Psych:   Labs on Admission: I have personally reviewed following labs and imaging studies  CBC: Recent Labs  Lab 07/08/24 1733  WBC 10.7*  HGB 11.5*  HCT 37.1  MCV 103.3*  PLT 232   Basic Metabolic Panel: Recent Labs  Lab 07/08/24 1733  NA 141  K 3.6  CL 87*  CO2 39*  GLUCOSE 167*  BUN 19  CREATININE 0.38*  CALCIUM  9.3   GFR: Estimated Creatinine Clearance: 47.2 mL/min (A) (by C-G formula based on SCr of 0.38 mg/dL (L)). Liver Function Tests: Recent Labs  Lab 07/08/24 2100  AST 19  ALT 11  ALKPHOS 67  BILITOT 0.8  PROT 7.0  ALBUMIN 3.0*   No results  for input(s): LIPASE, AMYLASE in the last 168 hours. No results for input(s): AMMONIA in the last 168 hours. Coagulation Profile: No results for input(s): INR, PROTIME in the last 168 hours. Cardiac Enzymes: Recent Labs  Lab 07/08/24 2100  TROPONINIHS 14   BNP (last 3 results) Recent Labs    05/15/24 0357 05/17/24 0628 07/08/24 1733  BNP 611.0* 227.3* 479.3*   HbA1C: No results for input(s): HGBA1C in the last 72 hours. CBG: No results for input(s): GLUCAP in the last 168 hours. Lipid Profile: No results for input(s): CHOL, HDL, LDLCALC, TRIG, CHOLHDL, LDLDIRECT in the last 72 hours. Thyroid  Function Tests: Recent Labs    07/08/24 2100  TSH 3.461   Anemia Panel: No results for input(s): VITAMINB12, FOLATE, FERRITIN, TIBC, IRON, RETICCTPCT in the last 72 hours. Urine analysis:    Component Value Date/Time   COLORURINE YELLOW (A) 05/10/2024 1247   APPEARANCEUR Turbid (A) 07/01/2024 0947   LABSPEC 1.030 05/10/2024 1247   PHURINE 6.0 05/10/2024 1247   GLUCOSEU Negative 07/01/2024 0947   GLUCOSEU Negative 10/18/2015 1236   HGBUR MODERATE (A) 05/10/2024 1247   BILIRUBINUR Comment (A) 07/01/2024 0947   KETONESUR NEGATIVE 05/10/2024 1247   PROTEINUR 2+ (A) 07/01/2024 0947   PROTEINUR 100 (A) 05/10/2024 1247   UROBILINOGEN 0.2 mg/dL (A) 98/76/7982 8763   NITRITE Positive (A) 07/01/2024 0947   NITRITE NEGATIVE 05/10/2024 1247   LEUKOCYTESUR 3+ (A) 07/01/2024 0947   LEUKOCYTESUR LARGE (A) 05/10/2024 1247    Radiological Exams on Admission: I have personally reviewed images CT Head Wo Contrast Result Date: 07/08/2024 EXAM: CT HEAD WITHOUT CONTRAST 07/08/2024 06:03:38 PM TECHNIQUE: CT of the head was performed without the administration of intravenous contrast. Automated exposure control, iterative reconstruction, and/or weight based adjustment of the mA/kV was utilized to reduce the radiation dose to as low as reasonably achievable.  COMPARISON: Head CT 01/14/2022 and MRI  04/17/2013. CLINICAL HISTORY: Altered mental status. FINDINGS: BRAIN AND VENTRICLES: Within limitations of moderate motion artifact, no acute large territory infarct, intrarenal hemorrhage, midline shift, extra axial fluid collection, or hydrocephalus is identified. There is mild cerebral atrophy. Cerebral white matter hypodensities are grossly similar to the prior CT and are nonspecific but compatible with moderate chronic small vessel ischemic disease. An approximately 1.2 cm calcified extra axial mass over the right temporoparietal convexity is similar to the prior CT without evidence of associated brain edema. There is associated mild skull hyperostosis. ORBITS: No acute abnormality. SINUSES: No acute abnormality. SOFT TISSUES AND SKULL: No acute soft tissue abnormality. No skull fracture. IMPRESSION: 1. No evidence of an acute intracranial abnormality on this motion-degraded study. 2. 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 Electronically signed by: Dasie Hamburg MD 07/08/2024 06:22 PM EDT RP Workstation: [id number]    EKG: My personal interpretation of EKG shows: ***    Assessment/Plan Principal Problem:   Critical limb ischemia of both lower extremities (HCC)     VTE prophylaxis:  {Blank single:19197::Lovenox ,SQ Heparin ,IV heparin  gtts,Xarelto,Eliquis ,Coumadin,SCDs,***}  Diet: Code Status:  {Blank single:19197::Full Code,DNR with Intubation,DNR/DNI(Do NOT Intubate),Comfort Care,***} Telemetry:  Admission status: {Blank single:19197::Observation,Inpatient}, {Blank single:19197::Med-Surg,Telemetry bed,Progressive,Step Down Unit} Patient is from: ***  Anticipated d/c is to: ***  Anticipated d/c is in: *** days   Family Communication: ***   Consults called: ***    Severity of Illness: {Observation/Inpatient:21159}   Morene Bathe, MD Jolynn DEL. [facility]

## 2024-07-09 NOTE — Consult Note (Signed)
 Cardiology Consultation   Patient ID: Alexandria Sherman MRN: [id number]; DOB: [birth year]  Admit date: 07/08/2024 Date of Consult: 07/09/2024  PCP:  Alexandria Verneita CROME, Sherman   [facility] Providers Cardiologist:  Alexandria Cage, Sherman  Physician requesting consult: Dr. Kandis Reason for consult: Atrial fibrillation with RVR  Patient Profile: Alexandria Sherman is a 78 y.o. female with a hx of  Mild cognitive impairment, chronic lower extremity swelling, weakness, nonambulatory since hip fracture years ago, subclinical hyperthyroidism presenting to the emergency room with altered mental status, agitation, confusion with paranoia, pressure ulcers   History of Present Illness: [name] is a poor historian  She reports 3 weeks of confusion and hallucinations of people and animals.  Notes in the ER confirm 2 to 3 weeks of agitation, confusion with paranoia Pressure ulcers on both feet, difficulty ambulating On 2 Sherman nasal cannula oxygen at baseline Lactic acid 2.9 In the ER with worsening A-fib with RVR  Atrial fibrillation dating back to August 2025 in the setting of UTI, sepsis, weakness, hyperthyroidism At that time started on beta-blocker/, digoxin , Eliquis , rate control pursued in the setting of hyperthyroidism Last seen in clinic June 25, 2024, was taking metoprolol  succinate 100 twice daily though blood pressure 95/80 Continued on Lasix  20 daily and digoxin  held  Seen by primary care yesterday July 08, 2024 for delirium, failure to thrive, foot pain secondary to pressure ulcers both heels   Past Medical History:  Diagnosis Date   Acute lateral meniscus tear of right knee    Anxiety disorder, unspecified    Atrial fibrillation with rapid ventricular response (HCC) 05/11/2024   Cataract [facility]   Clotting disorder blood circulation/veins in feet and lower legs   Depression 1978   Diverticulosis of colon 04/2005   GERD (gastroesophageal reflux disease)     Glaucoma [facility]   Hypertension    Hyperthyroidism    Dr Alexandria Sherman   Impaired fasting glucose    Low TSH level    chronic low, but euthyroid   Microscopic hematuria    Multiple thyroid  nodules    Persistent atrial fibrillation (HCC)    RBBB    S/p left hip fracture    Sepsis (HCC) 05/14/2024   Sleep disturbance    Subclinical hyperthyroidism    unspecified   Tibia fracture 05/2010   left    Past Surgical History:  Procedure Laterality Date   ABDOMINAL HYSTERECTOMY  1988   hysterectomy/ BSO for endometriosis   BREAST BIOPSY Right 1992   neg   BREAST EXCISIONAL BIOPSY Right 1988   benign per pt   INTRAMEDULLARY (IM) NAIL INTERTROCHANTERIC Left 01/15/2022   Procedure: INTRAMEDULLARY (IM) NAIL INTERTROCHANTRIC;  Surgeon: Alexandria Sherman;  Location: [facility] ORS;  Service: Orthopedics;  Laterality: Left;   KNEE ARTHROSCOPY  6/12   left--Dr Alexandria Sherman Medications:  Prior to Admission medications   Medication Sig Start Date End Date Taking? Authorizing Provider  apixaban  (ELIQUIS ) 5 MG TABS tablet Take 1 tablet (5 mg total) by mouth 2 (two) times daily. 05/21/24  Yes Alexandria Sherman  clorazepate  (TRANXENE ) 3.75 MG tablet TAKE 1 TABLET BY MOUTH 3 TIMES DAILY AS NEEDED FOR ANXIETY. 01/24/24  Yes Alexandria Charlie FERNS, Sherman  clotrimazole -betamethasone  (LOTRISONE ) cream Apply 1 Application topically daily. 06/02/24  Yes Alexandria Sherman  diphenhydrAMINE  (BENADRYL ) 25 MG tablet Take 25 mg by mouth every 6 (six) hours as needed.   Yes Provider, Historical, Sherman  divalproex  (DEPAKOTE ) 125 MG DR tablet Take 1 tablet (125 mg total) by mouth 2 (two) times daily. 07/03/24  Yes Alexandria Verneita CROME, Sherman  furosemide  (LASIX ) 20 MG tablet Take 1 tablet (20 mg total) by mouth daily. 06/25/24  Yes Alexandria Sherman  ibuprofen (ADVIL,MOTRIN) 200 MG tablet Take 200 mg by mouth every 6 (six) hours as needed.   Yes Provider, Historical, Sherman  latanoprost  (XALATAN ) 0.005 % ophthalmic solution  Place 1 drop into both eyes at bedtime. 01/15/12  Yes Provider, Historical, Sherman  meclizine  (ANTIVERT ) 25 MG tablet TAKE ONE TABLET DAILY AS NEEDED 12/12/22  Yes Alexandria Charlie FERNS, Sherman  methimazole  (TAPAZOLE ) 5 MG tablet Take 0.5 tablets (2.5 mg total) by mouth daily. 05/22/24  Yes Alexandria Sherman  metoprolol  succinate (TOPROL -XL) 100 MG 24 hr tablet Take 1 tablet (100 mg total) by mouth 2 (two) times daily. Take with or immediately following a meal. 07/03/24  Yes Alexandria Verneita CROME, Sherman  mometasone  (ELOCON ) 0.1 % cream Apply 1 Application topically 2 (two) times daily as needed (itching). 12/12/22  Yes Alexandria Charlie FERNS, Sherman  nystatin  (MYCOSTATIN /NYSTOP ) powder Apply 1 Application topically 3 (three) times daily. 04/04/24  Yes Provider, Historical, Sherman  omeprazole  (PRILOSEC ) 20 MG capsule Take 1 capsule (20 mg total) by mouth daily. 07/03/24  Yes Alexandria Sherman  polyethylene glycol (MIRALAX  / GLYCOLAX ) 17 g packet Take 17 g by mouth daily as needed for mild constipation. 01/18/22  Yes Alexandria Sherman  QUEtiapine  (SEROQUEL ) 25 MG tablet Take 0.5 tablets (12.5 mg total) by mouth daily after supper. 05/21/24  Yes Alexandria Sherman  timolol  (TIMOPTIC ) 0.5 % ophthalmic solution Place 1 drop into both eyes daily.   Yes Provider, Historical, Sherman  venlafaxine  (EFFEXOR ) 75 MG tablet Take 1 tablet (75 mg total) by mouth 2 (two) times daily. 07/03/24  Yes Alexandria Sherman  Calcium  Carbonate (CALCIUM  500 PO) Take one by mouth twice a day   07/17/19  Provider, Historical, Sherman    Scheduled Meds:  apixaban   5 mg Oral BID   divalproex   125 mg Oral BID   furosemide   20 mg Oral Daily   methimazole   2.5 mg Oral Daily   metoprolol  succinate  100 mg Oral BID   pantoprazole   40 mg Oral Daily   QUEtiapine   12.5 mg Oral QPC supper   timolol   1 drop Both Eyes Daily   venlafaxine   75 mg Oral BID WC   Continuous Infusions:  diltiazem  (CARDIZEM ) infusion 7.5 mg/hr (07/09/24 9367)   PRN Meds: acetaminophen  **OR**  acetaminophen , senna-docusate  Allergies:    Allergies  Allergen Reactions   Doxycycline  Other (See Comments)    Hallucinations, nocturnal:  In the night felt like she was flipping in the bed.   Augmentin [Amoxicillin-Pot Clavulanate] Other (See Comments)    Pt cannot remember   Cephalexin     REACTION: vaginal blisters Tolerates penicillins   Codeine Phosphate     REACTION: vomiting   Promethazine Hcl     REACTION: nerves    Social History:   Social History   Socioeconomic History   Marital status: Divorced    Spouse name: Not on file   Number of children: 0   Years of education: 16   Highest education level: Bachelor's degree (e.g., BA, AB, BS)  Occupational History   Occupation: Teacher--retired  Tobacco Use   Smoking status: Never    Passive exposure: Past   Smokeless tobacco:  Never  Vaping Use   Vaping status: Never Used  Substance and Sexual Activity   Alcohol use: Yes    Comment: Occasional   Drug use: Not on file   Sexual activity: Not Currently  Other Topics Concern   Not on file  Social History Narrative   Has living will   Both sisters have health care POA--- forms in chart   Now has DNR   No tube feeds if cognitively unaware   Social Drivers of Health   Financial Resource Strain: Low Risk  (03/31/2024)   Overall Financial Resource Strain (CARDIA)    Difficulty of Paying Living Expenses: Not hard at all  Food Insecurity: No Food Insecurity (07/09/2024)   Hunger Vital Sign    Worried About Running Out of Food in the Last Year: Never true    Ran Out of Food in the Last Year: Never true  Recent Concern: Food Insecurity - Food Insecurity Present (05/10/2024)   Hunger Vital Sign    Worried About Running Out of Food in the Last Year: Sometimes true    Ran Out of Food in the Last Year: Sometimes true  Transportation Needs: No Transportation Needs (07/09/2024)   PRAPARE - Administrator, Civil Service (Medical): No    Lack of Transportation  (Non-Medical): No  Physical Activity: Inactive (03/31/2024)   Exercise Vital Sign    Days of Exercise per Week: 0 days    Minutes of Exercise per Session: 0 min  Stress: Stress Concern Present (03/31/2024)   [employer] of Occupational Health - Occupational Stress Questionnaire    Feeling of Stress: Rather much  Social Connections: Socially Isolated (07/09/2024)   Social Connection and Isolation Panel    Frequency of Communication with Friends and Family: More than three times a week    Frequency of Social Gatherings with Friends and Family: Once a week    Attends Religious Services: Never    Database administrator or Organizations: No    Attends Banker Meetings: Never    Marital Status: Divorced  Catering manager Violence: Patient Unable To Answer (05/27/2024)   Humiliation, Afraid, Rape, and Kick questionnaire    Fear of Current or Ex-Partner: Patient unable to answer    Emotionally Abused: Patient unable to answer    Physically Abused: Patient unable to answer    Sexually Abused: Patient unable to answer    Family History:    Family History  Problem Relation Age of Onset   Cancer Mother        lung   Diabetes Mother    Depression Mother    Depression Sister    Parkinson's disease Sister    Cancer Sister    Depression Sister    Stroke Other    Heart attack Other    Cancer Other    Dementia Other    Breast cancer Neg Hx    Thyroid  disease Neg Hx      ROS:  Please see the history of present illness.  Review of Systems  Constitutional: Negative.   HENT: Negative.    Respiratory: Negative.    Cardiovascular: Negative.   Gastrointestinal: Negative.   Musculoskeletal: Negative.        Heel pain  Neurological: Negative.   Psychiatric/Behavioral: Negative.    All other systems reviewed and are negative.   Physical Exam/Data: Vitals:   07/09/24 0500 07/09/24 0630 07/09/24 0710 07/09/24 0930  BP: 138/80 100/84  (!) 124/95  Pulse:  82  90  Resp: (!)  24 (!) 23  20  Temp:    98.2 F (36.8 C)  TempSrc:    Oral  SpO2: 94% 100% 100% 100%  Weight:      Height:        Intake/Output Summary (Last 24 hours) at 07/09/2024 1053 Last data filed at 07/09/2024 9367 Gross per 24 hour  Intake 457.29 ml  Output --  Net 457.29 ml      07/08/2024    5:25 PM 07/08/2024    4:23 PM 07/03/2024    8:30 AM  Last 3 Weights  Weight (lbs) 130 lb 135 lb 135 lb  Weight (kg) 58.968 kg 61.236 kg 61.236 kg     Body mass index is 23.03 kg/m.  General:  Well nourished, well developed, in no acute distress HEENT: normal Neck: no JVD Vascular: No carotid bruits; Distal pulses 2+ bilaterally Cardiac:  normal S1, S2; RRR; no murmur  Lungs:  clear to auscultation bilaterally, no wheezing, rhonchi or rales  Abd: soft, nontender, no hepatomegaly  Ext: no edema Musculoskeletal:  No deformities, BUE and BLE strength normal and equal Skin: warm and dry  Neuro:  CNs 2-12 intact, no focal abnormalities noted Psych:  Normal affect   EKG:  The EKG was personally reviewed and demonstrates: Atrial fibrillation ventricular rate 124 bpm right bundle branch block  Telemetry:  Telemetry was personally reviewed and demonstrates: Atrial fibrillation rate 90 bpm  Relevant CV Studies:   Laboratory Data: High Sensitivity Troponin:   Recent Labs  Lab 07/08/24 2100  TROPONINIHS 14     Chemistry Recent Labs  Lab 07/08/24 1733  NA 141  K 3.6  CL 87*  CO2 39*  GLUCOSE 167*  BUN 19  CREATININE 0.38*  CALCIUM  9.3  GFRNONAA >60  ANIONGAP 15    Recent Labs  Lab 07/08/24 2100  PROT 7.0  ALBUMIN 3.0*  AST 19  ALT 11  ALKPHOS 67  BILITOT 0.8   Lipids  Recent Labs  Lab 07/08/24 2101  CHOL 174  TRIG 154*  HDL 40*  LDLCALC 103*  CHOLHDL 4.4    Hematology Recent Labs  Lab 07/08/24 1733  WBC 10.7*  RBC 3.59*  HGB 11.5*  HCT 37.1  MCV 103.3*  MCH 32.0  MCHC 31.0  RDW 15.1  PLT 232   Thyroid   Recent Labs  Lab 07/08/24 2100  TSH 3.461     BNP Recent Labs  Lab 07/08/24 1733  BNP 479.3*    DDimer No results for input(s): DDIMER in the last 168 hours.  Radiology/Studies:  MR BRAIN WO CONTRAST Result Date: 07/09/2024 EXAM: MRI BRAIN WITHOUT CONTRAST 07/08/2024 11:20:39 PM TECHNIQUE: Multiplanar multisequence MRI of the head/brain was performed without the administration of intravenous contrast. Examination severely degraded by motion artifact. COMPARISON: Comparison made with prior CT from earlier the same day. CLINICAL HISTORY: Meningioma. Best obtainable images pt coached on motion. Pt did not cooperate ; Per Sherman: 78 y.o. female with mild cognitive impairment, chronic lymphedema who comes in with concerns for altered mental status. she has been more agitated and confused with paranoia.for 2 weeks. FINDINGS: BRAIN AND VENTRICLES: Examination severely degraded by motion artifact. Cerebral volume at the normal limits. Patchy T2 FLAIR hyperintensity involving the periventricular and deep white matter, consistent with chronic subvascular ischemic disease, moderate in nature. No evidence for acute or subacute infarct. No areas of chronic cortical infarction. No acute intracranial hemorrhage. No visible chronic intracranial blood products. 1.6 cm extra axial  mass overlying the right temporal convexity, consistent with a meningioma (series 10, image 13). Additional 1.7 cm en plaque left parafalcine meningioma at the skull vertex (series 10, 24). No associated edema or mass effect about these lesions. No midline shift. No hydrocephalus. The sella is unremarkable. Normal flow voids. ORBITS: No acute abnormality. SINUSES AND MASTOIDS: No acute abnormality. BONES AND SOFT TISSUES: Normal marrow signal. No acute soft tissue abnormality. IMPRESSION: 1. No acute intracranial abnormality. 2. 1.6 cm meningioma overlying the right temporal convexity, with additional 1.7 cm left parafalcine meningioma at the skull vertex. No associated edema or mass  effect. Who Electronically signed by: Morene Hoard Sherman 07/09/2024 12:49 AM EDT RP Workstation: [id number]   DG Chest Port 1 View Result Date: 07/09/2024 EXAM: 1 VIEW(S) XRAY OF THE CHEST 07/09/2024 12:20:00 AM COMPARISON: Portable chest x-ray 05/13/2024. CLINICAL HISTORY: Shortness of breath. Pt comes with confusion that started 2-3 weeks ago. Pt placed on new med and family not sure if that caused it. Pt also has ulcers on bottom of feet that might be infected. Pt is not diabetic. Pt states her feet has bad and she has had people step on her. FINDINGS: LUNGS AND PLEURA: The lungs again are noted to be expiratory. Small pleural effusions are again noted and may be chronic. A poor inspiration again is noted with consolidation or atelectasis in the hypoinflated bases. The mid and upper lung fields are generally clear. No pulmonary edema. No pneumothorax. A follow-up study is recommended in full inspiration, preferably PA and lateral views if possible. HEART AND MEDIASTINUM: The cardiac size is normal. The mediastinum is stable with aortic atherosclerosis and mild tortuosity. BONES AND SOFT TISSUES: Osteopenia. There is mild dextroscoliosis and multilevel degenerative change of the spine. 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 There is bilateral shoulder djd. IMPRESSION: 1. Expiratory film. 2. Consolidation or atelectasis in the hypoinflated bases. 3. Small pleural effusions, possibly chronic. 4. Recommend repeat chest radiographs in full inspiration, preferably PA and lateral views if possible. Electronically signed by: Francis Quam Sherman 07/09/2024 12:41 AM EDT RP Workstation: [id number]   CT Head Wo Contrast Result Date: 07/08/2024 EXAM: CT HEAD WITHOUT CONTRAST 07/08/2024 06:03:38 PM TECHNIQUE: CT of the head was performed without the administration of intravenous contrast. Automated exposure control, iterative reconstruction, and/or weight based adjustment of the mA/kV  was utilized to reduce the radiation dose to as low as reasonably achievable. COMPARISON: Head CT 01/14/2022 and MRI 04/17/2013. CLINICAL HISTORY: Altered mental status. FINDINGS: BRAIN AND VENTRICLES: Within limitations of moderate motion artifact, no acute large territory infarct, intrarenal hemorrhage, midline shift, extra axial fluid collection, or hydrocephalus is identified. There is mild cerebral atrophy. Cerebral white matter hypodensities are grossly similar to the prior CT and are nonspecific but compatible with moderate chronic small vessel ischemic disease. An approximately 1.2 cm calcified extra axial mass over the right temporoparietal convexity is similar to the prior CT without evidence of associated brain edema. There is associated mild skull hyperostosis. ORBITS: No acute abnormality. SINUSES: No acute abnormality. SOFT TISSUES AND SKULL: No acute soft tissue abnormality. No skull fracture. IMPRESSION: 1. No evidence of an acute intracranial abnormality on this motion-degraded study. 2. 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 Electronically signed by: Dasie Hamburg Sherman 07/08/2024 06:22 PM EDT RP Workstation: [id number]     Assessment and Plan: Atrial fibrillation with RVR Elevated rate in the emergency room in the setting of agitation/confusion, We received a message that family was concerned that metoprolol   was contributing to her delirium. She was started on diltiazem  infusion in addition to metoprolol  (she received dose of metoprolol  succinate 100 this morning) - She has been started on diltiazem  30 every 6 hours oral dosing.  Dosing can be titrated upwards as needed for rate control-Will increase up to 45 every 6 as blood pressure so far tolerating - Continue Eliquis  5 twice daily - Likely contributing to fluid retention, continue Lasix  20 daily - If family continues to express concern with the metoprolol , could wean off the metoprolol  tomorrow and increase diltiazem  dosing    Delirium Notes indicating symptoms for 2-3 weeks Brain MRI, no acute findings 1.6 cm meningioma overlying the right temporal convexity, with additional 1.7 cm left parafalcine meningioma at the skull vertex - Unable to exclude infection from her heels as a contributor - On initial discussions, patient declining surgical intervention on her heels  Hypothyroidism On methimazole  Followed by endocrine  Failure to thrive Unable to stand, heel sores, delirium Palliative following   For questions or updates, please contact [facility] Please consult [email or web address] for contact info under   Signed, Velinda Lunger, Sherman, Ph.D [facility]

## 2024-07-09 NOTE — ED Notes (Addendum)
 Dr. Lawence notified of pt's low BP

## 2024-07-10 ENCOUNTER — Inpatient Hospital Stay

## 2024-07-10 ENCOUNTER — Other Ambulatory Visit: Payer: Self-pay

## 2024-07-10 DIAGNOSIS — I70223 Atherosclerosis of native arteries of extremities with rest pain, bilateral legs: Secondary | ICD-10-CM | POA: Diagnosis not present

## 2024-07-10 DIAGNOSIS — A419 Sepsis, unspecified organism: Secondary | ICD-10-CM | POA: Diagnosis not present

## 2024-07-10 DIAGNOSIS — L03031 Cellulitis of right toe: Secondary | ICD-10-CM

## 2024-07-10 DIAGNOSIS — Z7189 Other specified counseling: Secondary | ICD-10-CM | POA: Diagnosis not present

## 2024-07-10 DIAGNOSIS — L97519 Non-pressure chronic ulcer of other part of right foot with unspecified severity: Secondary | ICD-10-CM

## 2024-07-10 DIAGNOSIS — L97529 Non-pressure chronic ulcer of other part of left foot with unspecified severity: Secondary | ICD-10-CM

## 2024-07-10 DIAGNOSIS — I4891 Unspecified atrial fibrillation: Secondary | ICD-10-CM | POA: Diagnosis not present

## 2024-07-10 DIAGNOSIS — R6521 Severe sepsis with septic shock: Secondary | ICD-10-CM | POA: Diagnosis not present

## 2024-07-10 DIAGNOSIS — I5032 Chronic diastolic (congestive) heart failure: Secondary | ICD-10-CM

## 2024-07-10 DIAGNOSIS — D329 Benign neoplasm of meninges, unspecified: Secondary | ICD-10-CM

## 2024-07-10 DIAGNOSIS — L03119 Cellulitis of unspecified part of limb: Secondary | ICD-10-CM

## 2024-07-10 DIAGNOSIS — R410 Disorientation, unspecified: Secondary | ICD-10-CM | POA: Diagnosis not present

## 2024-07-10 LAB — CBC WITH DIFFERENTIAL/PLATELET
Abs Immature Granulocytes: 0.05 K/uL (ref 0.00–0.07)
Basophils Absolute: 0 K/uL (ref 0.0–0.1)
Basophils Relative: 0 %
Eosinophils Absolute: 0.1 K/uL (ref 0.0–0.5)
Eosinophils Relative: 1 %
HCT: 33.7 % — ABNORMAL LOW (ref 36.0–46.0)
Hemoglobin: 10.2 g/dL — ABNORMAL LOW (ref 12.0–15.0)
Immature Granulocytes: 1 %
Lymphocytes Relative: 16 %
Lymphs Abs: 1.4 K/uL (ref 0.7–4.0)
MCH: 32.1 pg (ref 26.0–34.0)
MCHC: 30.3 g/dL (ref 30.0–36.0)
MCV: 106 fL — ABNORMAL HIGH (ref 80.0–100.0)
Monocytes Absolute: 0.8 K/uL (ref 0.1–1.0)
Monocytes Relative: 10 %
Neutro Abs: 6.1 K/uL (ref 1.7–7.7)
Neutrophils Relative %: 72 %
Platelets: 185 K/uL (ref 150–400)
RBC: 3.18 MIL/uL — ABNORMAL LOW (ref 3.87–5.11)
RDW: 15.2 % (ref 11.5–15.5)
WBC: 8.4 K/uL (ref 4.0–10.5)
nRBC: 0 % (ref 0.0–0.2)

## 2024-07-10 LAB — COMPREHENSIVE METABOLIC PANEL WITH GFR
ALT: 10 U/L (ref 0–44)
AST: 16 U/L (ref 15–41)
Albumin: 2.6 g/dL — ABNORMAL LOW (ref 3.5–5.0)
Alkaline Phosphatase: 56 U/L (ref 38–126)
Anion gap: 15 (ref 5–15)
BUN: 16 mg/dL (ref 8–23)
CO2: 36 mmol/L — ABNORMAL HIGH (ref 22–32)
Calcium: 8.3 mg/dL — ABNORMAL LOW (ref 8.9–10.3)
Chloride: 92 mmol/L — ABNORMAL LOW (ref 98–111)
Creatinine, Ser: <0.3 mg/dL — ABNORMAL LOW (ref 0.44–1.00)
Glucose, Bld: 105 mg/dL — ABNORMAL HIGH (ref 70–99)
Potassium: 3.1 mmol/L — ABNORMAL LOW (ref 3.5–5.1)
Sodium: 143 mmol/L (ref 135–145)
Total Bilirubin: 0.8 mg/dL (ref 0.0–1.2)
Total Protein: 5.9 g/dL — ABNORMAL LOW (ref 6.5–8.1)

## 2024-07-10 LAB — BASIC METABOLIC PANEL WITH GFR
Anion gap: 16 — ABNORMAL HIGH (ref 5–15)
BUN: 16 mg/dL (ref 8–23)
CO2: 32 mmol/L (ref 22–32)
Calcium: 8 mg/dL — ABNORMAL LOW (ref 8.9–10.3)
Chloride: 93 mmol/L — ABNORMAL LOW (ref 98–111)
Creatinine, Ser: 0.37 mg/dL — ABNORMAL LOW (ref 0.44–1.00)
GFR, Estimated: >60 mL/min (ref >60)
Glucose, Bld: 103 mg/dL — ABNORMAL HIGH (ref 70–99)
Potassium: 3.1 mmol/L — ABNORMAL LOW (ref 3.5–5.1)
Sodium: 141 mmol/L (ref 135–145)

## 2024-07-10 LAB — CBG MONITORING, ED: Glucose-Capillary: 99 mg/dL (ref 70–99)

## 2024-07-10 LAB — PROTIME-INR
INR: 1.7 — ABNORMAL HIGH (ref 0.8–1.2)
Prothrombin Time: 20.5 s — ABNORMAL HIGH (ref 11.4–15.2)

## 2024-07-10 LAB — LACTIC ACID, PLASMA
Lactic Acid, Venous: 0.7 mmol/L (ref 0.5–1.9)
Lactic Acid, Venous: 0.9 mmol/L (ref 0.5–1.9)
Lactic Acid, Venous: 2.4 mmol/L (ref 0.5–1.9)

## 2024-07-10 LAB — APTT: aPTT: 48 s — ABNORMAL HIGH (ref 24–36)

## 2024-07-10 MED ORDER — METOPROLOL TARTRATE 25 MG PO TABS
25.0000 mg | ORAL_TABLET | Freq: Two times a day (BID) | ORAL | Status: DC
  Administered 2024-07-10 – 2024-07-13 (×8): 25 mg via ORAL
  Filled 2024-07-10 (×9): qty 1

## 2024-07-10 MED ORDER — LATANOPROST 0.005 % OP SOLN
1.0000 [drp] | Freq: Every day | OPHTHALMIC | Status: DC
  Administered 2024-07-10 – 2024-07-13 (×4): 1 [drp] via OPHTHALMIC
  Filled 2024-07-10 (×2): qty 2.5

## 2024-07-10 MED ORDER — POTASSIUM CHLORIDE 10 MEQ/100ML IV SOLN
10.0000 meq | INTRAVENOUS | Status: AC
Start: 2024-07-10 — End: 2024-07-10
  Administered 2024-07-10 (×4): 10 meq via INTRAVENOUS
  Filled 2024-07-10 (×4): qty 100

## 2024-07-10 MED ORDER — HYDROXYZINE HCL 10 MG PO TABS
10.0000 mg | ORAL_TABLET | Freq: Three times a day (TID) | ORAL | Status: DC | PRN
  Administered 2024-07-11: 10 mg via ORAL
  Filled 2024-07-10 (×2): qty 1

## 2024-07-10 MED ORDER — LACTATED RINGERS IV SOLN
150.0000 mL/h | INTRAVENOUS | Status: DC
  Administered 2024-07-10: 150 mL/h via INTRAVENOUS

## 2024-07-10 MED ORDER — METRONIDAZOLE 500 MG/100ML IV SOLN: 500.0000 mg | Freq: Two times a day (BID) | INTRAVENOUS | Status: DC

## 2024-07-10 MED ORDER — SODIUM CHLORIDE 0.9 % IV SOLN: 2.0000 g | Freq: Once | INTRAVENOUS | Status: DC

## 2024-07-10 MED ORDER — HYALURONIDASE HUMAN 150 UNIT/ML IJ SOLN
150.0000 [IU] | Freq: Once | INTRAMUSCULAR | Status: AC
  Administered 2024-07-10: 150 [IU] via SUBCUTANEOUS
  Filled 2024-07-10: qty 1

## 2024-07-10 MED ORDER — DILTIAZEM HCL 60 MG PO TABS: 30.0000 mg | ORAL_TABLET | Freq: Four times a day (QID) | ORAL | Status: DC

## 2024-07-10 MED ORDER — SODIUM CHLORIDE 0.9 % IV SOLN
1.0000 g | Freq: Three times a day (TID) | INTRAVENOUS | Status: DC
Start: 2024-07-10 — End: 2024-07-13
  Administered 2024-07-10 – 2024-07-13 (×10): 1 g via INTRAVENOUS
  Filled 2024-07-10: qty 1
  Filled 2024-07-10 (×3): qty 5
  Filled 2024-07-10: qty 1
  Filled 2024-07-10: qty 5
  Filled 2024-07-10: qty 1
  Filled 2024-07-10 (×5): qty 5

## 2024-07-10 MED ORDER — VANCOMYCIN HCL 1250 MG/250ML IV SOLN
1250.0000 mg | Freq: Once | INTRAVENOUS | Status: AC
  Administered 2024-07-10: 1250 mg via INTRAVENOUS
  Filled 2024-07-10: qty 250

## 2024-07-10 MED ORDER — SODIUM CHLORIDE 0.9 % IV BOLUS
250.0000 mL | Freq: Once | INTRAVENOUS | Status: AC
  Administered 2024-07-10: 250 mL via INTRAVENOUS

## 2024-07-10 MED ORDER — VANCOMYCIN HCL IN DEXTROSE 1-5 GM/200ML-% IV SOLN
1000.0000 mg | INTRAVENOUS | Status: DC
  Administered 2024-07-11 – 2024-07-14 (×4): 1000 mg via INTRAVENOUS
  Filled 2024-07-10 (×5): qty 200

## 2024-07-10 MED ORDER — DIGOXIN 0.25 MG/ML IJ SOLN
0.2500 mg | Freq: Once | INTRAMUSCULAR | Status: AC
  Administered 2024-07-10: 0.25 mg via INTRAVENOUS
  Filled 2024-07-10: qty 2

## 2024-07-10 MED ORDER — HEPARIN (PORCINE) 25000 UT/250ML-% IV SOLN
650.0000 [IU]/h | INTRAVENOUS | Status: DC
  Administered 2024-07-10 (×2): 750 [IU]/h via INTRAVENOUS
  Administered 2024-07-11: 650 [IU]/h via INTRAVENOUS
  Filled 2024-07-10 (×2): qty 250

## 2024-07-10 MED ORDER — SODIUM CHLORIDE 0.9 % IV BOLUS: 500.0000 mL | Freq: Once | INTRAVENOUS | Status: DC

## 2024-07-10 MED ORDER — METRONIDAZOLE 500 MG/100ML IV SOLN
500.0000 mg | Freq: Two times a day (BID) | INTRAVENOUS | Status: DC
  Administered 2024-07-10 – 2024-07-14 (×8): 500 mg via INTRAVENOUS
  Filled 2024-07-10 (×10): qty 100

## 2024-07-10 MED ORDER — DIGOXIN 0.25 MG/ML IJ SOLN
0.2500 mg | Freq: Once | INTRAMUSCULAR | Status: DC
  Filled 2024-07-10: qty 2

## 2024-07-10 NOTE — Consult Note (Signed)
 PHARMACY - ANTICOAGULATION CONSULT NOTE  Pharmacy Consult for IV Heparin  Indication: atrial fibrillation and limb ischemia  Patient Measurements: Height: 5' 3 (160 cm) Weight: 59 kg (130 lb) IBW/kg (Calculated) : 52.4 HEPARIN  DW (KG): 59  Labs: Recent Labs    07/08/24 1733 07/08/24 2100 07/10/24 0119 07/10/24 0444  HGB 11.5*  --   --  10.2*  HCT 37.1  --   --  33.7*  PLT 232  --   --  185  APTT  --   --   --  48*  LABPROT  --   --   --  20.5*  INR  --   --   --  1.7*  CREATININE 0.38*  --  0.37* <0.30*  TROPONINIHS  --  14  --   --     CrCl cannot be calculated (This lab value cannot be used to calculate CrCl because it is not a number: <0.30).   Medical History: Past Medical History:  Diagnosis Date   Acute lateral meniscus tear of right knee    Anxiety disorder, unspecified    Atrial fibrillation with rapid ventricular response (HCC) 05/11/2024   Cataract [facility]   Clotting disorder blood circulation/veins in feet and lower legs   Depression 1978   Diverticulosis of colon 04/2005   GERD (gastroesophageal reflux disease)    Glaucoma [facility]   Hypertension    Hyperthyroidism    Dr Damian   Impaired fasting glucose    Low TSH level    chronic low, but euthyroid   Microscopic hematuria    Multiple thyroid  nodules    Persistent atrial fibrillation (HCC)    RBBB    S/p left hip fracture    Sepsis (HCC) 05/14/2024   Sleep disturbance    Subclinical hyperthyroidism    unspecified   Tibia fracture 05/2010   left    Medications:  Apixaban  5 mg BID on hold pending possible procedures  Assessment: Patient is a 78 y/o F with medical history as above and including Afib on Eliquis  who is admitted with limb ischemia complicated by bilateral gangrenous changes. Apixaban  has been placed on hold pending possible procedures. Pharmacy consulted to dose heparin .  Baseline INR 1.7 and aPTT 48s slightly elevated   Goal of Therapy:  Heparin   level 0.3-0.7 units/ml aPTT 66-102 seconds Monitor platelets by anticoagulation protocol: Yes   Plan:  --Start heparin  at 750 units/hr, no bolus --Check aPTT in 8 hours --Daily CBC and HL per protocol. Follow aPTT until correlating with HL  Marolyn KATHEE Mare 07/10/2024,10:16 AM

## 2024-07-10 NOTE — Evaluation (Signed)
 Clinical/Bedside Swallow Evaluation Patient Details  Name: Alexandria Sherman MRN: [id number] Date of Birth: [birth year]  Today's Date: 07/10/2024 Time: SLP Start Time (ACUTE ONLY): 1045 SLP Stop Time (ACUTE ONLY): 1115 SLP Time Calculation (min) (ACUTE ONLY): 30 min  Past Medical History:  Past Medical History:  Diagnosis Date   Acute lateral meniscus tear of right knee    Anxiety disorder, unspecified    Atrial fibrillation with rapid ventricular response (HCC) 05/11/2024   Cataract [facility]   Clotting disorder blood circulation/veins in feet and lower legs   Depression 1978   Diverticulosis of colon 04/2005   GERD (gastroesophageal reflux disease)    Glaucoma [facility]   Hypertension    Hyperthyroidism    Dr Damian   Impaired fasting glucose    Low TSH level    chronic low, but euthyroid   Microscopic hematuria    Multiple thyroid  nodules    Persistent atrial fibrillation (HCC)    RBBB    S/p left hip fracture    Sepsis (HCC) 05/14/2024   Sleep disturbance    Subclinical hyperthyroidism    unspecified   Tibia fracture 05/2010   left   Past Surgical History:  Past Surgical History:  Procedure Laterality Date   ABDOMINAL HYSTERECTOMY  1988   hysterectomy/ BSO for endometriosis   BREAST BIOPSY Right 1992   neg   BREAST EXCISIONAL BIOPSY Right 1988   benign per pt   INTRAMEDULLARY (IM) NAIL INTERTROCHANTERIC Left 01/15/2022   Procedure: INTRAMEDULLARY (IM) NAIL INTERTROCHANTRIC;  Surgeon: Cleotilde Barrio, MD;  Location: [facility] ORS;  Service: Orthopedics;  Laterality: Left;   KNEE ARTHROSCOPY  6/12   left--Dr Ernie   HPI:  Per H&P: Alexandria Sherman is a 78 y.o. year old female with past medical history of HTN, HLD, paroxsymal atrial fibrillation, hyperthyroidism, venous stasis, and mild cognitive impairment presenting to the ED from the clinic due to concern for altered mental status and hallucination. Appears PCP advised pt to seek care on 10/09 but pt  refused along with ischemic changes noted on her feet.     Pt states she is feeling well and currently doesn't have any complaints. When talking about her feet, she states she doesn't know how long they have been like this but it is not painful and her feet stay cold all the time.      Pt states she sees visual hallucinations and they have been present for over a week. She denies any hx of psychiatric disorder.        Spoke with pt's sister. Pt noted to be declining since last hospital discharge. She has been having hallucination since last discharge and per the sister when she visited in September her feet were in very poor condition and very cold at that time.  States she thinks this is due to metoprolol  side effect.       Prior to hospitalization, pt was still dependant for ADLs and iADLs but it was exacerbated by her hospitalization. She has been declining per the sister and was recommended hospice by PCP but pt switched PCP.   DG Chest 07/10/24:Low lung volumes with mild bibasilar atelectasis.  2. Possible small left pleural effusion.    Assessment / Plan / Recommendation  Clinical Impression  Pt seen for bedside swallow assessment in the setting of concern pt choking with medication administration. Per chart review, no hx of dysphagia/speech therapy services. Pt admitted with concerns for AMS- pt with diagnosis of MCI  at baseline. Nursing reporting increased anxiety and agitation requiring medication (Haldol). Per pt report, on oxygen at baseline- currently on 2L. O2 saturation maintained at ~95 for duration of session. Pt seen with trials of thin liquids (via straw), puree, and regular solids. No overt or subtle s/sx pharyngeal dysphagia noted. No change to vocal quality across trials. Vitals stable for duration of trials. Pt reporting discomfort with swallow for thin liquids- reporting that liquid does not go down or comes back up. Noted belching following completion of thin liquid trials, indicated  potential for esophageal component. Pt denied reflux sensation or globus with solids. Oral phase significantly prolonged, impacted by mentation/reduced attention to task of eating. Liquid wash and extended time provided to facilitate oral clearance.   Based on debility/acute deconditioning, AMS, hx of GERD, acute vs chronic respiratory concerns, pt is at increased risk of aspiration. Therefore recommend aspiration precautions (slow rate, small bites, elevated HOB, and alert for PO intake). Recommend chopped solids and thin liquids. Safety with PO intake will greatly depend on stability of mentation at that time, with potential impact of medication on alertness. Medications crushed in puree as able. Pt potentially to benefit from GI consult given reported discomfort with swallow. SLP will monitor for continued safety with current diet. MD and RN aware of recommendations.   SLP Visit Diagnosis: Dysphagia, unspecified (R13.10)    Aspiration Risk  Mild aspiration risk    Diet Recommendation   Dysphagia 2 (chopped);Thin  Medication Administration: Crushed with puree    Other  Recommendations Oral Care Recommendations: Oral care BID;Staff/trained caregiver to provide oral care;Oral care before and after PO     Assistance Recommended at Discharge    Functional Status Assessment Patient has had a recent decline in their functional status and demonstrates the ability to make significant improvements in function in a reasonable and predictable amount of time.  Frequency and Duration min 2x/week  2 weeks       Prognosis Prognosis for improved oropharyngeal function: Fair Barriers to Reach Goals: Cognitive deficits      Swallow Study   General Date of Onset: 07/10/24 HPI: Per H&P: Alexandria Sherman is a 78 y.o. year old female with past medical history of HTN, HLD, paroxsymal atrial fibrillation, hyperthyroidism, venous stasis, and mild cognitive impairment presenting to the ED from the clinic due to  concern for altered mental status and hallucination. Appears PCP advised pt to seek care on 10/09 but pt refused along with ischemic changes noted on her feet.     Pt states she is feeling well and currently doesn't have any complaints. When talking about her feet, she states she doesn't know how long they have been like this but it is not painful and her feet stay cold all the time.      Pt states she sees visual hallucinations and they have been present for over a week. She denies any hx of psychiatric disorder.        Spoke with pt's sister. Pt noted to be declining since last hospital discharge. She has been having hallucination since last discharge and per the sister when she visited in September her feet were in very poor condition and very cold at that time.  States she thinks this is due to metoprolol  side effect.       Prior to hospitalization, pt was still dependant for ADLs and iADLs but it was exacerbated by her hospitalization. She has been declining per the sister and was recommended hospice by  PCP but pt switched PCP.   DG Chest 07/10/24:Low lung volumes with mild bibasilar atelectasis.  2. Possible small left pleural effusion. Type of Study: Bedside Swallow Evaluation Previous Swallow Assessment: none in chart Diet Prior to this Study: NPO Temperature Spikes Noted: No Respiratory Status: Nasal cannula (2L) History of Recent Intubation: No Behavior/Cognition: Alert;Cooperative;Confused Oral Cavity Assessment: Within Functional Limits Oral Care Completed by SLP: Yes Oral Cavity - Dentition: Adequate natural dentition Vision: Functional for self-feeding Self-Feeding Abilities: Needs assist Patient Positioning: Upright in bed Baseline Vocal Quality: Normal Volitional Cough: Cognitively unable to elicit Volitional Swallow: Unable to elicit    Oral/Motor/Sensory Function Overall Oral Motor/Sensory Function: Within functional limits   Ice Chips Ice chips: Within functional  limits Presentation: Spoon   Thin Liquid Thin Liquid: Within functional limits Presentation: Spoon    Nectar Thick Nectar Thick Liquid: Not tested   Honey Thick Honey Thick Liquid: Not tested   Puree Puree: Within functional limits Presentation: Self Fed;Spoon   Solid     Solid: Impaired Presentation: Self Fed Oral Phase Impairments: Impaired mastication;Poor awareness of bolus Oral Phase Functional Implications: Impaired mastication;Oral residue;Prolonged oral transit     [place] Aadvika Konen Clapp, MS, CCC-SLP Speech Language Pathologist Rehab Services; [facility] [place] [phone] (ascom)   [place] J Clapp 07/10/2024,1:04 PM

## 2024-07-10 NOTE — ED Notes (Signed)
 Notified CCMD of transfer from ED 33 to ED 12 for telemetry.

## 2024-07-10 NOTE — ED Notes (Signed)
Pt given orange juice per pt request.

## 2024-07-10 NOTE — Progress Notes (Signed)
 Critical care note:  Date of note: 07/10/2024.  Subjective: The patient has been having persistent hypotension despite IV hydration with boluses of IV normal saline.  She has been having increased work of breathing and hypoxia with pulse oximetry of 88% requiring 2 L of O2 by nasal cannula.  She was noted to have bilateral heel ulcers with slightly erythematous feet and lower legs.  No chest pain or palpitations.  No nausea or vomiting or abdominal pain.  No headache or dizziness or blurred vision.  Objective: Physical examination: Generally: Acutely ill elderly Caucasian female in mild respiratory distress with conversational dyspnea.  She was alert and responding to questions appropriately. Vital signs: BP was 73/61 and later 83/46 with a MAP of 55 and heart rate was 78 and later 102, respiratory rate 19 and pulse oximetry as above.  Temperature was 97.8 rectally earlier. Head - atraumatic, normocephalic.  Pupils - equal, round and reactive to light and accommodation. Extraocular movements are intact. No scleral icterus.  Oropharynx - moist mucous membranes and tongue. No pharyngeal erythema or exudate.  Neck - supple. No JVD. Carotid pulses 2+ bilaterally. No carotid bruits. No palpable thyromegaly or lymphadenopathy. Cardiovascular - regular rate and rhythm. Normal S1 and S2. No murmurs, gallops or rubs.  Lungs -diminished bibasilar breath sounds with mild crackles. Abdomen - soft and nontender. Positive bowel sounds. No palpable organomegaly or masses.  Extremities - no pitting edema, clubbing or cyanosis.  Neuro - grossly non-focal. Skin -right big toe ulcer and erythema with swelling and bilateral heel ulcers with black eschar and surrounding erythema.     Breast, pelvic and rectal - deferred.   Labs and notes were reviewed.  Assessment/plan:  1.  Suspected septic shock could be secondary to bilateral heel and right big toe cellulitis. - The patient was started on broad-spectrum  IV antibiotic therapy with Azactam , vancomycin  and Flagyl. - The patient was given a bolus of IV normal saline and p.o. midodrine with stabilization of her blood pressure. - For MAP less than 65 that is persistent despite IV hydration she may need IV pressor therapy and transferred to ICU. - Will continue other plan of care.  Authorized and performed by: Madison Peaches, MD Total critical care time:    30    minutes. Due to a high probability of clinically significant, life-threatening deterioration, the patient required my highest level of preparedness to intervene emergently and I personally spent this critical care time directly and personally managing the patient.  This critical care time included obtaining a history, examining the patient, pulse oximetry, ordering and review of studies, arranging urgent treatment with development of management plan, evaluation of patient's response to treatment, frequent reassessment, and discussions with other providers. This critical care time was performed to assess and manage the high probability of imminent, life-threatening deterioration that could result in multiorgan failure.  It was exclusive of separately billable procedures and treating other patients and teaching time.

## 2024-07-10 NOTE — ED Notes (Signed)
 Pt moved from ER 33 to ER 12 due to continuously low BP's, despite IV fluids, midodrine, and verbal stimulation. Pt is afebrile. Dr. Lawence informed of all of this. Pt was very agitated earlier in the shift wanting to get out of the bed and yelling. Haldol IM given to pt. Pt's work of breathing seemed to become more increased. O2 saturation 100%RA. Lung sounds clear. Pt moved to main side of ER and report given to Izetta, RN for closer observation.

## 2024-07-10 NOTE — Patient Outreach (Signed)
 RNCM returned call to patient's HCPOA/Sister, Alexandria Sherman. Patient remains hospitalized, message from sister with questions about Palliative Care/Hospice. Sister asking why patient sent to ED for PVD - provided summary of documented issues upon arrival to ED and continued care required due to medical condition. Clarified again [facility] with [facility], not Palliative Care/Hospice. Sister stated she spoke with someone from [facility] but not set up because sister was not able to discuss with patient. Encouraged to call contact at [facility] and coordinate with hospital to arrange for services at discharge since wishes to keep sister at home. Also advised to speak with hospitalist to get information on sister's condition.   Conference call to scheduling to set up call between sister and Dr. Marylynn regarding continued questions about patient medications and why sent to ED by Dr Marylynn. Scheduled for 07/18/2024 at 5:00 pm.   Sister with no further questions/needs at this time and intends to arrange for Palliative/Hospice care at discharge.

## 2024-07-10 NOTE — Progress Notes (Signed)
 PROGRESS NOTE Alexandria Sherman    DOB: 1946/03/09, 78 y.o.  FMW:[id number]    Code Status: Limited: Do not attempt resuscitation (DNR) -DNR-LIMITED -Do Not Intubate/DNI    DOA: 07/08/2024   LOS: 1  Brief hospital course  Alexandria Sherman is a 78 y.o. female with a PMH significant for HTN, HLD, paroxsymal atrial fibrillation, hyperthyroidism, venous stasis, and mild cognitive impairment presenting to the ED from the clinic due to concern for altered mental status and hallucination. Prior to hospitalization, pt was still dependant for ADLs and iADLs but it was exacerbated by her hospitalization. She has been declining per the sister and was recommended hospice by PCP but pt switched PCP.    ED Course: VSS tachycardia with heart rate up to 130s and A-fib RVR, blood pressure stable, requiring 2 L oxygen which is baseline to maintain sats greater than 95%. Significant findings included LA 2.9>>>2.4.  WBC 10.7, hemoglobin 11.5, platelets 213.  Na+ 141, K+ 3.6, glucose 167, creatinine 0.38.  BNP 479.  Digoxin  less than 0.6 valproic acid  level 32.  UA positive for large hemoglobin, greater than 50 RBC, large leukocytes, no bacteria.  Limited 20 squamous epithelial cells.  TSH 3.461. ABG    Component Value Date/Time   PHART 7.3 (L) 05/13/2024 0006   PCO2ART 63 (H) 05/13/2024 0006   PO2ART 65 (L) 05/13/2024 0006   HCO3 45.9 (H) 07/08/2024 1945   O2SAT 94.2 05/13/2024 0006   CTH showed 1.2 cm meningioma. Pt noted to be in afib with RVR and this was present on EKG.   They were initially treated with Eliquis , diltiazem , metoprolol , midodrine, aztreonam , cefepime, heparin , LR, Flagyl, vancomycin .   Patient was admitted to medicine service for further workup and management of A-fib RVR, septic shock as outlined in detail below.  07/10/24 -overnight developed severe hypotension thought to be secondary to sepsis versus A-fib RVR requiring IV fluids, midodrine, rate control agents.  Cardiology was  consulted.  Assessment & Plan  Principal Problem:   Critical limb ischemia of both lower extremities (HCC) Active Problems:   Chronic diastolic CHF (congestive heart failure) (HCC)   Essential hypertension   Hyperthyroidism   PAD (peripheral artery disease)   Atrial fibrillation (HCC)   Confusion   Hyperlipidemia   Meningioma (HCC)   Ischemic ulcer of both feet (HCC)  Persistent Afib- limited treatment options given co-morbidities.  - cardiology consulted, appreciate recs  - start digoxin   - patient being put on heparin  gtt in event that she would potentially have debridement this hospitalization. She is not candidate for ablation at this time. On eliquis  at home - continue telemetry  - palliative consulted  Hypotension- this is likely multifactorial with medications side effects, RVR, and possible septic shock from LE cellulitis. LA remains elevated but improved to 2.4. s/p discontinuation of metoprolol  which family think is cause of her hallucinations  - IV fluids stopped for exacerbating poor cardiac output - continue midodrine.  - diltiazem  was discontinued - avoid pressors if possible given questionable blood flow to LE.   Lower extremity ischemia ruled out- LE arteries patent on imaging. No intervention required, per vascular surgery consult. Appreciate your recs  Lower extremity cellulitis vs infected wound from chronic venous stasis/pressure on heel. - continue IV Abx  - aztreonam , flagyl, vancomycin  - f/u cultures - WOC - if stabilizes from above problems, can consider podiatry consult if a candidate for debridement.  - pad heels  hyperthyroidism- TSH WNL but maybe still contributing to difficult  to control afib - continue home   Chronic hypoxic respiratory failure- endorses 2Lnc is her baseline - at baseline O2 requirement but had observed choking as suspected aspiration when taking morning pills - SLP consulted and NPO until evaluation   Acute delirium   Anxiety- possibly medication side effect. Brain MRI with findings below - hydroxyzine PRN - continue depakote , venlafaxine , seroquel    Meningioma: Unsure if this is contributing to her hallucinations but noted on imaging. Ordered MRI. Will need neurology consult.   Body mass index is 23.03 kg/m.  VTE ppx:  apixaban  (ELIQUIS ) tablet 5 mg   Diet:     Diet   DIET DYS 3 Fluid consistency: Thin   Consultants: Cardiology  Palliative   Subjective 07/10/24    Pt reports concern that she is not who she thought she was. Asks for confirmation that she is Arland Fuse. Denies nausea, palpitations, chest pain, SOB. No leg pain.   Objective  Blood pressure (!) 79/56, pulse 65, temperature 98 F (36.7 C), temperature source Axillary, resp. rate 17, height 5' 3 (1.6 m), weight 59 kg, SpO2 100%.  Intake/Output Summary (Last 24 hours) at 07/10/2024 0809 Last data filed at 07/10/2024 0541 Gross per 24 hour  Intake 100 ml  Output --  Net 100 ml   Filed Weights   07/08/24 1725  Weight: 59 kg    Physical Exam:  General: awake, alert, NAD HEENT: atraumatic, clear conjunctiva, anicteric sclera, MMM, hearing grossly normal Respiratory: normal respiratory effort. CTAB Cardiovascular: fast, irregular rate. Good cap refill.  Nervous: A&O x2. normal speech. Follows commands. Moving all limbs spontaneously  Extremities: erythematous LE- wounds per clinical images Skin: chronic venous stasis changes to LEs Psychiatry: normal mood, congruent affect       Labs   I have personally reviewed the following labs and imaging studies CBC    Component Value Date/Time   WBC 8.4 07/10/2024 0444   RBC 3.18 (L) 07/10/2024 0444   HGB 10.2 (L) 07/10/2024 0444   HCT 33.7 (L) 07/10/2024 0444   PLT 185 07/10/2024 0444   MCV 106.0 (H) 07/10/2024 0444   MCH 32.1 07/10/2024 0444   MCHC 30.3 07/10/2024 0444   RDW 15.2 07/10/2024 0444   LYMPHSABS 1.4 07/10/2024 0444   MONOABS 0.8 07/10/2024 0444    EOSABS 0.1 07/10/2024 0444   BASOSABS 0.0 07/10/2024 0444      Latest Ref Rng & Units 07/10/2024    4:44 AM 07/10/2024    1:19 AM 07/08/2024    5:33 PM  BMP  Glucose 70 - 99 mg/dL 894  896  832   BUN 8 - 23 mg/dL 16  16  19    Creatinine 0.44 - 1.00 mg/dL <9.69  9.62  9.61   Sodium 135 - 145 mmol/L 143  141  141   Potassium 3.5 - 5.1 mmol/L 3.1  3.1  3.6   Chloride 98 - 111 mmol/L 92  93  87   CO2 22 - 32 mmol/L 36  32  39   Calcium  8.9 - 10.3 mg/dL 8.3  8.0  9.3     DG Chest 1 View Result Date: 07/10/2024 EXAM: 1 VIEW(S) XRAY OF THE CHEST 07/10/2024 03:52:00 AM COMPARISON: 07/09/2024 CLINICAL HISTORY: Dyspnea SOB FINDINGS: LUNGS AND PLEURA: Low lung volumes with vascular crowding. Mildly patchy bibasilar opacities, likely atelectasis. Possible small left pleural effusion. No pneumothorax. HEART AND MEDIASTINUM: No acute abnormality of the cardiac and mediastinal silhouettes. BONES AND SOFT TISSUES: Old left lateral  clavicle fracture deformity. No acute osseous abnormality. IMPRESSION: 1. Low lung volumes with mild bibasilar atelectasis. 2. Possible small left pleural effusion. Electronically signed by: Pinkie Pebbles MD 07/10/2024 03:56 AM EDT RP Workstation: [id number]   MR BRAIN WO CONTRAST Result Date: 07/09/2024 EXAM: MRI BRAIN WITHOUT CONTRAST 07/08/2024 11:20:39 PM TECHNIQUE: Multiplanar multisequence MRI of the head/brain was performed without the administration of intravenous contrast. Examination severely degraded by motion artifact. COMPARISON: Comparison made with prior CT from earlier the same day. CLINICAL HISTORY: Meningioma. Best obtainable images pt coached on motion. Pt did not cooperate ; Per MD: 78 y.o. female with mild cognitive impairment, chronic lymphedema who comes in with concerns for altered mental status. she has been more agitated and confused with paranoia.for 2 weeks. FINDINGS: BRAIN AND VENTRICLES: Examination severely degraded by motion artifact. Cerebral  volume at the normal limits. Patchy T2 FLAIR hyperintensity involving the periventricular and deep white matter, consistent with chronic subvascular ischemic disease, moderate in nature. No evidence for acute or subacute infarct. No areas of chronic cortical infarction. No acute intracranial hemorrhage. No visible chronic intracranial blood products. 1.6 cm extra axial mass overlying the right temporal convexity, consistent with a meningioma (series 10, image 13). Additional 1.7 cm en plaque left parafalcine meningioma at the skull vertex (series 10, 24). No associated edema or mass effect about these lesions. No midline shift. No hydrocephalus. The sella is unremarkable. Normal flow voids. ORBITS: No acute abnormality. SINUSES AND MASTOIDS: No acute abnormality. BONES AND SOFT TISSUES: Normal marrow signal. No acute soft tissue abnormality. IMPRESSION: 1. No acute intracranial abnormality. 2. 1.6 cm meningioma overlying the right temporal convexity, with additional 1.7 cm left parafalcine meningioma at the skull vertex. No associated edema or mass effect. Who Electronically signed by: Morene Hoard MD 07/09/2024 12:49 AM EDT RP Workstation: [id number]   DG Chest Port 1 View Result Date: 07/09/2024 EXAM: 1 VIEW(S) XRAY OF THE CHEST 07/09/2024 12:20:00 AM COMPARISON: Portable chest x-ray 05/13/2024. CLINICAL HISTORY: Shortness of breath. Pt comes with confusion that started 2-3 weeks ago. Pt placed on new med and family not sure if that caused it. Pt also has ulcers on bottom of feet that might be infected. Pt is not diabetic. Pt states her feet has bad and she has had people step on her. FINDINGS: LUNGS AND PLEURA: The lungs again are noted to be expiratory. Small pleural effusions are again noted and may be chronic. A poor inspiration again is noted with consolidation or atelectasis in the hypoinflated bases. The mid and upper lung fields are generally clear. No pulmonary edema. No pneumothorax. A  follow-up study is recommended in full inspiration, preferably PA and lateral views if possible. HEART AND MEDIASTINUM: The cardiac size is normal. The mediastinum is stable with aortic atherosclerosis and mild tortuosity. BONES AND SOFT TISSUES: Osteopenia. There is mild dextroscoliosis and multilevel degenerative change of the spine. 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 There is bilateral shoulder djd. IMPRESSION: 1. Expiratory film. 2. Consolidation or atelectasis in the hypoinflated bases. 3. Small pleural effusions, possibly chronic. 4. Recommend repeat chest radiographs in full inspiration, preferably PA and lateral views if possible. Electronically signed by: Francis Quam MD 07/09/2024 12:41 AM EDT RP Workstation: [id number]   CT Head Wo Contrast Result Date: 07/08/2024 EXAM: CT HEAD WITHOUT CONTRAST 07/08/2024 06:03:38 PM TECHNIQUE: CT of the head was performed without the administration of intravenous contrast. Automated exposure control, iterative reconstruction, and/or weight based adjustment of the mA/kV was utilized  to reduce the radiation dose to as low as reasonably achievable. COMPARISON: Head CT 01/14/2022 and MRI 04/17/2013. CLINICAL HISTORY: Altered mental status. FINDINGS: BRAIN AND VENTRICLES: Within limitations of moderate motion artifact, no acute large territory infarct, intrarenal hemorrhage, midline shift, extra axial fluid collection, or hydrocephalus is identified. There is mild cerebral atrophy. Cerebral white matter hypodensities are grossly similar to the prior CT and are nonspecific but compatible with moderate chronic small vessel ischemic disease. An approximately 1.2 cm calcified extra axial mass over the right temporoparietal convexity is similar to the prior CT without evidence of associated brain edema. There is associated mild skull hyperostosis. ORBITS: No acute abnormality. SINUSES: No acute abnormality. SOFT TISSUES AND SKULL: No acute  soft tissue abnormality. No skull fracture. IMPRESSION: 1. No evidence of an acute intracranial abnormality on this motion-degraded study. 2. 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 Electronically signed by: Dasie Hamburg MD 07/08/2024 06:22 PM EDT RP Workstation: [id number]    Disposition Plan & Communication  Patient status: Inpatient  Admitted From: Home Planned disposition location: TBD Anticipated discharge date: TBD pending clinical improvement  Family Communication: none at bedside    Author: Marien LITTIE Piety, DO [facility] 07/10/2024, 8:09 AM   Available by Epic secure chat 7AM-7PM. If 7PM-7AM, please contact night-coverage.  [facility] contact information found on [email or web address].

## 2024-07-10 NOTE — Progress Notes (Signed)
 Pharmacy Antibiotic Note  Alexandria Sherman is a 78 y.o. female admitted on 07/08/2024 with infection of unknown source.  Pharmacy has been consulted for Vancomycin  dosing.  Plan: Pt given Vancomycin  1250 mg once. Vancomycin  1000 mg IV Q 24 hrs. Goal AUC 400-550. Expected AUC: 540.5 SCr used: 0.8 (10/16 SCr = 0.37)  Follow up culture results to assess for antibiotic optimization. Monitor renal function to assess for any necessary antibiotic dosing changes. Pharmacy will continue to follow and will adjust abx dosing whenever warranted.  Temp (24hrs), Avg:97.9 F (36.6 C), Min:97.3 F (36.3 C), Max:98.3 F (36.8 C)   Recent Labs  Lab 07/08/24 1733 07/08/24 2100 07/08/24 2101 07/09/24 0320 07/10/24 0119  WBC 10.7*  --   --   --   --   CREATININE 0.38*  --   --   --  0.37*  LATICACIDVEN 2.9* 2.8* 2.1* 2.4* 0.7    Estimated Creatinine Clearance: 47.2 mL/min (A) (by C-G formula based on SCr of 0.37 mg/dL (L)).    Allergies  Allergen Reactions   Doxycycline  Other (See Comments)    Hallucinations, nocturnal:  In the night felt like she was flipping in the bed.   Augmentin [Amoxicillin-Pot Clavulanate] Other (See Comments)    Pt cannot remember   Cephalexin     REACTION: vaginal blisters Tolerates penicillins   Codeine Phosphate     REACTION: vomiting   Promethazine Hcl     REACTION: nerves    Antimicrobials this admission: 10/16 Aztreonam  >>  10/16 Flagyl >> x 7 days 10/16 Vancomycin  >> x 7 days  Microbiology results: 10/16 BCx: Pending  Thank you for allowing pharmacy to be a part of this patient's care.  Rankin CANDIE Dills, PharmD, [facility] 07/10/2024 3:43 AM

## 2024-07-10 NOTE — ED Notes (Signed)
 Secure chatted Dr. Lawence: please come look at this pt again. BP is 75/57. I see no abx were given before. need help. manual bp is 75/57  come over and lets figure out a game plan please

## 2024-07-10 NOTE — Progress Notes (Signed)
 Progress Note  Patient Name: Alexandria Sherman Date of Encounter: 07/10/2024  Primary Cardiologist: Darron  Subjective   Denies chest pain or dyspnea.  Remains in A-fib with ventricular rates in the low 100s to 140s bpm.  Short acting diltiazem , had to be reduced from 45 mg to 30 mg with hypotension, requiring midodrine.  Potassium low at 3.1.  Albumin low at 2.6.  Inpatient Medications    Scheduled Meds:  apixaban   5 mg Oral BID   digoxin   0.25 mg Intravenous Once   divalproex   125 mg Oral BID   methimazole   2.5 mg Oral Daily   midodrine  10 mg Oral TID WC   pantoprazole   40 mg Oral Daily   QUEtiapine   12.5 mg Oral QPC supper   timolol   1 drop Both Eyes Daily   venlafaxine   75 mg Oral BID WC   Continuous Infusions:  aztreonam  Stopped (07/10/24 0843)   lactated ringers  Stopped (07/10/24 0846)   metronidazole Stopped (07/10/24 0541)   potassium chloride      [START ON 07/11/2024] vancomycin      PRN Meds: acetaminophen  **OR** acetaminophen , haloperidol lactate   Vital Signs    Vitals:   07/10/24 0700 07/10/24 0730 07/10/24 0802 07/10/24 0830  BP: 94/62 (!) 79/56  98/67  Pulse: 68 65  65  Resp: 19 17  (!) 21  Temp:   98 F (36.7 C)   TempSrc:   Axillary   SpO2: 100% 100%  99%  Weight:      Height:        Intake/Output Summary (Last 24 hours) at 07/10/2024 0945 Last data filed at 07/10/2024 0846 Gross per 24 hour  Intake 741.94 ml  Output --  Net 741.94 ml   Filed Weights   07/08/24 1725  Weight: 59 kg    Telemetry    A-fib with ventricular rates in the low 100s to 140s bpm - Personally Reviewed  ECG    No new tracings - Personally Reviewed  Physical Exam   GEN: Elderly, frail, and chronically ill-appearing.   Neck: No JVD. Cardiac: Tachycardic and irregularly irregular, no murmurs, rubs, or gallops.  Respiratory: Clear to auscultation bilaterally.  GI: Soft, nontender, non-distended.   MS: No edema; No deformity. Neuro:  Alert, intermittently  confused; Nonfocal.  Psych: Normal affect.  Labs    Chemistry Recent Labs  Lab 07/08/24 1733 07/08/24 2100 07/10/24 0119 07/10/24 0444  NA 141  --  141 143  K 3.6  --  3.1* 3.1*  CL 87*  --  93* 92*  CO2 39*  --  32 36*  GLUCOSE 167*  --  103* 105*  BUN 19  --  16 16  CREATININE 0.38*  --  0.37* <0.30*  CALCIUM  9.3  --  8.0* 8.3*  PROT  --  7.0  --  5.9*  ALBUMIN  --  3.0*  --  2.6*  AST  --  19  --  16  ALT  --  11  --  10  ALKPHOS  --  67  --  56  BILITOT  --  0.8  --  0.8  GFRNONAA >60  --  >60 NOT CALCULATED  ANIONGAP 15  --  16* 15     Hematology Recent Labs  Lab 07/08/24 1733 07/10/24 0444  WBC 10.7* 8.4  RBC 3.59* 3.18*  HGB 11.5* 10.2*  HCT 37.1 33.7*  MCV 103.3* 106.0*  MCH 32.0 32.1  MCHC 31.0 30.3  RDW  15.1 15.2  PLT 232 185    Cardiac EnzymesNo results for input(s): TROPONINI in the last 168 hours. No results for input(s): TROPIPOC in the last 168 hours.   BNP Recent Labs  Lab 07/08/24 1733  BNP 479.3*     DDimer No results for input(s): DDIMER in the last 168 hours.   Radiology    DG Chest 1 View Result Date: 07/10/2024 IMPRESSION: 1. Low lung volumes with mild bibasilar atelectasis. 2. Possible small left pleural effusion. Electronically signed by: Pinkie Pebbles MD 07/10/2024 03:56 AM EDT RP Workstation: [id number]   MR BRAIN WO CONTRAST Result Date: 07/09/2024 IMPRESSION: 1. No acute intracranial abnormality. 2. 1.6 cm meningioma overlying the right temporal convexity, with additional 1.7 cm left parafalcine meningioma at the skull vertex. No associated edema or mass effect. Who Electronically signed by: Morene Hoard MD 07/09/2024 12:49 AM EDT RP Workstation: [id number]   DG Chest Port 1 View Result Date: 07/09/2024 IMPRESSION: 1. Expiratory film. 2. Consolidation or atelectasis in the hypoinflated bases. 3. Small pleural effusions, possibly chronic. 4. Recommend repeat chest radiographs in full inspiration,  preferably PA and lateral views if possible. Electronically signed by: Francis Quam MD 07/09/2024 12:41 AM EDT RP Workstation: [id number]   CT Head Wo Contrast Result Date: 07/08/2024 IMPRESSION: 1. No evidence of an acute intracranial abnormality on this motion-degraded study. 2. 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 Electronically signed by: Dasie Hamburg MD 07/08/2024 06:22 PM EDT RP Workstation: [id number]    Cardiac Studies   2D echo 05/11/2024:  1. Left ventricular ejection fraction, by estimation, is 50 to 55%. The  left ventricle has low normal function. The left ventricle has no regional  wall motion abnormalities. Left ventricular diastolic parameters are  indeterminate.   2. Right ventricular systolic function is normal. The right ventricular  size is normal.   3. The mitral valve is normal in structure. Mild mitral valve  regurgitation.   4. The aortic valve is tricuspid. Aortic valve regurgitation is not  visualized.   5. The inferior vena cava is normal in size with greater than 50%  respiratory variability, suggesting right atrial pressure of 3 mmHg.   Patient Profile     78 y.o. female with history of coronary artery calcifications on CT imaging, recently diagnosed A-fib, chronic sinus tachycardia, hypertension, hyperlipidemia, chronic venous insufficiency/lymphedema, anxiety, depression, GERD, and hyperthyroidism (diagnosed August 2025) who was admitted with encephalopathy secondary to sepsis secondary to calcaneal pressure ulcers and we are seeing for A-fib with RVR.  Assessment & Plan    1. Persistent Afib with RVR: -Elevated ventricular rates in the setting of acute infection with associated sepsis and relative hypotension  -Overall, rate control options are limited -Amiodarone is not ideal with underlying hyperthyroidism (TSH normal 10/14), hypotension precludes escalation of AV nodal blockers (note indicates patient's family may have previously expressed  concerns regarding metoprolol ) -Diltiazem  was stopped due to hypotension -May need to add digoxin , though needs potassium repletion prior  -Will discuss with MD -PTA Eliquis  5 mg bid, does not meet reduced dosing criteria  -Stop IV fluids  2. Hypotension: -Requiring midodrine -Suspect elevated BP currently is in the setting of supine hypertension associated with midodrine  -Needs to sit up with midodrine   3. Hypokalemia: -Replete to goal 4.0  4. Hyperthyroidism: -TSH normal -Remains on methimazole       For questions or updates, please contact [facility] Please consult [email or web address] for contact info under Cardiology/STEMI.    Signed, Bernardino Bring, PA-C [facility] Pager: [phone] 07/10/2024, 9:45 AM

## 2024-07-10 NOTE — Progress Notes (Addendum)
 Daily Progress Note   Patient Name: Alexandria Sherman       Date: 07/10/2024 DOB: 22-Feb-1946  Age: 78 y.o. MRN#: [id number] Attending Physician: Lenon Marien CROME, MD Primary Care Physician: Marylynn Verneita CROME, MD Admit Date: 07/08/2024  Reason for Consultation/Follow-up: Establishing goals of care  Subjective: Notes and labs reviewed.  In to see patient.  She is currently sitting in bed at this time and appears anxious.  She perseverates on wanting to know how the hospital would know she is actually who she says she is, and not someone else pretending to be her. She is confused and does not really answer questions, but returns to discussing her identity.  Case discussed with attending.  Spoke with patient's sister [name].  [name] is listed as H POA and her advanced directives listed under ACP tab.  Sister says she lives in [place]  but was in [place]  in September, and her sister was with it in September and when she spoke with her, and was also cognitively intact when she spoke with her over the weekend.  She states that the 3 women who stay with her to help provide her care have discussed her being more confused at night here recently.   Sister does state that when patient had a UTI previously she developed transient confusion with that infection.  We discussed her lower extremities and current antibiotic therapy to treat for concerns with infection.  She states someone has spoken with her about hospice care.  We discussed patient's advanced directives.  Continue DNR and DNI status.  Time for outcomes and continue to treat the treatable.   Length of Stay: 1  Current Medications: Scheduled Meds:   divalproex   125 mg Oral BID   hyaluronidase Human  150 Units Subcutaneous Once    latanoprost   1 drop Both Eyes QHS   methimazole   2.5 mg Oral Daily   metoprolol  tartrate  25 mg Oral BID   midodrine  10 mg Oral TID WC   pantoprazole   40 mg Oral Daily   QUEtiapine   12.5 mg Oral QPC supper   timolol   1 drop Both Eyes Daily   venlafaxine   75 mg Oral BID WC    Continuous Infusions:  aztreonam  Stopped (07/10/24 0843)   heparin  750 Units/hr (07/10/24 1140)   metronidazole Stopped (07/10/24  22)   [START ON 07/11/2024] vancomycin       PRN Meds: acetaminophen  **OR** acetaminophen , haloperidol lactate, hydrOXYzine  Physical Exam Pulmonary:     Effort: Pulmonary effort is normal.  Skin:    General: Skin is warm and dry.  Neurological:     Mental Status: She is alert.             Vital Signs: BP (!) 161/115   Pulse 79   Temp 98 F (36.7 C) (Axillary)   Resp (!) 27   Ht 5' 3 (1.6 m)   Wt 59 kg   SpO2 93%   BMI 23.03 kg/m  SpO2: SpO2: 93 % O2 Device: O2 Device: Nasal Cannula O2 Flow Rate: O2 Flow Rate (L/min): 2 L/min  Intake/output summary:  Intake/Output Summary (Last 24 hours) at 07/10/2024 1619 Last data filed at 07/10/2024 1129 Gross per 24 hour  Intake 841.94 ml  Output --  Net 841.94 ml   LBM: Last BM Date : 07/07/24 Baseline Weight: Weight: 59 kg Most recent weight: Weight: 59 kg    Patient Active Problem List   Diagnosis Date Noted   Critical limb ischemia of both lower extremities (HCC) 07/09/2024   Hyperlipidemia 07/09/2024   Meningioma (HCC) 07/09/2024   Ischemic ulcer of both feet (HCC) 07/09/2024   Altered mental status 07/08/2024   Failure to thrive syndrome, adult 07/08/2024   Hospital discharge follow-up 05/24/2024   Confusion 05/21/2024   Acute heart failure with preserved ejection fraction (HCC) 05/15/2024   Hypokalemia 05/14/2024   Shortness of breath 05/14/2024   Atrial fibrillation (HCC) 05/10/2024   Chronic atrial fibrillation (HCC) 05/10/2024   DNR (do not resuscitate) discussion 04/03/2024   Lymphedema  04/03/2024   Cellulitis of foot, right 03/11/2024   Chronic diastolic CHF (congestive heart failure) (HCC) 01/17/2022   Generalized weakness 12/06/2021   Chronic venous insufficiency 11/10/2019   PAD (peripheral artery disease) 11/04/2018   Malnutrition of mild degree 01/21/2018   Edema of extremities 08/07/2017   Advance directive discussed with patient 10/13/2014   Female stress incontinence 04/02/2013   Weakness 03/18/2013   Neuropathy (HCC) 08/01/2012   Hyperthyroidism    Routine general medical examination at a health care facility 05/09/2011   Episodic mood disorder 12/19/2006   Essential hypertension 12/19/2006   RBBB 12/19/2006   ALLERGIC RHINITIS 12/19/2006   GERD 12/19/2006   DIVERTICULOSIS, COLON 12/19/2006    Palliative Care Assessment & Plan   Recommendations/Plan: DNR/DNI. Time for outcomes and continue to treat the treatable H POA document is noted under ACP tab.  Sister [name] is H POA. PMT will shadow for decline over the weekend.  Code Status:    Code Status Orders  (From admission, onward)           Start     Ordered   07/09/24 0000  Do not attempt resuscitation (DNR)- Limited -Do Not Intubate (DNI)  Continuous       Question Answer Comment  If pulseless and not breathing No CPR or chest compressions.   In Pre-Arrest Conditions (Patient Is Breathing and Has A Pulse) Do not intubate. Provide all appropriate non-invasive medical interventions. Avoid ICU transfer unless indicated or required.   Consent: Discussion documented in EHR or advanced directives reviewed      07/09/24 0001           Code Status History     Date Active Date Inactive Code Status Order ID Comments User Context   07/03/2024 [id number] 07/08/2024 1716 DNR  [id number]  Marylynn Verneita CROME, MD Outpatient   05/10/2024 1312 05/21/2024 2215 Full Code [id number]  Roann Gouty, MD ED   04/03/2024 1257 05/10/2024 0952 DNR [id number]  Marylynn Verneita CROME, MD Outpatient   01/15/2022 0048 01/18/2022 2233  Full Code [id number]  Cox, Greig SAILOR, DO ED      Advance Directive Documentation    Flowsheet Row Most Recent Value  Type of Advance Directive Healthcare Power of Attorney, Living will  Pre-existing out of facility DNR order (yellow form or pink MOST form) --  MOST Form in Place? --  nd coordinating care related to the above assessment and plan.  Camelia Lewis, NP  Please contact Palliative Medicine Team phone at [phone] for questions and concerns.

## 2024-07-10 NOTE — ED Notes (Signed)
 The pt is a 78 yof lying supine in the bed with her head elevated. The pt is alert to her name and some questions. The pt is warm, pink, and dry. The pt has 20 G IV in her left hand. The pt's left hand appears red and swollen. The pt was receiving IV potassium through the 20 G IV. The medication was stopped. Will continue to monitor pt's left hand. Provider notified.

## 2024-07-10 NOTE — Progress Notes (Signed)
 [facility] [phone]  Outpatient [facility] Liaison Note  This patient is currently enrolled in [facility] outpatient-based palliative care.  Patient's sister, [name] Clarity (HCPOA), called [facility] to request patient be evaluated for hospice at home.Spoke with [name] initiate education related to hospice philosophy, services, and team approach to care. She verbalizes understanding of information given.  Discharge planning is ongoing.  DME needs discussed. Patient has necessary equipment in the home.  Caregiver states they do not need additional equipment at this time.  [facility] information and contact numbers given to [name] Clarity.    Please call with any questions or concerns.  Thank you for the opportunity to participate in this patient's care.  Inocente Jacobs BSN RN [facility] Liaison [phone]

## 2024-07-10 NOTE — ED Notes (Signed)
Dr. Gollan, MD at bedside at this time.  

## 2024-07-10 NOTE — ED Notes (Signed)
 Lab at bedside to assist with blood culture and additional lab order obtainment.

## 2024-07-10 NOTE — ED Notes (Signed)
 Shift assessment:  22g IV placed by IV team is in upper inside left arm and working properly.  Pt needs another line and IV stated she will need picc or central because her veins are so bad. Charge and Dr. Lawence made aware of the need and the reason why IV meds and antibiotics are behind schedule.  Pulling PO meds to give to Pt.  Pt is able to answer name and president's Trump's name.  States it's Monday and 1995.  She thought she was in [facility] but remembers being told she was not.  She was able to piece together Nurse - Patient = hospital as well as [place] in [facility].  Pt also states she is ready to go home and is being held here and she has a car with keys.  Reminded her of why she was here, she mentioned a Careers adviser and then back to leaving again.  She does yell out NURSE and NEEDS HELP to non staff people walking by in a very concerning loud voice.  I have asked her not to do that because it scares the people.  She felt that it was ok because there were no children here.  Asked again due to scaring patients and guests.

## 2024-07-10 NOTE — Progress Notes (Signed)
 The patient is admitted from ED to 2 A. She's A O X 2. No s/s of pain or any distress. The patient is oriented to staff, ascom/call bell. Will continue to monitor.

## 2024-07-11 DIAGNOSIS — I951 Orthostatic hypotension: Secondary | ICD-10-CM

## 2024-07-11 DIAGNOSIS — I878 Other specified disorders of veins: Secondary | ICD-10-CM | POA: Diagnosis not present

## 2024-07-11 DIAGNOSIS — G928 Other toxic encephalopathy: Secondary | ICD-10-CM

## 2024-07-11 DIAGNOSIS — I70223 Atherosclerosis of native arteries of extremities with rest pain, bilateral legs: Secondary | ICD-10-CM | POA: Diagnosis not present

## 2024-07-11 DIAGNOSIS — E0590 Thyrotoxicosis, unspecified without thyrotoxic crisis or storm: Secondary | ICD-10-CM | POA: Diagnosis not present

## 2024-07-11 DIAGNOSIS — L03119 Cellulitis of unspecified part of limb: Secondary | ICD-10-CM | POA: Diagnosis not present

## 2024-07-11 DIAGNOSIS — I4811 Longstanding persistent atrial fibrillation: Secondary | ICD-10-CM | POA: Diagnosis not present

## 2024-07-11 DIAGNOSIS — R6521 Severe sepsis with septic shock: Secondary | ICD-10-CM | POA: Diagnosis not present

## 2024-07-11 DIAGNOSIS — R6520 Severe sepsis without septic shock: Secondary | ICD-10-CM

## 2024-07-11 DIAGNOSIS — R5381 Other malaise: Secondary | ICD-10-CM

## 2024-07-11 DIAGNOSIS — R441 Visual hallucinations: Secondary | ICD-10-CM

## 2024-07-11 DIAGNOSIS — A419 Sepsis, unspecified organism: Secondary | ICD-10-CM | POA: Diagnosis not present

## 2024-07-11 DIAGNOSIS — I4891 Unspecified atrial fibrillation: Secondary | ICD-10-CM | POA: Diagnosis not present

## 2024-07-11 DIAGNOSIS — G9341 Metabolic encephalopathy: Secondary | ICD-10-CM

## 2024-07-11 DIAGNOSIS — E876 Hypokalemia: Secondary | ICD-10-CM

## 2024-07-11 LAB — BASIC METABOLIC PANEL WITH GFR
Anion gap: 16 — ABNORMAL HIGH (ref 5–15)
BUN: 16 mg/dL (ref 8–23)
CO2: 31 mmol/L (ref 22–32)
Calcium: 9.1 mg/dL (ref 8.9–10.3)
Chloride: 95 mmol/L — ABNORMAL LOW (ref 98–111)
Creatinine, Ser: 0.42 mg/dL — ABNORMAL LOW (ref 0.44–1.00)
GFR, Estimated: >60 mL/min (ref >60)
Glucose, Bld: 125 mg/dL — ABNORMAL HIGH (ref 70–99)
Potassium: 4 mmol/L (ref 3.5–5.1)
Sodium: 142 mmol/L (ref 135–145)

## 2024-07-11 LAB — APTT
aPTT: 138 s — ABNORMAL HIGH (ref 24–36)
aPTT: 63 s — ABNORMAL HIGH (ref 24–36)
aPTT: 143 s — ABNORMAL HIGH (ref 24–36)

## 2024-07-11 LAB — HEPARIN LEVEL (UNFRACTIONATED): Heparin Unfractionated: >1.1 [IU]/mL — ABNORMAL HIGH (ref 0.30–0.70)

## 2024-07-11 MED ORDER — HEPARIN BOLUS VIA INFUSION
800.0000 [IU] | Freq: Once | INTRAVENOUS | Status: AC
  Administered 2024-07-11: 800 [IU] via INTRAVENOUS
  Filled 2024-07-11: qty 800

## 2024-07-11 MED ORDER — LABETALOL HCL 5 MG/ML IV SOLN
20.0000 mg | INTRAVENOUS | Status: DC | PRN
  Administered 2024-07-11: 20 mg via INTRAVENOUS
  Filled 2024-07-11: qty 4

## 2024-07-11 MED ORDER — HYDROXYZINE HCL 25 MG PO TABS
25.0000 mg | ORAL_TABLET | Freq: Three times a day (TID) | ORAL | Status: DC | PRN
  Administered 2024-07-11 – 2024-07-12 (×3): 25 mg via ORAL
  Filled 2024-07-11 (×3): qty 1

## 2024-07-11 MED ORDER — ENSURE PLUS HIGH PROTEIN PO LIQD
237.0000 mL | Freq: Two times a day (BID) | ORAL | Status: DC
  Administered 2024-07-11 – 2024-07-13 (×6): 237 mL via ORAL

## 2024-07-11 NOTE — Progress Notes (Signed)
 Pharmacy Antibiotic Note  Alexandria Sherman is a 78 y.o. female admitted on 07/08/2024 with infection of unknown source.  Pharmacy has been consulted for Vancomycin  dosing.  Scr <0.30 (stable), and end date added to Vancomycin  therapy. Per MD , she will like continue Abx as written for now.  Plan: Vancomycin  1250 mg LD given, followed by Vancomycin  1000 mg IV Q 24 hrs. Will continue current Vancomycin  dose. Goal AUC 400-550. Expected AUC: 540.5 SCr used: 0.8 (10/16 SCr = 0.37) Expected Cmin:  Follow up culture results to assess for antibiotic optimization. Monitor renal function to assess for any necessary antibiotic dosing changes. Pharmacy will continue to follow and will adjust abx dosing whenever warranted.  Temp (24hrs), Avg:97.7 F (36.5 C), Min:97.3 F (36.3 C), Max:98.2 F (36.8 C)   Recent Labs  Lab 07/08/24 1733 07/08/24 2100 07/08/24 2101 07/09/24 0320 07/10/24 0119 07/10/24 0444 07/10/24 2312  WBC 10.7*  --   --   --   --  8.4  --   CREATININE 0.38*  --   --   --  0.37* <0.30*  --   LATICACIDVEN 2.9*   < > 2.1* 2.4* 0.7 0.9 2.4*   < > = values in this interval not displayed.   Allergies  Allergen Reactions   Doxycycline  Other (See Comments)    Hallucinations, nocturnal:  In the night felt like she was flipping in the bed.   Augmentin [Amoxicillin-Pot Clavulanate] Other (See Comments)    Pt cannot remember   Cephalexin     REACTION: vaginal blisters Tolerates penicillins   Codeine Phosphate     REACTION: vomiting   Promethazine Hcl     REACTION: nerves    Antimicrobials this admission: 10/16 Aztreonam  >>  10/16 Flagyl >> x 7 days 10/16 Vancomycin  >> x 7 days  Microbiology results: 10/16 BCx: NG x 2 days  Alexandria Sherman PharmD, BCPS 07/11/2024 8:14 AM

## 2024-07-11 NOTE — Progress Notes (Signed)
 Wound consult order placed for bilateral heel wounds and great toe wound. MD notified.

## 2024-07-11 NOTE — Progress Notes (Signed)
 Bilateral heel wounds cleansed with vashe and covered with xeroform and foam. Right toe wound painted with betadine.

## 2024-07-11 NOTE — Care Management Important Message (Signed)
 Important Message  Patient Details  Name: GLEE LASHOMB MRN: [id number] Date of Birth: 1946/05/17   Important Message Given:  Yes - Medicare IM     Rojelio [name] 07/11/2024, 4:39 PM

## 2024-07-11 NOTE — Consult Note (Signed)
 PHARMACY - ANTICOAGULATION CONSULT NOTE  Pharmacy Consult for IV Heparin  Indication: atrial fibrillation and limb ischemia  Patient Measurements: Height: 5' 3 (160 cm) Weight: 59 kg (130 lb) IBW/kg (Calculated) : 52.4 HEPARIN  DW (KG): 59  Labs: Recent Labs    07/08/24 1733 07/08/24 2100 07/10/24 0119 07/10/24 0444 07/11/24 0304 07/11/24 1220  HGB 11.5*  --   --  10.2*  --   --   HCT 37.1  --   --  33.7*  --   --   PLT 232  --   --  185  --   --   APTT  --   --   --  48* 138* 63*  LABPROT  --   --   --  20.5*  --   --   INR  --   --   --  1.7*  --   --   HEPARINUNFRC  --   --   --   --  >1.10*  --   CREATININE 0.38*  --  0.37* <0.30*  --  0.42*  TROPONINIHS  --  14  --   --   --   --     Estimated Creatinine Clearance: 47.2 mL/min (A) (by C-G formula based on SCr of 0.42 mg/dL (L)).   Medical History: Past Medical History:  Diagnosis Date   Acute lateral meniscus tear of right knee    Anxiety disorder, unspecified    Atrial fibrillation with rapid ventricular response (HCC) 05/11/2024   Cataract [facility]   Clotting disorder blood circulation/veins in feet and lower legs   Depression 1978   Diverticulosis of colon 04/2005   GERD (gastroesophageal reflux disease)    Glaucoma [facility]   Hypertension    Hyperthyroidism    Dr Damian   Impaired fasting glucose    Low TSH level    chronic low, but euthyroid   Microscopic hematuria    Multiple thyroid  nodules    Persistent atrial fibrillation (HCC)    RBBB    S/p left hip fracture    Sepsis (HCC) 05/14/2024   Sleep disturbance    Subclinical hyperthyroidism    unspecified   Tibia fracture 05/2010   left    Medications:  Apixaban  5 mg BID on hold pending possible procedures  Assessment: Patient is a 78 y/o F with medical history as above and including Afib on Eliquis  who is admitted with limb ischemia complicated by bilateral gangrenous changes. Apixaban  has been placed on hold pending  possible procedures. Pharmacy consulted to dose heparin .  Baseline INR 1.7 and aPTT 48s slightly elevated  Date Time Results Comments 10/17 0304 aPTT 138 supratherapeutic / HL >1.1 (rate 750 u/h) 10/17 1220 aPTT 63 SUB-therapeutic, rate 600 units/hr  Goal of Therapy:  Heparin  level 0.3-0.7 units/ml aPTT 66-102 seconds Monitor platelets by anticoagulation protocol: Yes   Plan:  Heparin  800 units IV bolus via infusion, then increase rate to 700 units/hr Recheck aPTT 8 hrs after rate change Daily CBC and HL per protocol. Follow aPTT until correlating with HL  Tomoki Lucken Rodriguez-Guzman PharmD, BCPS 07/11/2024 1:09 PM

## 2024-07-11 NOTE — Plan of Care (Signed)
  Problem: Pain Managment: Goal: General experience of comfort will improve and/or be controlled Outcome: Progressing   Problem: Education: Goal: Knowledge of General Education information will improve Description: Including pain rating scale, medication(s)/side effects and non-pharmacologic comfort measures Outcome: Not Progressing   Problem: Clinical Measurements: Goal: Ability to maintain clinical measurements within normal limits will improve Outcome: Not Progressing Goal: Will remain free from infection Outcome: Not Progressing Goal: Respiratory complications will improve Outcome: Not Progressing Goal: Cardiovascular complication will be avoided Outcome: Not Progressing   Problem: Activity: Goal: Risk for activity intolerance will decrease Outcome: Not Progressing   Problem: Skin Integrity: Goal: Risk for impaired skin integrity will decrease Outcome: Not Progressing

## 2024-07-11 NOTE — Evaluation (Signed)
 Physical Therapy Evaluation Patient Details Name: Alexandria Sherman MRN: [id number] DOB: 1946/05/11 Today's Date: 07/11/2024  History of Present Illness  Patient is a 78 year old female with concern for altered mental status and hallucinations. A fib with RVR. Chronic lower extremity lymphedema and weakness resulting in nonambulatory state since her hip fracture years ago. PMH: HTN, HLD, paroxsymal atrial fibrillation, hyperthyroidism, venous stasis, and mild cognitive impairment.    Clinical Impression  The patient was cooperative during session. She is requesting to go home. She reports she lives alone but has caregiver assistance for mobility. Apparently she is mostly bed bound at home and needs assistance for pivot transfers. Patient does have some confusion and unable to state how frequently she has caregiver assistance.  Today the patient was only agreeable to sit up in bed. She declined standing today with no specific reason given. Of note, she required +2 person assistance for standing during her last hospital stay. Anticipate the patient will need continued caregiver assistance at home. Otherwise, consider rehabilitation after this hospital stay. PT will continue to follow.       If plan is discharge home, recommend the following: A lot of help with walking and/or transfers;A lot of help with bathing/dressing/bathroom;Assistance with cooking/housework;Assistance with feeding;Direct supervision/assist for medications management;Assist for transportation;Help with stairs or ramp for entrance;Direct supervision/assist for financial management;Supervision due to cognitive status   Can travel by private vehicle   No    Equipment Recommendations None recommended by PT  Recommendations for Other Services       Functional Status Assessment Patient has had a recent decline in their functional status and demonstrates the ability to make significant improvements in function in a reasonable and  predictable amount of time.     Precautions / Restrictions Precautions Precautions: Fall Recall of Precautions/Restrictions: Impaired Restrictions Weight Bearing Restrictions Per Provider Order: No      Mobility  Bed Mobility Overal bed mobility: Needs Assistance             General bed mobility comments: supine to long sitting with Min A for trunk. patient declined further mobility with no real reason specified    Transfers                   General transfer comment: patient declined but reports she has caregiver assistance for limited OOB activity at baseline. she required +2 person assistance for transfers during hosptial stay in August of this year    Ambulation/Gait                  Stairs            Wheelchair Mobility     Tilt Bed    Modified Rankin (Stroke Patients Only)       Balance Overall balance assessment: Needs assistance Sitting-balance support: No upper extremity supported Sitting balance-Leahy Scale: Good                                       Pertinent Vitals/Pain Pain Assessment Pain Assessment: No/denies pain    Home Living Family/patient expects to be discharged to:: Private residence Living Arrangements: Alone Available Help at Discharge: Personal care attendant;Available PRN/intermittently Type of Home: House Home Access: Level entry       Home Layout: One level Home Equipment: Agricultural consultant (2 wheels);Wheelchair - manual Additional Comments: unsure how long personal caregivers are present at home  Prior Function Prior Level of Function : Needs assist;Patient poor historian/Family not available             Mobility Comments: caregiver assistance needed for pivot OOB. mostly bed bound at baseline ADLs Comments: assistance required     Extremity/Trunk Assessment   Upper Extremity Assessment Upper Extremity Assessment: Generalized weakness (possible arthritic changes to  hands.)    Lower Extremity Assessment Lower Extremity Assessment: Generalized weakness       Communication   Communication Communication: Impaired Factors Affecting Communication: Hearing impaired    Cognition Arousal: Alert Behavior During Therapy: WFL for tasks assessed/performed   PT - Cognitive impairments: No family/caregiver present to determine baseline, History of cognitive impairments, Sequencing, Initiation, Memory                         Following commands: Impaired Following commands impaired: Follows one step commands with increased time     Cueing Cueing Techniques: Verbal cues     General Comments General comments (skin integrity, edema, etc.): vitals stable during session    Exercises     Assessment/Plan    PT Assessment Patient needs continued PT services  PT Problem List Decreased strength;Decreased range of motion;Decreased activity tolerance;Decreased balance;Decreased mobility;Decreased cognition;Decreased knowledge of use of DME;Decreased safety awareness;Decreased knowledge of precautions       PT Treatment Interventions DME instruction;Functional mobility training;Therapeutic activities;Therapeutic exercise;Balance training;Neuromuscular re-education;Cognitive remediation;Patient/family education;Wheelchair mobility training    PT Goals (Current goals can be found in the Care Plan section)  Acute Rehab PT Goals Patient Stated Goal: to go home PT Goal Formulation: With patient Time For Goal Achievement: 07/25/24 Potential to Achieve Goals: Fair    Frequency Min 1X/week     Co-evaluation               AM-PAC PT 6 Clicks Mobility  Outcome Measure Help needed turning from your back to your side while in a flat bed without using bedrails?: A Little Help needed moving from lying on your back to sitting on the side of a flat bed without using bedrails?: A Little Help needed moving to and from a bed to a chair (including a  wheelchair)?: A Lot Help needed standing up from a chair using your arms (e.g., wheelchair or bedside chair)?: A Lot Help needed to walk in hospital room?: Total Help needed climbing 3-5 steps with a railing? : Total 6 Click Score: 12    End of Session Equipment Utilized During Treatment: Oxygen Activity Tolerance: Patient limited by fatigue Patient left: in bed;with call bell/phone within reach;with bed alarm set Nurse Communication: Mobility status PT Visit Diagnosis: Muscle weakness (generalized) (M62.81);Unsteadiness on feet (R26.81)    Time: 1003-1017 PT Time Calculation (min) (ACUTE ONLY): 14 min   Charges:   PT Evaluation $PT Eval Moderate Complexity: 1 Mod   PT General Charges $$ ACUTE PT VISIT: 1 Visit         Randine Essex, PT, MPT   Randine LULLA Essex 07/11/2024, 11:13 AM

## 2024-07-11 NOTE — Progress Notes (Signed)
 PROGRESS NOTE Alexandria Sherman    DOB: Feb 18, 1946, 78 y.o.  FMW:[id number]    Code Status: Limited: Do not attempt resuscitation (DNR) -DNR-LIMITED -Do Not Intubate/DNI    DOA: 07/08/2024   LOS: 2  Brief hospital course  Alexandria Sherman is a 78 y.o. female with a PMH significant for HTN, HLD, paroxsymal atrial fibrillation, hyperthyroidism, venous stasis, and mild cognitive impairment presenting to the ED from the clinic due to concern for altered mental status and hallucination. Prior to hospitalization, pt was still dependant for ADLs and iADLs but it was exacerbated by her hospitalization. She has been declining per the sister and was recommended hospice by PCP but pt switched PCP.    ED Course: VSS tachycardia with heart rate up to 130s and A-fib RVR, blood pressure stable, requiring 2 L oxygen which is baseline to maintain sats greater than 95%. Significant findings included LA 2.9>>>2.4.  WBC 10.7, hemoglobin 11.5, platelets 213.  Na+ 141, K+ 3.6, glucose 167, creatinine 0.38.  BNP 479.  Digoxin  less than 0.6 valproic acid  level 32.  UA positive for large hemoglobin, greater than 50 RBC, large leukocytes, no bacteria.  Limited 20 squamous epithelial cells.  TSH 3.461. ABG    Component Value Date/Time   PHART 7.3 (L) 05/13/2024 0006   PCO2ART 63 (H) 05/13/2024 0006   PO2ART 65 (L) 05/13/2024 0006   HCO3 45.9 (H) 07/08/2024 1945   O2SAT 94.2 05/13/2024 0006   CTH showed 1.2 cm meningioma.  ECG: afib with RVR.   They were initially treated with Eliquis , diltiazem , metoprolol , midodrine, aztreonam , cefepime, heparin , LR, Flagyl, vancomycin .   Patient was admitted to medicine service for further workup and management of A-fib RVR, septic shock as outlined in detail below.  07/11/24 -more hemodynamically stable. Still very disoriented. Appears to be asymptomatic from afib standpoint.   Assessment & Plan  Principal Problem:   Critical limb ischemia of both lower extremities (HCC) Active  Problems:   Chronic diastolic CHF (congestive heart failure) (HCC)   Essential hypertension   Hyperthyroidism   PAD (peripheral artery disease)   Atrial fibrillation (HCC)   Confusion   Hyperlipidemia   Meningioma (HCC)   Ischemic ulcer of both feet (HCC)  Persistent Afib- limited treatment options given co-morbidities. Better rate control today but still up to 120s on exam today.  - cardiology consulted, appreciate recs  - s/p a dose of digoxin   - continue BID metoprolol . (Of note, family thinks the metoprolol  is what made patient have hallucinations so maybe consider transition to alternative BB).   - patient being put on heparin  gtt in event that she would potentially have debridement this hospitalization. She is not candidate for ablation at this time. On eliquis  at home - continue telemetry  - palliative consulted  Hypotension- this is likely multifactorial with medications side effects, RVR, and possible septic shock from LE cellulitis. Greatly improved with treatment - IV fluids stopped for exacerbating poor cardiac output - continue midodrine.  - diltiazem  was discontinued - avoid pressors if possible given questionable blood flow to LEs.   Lower extremity ischemia ruled out- LE arteries patent on imaging. No intervention required, per vascular surgery consult. Appreciate your recs  Lower extremity cellulitis vs infected wound from chronic venous stasis/pressure on heel. - continue IV Abx  - aztreonam , flagyl, vancomycin  - f/u cultures- NGTD - WOC - if stabilizes from above problems, can consider podiatry consult if a candidate for debridement.  - pad heels  hyperthyroidism- TSH WNL  but maybe still contributing to difficult to control afib - continue home methimazole    Chronic hypoxic respiratory failure- endorses 2Lnc is her baseline - at baseline O2 requirement but had observed choking as suspected aspiration when taking morning pills - SLP consulted and made  dysphagia 2. No observed aspiration on eval.   Acute delirium  Anxiety- possibly medication side effect. Brain MRI with findings below - hydroxyzine PRN - continue depakote , venlafaxine , seroquel    Meningiomas: Unsure if this is contributing to her hallucinations. Brain MRI: 1.6cm and 1.7cm lesions- negative for evidence of edema or mass effect.  Will need neurology consult.   Body mass index is 23.03 kg/m.  VTE ppx: heparin  gtt  Diet:     Diet   DIET DYS 2 Room service appropriate? No; Fluid consistency: Thin   Consultants: Cardiology  Palliative  Neurology   Subjective 07/11/24    Pt reports no identifiable concerns. She is alert and able to follow commands intermittently. She responds to questions in conversational manner but responses are not what would be appropriate response.    Objective  Blood pressure (!) 79/56, pulse 65, temperature 98 F (36.7 C), temperature source Axillary, resp. rate 17, height 5' 3 (1.6 m), weight 59 kg, SpO2 100%.  Intake/Output Summary (Last 24 hours) at 07/11/2024 0801 Last data filed at 07/11/2024 9272 Gross per 24 hour  Intake 1066.35 ml  Output --  Net 1066.35 ml   Filed Weights   07/08/24 1725  Weight: 59 kg    Physical Exam:  General: awake, alert, NAD HEENT: atraumatic, clear conjunctiva, anicteric sclera, MMM, hard of hearing Respiratory: normal respiratory effort. CTAB Cardiovascular: fast, irregular rate. Good cap refill.  Nervous: A&O x2. positive for paraphasia. Follows commands intermittently. Moving all limbs spontaneously  Extremities: erythematous LE- wounds per clinical images Skin: chronic venous stasis changes to LEs Psychiatry: normal mood, congruent affect       Labs   I have personally reviewed the following labs and imaging studies CBC    Component Value Date/Time   WBC 8.4 07/10/2024 0444   RBC 3.18 (L) 07/10/2024 0444   HGB 10.2 (L) 07/10/2024 0444   HCT 33.7 (L) 07/10/2024 0444   PLT 185  07/10/2024 0444   MCV 106.0 (H) 07/10/2024 0444   MCH 32.1 07/10/2024 0444   MCHC 30.3 07/10/2024 0444   RDW 15.2 07/10/2024 0444   LYMPHSABS 1.4 07/10/2024 0444   MONOABS 0.8 07/10/2024 0444   EOSABS 0.1 07/10/2024 0444   BASOSABS 0.0 07/10/2024 0444      Latest Ref Rng & Units 07/10/2024    4:44 AM 07/10/2024    1:19 AM 07/08/2024    5:33 PM  BMP  Glucose 70 - 99 mg/dL 894  896  832   BUN 8 - 23 mg/dL 16  16  19    Creatinine 0.44 - 1.00 mg/dL <9.69  9.62  9.61   Sodium 135 - 145 mmol/L 143  141  141   Potassium 3.5 - 5.1 mmol/L 3.1  3.1  3.6   Chloride 98 - 111 mmol/L 92  93  87   CO2 22 - 32 mmol/L 36  32  39   Calcium  8.9 - 10.3 mg/dL 8.3  8.0  9.3     DG Chest 1 View Result Date: 07/10/2024 EXAM: 1 VIEW(S) XRAY OF THE CHEST 07/10/2024 03:52:00 AM COMPARISON: 07/09/2024 CLINICAL HISTORY: Dyspnea SOB FINDINGS: LUNGS AND PLEURA: Low lung volumes with vascular crowding. Mildly patchy bibasilar opacities, likely atelectasis.  Possible small left pleural effusion. No pneumothorax. HEART AND MEDIASTINUM: No acute abnormality of the cardiac and mediastinal silhouettes. BONES AND SOFT TISSUES: Old left lateral clavicle fracture deformity. No acute osseous abnormality. IMPRESSION: 1. Low lung volumes with mild bibasilar atelectasis. 2. Possible small left pleural effusion. Electronically signed by: Pinkie Pebbles MD 07/10/2024 03:56 AM EDT RP Workstation: [id number]    Disposition Plan & Communication  Patient status: Inpatient  Admitted From: Home Planned disposition location: TBD Anticipated discharge date: TBD pending clinical improvement  Family Communication: none at bedside    Author: Marien LITTIE Piety, DO [facility] 07/11/2024, 8:01 AM   Available by Epic secure chat 7AM-7PM. If 7PM-7AM, please contact night-coverage.  [facility] contact information found on [email or web address].

## 2024-07-11 NOTE — Progress Notes (Addendum)
 Patient has bruising to bilateral arms near [facility]. There is also bruising to bilateral lower legs. Edges marked on bilateral legs.

## 2024-07-11 NOTE — Progress Notes (Signed)
 Progress Note  Patient Name: Alexandria Sherman Date of Encounter: 07/11/2024  Primary Cardiologist: Darron  Subjective   Denies chest pain or dyspnea.  Remains in A-fib with ventricular rates in the 80s to 90s bpm.  Back on metoprolol  and tolerating without off target effect.  Remains on midodrine.  Labs pending.  Inpatient Medications    Scheduled Meds:  divalproex   125 mg Oral BID   feeding supplement  237 mL Oral BID BM   latanoprost   1 drop Both Eyes QHS   methimazole   2.5 mg Oral Daily   metoprolol  tartrate  25 mg Oral BID   midodrine  10 mg Oral TID WC   pantoprazole   40 mg Oral Daily   QUEtiapine   12.5 mg Oral QPC supper   timolol   1 drop Both Eyes Daily   venlafaxine   75 mg Oral BID WC   Continuous Infusions:  aztreonam  1 g (07/10/24 2304)   heparin  600 Units/hr (07/11/24 0408)   metronidazole 500 mg (07/11/24 0612)   vancomycin  1,000 mg (07/11/24 0727)   PRN Meds: acetaminophen  **OR** acetaminophen , haloperidol lactate, hydrOXYzine   Vital Signs    Vitals:   07/11/24 0106 07/11/24 0157 07/11/24 0239 07/11/24 0444  BP: (!) 152/87 (!) 147/132 (!) 102/55 92/76  Pulse: 62 (!) 150 84   Resp: 17  16 17   Temp: (!) 97.3 F (36.3 C)   (!) 97.5 F (36.4 C)  TempSrc: Oral   Oral  SpO2: 94%  92% 90%  Weight:      Height:        Intake/Output Summary (Last 24 hours) at 07/11/2024 0815 Last data filed at 07/11/2024 9272 Gross per 24 hour  Intake 1066.35 ml  Output --  Net 1066.35 ml   Filed Weights   07/08/24 1725  Weight: 59 kg    Telemetry    A-fib with ventricular rates in the 80s to 90s bpm - Personally Reviewed  ECG    No new tracings - Personally Reviewed  Physical Exam   GEN: Elderly, frail, and chronically ill-appearing.   Neck: No JVD. Cardiac: Irregularly irregular, no murmurs, rubs, or gallops.  Respiratory: Clear to auscultation bilaterally.  GI: Soft, nontender, non-distended.   MS: Trace bilateral pretibial edema with  hyperpigmentation. Neuro:  Alert, intermittently confused; Nonfocal.  Psych: Normal affect.  Labs    Chemistry Recent Labs  Lab 07/08/24 1733 07/08/24 2100 07/10/24 0119 07/10/24 0444  NA 141  --  141 143  K 3.6  --  3.1* 3.1*  CL 87*  --  93* 92*  CO2 39*  --  32 36*  GLUCOSE 167*  --  103* 105*  BUN 19  --  16 16  CREATININE 0.38*  --  0.37* <0.30*  CALCIUM  9.3  --  8.0* 8.3*  PROT  --  7.0  --  5.9*  ALBUMIN  --  3.0*  --  2.6*  AST  --  19  --  16  ALT  --  11  --  10  ALKPHOS  --  67  --  56  BILITOT  --  0.8  --  0.8  GFRNONAA >60  --  >60 NOT CALCULATED  ANIONGAP 15  --  16* 15     Hematology Recent Labs  Lab 07/08/24 1733 07/10/24 0444  WBC 10.7* 8.4  RBC 3.59* 3.18*  HGB 11.5* 10.2*  HCT 37.1 33.7*  MCV 103.3* 106.0*  MCH 32.0 32.1  MCHC 31.0 30.3  RDW 15.1 15.2  PLT 232 185    Cardiac EnzymesNo results for input(s): TROPONINI in the last 168 hours. No results for input(s): TROPIPOC in the last 168 hours.   BNP Recent Labs  Lab 07/08/24 1733  BNP 479.3*     DDimer No results for input(s): DDIMER in the last 168 hours.   Radiology    DG Chest 1 View Result Date: 07/10/2024 IMPRESSION: 1. Low lung volumes with mild bibasilar atelectasis. 2. Possible small left pleural effusion. Electronically signed by: Pinkie Pebbles MD 07/10/2024 03:56 AM EDT RP Workstation: [id number]   MR BRAIN WO CONTRAST Result Date: 07/09/2024 IMPRESSION: 1. No acute intracranial abnormality. 2. 1.6 cm meningioma overlying the right temporal convexity, with additional 1.7 cm left parafalcine meningioma at the skull vertex. No associated edema or mass effect. Who Electronically signed by: Morene Hoard MD 07/09/2024 12:49 AM EDT RP Workstation: [id number]   DG Chest Port 1 View Result Date: 07/09/2024 IMPRESSION: 1. Expiratory film. 2. Consolidation or atelectasis in the hypoinflated bases. 3. Small pleural effusions, possibly chronic. 4. Recommend  repeat chest radiographs in full inspiration, preferably PA and lateral views if possible. Electronically signed by: Francis Quam MD 07/09/2024 12:41 AM EDT RP Workstation: [id number]   CT Head Wo Contrast Result Date: 07/08/2024 IMPRESSION: 1. No evidence of an acute intracranial abnormality on this motion-degraded study. 2. 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 Electronically signed by: Dasie Hamburg MD 07/08/2024 06:22 PM EDT RP Workstation: [id number]    Cardiac Studies   2D echo 05/11/2024:  1. Left ventricular ejection fraction, by estimation, is 50 to 55%. The  left ventricle has low normal function. The left ventricle has no regional  wall motion abnormalities. Left ventricular diastolic parameters are  indeterminate.   2. Right ventricular systolic function is normal. The right ventricular  size is normal.   3. The mitral valve is normal in structure. Mild mitral valve  regurgitation.   4. The aortic valve is tricuspid. Aortic valve regurgitation is not  visualized.   5. The inferior vena cava is normal in size with greater than 50%  respiratory variability, suggesting right atrial pressure of 3 mmHg.   Patient Profile     78 y.o. female with history of coronary artery calcifications on CT imaging, recently diagnosed A-fib, chronic sinus tachycardia, hypertension, hyperlipidemia, chronic venous insufficiency/lymphedema, anxiety, depression, GERD, and hyperthyroidism (diagnosed August 2025) who was admitted with encephalopathy secondary to sepsis secondary to calcaneal pressure ulcers and we are seeing for A-fib with RVR.  Assessment & Plan    1. Persistent Afib with RVR: -Elevated ventricular rates in the setting of acute infection with associated sepsis and relative hypotension  -Ventricular rates are improved in the 80s to 90s bpm, will tolerate rates less than 110 bpm -Overall, rate control options are limited with amiodarone not ideal with underlying hyperthyroidism and  hypotension precluding escalation of AV nodal blocking medication, digoxin  not ideal with frail state and underlying hypokalemia -Continue metoprolol  12.5 mg twice daily -Heparin  gtt with recommendation to resume PTA Eliquis  5 mg bid prior to discharge  2. Hypotension: -Requiring midodrine -Suspect elevated BP readings are in the setting of supine hypertension associated with midodrine  -Needs to sit up with midodrine   3. Hypokalemia: -Labs pending -Replete to goal 4.0  4. Hyperthyroidism: -TSH normal -Remains on methimazole       For questions or updates, please contact [facility] Please consult [email or web address] for contact info under Cardiology/STEMI.    Signed, Bernardino Bring, PA-C [facility] Pager: [phone] 07/11/2024, 8:15 AM

## 2024-07-11 NOTE — Progress Notes (Signed)
 Patient refuses to wear heel protecting boots.

## 2024-07-11 NOTE — Consult Note (Signed)
 PHARMACY - ANTICOAGULATION CONSULT NOTE  Pharmacy Consult for IV Heparin  Indication: atrial fibrillation and limb ischemia  Patient Measurements: Height: 5' 3 (160 cm) Weight: 59 kg (130 lb) IBW/kg (Calculated) : 52.4 HEPARIN  DW (KG): 59  Labs: Recent Labs    07/08/24 1733 07/08/24 2100 07/10/24 0119 07/10/24 0444 07/11/24 0304  HGB 11.5*  --   --  10.2*  --   HCT 37.1  --   --  33.7*  --   PLT 232  --   --  185  --   APTT  --   --   --  48* 138*  LABPROT  --   --   --  20.5*  --   INR  --   --   --  1.7*  --   HEPARINUNFRC  --   --   --   --  >1.10*  CREATININE 0.38*  --  0.37* <0.30*  --   TROPONINIHS  --  14  --   --   --     CrCl cannot be calculated (This lab value cannot be used to calculate CrCl because it is not a number: <0.30).   Medical History: Past Medical History:  Diagnosis Date   Acute lateral meniscus tear of right knee    Anxiety disorder, unspecified    Atrial fibrillation with rapid ventricular response (HCC) 05/11/2024   Cataract [facility]   Clotting disorder blood circulation/veins in feet and lower legs   Depression 1978   Diverticulosis of colon 04/2005   GERD (gastroesophageal reflux disease)    Glaucoma [facility]   Hypertension    Hyperthyroidism    Dr Damian   Impaired fasting glucose    Low TSH level    chronic low, but euthyroid   Microscopic hematuria    Multiple thyroid  nodules    Persistent atrial fibrillation (HCC)    RBBB    S/p left hip fracture    Sepsis (HCC) 05/14/2024   Sleep disturbance    Subclinical hyperthyroidism    unspecified   Tibia fracture 05/2010   left    Medications:  Apixaban  5 mg BID on hold pending possible procedures  Assessment: Patient is a 78 y/o F with medical history as above and including Afib on Eliquis  who is admitted with limb ischemia complicated by bilateral gangrenous changes. Apixaban  has been placed on hold pending possible procedures. Pharmacy consulted to  dose heparin .  Baseline INR 1.7 and aPTT 48s slightly elevated  10/17 0304 aPTT 138, supratherapeutic / HL >1.1  Goal of Therapy:  Heparin  level 0.3-0.7 units/ml aPTT 66-102 seconds Monitor platelets by anticoagulation protocol: Yes   Plan:  --Decrease heparin  infusion to 600 units/hr --Recheck aPTT in 8 hours --Daily CBC and HL per protocol. Follow aPTT until correlating with HL  Rankin CANDIE Dills, PharmD, [facility] 07/11/2024 3:55 AM

## 2024-07-11 NOTE — Progress Notes (Signed)
 Speech Language Pathology Treatment: Dysphagia  Patient Details Name: Alexandria Sherman MRN: [id number] DOB: 11-20-1945 Today's Date: 07/11/2024 Time: 9069-9057 SLP Time Calculation (min) (ACUTE ONLY): 12 min  Assessment / Plan / Recommendation Clinical Impression  Pt seen for ongoing dysphagia management for assessment of least restrictive diet. Pt presents with continued disorientation (A&O x 2), difficulty following directions as well as sustaining attention to tasks. She also presents as severely HOH.   Skilled observation was provided of pt consuming her dysphagia 2 breakfast tray with thin liquids via straw. Pt required total A for self-feeding. She presents with mildly prolonged oral phase d/t extended mastication of soft solids and decreased lingual manipulation. Her pharyngeal phase was unremarkable with no overt s/s of aspiration when consuming consecutive sips of thin liquids via straw. Pt with short attention to PO consumption and refused further intake after ~ 10 bites of her food.   Given acute hospitalization, AMS, assistance with self feeding and prolonged oral phase, recommend she remain on dysphagia 2 diet with thin liquids, medicine crushed in puree. Pt is not a candidate for skilled ST services.    HPI HPI: Per H&P: Alexandria Sherman is a 78 y.o. year old female with past medical history of HTN, HLD, paroxsymal atrial fibrillation, hyperthyroidism, venous stasis, and mild cognitive impairment presenting to the ED from the clinic due to concern for altered mental status and hallucination. Appears PCP advised pt to seek care on 10/09 but pt refused along with ischemic changes noted on her feet.     Pt states she is feeling well and currently doesn't have any complaints. When talking about her feet, she states she doesn't know how long they have been like this but it is not painful and her feet stay cold all the time.      Pt states she sees visual hallucinations and they have been  present for over a week. She denies any hx of psychiatric disorder.        Spoke with pt's sister. Pt noted to be declining since last hospital discharge. She has been having hallucination since last discharge and per the sister when she visited in September her feet were in very poor condition and very cold at that time.  States she thinks this is due to metoprolol  side effect.       Prior to hospitalization, pt was still dependant for ADLs and iADLs but it was exacerbated by her hospitalization. She has been declining per the sister and was recommended hospice by PCP but pt switched PCP.   DG Chest 07/10/24:Low lung volumes with mild bibasilar atelectasis.  2. Possible small left pleural effusion.      SLP Plan  All goals met          Recommendations  Diet recommendations: Dysphagia 2 (fine chop);Thin liquid Liquids provided via: Straw Medication Administration: Crushed with puree Supervision: Full supervision/cueing for compensatory strategies;Staff to assist with self feeding Compensations: Minimize environmental distractions;Slow rate;Small sips/bites Postural Changes and/or Swallow Maneuvers: Seated upright 90 degrees;Upright 30-60 min after meal                  Oral care BID;Staff/trained caregiver to provide oral care;Oral care before and after PO   Frequent or constant Supervision/Assistance Dysphagia, unspecified (R13.10)     All goals met   Alexandria Sherman B. Rubbie, M.S., CCC-SLP, Tree surgeon Certified Brain Injury Specialist [facility]  [facility] Office [phone] Ascom [phone] Fax [phone]

## 2024-07-11 NOTE — Consult Note (Addendum)
 WOC Nurse Consult Note: patient with history of PVD and lymphedema, previously followed by wound care center last seen 7/11 with no open wounds at that time, seen by [facility] team 8/26 for unna boots and no open wounds at that time; per PCP note has been home since last discharge largely bed bound resting heels on bed or ottoman and has developed pressure injuries  Reason for Consult: R toe and B heel wounds  Wound type: 1.  Unstageable Pressure Injuries B heels both with brown eschar  2.  Full thickness R great toe (distal tip and dorsal surface) likely r/t PAD eschar  Pressure Injury POA: Yes Measurement: see nursing flowsheet  Wound bed: as above  Drainage (amount, consistency, odor) see nursing flowsheet  Periwound: dusky discoloration of lower legs; patient has been seen by vascular surgeon for critical limb ischemia and gangrene; deferred any intervention at this time per their 10/15 note  Dressing procedure/placement/frequency:  Cleanse B heels with Vashe wound cleanser Soila [phone]) do not rinse and allow to air dry. Apply Xeroform gauze ([place] [phone]) to wound beds daily and secure with silicone foam or Kerlix roll gauze.  Place B feet in prevalon boots to offload pressure Soila [phone])  Paint R great toe wounds with Betadine 2 times daily and allow to air dry. May cover with dry gauze and tape or leave open to air whichever is preferred.  POC discussed with bedside nurse. WOC team will not follow. Re-consult if further needs arise.   Thank you,    Powell Bar MSN, RN-BC, [employer]

## 2024-07-11 NOTE — Consult Note (Signed)
 NEUROLOGY CONSULT NOTE   Date of service: July 11, 2024 Patient Name: Alexandria Sherman MRN:  [id number] DOB:  1945-10-26 Reason for Consultation: AMS Requesting Provider: Lenon Marien CROME, MD  History of Present Illness  Alexandria Sherman is a 78 y.o. female with a PMHx of atrial fibrillation with RVR, cataracts, BLE venous stasis, clotting disorder, depression, diverticulosis, glaucoma, HTN, hyperthyroidism, mild cognitive impairment and subclinical hyperthyroidism who was admitted on 10/15 after presenting to the ED from her clinic due to concern for AMS and hallucinations. On assessment by hospitalist, she was conversational and without complaints, but endorsed seeing visual hallucinations for > 1 week. Per Hospitalist's HPI, Alexandria Sherman noted to be declining since last hospital discharge. She has been having hallucination since last discharge and per the sister when she visited in September her feet were in very poor condition and very cold at that time.  States she thinks this is due to metoprolol  side effect. Prior to hospitalization, Alexandria Sherman was still dependant for ADLs and iADLs but it was exacerbated by her hospitalization. She has been declining per the sister and was recommended hospice by PCP but Alexandria Sherman switched PCP.   She has been continuing to experience hallucinations and disorientation during this admission. She is currently being treated for atrial fibrillation with RVR as well as sepsis from lower extremity wounds. Metabolically she's becoming more stable but mental status is the same or worse that it was at admission, per Hospitalist communication with me today.     ROS  She has no current complaints. She states everybody says my hands and feet are cold.   Past History   Past Medical History:  Diagnosis Date   Acute lateral meniscus tear of right knee    Anxiety disorder, unspecified    Atrial fibrillation with rapid ventricular response (HCC) 05/11/2024   Cataract [facility]    Clotting disorder blood circulation/veins in feet and lower legs   Depression 1978   Diverticulosis of colon 04/2005   GERD (gastroesophageal reflux disease)    Glaucoma [facility]   Hypertension    Hyperthyroidism    Dr Damian   Impaired fasting glucose    Low TSH level    chronic low, but euthyroid   Microscopic hematuria    Multiple thyroid  nodules    Persistent atrial fibrillation (HCC)    RBBB    S/p left hip fracture    Sepsis (HCC) 05/14/2024   Sleep disturbance    Subclinical hyperthyroidism    unspecified   Tibia fracture 05/2010   left   Past Surgical History:  Procedure Laterality Date   ABDOMINAL HYSTERECTOMY  1988   hysterectomy/ BSO for endometriosis   BREAST BIOPSY Right 1992   neg   BREAST EXCISIONAL BIOPSY Right 1988   benign per Alexandria Sherman   INTRAMEDULLARY (IM) NAIL INTERTROCHANTERIC Left 01/15/2022   Procedure: INTRAMEDULLARY (IM) NAIL INTERTROCHANTRIC;  Surgeon: Cleotilde Barrio, MD;  Location: [facility] ORS;  Service: Orthopedics;  Laterality: Left;   KNEE ARTHROSCOPY  6/12   left--Dr Ernie    Family History: Family History  Problem Relation Age of Onset   Cancer Mother        lung   Diabetes Mother    Depression Mother    Depression Sister    Parkinson's disease Sister    Cancer Sister    Depression Sister    Stroke Other    Heart attack Other    Cancer Other    Dementia Other  Breast cancer Neg Hx    Thyroid  disease Neg Hx     Social History  reports that she has never smoked. She has been exposed to tobacco smoke. She has never used smokeless tobacco. She reports current alcohol use. No history on file for drug use.  Allergies  Allergen Reactions   Doxycycline  Other (See Comments)    Hallucinations, nocturnal:  In the night felt like she was flipping in the bed.   Augmentin [Amoxicillin-Pot Clavulanate] Other (See Comments)    Alexandria Sherman cannot remember   Cephalexin     REACTION: vaginal blisters Tolerates penicillins   Codeine Phosphate      REACTION: vomiting   Promethazine Hcl     REACTION: nerves    Medications   Current Facility-Administered Medications:    acetaminophen  (TYLENOL ) tablet 650 mg, 650 mg, Oral, Q6H PRN **OR** acetaminophen  (TYLENOL ) suppository 650 mg, 650 mg, Rectal, Q6H PRN, Fernand Prost, MD   aztreonam  (AZACTAM ) 1 g in sodium chloride  0.9 % 100 mL IVPB, 1 g, Intravenous, Q8H, Mansy, Jan A, MD, Last Rate: 200 mL/hr at 07/11/24 1455, 1 g at 07/11/24 1455   divalproex  (DEPAKOTE ) DR tablet 125 mg, 125 mg, Oral, BID, Khan, Ghalib, MD, 125 mg at 07/11/24 0954   feeding supplement (ENSURE PLUS HIGH PROTEIN) liquid 237 mL, 237 mL, Oral, BID BM, Lenon Pons L, MD, 237 mL at 07/11/24 1436   haloperidol lactate (HALDOL) injection 1-2 mg, 1-2 mg, Intramuscular, Q6H PRN, Mansy, Jan A, MD, 1 mg at 07/10/24 1051   heparin  ADULT infusion 100 units/mL (25000 units/250mL), 700 Units/hr, Intravenous, Continuous, Lenon Pons CROME, MD, Last Rate: 7 mL/hr at 07/11/24 1409, 700 Units/hr at 07/11/24 1409   hydrOXYzine (ATARAX) tablet 25 mg, 25 mg, Oral, TID PRN, Lenon Pons CROME, MD, 25 mg at 07/11/24 1433   latanoprost  (XALATAN ) 0.005 % ophthalmic solution 1 drop, 1 drop, Both Eyes, QHS, Lenon Pons L, MD, 1 drop at 07/10/24 2306   methIMAzole  (TAPAZOLE ) tablet 2.5 mg, 2.5 mg, Oral, Daily, Wouk, Devaughn Sayres, MD, 2.5 mg at 07/11/24 9045   metoprolol  tartrate (LOPRESSOR ) tablet 25 mg, 25 mg, Oral, BID, Gollan, Timothy J, MD, 25 mg at 07/11/24 0954   metroNIDAZOLE (FLAGYL) IVPB 500 mg, 500 mg, Intravenous, Q12H, Dail Rankin RAMAN, RPH, Last Rate: 100 mL/hr at 07/11/24 0612, 500 mg at 07/11/24 0612   midodrine (PROAMATINE) tablet 10 mg, 10 mg, Oral, TID WC, Mansy, Jan A, MD, 10 mg at 07/11/24 1208   pantoprazole  (PROTONIX ) EC tablet 40 mg, 40 mg, Oral, Daily, Fernand Prost, MD, 40 mg at 07/11/24 9045   QUEtiapine  (SEROQUEL ) tablet 12.5 mg, 12.5 mg, Oral, QPC supper, Fernand Prost, MD, 12.5 mg at 07/10/24 2006    timolol  (TIMOPTIC ) 0.5 % ophthalmic solution 1 drop, 1 drop, Both Eyes, Daily, Wouk, Devaughn Sayres, MD, 1 drop at 07/11/24 9041   vancomycin  (VANCOCIN ) IVPB 1000 mg/200 mL premix, 1,000 mg, Intravenous, Q24H, Belue, Rankin RAMAN, RPH, Last Rate: 200 mL/hr at 07/11/24 0727, 1,000 mg at 07/11/24 0727   venlafaxine  (EFFEXOR ) tablet 75 mg, 75 mg, Oral, BID WC, Fernand Prost, MD, 75 mg at 07/11/24 0954  Vitals   Vitals:   07/11/24 0826 07/11/24 0853 07/11/24 0954 07/11/24 1333  BP: (!) 125/107 (!) 126/95 (!) 126/95 (!) 121/92  Pulse: 98 (!) 54 (!) 120 93  Resp: 18   18  Temp:  98.3 F (36.8 C)  98.1 F (36.7 C)  TempSrc:      SpO2: 93% 92%  99%  Weight:      Height:        Body mass index is 23.03 kg/m.   Physical Exam   Constitutional: Frail-appearing elderly female with dry oral mucosa and hoarse voice. NAD.  Psych: Bright affect.  Eyes: No scleral injection.  HENT: No OP obstruction. Voice is hoarse in the context of dry mouth.  Head: Normocephalic.  Respiratory: Effort normal, non-labored breathing.  Skin: Prominent BLE nonblanching purple discoloration of skin from shins to feet. No edema.   Neurologic Examination   Mental Status: Awake with moderately decreased level of alertness that improves as exam progresses. Attention is fair. Speech is sparse but fluent. She is able to follow all simple commands, but has difficulty with complex commands and questions. 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 She knows she is in [place]. Gave no answer when asked for the state several times. Not oriented to the day of the week, the year or the month. She is actively visually hallucinating during the exam, but is not agitated. She points to an empty chair in the corner of the room and smiles, stating that Todd is there. She states that Todd is a good friend of hers. She is heard calling Todd Todd! after the examiner exits the room. She does not seem to be aware  that she is hallucinating.  Cranial Nerves: II: Blinks to threat in temporal visual fields bilaterally. PERRL. III,IV, VI: No ptosis. EOMI. No nystagmus. V: Temp sensation equal bilaterally VII: Smile symmetric VIII: Hearing intact to voice IX,X: Vocalizations are hoarse in the context of her xerostomia.  XI: Symmetric XII: Midline tongue extension Motor: RUE: 4/5 deltoid, biceps and triceps, with 3/5 wrist extensors and finger extensors. Grip is 4-/5.  LUE: 4/5 deltoid, biceps and triceps, with 3/5 wrist extensors and finger extensors. Grip is 4-/5.  RLE: 2/5 HF, 3/5 knee extension. Unable to follow commands for testing of APF/ADF. LLE:  2/5 HF, 3/5 knee extension. Unable to follow commands for testing of APF/ADF. Sensory: Temp and FT intact x 4 without subjective asymmetry.   Deep Tendon Reflexes: 1+ and symmetric bilateral biceps and, brachioradialis. Unable to elicit patellar reflexes.  Cerebellar: No ataxia with FNF bilaterally, but slow and has difficulty due to finger and wrist extensor weakness Gait: Deferred   Labs/Imaging/Neurodiagnostic studies   CBC:  Recent Labs  Lab Aug 01, 2024 1733 07/10/24 0444  WBC 10.7* 8.4  NEUTROABS  --  6.1  HGB 11.5* 10.2*  HCT 37.1 33.7*  MCV 103.3* 106.0*  PLT 232 185   Basic Metabolic Panel:  Lab Results  Component Value Date   NA 142 07/11/2024   K 4.0 07/11/2024   CO2 31 07/11/2024   GLUCOSE 125 (H) 07/11/2024   BUN 16 07/11/2024   CREATININE 0.42 (L) 07/11/2024   CALCIUM  9.1 07/11/2024   GFRNONAA >60 07/11/2024   GFRAA >60 08/07/2017   Lipid Panel:  Lab Results  Component Value Date   LDLCALC 103 (H) 08-01-2024   HgbA1c:  Lab Results  Component Value Date   HGBA1C 5.7 (H) 05/12/2024   Urine Drug Screen: No results found for: LABOPIA, COCAINSCRNUR, LABBENZ, AMPHETMU, THCU, LABBARB  Alcohol Level No results found for: [facility] INR  Lab Results  Component Value Date   INR 1.7 (H) 07/10/2024   APTT  Lab  Results  Component Value Date   APTT 63 (H) 07/11/2024     ASSESSMENT  MAZIAH SMOLA is a 78  y.o. female with mild cognitive impairment who presents with AMS and formed visual hallucinations for > 1 week.  - Exam reveals a non-agitated, confused elderly female with poor orientation but intact speech and basic language comprehension. She is actively visually hallucinating a person she calls Todd, but is not distressed by this.  - CT head: No evidence of an acute intracranial abnormality on this motion-degraded study. 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 - MRI brain: Severely compromised by motion artifact. No acute intracranial abnormality. 1.6 cm meningioma overlying the right temporal convexity, with additional 1.7 cm left parafalcine meningioma at the skull vertex. No associated edema or mass effect.  - Impression:  - Acute metabolic encephalopathy in the setting of sepsis. She has lower extremity cellulitis vs an infected wound from chronic venous stasis/heel pressure.  - Formed visual hallucinations. Although family feels that te hallucinations started after starting metoprolol ; this side effect is rare. Could consider switching to an alternate medication for rate control. Of note, the meningiomas seen on her imaging studies are incidental and are unlikely to be related to her hallucinations.  - Diffuse limb weakness, most likely is multifactorial, with her frailty and deconditioning likely playing a role. However, her distal > proximal upper extremity weakness could be secondary to either a central cervical cord lesion, such as a syrinx, versus a chronic polyneuropathy. She also has diffuse BLE weakness, worse proximally, which also is likely due to deconditioning.     RECOMMENDATIONS  - MRI C-spine can be obtained, but given her advanced age and frailty, she would be unlikely to be considered a safe neurosurgical candidate.  - A potential peripheral neuropathy could also be  investigated with EMG/NCS outpatient, but the additional information would be unlikely to result in any treatment that would reverse what appear most likely to be chronic findings.  - Avoid deliriogenic medications and strong antipsychotics if possible. Seroquel  is relatively safe in elderly demented patients, but should be used with caution.  - Maintain an environment that encourages maintenance of a normal diurnal sleep/wake cycle: Lights and TV off at night, lights on and shades open during the day.  - OOB to chair daily if possible.  - Management of her a-fib, hypotension and sepsis per primary team.  - Continue her home Depakote .  - Consider reducing her venlafaxine  dose as this medication can cause hallucinations in the elderly.  ______________________________________________________________________    Bonney SHARK, Kailyn Dubie, MD [facility]

## 2024-07-11 NOTE — Progress Notes (Signed)
 Patient is anxious and irritable. IV pulled out. PRN hydroxyzine given. MD aware.

## 2024-07-11 NOTE — Consult Note (Signed)
 PHARMACY - ANTICOAGULATION CONSULT NOTE  Pharmacy Consult for IV Heparin  Indication: atrial fibrillation and limb ischemia  Patient Measurements: Height: 5' 3 (160 cm) Weight: 59 kg (130 lb) IBW/kg (Calculated) : 52.4 HEPARIN  DW (KG): 59  Labs: Recent Labs    07/10/24 0119 07/10/24 0444 07/10/24 0444 07/11/24 0304 07/11/24 1220 07/11/24 2214  HGB  --  10.2*  --   --   --   --   HCT  --  33.7*  --   --   --   --   PLT  --  185  --   --   --   --   APTT  --  48*   < > 138* 63* 143*  LABPROT  --  20.5*  --   --   --   --   INR  --  1.7*  --   --   --   --   HEPARINUNFRC  --   --   --  >1.10*  --   --   CREATININE 0.37* <0.30*  --   --  0.42*  --    < > = values in this interval not displayed.    Estimated Creatinine Clearance: 47.2 mL/min (A) (by C-G formula based on SCr of 0.42 mg/dL (L)).   Medical History: Past Medical History:  Diagnosis Date   Acute lateral meniscus tear of right knee    Anxiety disorder, unspecified    Atrial fibrillation with rapid ventricular response (HCC) 05/11/2024   Cataract [facility]   Clotting disorder blood circulation/veins in feet and lower legs   Depression 1978   Diverticulosis of colon 04/2005   GERD (gastroesophageal reflux disease)    [facility]   Hypertension    Hyperthyroidism    Dr Damian   Impaired fasting glucose    Low TSH level    chronic low, but euthyroid   Microscopic hematuria    Multiple thyroid  nodules    Persistent atrial fibrillation (HCC)    RBBB    S/p left hip fracture    Sepsis (HCC) 05/14/2024   Sleep disturbance    Subclinical hyperthyroidism    unspecified   Tibia fracture 05/2010   left    Medications:  Apixaban  5 mg BID on hold pending possible procedures  Assessment: Patient is a 78 y/o F with medical history as above and including Afib on Eliquis  who is admitted with limb ischemia complicated by bilateral gangrenous changes. Apixaban  has been placed on hold  pending possible procedures. Pharmacy consulted to dose heparin .  Baseline INR 1.7 and aPTT 48s slightly elevated  Date Time Results Comments 10/17 0304 aPTT 138 supratherapeutic / HL >1.1 (rate 750 u/h) 10/17 1220 aPTT 63 SUB-therapeutic, rate 600 units/hr 10/17 2214 aPTT 143 Supratherapeutic  Goal of Therapy:  Heparin  level 0.3-0.7 units/ml aPTT 66-102 seconds Monitor platelets by anticoagulation protocol: Yes   Plan:  Decrease rate to 650 units/hr Recheck aPTT w/ AM labs Daily CBC and HL per protocol. Follow aPTT until correlating with HL  Rankin CANDIE Dills, PharmD, [facility] 07/11/2024 11:09 PM

## 2024-07-12 DIAGNOSIS — I70223 Atherosclerosis of native arteries of extremities with rest pain, bilateral legs: Secondary | ICD-10-CM | POA: Diagnosis not present

## 2024-07-12 DIAGNOSIS — I4891 Unspecified atrial fibrillation: Secondary | ICD-10-CM | POA: Diagnosis not present

## 2024-07-12 DIAGNOSIS — Z515 Encounter for palliative care: Secondary | ICD-10-CM | POA: Diagnosis not present

## 2024-07-12 DIAGNOSIS — D329 Benign neoplasm of meninges, unspecified: Secondary | ICD-10-CM | POA: Diagnosis not present

## 2024-07-12 DIAGNOSIS — R4182 Altered mental status, unspecified: Secondary | ICD-10-CM

## 2024-07-12 LAB — BASIC METABOLIC PANEL WITH GFR
Anion gap: 15 (ref 5–15)
BUN: 18 mg/dL (ref 8–23)
CO2: 28 mmol/L (ref 22–32)
Calcium: 9.1 mg/dL (ref 8.9–10.3)
Chloride: 97 mmol/L — ABNORMAL LOW (ref 98–111)
Creatinine, Ser: 0.39 mg/dL — ABNORMAL LOW (ref 0.44–1.00)
GFR, Estimated: >60 mL/min (ref >60)
Glucose, Bld: 83 mg/dL (ref 70–99)
Potassium: 4.9 mmol/L (ref 3.5–5.1)
Sodium: 140 mmol/L (ref 135–145)

## 2024-07-12 LAB — DIGOXIN LEVEL: Digoxin Level: 0.8 ng/mL (ref 0.8–2.0)

## 2024-07-12 LAB — MAGNESIUM: Magnesium: 1.9 mg/dL (ref 1.7–2.4)

## 2024-07-12 MED ORDER — DIGOXIN 125 MCG PO TABS
0.1250 mg | ORAL_TABLET | Freq: Every day | ORAL | Status: DC
  Administered 2024-07-12 – 2024-07-13 (×2): 0.125 mg via ORAL
  Filled 2024-07-12 (×3): qty 1

## 2024-07-12 MED ORDER — APIXABAN 5 MG PO TABS
5.0000 mg | ORAL_TABLET | Freq: Two times a day (BID) | ORAL | Status: DC
  Administered 2024-07-12 – 2024-07-13 (×4): 5 mg via ORAL
  Filled 2024-07-12 (×5): qty 1

## 2024-07-12 MED ORDER — METOPROLOL TARTRATE 5 MG/5ML IV SOLN
5.0000 mg | INTRAVENOUS | Status: DC | PRN
  Administered 2024-07-12 – 2024-07-13 (×2): 5 mg via INTRAVENOUS
  Filled 2024-07-12 (×2): qty 5

## 2024-07-12 MED ORDER — SODIUM CHLORIDE 0.9 % IV BOLUS
250.0000 mL | Freq: Once | INTRAVENOUS | Status: AC
  Administered 2024-07-12: 250 mL via INTRAVENOUS

## 2024-07-12 NOTE — Progress Notes (Signed)
 Daily Progress Note   Patient Name: Alexandria Sherman       Date: 07/12/2024 DOB: 10/17/1945  Age: 78 y.o. MRN#: [id number] Attending Physician: Alexandria Burnard LABOR, DO Primary Care Physician: Alexandria Verneita CROME, MD Admit Date: 07/08/2024  Reason for Consultation/Follow-up: Establishing goals of care  HPI/Brief Hospital Review: Alexandria Sherman is a 78 y.o. year old female with past medical history of HTN, HLD, atrial fibrillation w/ RVR , hyperthyroidism, presenting to the ED due to concern for altered mental status and hallucinations. Appears from notes, PCP advised pt to seek care on 10/09 but pt refused along with ischemic changes noted on her feet.   Admitted and being treated for persistent A. Fib--cardiology following, frailty and hypotension precluding further titration and other interventions, lower extremity cellulitis versus wound infection from chronic venous stasis--remains on antibiotic therapy and AMS-reportedly hallucinations started PTA--neurology consulted  Palliative Medicine consulted for assisting with goals of care conversations.  Subjective: Extensive chart review has been completed prior to meeting patient including labs, vital signs, imaging, progress notes, orders, and available advanced directive documents from current and previous encounters.    Visited with [name] at her bedside. Nursing staff assisted [name] to recliner. She reports feeling well today without specific complaints. She remains quite confused, mentions hiding items in her blankets and ordering items from [employer]--visual hallucinations remain. No family or visitors at bedside during time of visit.  Opened up all blinds to promote sleep/wake cycle, helped with simple hygiene.  [name] remains on  IV antibiotics for acute infection. Wound care completed earlier by nursing staff.  Secure message sent to team for disposition clarification. [facility] hospice liaison engaged earlier in week and spoke with sister regarding hospice services in the home. From chart review, it seems [name] had caregivers in her home frequently, will recommend this continue due to AMS and poor functional status--unless consideration for SNF placement is needed based on needs with hospice following.  PMT will continue to follow for ongoing needs and support.  Objective:  Physical Exam Constitutional:      General: She is not in acute distress.    Appearance: She is ill-appearing.  HENT:     Head: Normocephalic.  Pulmonary:     Effort: Pulmonary effort is normal. No respiratory distress.  Abdominal:  General: There is no distension.     Palpations: Abdomen is soft.     Tenderness: There is no abdominal tenderness.  Skin:    General: Skin is warm and dry.  Neurological:     Mental Status: She is alert. She is disoriented.     Motor: Weakness present.  Psychiatric:        Attention and Perception: She perceives visual hallucinations.        Behavior: Behavior is actively hallucinating.             Vital Signs: BP (!) 120/96   Pulse (!) 116   Temp 98.5 F (36.9 C) (Oral)   Resp 20   Ht 5' 3 (1.6 m)   Wt 59 kg   SpO2 95%   BMI 23.03 kg/m  SpO2: SpO2: 95 % O2 Device: O2 Device: Nasal Cannula O2 Flow Rate: O2 Flow Rate (L/min): 4 L/min   Palliative Care Assessment & Plan   Assessment/Recommendation/Plan  Continue with current plan of care Ongoing disposition planning  Thank you for allowing the Palliative Medicine Team to assist in the care of this patient.  Visit includes: Detailed review of medical records (labs, imaging, vital signs), medically appropriate exam (mental status, respiratory, cardiac, skin), discussed with treatment team, counseling and educating patient, family and  staff, documenting clinical information, medication management and coordination of care.  Waddell Lesches, DNP, AGNP-C Palliative Medicine   Please contact Palliative Medicine Team phone at [phone] for questions and concerns.

## 2024-07-12 NOTE — Plan of Care (Signed)

## 2024-07-12 NOTE — Progress Notes (Signed)
 Progress Note   Patient: Alexandria Sherman FMW:[id number] DOB: [birth year] DOA: 07/08/2024     3 DOS: the patient was seen and examined on 07/12/2024   Brief hospital course:  Alexandria Sherman is a 78 y.o. female with a PMH significant for HTN, HLD, paroxsymal atrial fibrillation, hyperthyroidism, venous stasis, and mild cognitive impairment presenting to the ED from the clinic due to concern for altered mental status and hallucinations. Prior to hospitalization, pt was still dependant for ADLs and iADLs but it was exacerbated by her hospitalization. She has been declining per the sister and was recommended hospice by PCP but pt switched PCP.    ED Course: VSS tachycardia with heart rate up to 130s and A-fib RVR, blood pressure stable, requiring 2 L oxygen which is baseline to maintain sats greater than 95%....    Patient was admitted to medicine service for further workup and management as outlined in detail below.   07/11/24 -more hemodynamically stable. Still very disoriented. Appears to be asymptomatic from afib standpoint.   07/12/24 - HR in 110's to 120's.  BP's stable. Requiring 2-4 L/min oxygen.   Assessment and Plan:  Persistent Afib with RVR- limited treatment options given co-morbidities. Better rate control today but still up to 120s on exam today.  10/18: HR remains uncontrolled 110s to 120s --Cardiology following --Continue metoprolol  --Cardiology adding digoxin    Hypotension- this is likely multifactorial with medications side effects, A-fib RVR, and possible sepsis/cellulitis.  BP's now stable --Maintain MAP>65 --Off IV fluids --continue midodrine --diltiazem  was discontinued - avoid pressors if possible given questionable blood flow to LEs.    Lower extremity ischemia ruled out- LE arteries patent on imaging. No intervention required, per vascular surgery consult.    Severe sepsis due to Lower extremity cellulitis vs infected wound from chronic venous  stasis Unstageable bilateral heel pressure injuries Severe sepsis POA evidenced by tachycardia, tachypnea, lactic acidosis consistent with organ dysfunction.  BP's responded to IV fluids and midodrine.  Septic shock ruled out.   - continue IV Abx: aztreonam , flagyl, vancomycin  & de-escalate when clinically improved - f/u cultures- NGTD - WOC consult - appreciate wound care recs - pad and/or float heels - no podiatry intervention required   Hyperthyroidism- TSH normal  - continue home methimazole  and beta-blocker   Chronic hypoxic respiratory failure- endorses 2Lnc is her baseline - at baseline O2 requirement but had observed choking as suspected aspiration when taking morning pills - SLP consulted and made dysphagia 2. No observed aspiration on eval.    Acute delirium  Anxiety- possibly medication side effect. Brain MRI with findings below - hydroxyzine PRN - continue depakote , venlafaxine , seroquel    Meningiomas: Unsure if this is contributing to her hallucinations. Brain MRI: 1.6cm and 1.7cm lesions- negative for evidence of edema or mass effect.  --Neurology consulted, appreciate recs: --Delirium precautions --Avoid sedating meds --OOB to chair daily --Continue Depakote  --Consider reducing Effexor  dose (can cause hallucinations in elderly) --Will defer MRI C-spine as pt would not be candidate for neurosurgical intervention & plan to d/c with hospice  Goals of care --palliative care consulted. Appears plan is to discharge with hospice when medically stable.      Subjective: Patient was up in recliner when seen on rounds this morning.  Phlebotomist and IV RN were not able to draw patient's labs on multiple attempts this morning.  Patient is minimally interactive but awake, mostly nonverbal for me.  She denies acute complaints including fevers or chills, pain or nausea vomiting.  Also denies chest pain palpitations or shortness of breath.  Physical Exam: Vitals:   07/12/24  1134 07/12/24 1247 07/12/24 1347 07/12/24 1510  BP: 129/89  (!) 120/96 132/84  Pulse: (!) 116   (!) 124  Resp: 17 (!) 26 20 (!) 24  Temp: 98.5 F (36.9 C)     TempSrc: Oral     SpO2: 90% 95% 95% 95%  Weight:      Height:       General exam: awake, alert, no acute distress, chronically ill-appearing HEENT: moist mucus membranes, hearing grossly normal  Respiratory system: CTAB generally diminished, no wheezes, rales or rhonchi, normal respiratory effort. Cardiovascular system: normal S1/S2, irregularly irregular Gastrointestinal system: soft, NT, ND, no HSM felt, +bowel sounds. Central nervous system: Exam limited by patient minimally interactive and nonverbal. no gross focal neurologic deficits Extremities: Severe bilateral lower extremity venous stasis hyperpigmentation and skin flaking, padded dressings of bilateral heels    Data Reviewed:  Notable labs: Chloride 97, creatinine 0.39 otherwise normal BMP  10/16 blood cultures negative to date  Family Communication: None present at bedside, will attempt to call as time allows  Disposition: Status is: Inpatient Remains inpatient appropriate because: Remains on IV antibiotics and and A-fib RVR with heart rate not yet controlled    Planned Discharge Destination: Hospice    Time spent: 45 minutes  Author: Burnard DELENA Cunning, DO 07/12/2024 3:19 PM  For on call review [email or web address].

## 2024-07-12 NOTE — Progress Notes (Signed)
 Progress Note  Patient Name: Alexandria Sherman Date of Encounter: 07/12/2024  Primary Cardiologist: Darron  Subjective   Denies chest pain or dyspnea.  Remains in A-fib with ventricular rates in the 90s to low 100s  bpm with paroxysms into the 140s bpm with aberrancy.     Inpatient Medications    Scheduled Meds:  divalproex   125 mg Oral BID   feeding supplement  237 mL Oral BID BM   latanoprost   1 drop Both Eyes QHS   methimazole   2.5 mg Oral Daily   metoprolol  tartrate  25 mg Oral BID   midodrine  10 mg Oral TID WC   pantoprazole   40 mg Oral Daily   QUEtiapine   12.5 mg Oral QPC supper   timolol   1 drop Both Eyes Daily   venlafaxine   75 mg Oral BID WC   Continuous Infusions:  aztreonam  1 g (07/11/24 2331)   heparin  650 Units/hr (07/12/24 0300)   metronidazole 500 mg (07/12/24 0531)   vancomycin  1,000 mg (07/12/24 0634)   PRN Meds: acetaminophen  **OR** acetaminophen , haloperidol lactate, hydrOXYzine   Vital Signs    Vitals:   07/11/24 2238 07/11/24 2327 07/12/24 0417 07/12/24 0739  BP: (!) 157/97 (!) 148/93 116/82 115/89  Pulse:  85 83 (!) 113  Resp:  18 19 20   Temp:  97.9 F (36.6 C) 97.8 F (36.6 C) 98.2 F (36.8 C)  TempSrc:  Oral Oral Oral  SpO2:  99% 96% 97%  Weight:      Height:        Intake/Output Summary (Last 24 hours) at 07/12/2024 0751 Last data filed at 07/12/2024 0600 Gross per 24 hour  Intake 697.87 ml  Output 200 ml  Net 497.87 ml   Filed Weights   07/08/24 1725  Weight: 59 kg    Telemetry    A-fib with ventricular rates in the 90s to low 100s  bpm with paroxysms into the 140s bpm with aberrancy - Personally Reviewed  ECG    No new tracings - Personally Reviewed  Physical Exam   GEN: Elderly, frail, and chronically ill-appearing.   Neck: No JVD. Cardiac: Irregularly irregular, no murmurs, rubs, or gallops.  Respiratory: Clear to auscultation bilaterally.  GI: Soft, nontender, non-distended.   MS: Trace bilateral pretibial  edema with hyperpigmentation. Neuro:  Alert, intermittently confused; Nonfocal.  Psych: Normal affect.  Labs    Chemistry Recent Labs  Lab 07/08/24 2100 07/10/24 0119 07/10/24 0444 07/11/24 1220  NA  --  141 143 142  K  --  3.1* 3.1* 4.0  CL  --  93* 92* 95*  CO2  --  32 36* 31  GLUCOSE  --  103* 105* 125*  BUN  --  16 16 16   CREATININE  --  0.37* <0.30* 0.42*  CALCIUM   --  8.0* 8.3* 9.1  PROT 7.0  --  5.9*  --   ALBUMIN 3.0*  --  2.6*  --   AST 19  --  16  --   ALT 11  --  10  --   ALKPHOS 67  --  56  --   BILITOT 0.8  --  0.8  --   GFRNONAA  --  >60 NOT CALCULATED >60  ANIONGAP  --  16* 15 16*     Hematology Recent Labs  Lab 07/08/24 1733 07/10/24 0444  WBC 10.7* 8.4  RBC 3.59* 3.18*  HGB 11.5* 10.2*  HCT 37.1 33.7*  MCV 103.3* 106.0*  MCH 32.0 32.1  MCHC 31.0 30.3  RDW 15.1 15.2  PLT 232 185    Cardiac EnzymesNo results for input(s): TROPONINI in the last 168 hours. No results for input(s): TROPIPOC in the last 168 hours.   BNP Recent Labs  Lab 07/08/24 1733  BNP 479.3*     DDimer No results for input(s): DDIMER in the last 168 hours.   Radiology    DG Chest 1 View Result Date: 07/10/2024 IMPRESSION: 1. Low lung volumes with mild bibasilar atelectasis. 2. Possible small left pleural effusion. Electronically signed by: Pinkie Pebbles MD 07/10/2024 03:56 AM EDT RP Workstation: [id number]   MR BRAIN WO CONTRAST Result Date: 07/09/2024 IMPRESSION: 1. No acute intracranial abnormality. 2. 1.6 cm meningioma overlying the right temporal convexity, with additional 1.7 cm left parafalcine meningioma at the skull vertex. No associated edema or mass effect. Who Electronically signed by: Morene Hoard MD 07/09/2024 12:49 AM EDT RP Workstation: [id number]   DG Chest Port 1 View Result Date: 07/09/2024 IMPRESSION: 1. Expiratory film. 2. Consolidation or atelectasis in the hypoinflated bases. 3. Small pleural effusions, possibly chronic. 4.  Recommend repeat chest radiographs in full inspiration, preferably PA and lateral views if possible. Electronically signed by: Francis Quam MD 07/09/2024 12:41 AM EDT RP Workstation: [id number]   CT Head Wo Contrast Result Date: 07/08/2024 IMPRESSION: 1. No evidence of an acute intracranial abnormality on this motion-degraded study. 2. 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 Electronically signed by: Dasie Hamburg MD 07/08/2024 06:22 PM EDT RP Workstation: [id number]    Cardiac Studies   2D echo 05/11/2024:  1. Left ventricular ejection fraction, by estimation, is 50 to 55%. The  left ventricle has low normal function. The left ventricle has no regional  wall motion abnormalities. Left ventricular diastolic parameters are  indeterminate.   2. Right ventricular systolic function is normal. The right ventricular  size is normal.   3. The mitral valve is normal in structure. Mild mitral valve  regurgitation.   4. The aortic valve is tricuspid. Aortic valve regurgitation is not  visualized.   5. The inferior vena cava is normal in size with greater than 50%  respiratory variability, suggesting right atrial pressure of 3 mmHg.   Patient Profile     78 y.o. female with history of coronary artery calcifications on CT imaging, recently diagnosed A-fib, chronic sinus tachycardia, hypertension, hyperlipidemia, chronic venous insufficiency/lymphedema, anxiety, depression, GERD, and hyperthyroidism (diagnosed August 2025) who was admitted with encephalopathy secondary to sepsis secondary to calcaneal pressure ulcers and we are seeing for A-fib with RVR.  Assessment & Plan    1. Persistent Afib with RVR: -Elevated ventricular rates in the setting of acute infection with associated sepsis and relative hypotension  -Ventricular rates are improved in the 90s to low 100s bpm currently with paroxysms into the 140s bpm, will tolerate overall rates less than 110 bpm -Overall, rate control options are  limited with amiodarone not ideal with underlying hyperthyroidism and hypotension precluding escalation of AV nodal blocking medication -Received digoxin  0.25 mg on 10/16 with a level of 0.8 today -May need to start low dose digoxin  0.125 mg daily for added rate control now that potassium is repleted  -Continue metoprolol  25 mg twice daily, relative hypotension requiring midodrine precludes titration  -Heparin  gtt with recommendation to resume PTA Eliquis  5 mg bid prior to discharge -Underlying frailty, DNR status, and likelihood for recurrent admissions/infections are barriers to cardioversion   2. Hypotension: -Requiring midodrine -Suspect elevated BP readings are in the  setting of supine hypertension associated with midodrine  -Needs to sit up with midodrine   3. Hypokalemia: -Repleted  4. Hyperthyroidism: -TSH normal -Remains on methimazole       For questions or updates, please contact [facility] Please consult [email or web address] for contact info under Cardiology/STEMI.    Signed, Bernardino Bring, PA-C [facility] Pager: [phone] 07/12/2024, 7:51 AM

## 2024-07-12 NOTE — Progress Notes (Signed)
 [facility] Liaison Note   Follow up on outpatient palliative care patient with [facility]- which is now a new hospice referral.   Hospital liaison team continues to follow through final discharge disposition.  Continued communication and collaboration with the hospital medical team.   Please do not hesitate to call with any hospice related questions or concerns. No discharge Plans today.   Saddie HILARIO Na, RN Nurse Liaison [phone]

## 2024-07-13 ENCOUNTER — Inpatient Hospital Stay

## 2024-07-13 DIAGNOSIS — L97519 Non-pressure chronic ulcer of other part of right foot with unspecified severity: Secondary | ICD-10-CM | POA: Diagnosis not present

## 2024-07-13 DIAGNOSIS — D329 Benign neoplasm of meninges, unspecified: Secondary | ICD-10-CM | POA: Diagnosis not present

## 2024-07-13 DIAGNOSIS — Z515 Encounter for palliative care: Secondary | ICD-10-CM | POA: Diagnosis not present

## 2024-07-13 DIAGNOSIS — I4891 Unspecified atrial fibrillation: Secondary | ICD-10-CM | POA: Diagnosis not present

## 2024-07-13 DIAGNOSIS — I70223 Atherosclerosis of native arteries of extremities with rest pain, bilateral legs: Secondary | ICD-10-CM | POA: Diagnosis not present

## 2024-07-13 DIAGNOSIS — I4811 Longstanding persistent atrial fibrillation: Secondary | ICD-10-CM | POA: Diagnosis not present

## 2024-07-13 DIAGNOSIS — Z711 Person with feared health complaint in whom no diagnosis is made: Secondary | ICD-10-CM

## 2024-07-13 LAB — BASIC METABOLIC PANEL WITH GFR
Anion gap: 11 (ref 5–15)
BUN: 24 mg/dL — ABNORMAL HIGH (ref 8–23)
CO2: 32 mmol/L (ref 22–32)
Calcium: 9.3 mg/dL (ref 8.9–10.3)
Chloride: 98 mmol/L (ref 98–111)
Creatinine, Ser: 0.53 mg/dL (ref 0.44–1.00)
GFR, Estimated: >60 mL/min (ref >60)
Glucose, Bld: 93 mg/dL (ref 70–99)
Potassium: 4.1 mmol/L (ref 3.5–5.1)
Sodium: 141 mmol/L (ref 135–145)

## 2024-07-13 LAB — BRAIN NATRIURETIC PEPTIDE: B Natriuretic Peptide: 701.7 pg/mL — ABNORMAL HIGH (ref 0.0–100.0)

## 2024-07-13 LAB — LACTIC ACID, PLASMA: Lactic Acid, Venous: 1.6 mmol/L (ref 0.5–1.9)

## 2024-07-13 MED ORDER — SODIUM CHLORIDE 0.9 % IV SOLN
2.0000 g | INTRAVENOUS | Status: DC
  Administered 2024-07-13: 2 g via INTRAVENOUS
  Filled 2024-07-13 (×2): qty 20

## 2024-07-13 MED ORDER — FUROSEMIDE 10 MG/ML IJ SOLN
20.0000 mg | Freq: Once | INTRAMUSCULAR | Status: AC
  Administered 2024-07-13: 20 mg via INTRAVENOUS
  Filled 2024-07-13: qty 2

## 2024-07-13 NOTE — Progress Notes (Signed)
 Patient screaming for help, when asked how can I help you the patient stated that she she needs to get up to go help the teachers with the student.down stairs. Patient educated on condition are reason for hospitalization. Patient unable to redirect. PRN given for agitation.

## 2024-07-13 NOTE — Plan of Care (Signed)

## 2024-07-13 NOTE — TOC Initial Note (Signed)
 Transition of Care [facility]) - Initial/Assessment Note    Patient Details  Name: Alexandria Sherman MRN: [id number] Date of Birth: 1946-08-03  [facility] CM/SW Contact:    Victory Jackquline GORMAN, RN Phone Number: 07/13/2024, 5:04 PM  Clinical Narrative:   RNCM received a secure chat from NP with concerns for patient going home with hospice. I spoke to the patient's sister [name] @ [phone] and she is in agreement to looking at [facility] in the area for her sister. She said that her sister lives alone and has caregivers for 8 hours a Damiyah Ditmars from 8am to 12:30pm then fron 4pm to 8:30pm. She has a hospital bed, [employer], and [employer]. Advised that I will submit her sister for bed offers and let her know which facilities accept her and let her know so that she can decide which one she would like for her sister to go to.   PASRR# [id number] A, FL2 completed and submitted for bed offers. RNCM will continue to follow discharge for planning/care coordination and update as applicable.      Expected Discharge Plan: Skilled Nursing Facility Barriers to Discharge: Continued Medical Work up   Patient Goals and CMS Choice            Expected Discharge Plan and Services     Post Acute Care Choice: Skilled Nursing Facility Living arrangements for the past 2 months: Single Family Home                                      Prior Living Arrangements/Services Living arrangements for the past 2 months: Single Family Home Lives with:: Self Patient language and need for interpreter reviewed:: Yes Do you feel safe going back to the place where you live?: Yes      Need for Family Participation in Patient Care: Yes (Comment) Care giver support system in place?: Yes (comment) Current home services: DME, Homehealth aide Criminal Activity/Legal Involvement Pertinent to Current Situation/Hospitalization: No - Comment as needed  Activities of Daily Living   ADL Screening (condition at time of  admission) Independently performs ADLs?: No Does the patient have a NEW difficulty with bathing/dressing/toileting/self-feeding that is expected to last >3 days?: Yes (Initiates electronic notice to provider for possible OT consult) Does the patient have a NEW difficulty with getting in/out of bed, walking, or climbing stairs that is expected to last >3 days?: Yes (Initiates electronic notice to provider for possible PT consult) Does the patient have a NEW difficulty with communication that is expected to last >3 days?: Yes (Initiates electronic notice to provider for possible SLP consult) Is the patient deaf or have difficulty hearing?: No Does the patient have difficulty seeing, even when wearing glasses/contacts?: No Does the patient have difficulty concentrating, remembering, or making decisions?: Yes  Permission Sought/Granted                  Emotional Assessment Appearance:: Appears stated age     Orientation: : Oriented to Self   Psych Involvement: No (comment)  Admission diagnosis:  Meningioma (HCC) [D32.9] PAD (peripheral artery disease) [I73.9] Atrial fibrillation, unspecified type (HCC) [I48.91] Ischemic ulcer of both feet, unspecified ulcer stage (HCC) [L97.519, L97.529] Critical limb ischemia of both lower extremities (HCC) [I70.223] Patient Active Problem List   Diagnosis Date Noted   Critical limb ischemia of both lower extremities (HCC) 07/09/2024   Hyperlipidemia 07/09/2024   Meningioma (HCC)  07/09/2024   Ischemic ulcer of both feet (HCC) 07/09/2024   Altered mental status 07/08/2024   Failure to thrive syndrome, adult 07/08/2024   Hospital discharge follow-up 05/24/2024   Confusion 05/21/2024   Acute heart failure with preserved ejection fraction (HCC) 05/15/2024   Hypokalemia 05/14/2024   Shortness of breath 05/14/2024   Atrial fibrillation (HCC) 05/10/2024   Chronic atrial fibrillation (HCC) 05/10/2024   DNR (do not resuscitate) discussion 04/03/2024    Lymphedema 04/03/2024   Cellulitis of foot, right 03/11/2024   Chronic diastolic CHF (congestive heart failure) (HCC) 01/17/2022   Generalized weakness 12/06/2021   Chronic venous insufficiency 11/10/2019   PAD (peripheral artery disease) 11/04/2018   Malnutrition of mild degree 01/21/2018   Edema of extremities 08/07/2017   Advance directive discussed with patient 10/13/2014   Female stress incontinence 04/02/2013   Weakness 03/18/2013   Neuropathy (HCC) 08/01/2012   Hyperthyroidism    Routine general medical examination at a health care facility 05/09/2011   Episodic mood disorder 12/19/2006   Essential hypertension 12/19/2006   RBBB 12/19/2006   ALLERGIC RHINITIS 12/19/2006   GERD 12/19/2006   DIVERTICULOSIS, COLON 12/19/2006   PCP:  Marylynn Verneita CROME, MD Pharmacy:   [facility] - [place], [place] - [address] [place] [address] Phone: [phone] Fax: [phone]     Social Drivers of Health (SDOH) Social History: SDOH Screenings   Food Insecurity: No Food Insecurity (07/09/2024)  Recent Concern: Food Insecurity - Food Insecurity Present (05/10/2024)  Housing: Low Risk  (07/09/2024)  Transportation Needs: No Transportation Needs (07/09/2024)  Utilities: Not At Risk (07/09/2024)  Alcohol Screen: Low Risk  (03/31/2024)  Depression (PHQ2-9): Medium Risk (07/08/2024)  Financial Resource Strain: Low Risk  (03/31/2024)  Physical Activity: Inactive (03/31/2024)  Social Connections: Socially Isolated (07/09/2024)  Stress: Stress Concern Present (03/31/2024)  Tobacco Use: Low Risk  (07/08/2024)  Health Literacy: Adequate Health Literacy (03/31/2024)   SDOH Interventions:     Readmission Risk Interventions     No data to display

## 2024-07-13 NOTE — Progress Notes (Addendum)
 Progress Note   Patient: Alexandria Sherman FMW:[id number] DOB: 09/17/1946 DOA: 07/08/2024     4 DOS: the patient was seen and examined on 07/13/2024   Brief hospital course:  Alexandria Sherman is a 78 y.o. female with a PMH significant for HTN, HLD, paroxsymal atrial fibrillation, hyperthyroidism, venous stasis, and mild cognitive impairment presenting to the ED from the clinic due to concern for altered mental status and hallucinations. Prior to hospitalization, pt was still dependant for ADLs and iADLs but it was exacerbated by her hospitalization. She has been declining per the sister and was recommended hospice by PCP but pt switched PCP.    ED Course: VSS tachycardia with heart rate up to 130s and A-fib RVR, blood pressure stable, requiring 2 L oxygen which is baseline to maintain sats greater than 95%....    Patient was admitted to medicine service for further workup and management as outlined in detail below.   07/11/24 -more hemodynamically stable. Still very disoriented. Appears to be asymptomatic from afib standpoint.   07/12/24 - HR in 110's to 120's.  BP's stable. Requiring 2-4 L/min oxygen.  07/13/24 -- HR's in 100's to 120's. O2 up to 5 L/min.  BNP elevated.  CXR repeated - very low lung volumes and bibasilar collapse vs consolidation L>R.  IV Lasix  20 mg x 1   Assessment and Plan:  Persistent Afib with RVR- limited treatment options given co-morbidities. Better rate control today but still up to 120s on exam today.  10/18: HR remains uncontrolled 110s to 120s --Cardiology following --Continue metoprolol  --Started on digoxin  --IV metoprolol  PRN   Hypotension- this is likely multifactorial with medications side effects, A-fib RVR, and possible sepsis/cellulitis.  BP's now stable --Maintain MAP>65 --Off IV fluids --continue midodrine --diltiazem  was discontinued - avoid pressors if possible given questionable blood flow to LEs.    Lower extremity ischemia ruled out-  LE arteries patent on imaging. No intervention required, per vascular surgery consult.    Severe sepsis due to Lower extremity cellulitis vs infected wound from chronic venous stasis Unstageable bilateral heel pressure injuries Severe sepsis POA evidenced by tachycardia, tachypnea, lactic acidosis consistent with organ dysfunction.  BP's responded to IV fluids and midodrine.  Septic shock ruled out.   - continue IV Abx: change aztreonam  >> ceftriaxone, continue flagyl, vancomycin  & de-escalate when clinically improved - f/u cultures- NGTD - WOC consult - appreciate wound care recs - pad and/or float heels - no podiatry intervention required   Hyperthyroidism- TSH normal  - continue home methimazole  and beta-blocker   Acute on Chronic hypoxic respiratory failure-  2L/min baseline O2 requirement but had observed choking as suspected aspiration when taking morning pills Now needing 5 L/min oxygen Intermittent dyspnea in setting of a-fib RVR - SLP consulted and made dysphagia 2. No observed aspiration on eval.  PLAN --Check chest xray given rising O2 need --Check BNP - elevated > 700 --Single dose 20 mg IV Lasix  & monitor response   Acute delirium with hallucinations  Anxiety- possibly medication side effect, hospital environment.  Brain MRI with findings below - hydroxyzine PRN - continue depakote , venlafaxine , seroquel    Meningiomas: Per Neurology, would not explain her hallucinations. Brain MRI: 1.6cm and 1.7cm lesions- negative for evidence of edema or mass effect.  --Neurology consulted, appreciate recs: --Delirium precautions --Avoid sedating meds --OOB to chair daily --Continue Depakote  --Consider reducing Effexor  dose (can cause hallucinations in elderly) --Will defer MRI C-spine as pt would not be candidate for neurosurgical intervention & plan  to d/c with hospice  Goals of care --palliative care consulted. Plan is to discharge with hospice when medically stable.  Venue  is TBD - home w caregiver support vs facility.      Subjective: Patient was up in recliner when seen on rounds this morning.  More awake and alert than yesterday. Denies feeling short of breath.  She says she gets anxious here at times.  No other complaints.  Physical Exam: Vitals:   07/13/24 0713 07/13/24 0850 07/13/24 1000 07/13/24 1200  BP: 95/74 (!) 121/50 104/76 (!) 94/56  Pulse: 99 67 88 77  Resp: 19 (!) 38 (!) 34   Temp:  98.3 F (36.8 C)    TempSrc:  Oral  Oral  SpO2: 96% 95% 95% 95%  Weight:      Height:       General exam: awake, alert, no acute distress, chronically ill-appearing HEENT: moist mucus membranes, hearing grossly normal  Respiratory system: CTAB generally diminished with very poor inspiratory volumes, no wheezes or rhonchi, normal respiratory effort. On 5 L/min [place] O2 Cardiovascular system: normal S1/S2, irregularly irregular, no edema Gastrointestinal system: soft, NT, ND, no HSM felt, +bowel sounds. Central nervous system: more awake & interactive, A&O to self and hospital, normal speech, no gross focal deficits Extremities: Severe bilateral lower extremity venous stasis hyperpigmentation and skin flaking, padded dressings of bilateral heels    Data Reviewed:  Notable labs: BUN 24  otherwise normal BMP BNP 701.7 Normal lactic acid 1.6  10/16 blood cultures negative to date  Family Communication: None present at bedside, will attempt to call as time allows  Disposition: Status is: Inpatient Remains inpatient appropriate because: Remains on IV antibiotics and and A-fib RVR with heart rate not yet controlled    Planned Discharge Destination: Hospice    Time spent: 45 minutes  Author: Burnard DELENA Cunning, DO 07/13/2024 6:16 PM  For on call review [email or web address].

## 2024-07-13 NOTE — Progress Notes (Signed)
 [facility] Liaison Note   Follow up on outpatient palliative care patient with [facility]- which is now a new hospice referral.   Hospital liaison team continues to follow through final discharge disposition.  Continued communication and collaboration with the hospital medical team.   Please do not hesitate to call with any hospice related questions or concerns. No discharge Plans today.   Saddie HILARIO Na, RN Nurse Liaison [phone]

## 2024-07-13 NOTE — NC FL2 (Signed)
 [place]  MEDICAID FL2 LEVEL OF CARE FORM     IDENTIFICATION  Patient Name: Alexandria Sherman Birthdate: 1946/09/08 Sex: female Admission Date (Current Location): 07/08/2024  [facility] and [place] Number:  Chiropodist and Address:         Provider Number: [id number]  Attending Physician Name and Address:  Fausto Burnard LABOR, DO  Relative Name and Phone Number:  [name]: [phone]    Current Level of Care: Hospital Recommended Level of Care: Skilled Nursing Facility Prior Approval Number:    Date Approved/Denied:   PASRR Number: [id number]  Discharge Plan: SNF    Current Diagnoses: Patient Active Problem List   Diagnosis Date Noted   Critical limb ischemia of both lower extremities (HCC) 07/09/2024   Hyperlipidemia 07/09/2024   Meningioma (HCC) 07/09/2024   Ischemic ulcer of both feet (HCC) 07/09/2024   Altered mental status 07/08/2024   Failure to thrive syndrome, adult 07/08/2024   Hospital discharge follow-up 05/24/2024   Confusion 05/21/2024   Acute heart failure with preserved ejection fraction (HCC) 05/15/2024   Hypokalemia 05/14/2024   Shortness of breath 05/14/2024   Atrial fibrillation (HCC) 05/10/2024   Chronic atrial fibrillation (HCC) 05/10/2024   DNR (do not resuscitate) discussion 04/03/2024   Lymphedema 04/03/2024   Cellulitis of foot, right 03/11/2024   Chronic diastolic CHF (congestive heart failure) (HCC) 01/17/2022   Generalized weakness 12/06/2021   Chronic venous insufficiency 11/10/2019   PAD (peripheral artery disease) 11/04/2018   Malnutrition of mild degree 01/21/2018   Edema of extremities 08/07/2017   Advance directive discussed with patient 10/13/2014   Female stress incontinence 04/02/2013   Weakness 03/18/2013   Neuropathy (HCC) 08/01/2012   Hyperthyroidism    Routine general medical examination at a health care facility 05/09/2011   Episodic mood disorder 12/19/2006   Essential hypertension 12/19/2006   RBBB  12/19/2006   ALLERGIC RHINITIS 12/19/2006   GERD 12/19/2006   DIVERTICULOSIS, COLON 12/19/2006    Orientation RESPIRATION BLADDER Height & Weight     Self  Normal Incontinent Weight: 59 kg Height:  5' 3 (160 cm)  BEHAVIORAL SYMPTOMS/MOOD NEUROLOGICAL BOWEL NUTRITION STATUS      Incontinent Diet  AMBULATORY STATUS COMMUNICATION OF NEEDS Skin   Total Care   Other (Comment) (Pressure Injury to BL Heels and Vascular Ulcer to Right Toe)                       Personal Care Assistance Level of Assistance  Total care       Total Care Assistance: Maximum assistance   Functional Limitations Info             SPECIAL CARE FACTORS FREQUENCY  PT (By licensed PT), OT (By licensed OT)     PT Frequency: 3 X/Week OT Frequency: 3 X/Week            Contractures Contractures Info: Not present    Additional Factors Info  Code Status Code Status Info: DNR             Current Medications (07/13/2024):  This is the current hospital active medication list Current Facility-Administered Medications  Medication Dose Route Frequency Provider Last Rate Last Admin   acetaminophen  (TYLENOL ) tablet 650 mg  650 mg Oral Q6H PRN Khan, Ghalib, MD   650 mg at 07/12/24 2205   Or   acetaminophen  (TYLENOL ) suppository 650 mg  650 mg Rectal Q6H PRN Khan, Ghalib, MD       apixaban  (ELIQUIS ) tablet  5 mg  5 mg Oral BID Abigail Motto M, PA-C   5 mg at 07/13/24 0932   cefTRIAXone (ROCEPHIN) 2 g in sodium chloride  0.9 % 100 mL IVPB  2 g Intravenous Q24H Fausto Sor A, DO 200 mL/hr at 07/13/24 1724 Infusion Verify at 07/13/24 1724   digoxin  (LANOXIN ) tablet 0.125 mg  0.125 mg Oral Daily Abigail Motto M, PA-C   0.125 mg at 07/13/24 9063   divalproex  (DEPAKOTE ) DR tablet 125 mg  125 mg Oral BID Khan, Ghalib, MD   125 mg at 07/13/24 0935   feeding supplement (ENSURE PLUS HIGH PROTEIN) liquid 237 mL  237 mL Oral BID BM Lenon Pons L, MD   237 mL at 07/13/24 1600   haloperidol lactate (HALDOL)  injection 1-2 mg  1-2 mg Intramuscular Q6H PRN Mansy, Jan A, MD   1 mg at 07/10/24 1051   hydrOXYzine (ATARAX) tablet 25 mg  25 mg Oral TID PRN Anderson, Chelsey L, MD   25 mg at 07/12/24 1852   latanoprost  (XALATAN ) 0.005 % ophthalmic solution 1 drop  1 drop Both Eyes QHS Lenon Pons CROME, MD   1 drop at 07/12/24 2210   methIMAzole  (TAPAZOLE ) tablet 2.5 mg  2.5 mg Oral Daily Kandis Devaughn Sayres, MD   2.5 mg at 07/13/24 0936   metoprolol  tartrate (LOPRESSOR ) injection 5 mg  5 mg Intravenous Q2H PRN Fausto Sor A, DO   5 mg at 07/13/24 1006   metoprolol  tartrate (LOPRESSOR ) tablet 25 mg  25 mg Oral BID Gollan, Timothy J, MD   25 mg at 07/13/24 0932   metroNIDAZOLE (FLAGYL) IVPB 500 mg  500 mg Intravenous Q12H Dail Rankin RAMAN, RPH 100 mL/hr at 07/13/24 1724 Infusion Verify at 07/13/24 1724   midodrine (PROAMATINE) tablet 10 mg  10 mg Oral TID WC Mansy, Jan A, MD   10 mg at 07/13/24 1623   pantoprazole  (PROTONIX ) EC tablet 40 mg  40 mg Oral Daily Khan, Ghalib, MD   40 mg at 07/13/24 0932   QUEtiapine  (SEROQUEL ) tablet 12.5 mg  12.5 mg Oral QPC supper Khan, Ghalib, MD   12.5 mg at 07/13/24 1624   timolol  (TIMOPTIC ) 0.5 % ophthalmic solution 1 drop  1 drop Both Eyes Daily Wouk, Noah Bedford, MD   1 drop at 07/13/24 0934   vancomycin  (VANCOCIN ) IVPB 1000 mg/200 mL premix  1,000 mg Intravenous Q24H Dail Rankin RAMAN, RPH   Stopped at 07/13/24 9373   venlafaxine  (EFFEXOR ) tablet 75 mg  75 mg Oral BID WC Khan, Ghalib, MD   75 mg at 07/13/24 1623     Discharge Medications: Please see discharge summary for a list of discharge medications.  Relevant Imaging Results:  Relevant Lab Results:   Additional Information    Victory Jackquline RAMAN, RN

## 2024-07-13 NOTE — Progress Notes (Signed)
 Progress Note  Patient Name: Alexandria Sherman Date of Encounter: 07/13/2024  Primary Cardiologist: Darron  Subjective   Denies chest pain, dyspnea, or palpitations.  Remains in A-fib with ventricular rates in the 90s to low 100s  bpm with brief paroxysms into the 130s bpm with aberrancy.    Inpatient Medications    Scheduled Meds:  apixaban   5 mg Oral BID   digoxin   0.125 mg Oral Daily   divalproex   125 mg Oral BID   feeding supplement  237 mL Oral BID BM   latanoprost   1 drop Both Eyes QHS   methimazole   2.5 mg Oral Daily   metoprolol  tartrate  25 mg Oral BID   midodrine  10 mg Oral TID WC   pantoprazole   40 mg Oral Daily   QUEtiapine   12.5 mg Oral QPC supper   timolol   1 drop Both Eyes Daily   venlafaxine   75 mg Oral BID WC   Continuous Infusions:  aztreonam  1 g (07/12/24 2233)   metronidazole 500 mg (07/13/24 0516)   vancomycin  1,000 mg (07/13/24 0524)   PRN Meds: acetaminophen  **OR** acetaminophen , haloperidol lactate, hydrOXYzine, metoprolol  tartrate   Vital Signs    Vitals:   07/12/24 1903 07/12/24 1954 07/13/24 0019 07/13/24 0444  BP:  (!) 127/111 (!) 121/94 99/71  Pulse: (!) 116 99 92 95  Resp: (!) 23 20 20  (!) 22  Temp:  (!) 97.3 F (36.3 C) 97.7 F (36.5 C) 98.2 F (36.8 C)  TempSrc:      SpO2: 92% 91% 97% 91%  Weight:      Height:        Intake/Output Summary (Last 24 hours) at 07/13/2024 9192 Last data filed at 07/12/2024 1957 Gross per 24 hour  Intake 897.69 ml  Output 100 ml  Net 797.69 ml   Filed Weights   07/08/24 1725  Weight: 59 kg    Telemetry    A-fib with ventricular rates in the 90s bpm for the most part with paroxysms into the 130s bpm with aberrancy - Personally Reviewed  ECG    No new tracings - Personally Reviewed  Physical Exam   GEN: Elderly, frail, and chronically ill-appearing.   Neck: No JVD. Cardiac: Irregularly irregular, no murmurs, rubs, or gallops.  Respiratory: Clear to auscultation bilaterally.  GI:  Soft, nontender, non-distended.   MS: Trace bilateral pretibial edema with hyperpigmentation. Neuro:  Alert, intermittently confused; Nonfocal.  Psych: Normal affect.  Labs    Chemistry Recent Labs  Lab 07/08/24 2100 07/10/24 0119 07/10/24 0444 07/11/24 1220 07/12/24 0518 07/13/24 0434  NA  --    < > 143 142 140 141  K  --    < > 3.1* 4.0 4.9 4.1  CL  --    < > 92* 95* 97* 98  CO2  --    < > 36* 31 28 32  GLUCOSE  --    < > 105* 125* 83 93  BUN  --    < > 16 16 18  24*  CREATININE  --    < > <0.30* 0.42* 0.39* 0.53  CALCIUM   --    < > 8.3* 9.1 9.1 9.3  PROT 7.0  --  5.9*  --   --   --   ALBUMIN 3.0*  --  2.6*  --   --   --   AST 19  --  16  --   --   --   ALT 11  --  10  --   --   --   ALKPHOS 67  --  56  --   --   --   BILITOT 0.8  --  0.8  --   --   --   GFRNONAA  --    < > NOT CALCULATED >60 >60 >60  ANIONGAP  --    < > 15 16* 15 11   < > = values in this interval not displayed.     Hematology Recent Labs  Lab 07/08/24 1733 07/10/24 0444  WBC 10.7* 8.4  RBC 3.59* 3.18*  HGB 11.5* 10.2*  HCT 37.1 33.7*  MCV 103.3* 106.0*  MCH 32.0 32.1  MCHC 31.0 30.3  RDW 15.1 15.2  PLT 232 185    Cardiac EnzymesNo results for input(s): TROPONINI in the last 168 hours. No results for input(s): TROPIPOC in the last 168 hours.   BNP Recent Labs  Lab 07/08/24 1733  BNP 479.3*     DDimer No results for input(s): DDIMER in the last 168 hours.   Radiology    DG Chest 1 View Result Date: 07/10/2024 IMPRESSION: 1. Low lung volumes with mild bibasilar atelectasis. 2. Possible small left pleural effusion. Electronically signed by: Pinkie Pebbles MD 07/10/2024 03:56 AM EDT RP Workstation: [id number]   MR BRAIN WO CONTRAST Result Date: 07/09/2024 IMPRESSION: 1. No acute intracranial abnormality. 2. 1.6 cm meningioma overlying the right temporal convexity, with additional 1.7 cm left parafalcine meningioma at the skull vertex. No associated edema or mass effect. Who  Electronically signed by: Morene Hoard MD 07/09/2024 12:49 AM EDT RP Workstation: [id number]   DG Chest Port 1 View Result Date: 07/09/2024 IMPRESSION: 1. Expiratory film. 2. Consolidation or atelectasis in the hypoinflated bases. 3. Small pleural effusions, possibly chronic. 4. Recommend repeat chest radiographs in full inspiration, preferably PA and lateral views if possible. Electronically signed by: Francis Quam MD 07/09/2024 12:41 AM EDT RP Workstation: [id number]   CT Head Wo Contrast Result Date: 07/08/2024 IMPRESSION: 1. No evidence of an acute intracranial abnormality on this motion-degraded study. 2. 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 Electronically signed by: Dasie Hamburg MD 07/08/2024 06:22 PM EDT RP Workstation: [id number]    Cardiac Studies   2D echo 05/11/2024:  1. Left ventricular ejection fraction, by estimation, is 50 to 55%. The  left ventricle has low normal function. The left ventricle has no regional  wall motion abnormalities. Left ventricular diastolic parameters are  indeterminate.   2. Right ventricular systolic function is normal. The right ventricular  size is normal.   3. The mitral valve is normal in structure. Mild mitral valve  regurgitation.   4. The aortic valve is tricuspid. Aortic valve regurgitation is not  visualized.   5. The inferior vena cava is normal in size with greater than 50%  respiratory variability, suggesting right atrial pressure of 3 mmHg.   Patient Profile     78 y.o. female with history of coronary artery calcifications on CT imaging, recently diagnosed A-fib, chronic sinus tachycardia, hypertension, hyperlipidemia, chronic venous insufficiency/lymphedema, anxiety, depression, GERD, and hyperthyroidism (diagnosed August 2025) who was admitted with encephalopathy secondary to sepsis secondary to calcaneal pressure ulcers and we are seeing for A-fib with RVR.  Assessment & Plan    1. Persistent Afib with RVR: -Elevated  ventricular rates in the setting of acute infection with associated sepsis and relative hypotension  -Ventricular rates are improved in the 90s to low 100s bpm currently with paroxysms into the 130s bpm, will  tolerate overall rates less than 110 bpm -Largely asymptomatic  -Overall, rate control options are limited with amiodarone not ideal with underlying hyperthyroidism and hypotension precluding escalation of AV nodal blocking medication -Received digoxin  0.25 mg on 10/16 with a level of 0.8 on 10/18 -Continue digoxin  0.125 mg daily for added rate control now that potassium is repleted  -Continue metoprolol  25 mg twice daily, relative hypotension requiring midodrine precludes titration  -Now back on Eliquis  -Underlying frailty, DNR status, and likelihood for recurrent admissions/infections are barriers to cardioversion   2. Hypotension: -Requiring midodrine -Suspect elevated BP readings are in the setting of supine hypertension associated with midodrine  -Needs to sit up with midodrine   3. Hypokalemia: -Repleted  4. Hyperthyroidism: -TSH normal -Remains on methimazole       For questions or updates, please contact [facility] Please consult [email or web address] for contact info under Cardiology/STEMI.    Signed, Bernardino Bring, PA-C [facility] Pager: [phone] 07/13/2024, 8:07 AM

## 2024-07-13 NOTE — Progress Notes (Signed)
 Daily Progress Note   Patient Name: Alexandria Sherman       Date: 07/13/2024 DOB: 05-23-1946  Age: 78 y.o. MRN#: [id number] Attending Physician: Alexandria Burnard LABOR, DO Primary Care Physician: Alexandria Verneita CROME, MD Admit Date: 07/08/2024  Reason for Consultation/Follow-up: Establishing goals of care  HPI/Brief Hospital Review: Alexandria Sherman is a 79 y.o. year old female with past medical history of HTN, HLD, atrial fibrillation w/ RVR , hyperthyroidism, presenting to the ED due to concern for altered mental status and hallucinations. Appears from notes, PCP advised pt to seek care on 10/09 but pt refused along with ischemic changes noted on her feet.    Admitted and being treated for persistent A. Fib--cardiology following, frailty and hypotension precluding further titration and other interventions, lower extremity cellulitis versus wound infection from chronic venous stasis--remains on antibiotic therapy and AMS-reportedly hallucinations started PTA--neurology consulted   Palliative Medicine consulted for assisting with goals of care conversations.  Subjective: Extensive chart review has been completed prior to meeting patient including labs, vital signs, imaging, progress notes, orders, and available advanced directive documents from current and previous encounters.    Visited with Alexandria Sherman at her bedside.  Nursing staff at bedside attempting to utilize sit to stand lift and place Alexandria Sherman in recliner.  Alexandria Sherman with difficulty in following simple commands due to ongoing acute metabolic encephalopathy.  Requires 2+ maximum assistance with transfer.  During transfer Alexandria Sherman complaining of need to urinate but unable to continue with transfer to bedside commode as she is severely  deconditioned.  Noted diaphoresis and increased respirations, no acute distress denies chest pain or difficulty breathing.  Discussed with nursing at this time to avoid fall would recommend placing external catheter.  Discussed importance of continuing to try transferring to chair and allowing entry of natural daylight and avoiding further acute delirium.  After transfer, Alexandria Sherman settles down and continues to deny acute pain or discomfort.  She remains confused and unable to participate in meaningful goals of care conversations at this time.  No family or visitors at bedside during time of visit.  Secure message sent again to team including primary team as well as TOC regarding need for likely 24/7 in-home caregiver support or consideration of long-term care facility placement due to need for maximum assistance.  Again from chart review hospice has been engaged and plans to follow at discharge.  Disposition remains unclear.  Called and spoke with Alexandria Sherman sister Alexandria Sherman who is established HCPOA.  Alexandria Sherman shares she lives in [place] .  She is unable to visit Alexandria Sherman due to [name]'s daughter battling a medical condition.  Alexandria Sherman shares the last time she visited Alexandria Sherman home it was upsetting.  She describes Alexandria Sherman as a hoarder and difficult to reason with.  It seems as though Alexandria Sherman and Alexandria Sherman rely heavily on input from their PCP regarding establishing goals of care.  Alexandria Sherman confirms she has had discussions regarding hospice care.  Alexandria Sherman confirms Alexandria Sherman has in-home caregiver support only 8 hours/day.  When her caregivers are not in her home she is bedbound as she requires assistance with all ADLs.  Discussed the importance of safe discharge disposition and at this time Alexandria Sherman would likely need 24/7 support in the home or consideration of long-term care placement for which Alexandria Sherman agrees.  Update from conversation had with sister sent to primary attending as well as TOC.  TOC  to reach out for further clarification of disposition.  Answered and addressed all questions and concerns. PMT to continue to follow for ongoing needs and support.  Objective:  Physical Exam Constitutional:      General: She is not in acute distress.    Appearance: She is ill-appearing.  HENT:     Head: Normocephalic.  Pulmonary:     Effort: Pulmonary effort is normal. No respiratory distress.  Skin:    General: Skin is warm and dry.  Neurological:     Mental Status: She is alert. She is disoriented.     Motor: Weakness present.  Psychiatric:        Behavior: Behavior normal.             Vital Signs: BP (!) 94/56 (BP Location: Right Arm)   Pulse 77   Temp 98.3 F (36.8 C) (Oral)   Resp (!) 34   Ht 5' 3 (1.6 m)   Wt 59 kg   SpO2 95%   BMI 23.03 kg/m  SpO2: SpO2: 95 % O2 Device: O2 Device: Nasal Cannula O2 Flow Rate: O2 Flow Rate (L/min): 5 L/min   Palliative Care Assessment & Plan   Assessment/Recommendation/Plan  Disposition unclear--will need more support in home if plan to return home Hospice liaison initial engagement 10/16--liaison continues to follow and will enact benefits on discharge PMT to continue to follow for ongoing needs and support  Thank you for allowing the Palliative Medicine Team to assist in the care of this patient.  Visit includes: Detailed review of medical records (labs, imaging, vital signs), medically appropriate exam (mental status, respiratory, cardiac, skin), discussed with treatment team, counseling and educating patient, family and staff, documenting clinical information, medication management and coordination of care.  Alexandria Lesches, DNP, AGNP-C Palliative Medicine   Please contact Palliative Medicine Team phone at [phone] for questions and concerns.

## 2024-07-14 ENCOUNTER — Telehealth

## 2024-07-14 DIAGNOSIS — I4891 Unspecified atrial fibrillation: Secondary | ICD-10-CM | POA: Diagnosis not present

## 2024-07-14 DIAGNOSIS — D329 Benign neoplasm of meninges, unspecified: Secondary | ICD-10-CM | POA: Diagnosis not present

## 2024-07-14 DIAGNOSIS — Z515 Encounter for palliative care: Secondary | ICD-10-CM | POA: Diagnosis not present

## 2024-07-14 DIAGNOSIS — I70223 Atherosclerosis of native arteries of extremities with rest pain, bilateral legs: Secondary | ICD-10-CM | POA: Diagnosis not present

## 2024-07-14 LAB — CBC
HCT: 36.5 % (ref 36.0–46.0)
Hemoglobin: 11.1 g/dL — ABNORMAL LOW (ref 12.0–15.0)
MCH: 32.5 pg (ref 26.0–34.0)
MCHC: 30.4 g/dL (ref 30.0–36.0)
MCV: 106.7 fL — ABNORMAL HIGH (ref 80.0–100.0)
Platelets: 250 K/uL (ref 150–400)
RBC: 3.42 MIL/uL — ABNORMAL LOW (ref 3.87–5.11)
RDW: 16.2 % — ABNORMAL HIGH (ref 11.5–15.5)
WBC: 9.2 K/uL (ref 4.0–10.5)
nRBC: 0 % (ref 0.0–0.2)

## 2024-07-14 LAB — BASIC METABOLIC PANEL WITH GFR
Anion gap: 18 — ABNORMAL HIGH (ref 5–15)
BUN: 22 mg/dL (ref 8–23)
CO2: 28 mmol/L (ref 22–32)
Calcium: 9.5 mg/dL (ref 8.9–10.3)
Chloride: 98 mmol/L (ref 98–111)
Creatinine, Ser: 0.49 mg/dL (ref 0.44–1.00)
GFR, Estimated: >60 mL/min (ref >60)
Glucose, Bld: 78 mg/dL (ref 70–99)
Potassium: 4.2 mmol/L (ref 3.5–5.1)
Sodium: 144 mmol/L (ref 135–145)

## 2024-07-14 LAB — GLUCOSE, CAPILLARY: Glucose-Capillary: 113 mg/dL — ABNORMAL HIGH (ref 70–99)

## 2024-07-14 MED ORDER — HALOPERIDOL LACTATE 5 MG/ML IJ SOLN: 2.0000 mg | Freq: Four times a day (QID) | INTRAMUSCULAR | Status: DC | PRN

## 2024-07-14 MED ORDER — LORAZEPAM 2 MG/ML IJ SOLN: 1.0000 mg | INTRAMUSCULAR | Status: DC | PRN

## 2024-07-14 MED ORDER — GLYCOPYRROLATE 1 MG PO TABS: 1.0000 mg | ORAL_TABLET | ORAL | Status: DC | PRN

## 2024-07-14 MED ORDER — BIOTENE DRY MOUTH MT LIQD
15.0000 mL | Freq: Two times a day (BID) | OROMUCOSAL | Status: DC
  Administered 2024-07-14 (×2): 15 mL via TOPICAL

## 2024-07-14 MED ORDER — DIPHENHYDRAMINE HCL 50 MG/ML IJ SOLN: 25.0000 mg | INTRAMUSCULAR | Status: DC | PRN

## 2024-07-14 MED ORDER — GLYCOPYRROLATE 0.2 MG/ML IJ SOLN: 0.2000 mg | INTRAMUSCULAR | Status: DC | PRN

## 2024-07-14 MED ORDER — HALOPERIDOL 2 MG PO TABS: 2.0000 mg | ORAL_TABLET | Freq: Four times a day (QID) | ORAL | Status: DC | PRN

## 2024-07-14 MED ORDER — ONDANSETRON HCL 4 MG/2ML IJ SOLN
4.0000 mg | Freq: Four times a day (QID) | INTRAMUSCULAR | Status: DC | PRN
  Administered 2024-07-14: 4 mg via INTRAVENOUS
  Filled 2024-07-14: qty 2

## 2024-07-14 MED ORDER — GLYCOPYRROLATE 0.2 MG/ML IJ SOLN
0.2000 mg | INTRAMUSCULAR | Status: DC | PRN
  Administered 2024-07-14: 0.2 mg via INTRAVENOUS
  Filled 2024-07-14 (×2): qty 1

## 2024-07-14 MED ORDER — HALOPERIDOL LACTATE 2 MG/ML PO CONC: 2.0000 mg | Freq: Four times a day (QID) | ORAL | Status: DC | PRN

## 2024-07-14 MED ORDER — POLYVINYL ALCOHOL 1.4 % OP SOLN: 1.0000 [drp] | Freq: Four times a day (QID) | OPHTHALMIC | Status: DC | PRN

## 2024-07-14 MED ORDER — MORPHINE SULFATE (PF) 2 MG/ML IV SOLN
1.0000 mg | INTRAVENOUS | Status: DC | PRN
  Administered 2024-07-14: 2 mg via INTRAVENOUS
  Filled 2024-07-14: qty 1

## 2024-07-14 MED ORDER — MORPHINE SULFATE (PF) 2 MG/ML IV SOLN
1.0000 mg | INTRAVENOUS | Status: DC | PRN
  Administered 2024-07-14 (×3): 1 mg via INTRAVENOUS
  Filled 2024-07-14 (×2): qty 1

## 2024-07-14 NOTE — Progress Notes (Signed)
 Alexandria Sherman ( second emergency contact) took patient's eye glasses home this evening.

## 2024-07-14 NOTE — Progress Notes (Signed)
 Progress Note   Patient: Alexandria Sherman FMW:[id number] DOB: 10-Jun-1946 DOA: 07/08/2024     5 DOS: the patient was seen and examined on 07/14/2024   Brief hospital course:  Alexandria Sherman is a 78 y.o. female with a PMH significant for HTN, HLD, paroxsymal atrial fibrillation, hyperthyroidism, venous stasis, and mild cognitive impairment presenting to the ED from the clinic due to concern for altered mental status and hallucinations. Prior to hospitalization, pt was still dependant for ADLs and iADLs but it was exacerbated by her hospitalization. She has been declining per the sister and was recommended hospice by PCP but pt switched PCP.    ED Course: VSS tachycardia with heart rate up to 130s and A-fib RVR, blood pressure stable, requiring 2 L oxygen which is baseline to maintain sats greater than 95%....    Patient was admitted to medicine service for further workup and management as outlined in detail below.   07/11/24 -more hemodynamically stable. Still very disoriented. Appears to be asymptomatic from afib standpoint.   07/12/24 - HR in 110's to 120's.  BP's stable. Requiring 2-4 L/min oxygen.  07/13/24 -- HR's in 100's to 120's. O2 up to 5 L/min.  BNP elevated.  CXR repeated - very low lung volumes and bibasilar collapse vs consolidation L>R.  IV Lasix  20 mg x 1  07/14/2024 --transitioned to comfort care   Assessment and Plan:  Comfort care --as of this morning, 10/20. Patient had a rapid clinical decline earlier today.  Her sister was updated by palliative care and made decision to transition to comfort measures as plan was to discharge with hospice once more stable. -- Comfort medications per orders -- Notify provider if any signs of pain, distress, discomfort of any sort -- RNs may pronounce   ==== A&P prior to comfort care:  Persistent Afib with RVR- limited treatment options given co-morbidities. Better rate control today but still up to 120s on exam today.  10/18:  HR remains uncontrolled 110s to 120s --Cardiology following --Continue metoprolol  --Started on digoxin  --IV metoprolol  PRN   Hypotension- this is likely multifactorial with medications side effects, A-fib RVR, and possible sepsis/cellulitis.  BP's now stable --Maintain MAP>65 --Off IV fluids --continue midodrine --diltiazem  was discontinued - avoid pressors if possible given questionable blood flow to LEs.    Lower extremity ischemia ruled out- LE arteries patent on imaging. No intervention required, per vascular surgery consult.    Severe sepsis due to Lower extremity cellulitis vs infected wound from chronic venous stasis Unstageable bilateral heel pressure injuries Severe sepsis POA evidenced by tachycardia, tachypnea, lactic acidosis consistent with organ dysfunction.  BP's responded to IV fluids and midodrine.  Septic shock ruled out.   - continue IV Abx: change aztreonam  >> ceftriaxone, continue flagyl, vancomycin  & de-escalate when clinically improved - f/u cultures- NGTD - WOC consult - appreciate wound care recs - pad and/or float heels - no podiatry intervention required   Hyperthyroidism- TSH normal  - continue home methimazole  and beta-blocker   Acute on Chronic hypoxic respiratory failure-  2L/min baseline O2 requirement but had observed choking as suspected aspiration when taking morning pills Now needing 5 L/min oxygen Intermittent dyspnea in setting of a-fib RVR - SLP consulted and made dysphagia 2. No observed aspiration on eval.  PLAN --Check chest xray given rising O2 need --Check BNP - elevated > 700 --Single dose 20 mg IV Lasix  & monitor response   Acute delirium with hallucinations  Anxiety- possibly medication side effect, hospital environment.  Brain MRI with findings below - hydroxyzine PRN - continue depakote , venlafaxine , seroquel    Meningiomas: Per Neurology, would not explain her hallucinations. Brain MRI: 1.6cm and 1.7cm lesions- negative for  evidence of edema or mass effect.  --Neurology consulted, appreciate recs: --Delirium precautions --Avoid sedating meds --OOB to chair daily --Continue Depakote  --Consider reducing Effexor  dose (can cause hallucinations in elderly) --Will defer MRI C-spine as pt would not be candidate for neurosurgical intervention & plan to d/c with hospice  Goals of care --palliative care consulted. Plan is to discharge with hospice when medically stable.  Venue is TBD - home w caregiver support vs facility.      Subjective: Patient had a rapid clinical decline early this morning.  She appears to be actively dying.  Patient's sister was updated by palliative care NP, and decided to transition patient to comfort care today.  Patient appears comfortable but is nonresponsive.   Physical Exam: Vitals:   07/14/24 0900 07/14/24 1000 07/14/24 1100 07/14/24 1200  BP:  106/84  (!) 128/103  Pulse: 78 (!) 148 (!) 114 (!) 131  Resp: (!) 31 (!) 8 11 15   Temp:      TempSrc:      SpO2: 98% 95% 95% (!) 84%  Weight:      Height:       General exam: Somnolent and unresponsive, no acute distress, chronically ill-appearing HEENT: Open mouth, dry mucous membranes Respiratory system: CTAB diminished with very poor inspiratory volumes, no wheezes heard Cardiovascular system: normal S1/S2, irregularly irregular, no edema Gastrointestinal system: soft, NT, ND Central nervous system: Patient unresponsive Extremities: Severe bilateral lower extremity venous stasis hyperpigmentation and skin flaking, padded dressings of bilateral heels    Data Reviewed:  Notable labs: Normal BMP except for anion gap 18 Hemoglobin stable 11.1  10/16 blood cultures negative   Family Communication: None present at bedside.  Palliative care NP updated sister this morning and may transition to comfort care.  Disposition: Status is: Inpatient Remains inpatient appropriate because: On comfort care measures with anticipation of  in-hospital death    Planned Discharge Destination: Hospice    Time spent: 42 minutes  Author: Burnard DELENA Cunning, DO 07/14/2024 2:10 PM  For on call review [email or web address].

## 2024-07-14 NOTE — Plan of Care (Signed)
  Problem: Education: Goal: Knowledge of General Education information will improve Description: Including pain rating scale, medication(s)/side effects and non-pharmacologic comfort measures Outcome: Progressing   Problem: Nutrition: Goal: Adequate nutrition will be maintained Outcome: Progressing   Problem: Elimination: Goal: Will not experience complications related to bowel motility Outcome: Progressing Goal: Will not experience complications related to urinary retention Outcome: Progressing   Problem: Pain Managment: Goal: General experience of comfort will improve and/or be controlled Outcome: Progressing   Problem: Safety: Goal: Ability to remain free from injury will improve Outcome: Progressing   Problem: Skin Integrity: Goal: Risk for impaired skin integrity will decrease Outcome: Progressing

## 2024-07-14 NOTE — Progress Notes (Signed)
 [facility] Liaison Note   Follow up on outpatient palliative care patient with [facility]- which is now a new hospice referral.   Patient has had a significant decline overnight.  Spoke with Devere Sacks, NP/PMT who states patient is too unstable and expect a hospital death.  Hospital liaison team will continue to follow.   Please do not hesitate to call with any hospice related questions or concerns. No discharge Plans today.   Saddie HILARIO Na, RN Nurse Liaison [phone]

## 2024-07-14 NOTE — Progress Notes (Addendum)
 Palliative Care Progress Note, Assessment & Plan   Patient Name: Alexandria Sherman       Date: 07/14/2024 DOB: 1946-06-22  Age: 78 y.o. MRN#: [id number] Attending Physician: Alexandria Burnard LABOR, DO Primary Care Physician: Alexandria Verneita CROME, MD Admit Date: 07/08/2024  Subjective: Unable to assess  HPI: Per previous HPI: Alexandria Sherman is a 78 y.o. year old female with past medical history of HTN, HLD, atrial fibrillation w/ RVR , hyperthyroidism, presenting to the ED due to concern for altered mental status and hallucinations. Appears from notes, PCP advised pt to seek care on 10/09 but pt refused along with ischemic changes noted on her feet.    Admitted and being treated for persistent A. Fib--cardiology following, frailty and hypotension precluding further titration and other interventions, lower extremity cellulitis versus wound infection from chronic venous stasis--remains on antibiotic therapy and AMS-reportedly hallucinations started PTA--neurology consulted   Palliative Medicine consulted for assisting with goals of care conversations.    Summary of counseling/coordination of care: Extensive chart review completed prior to meeting patient including labs, vital signs, imaging, progress notes, orders, and available advanced directive documents from current and previous encounters.   After reviewing the patient's chart I assessed patient at bedside after receiving message from RN that patient was having new onset increased WOB this morning.   Ill-appearing, elderly female lying in bed with nursing staff at bedside. Patient appears to be actively dying with fixed gaze, tachypnea and increased HR. She does not acknowledge presence, track or respond to verbal or tactile stimuli. She appears comfortable with  no signs air hunger, anxiety or distress.   Contacted patient's sister, Alexandria Sherman, to notify of patient's significant change in status. Alexandria Sherman shares that she is surprised that her sister worsened so quickly, but at the same time not shocked since she has had such a poor quality of life for so long.   Recommended with her sister's sudden change in status and showing signs of respiratory decline, transition to comfort care would be appropriate.   I explained comfort care as care where the patient would no longer receive aggressive medical interventions such as continuous vital signs, lab work, radiology testing, or medications not focused on comfort, peace, and dignity. This includes stopping antibiotics and weaning oxygen to room air, as these are generally not accepted as providing comfort but only prolonging the dying process artificially. All care would focus on how the patient is looking and feeling. This would include management of any symptoms that may cause discomfort, pain, shortness of breath/air hunger, increased work of breathing, cough, nausea, agitation/restlessness, anxiety, and/or secretions etc. Alexandria Sherman verbalized understanding and agreement with this plan. She shares that she wants her sister to be comfortable and pass in peace.   Therapeutic silence and active listening provided for Alexandria Sherman to share her thoughts and emotions regarding current medical situation.  Emotional support provided.  Alexandria Sherman shares that she resides in [place]  and is not able to visit.  She is appreciative and thankful for the care her sister is receiving.   Physical Exam Vitals reviewed.  Constitutional:      General: She is not in acute distress.    Appearance: She is ill-appearing.  HENT:     Head: Normocephalic and atraumatic.     Mouth/Throat:     Mouth: Mucous membranes are dry.  Eyes:     Comments: Fixed gaze  Cardiovascular:     Rate and Rhythm: Tachycardia present.  Pulmonary:     Effort: No  respiratory distress.     Comments: Tachypnea Skin:    Comments: Bilateral feet cool to touch   Recommendations/Plan: DNR- Comfort measures only Utilize ordered medications for air hunger, pain, anxiety and excessive secretions Anticipate hospital death      Total Time 65 minutes   Time spent includes: Detailed review of medical records (labs, imaging, vital signs), medically appropriate exam (mental status, respiratory, cardiac, skin), discussed with treatment team, counseling and educating patient, family and staff, documenting clinical information, medication management and coordination of care.     Alexandria Sherman, AMANDA [facility] Palliative Medicine Team  07/14/2024 9:17 AM  Office [phone]  Pager [phone]

## 2024-07-14 NOTE — Progress Notes (Signed)
 Rounding Note   Patient Name: Alexandria Sherman Date of Encounter: 07/14/2024  [facility] Cardiologist: Deatrice Cage, MD   Subjective Patient reports ongoing shortness of breath and remains on 4 L nasal cannula.  She denies chest pain.  She appears uncomfortable although has difficulty expressing her concern.  Remains in atrial fibrillation with rates in the 90s to 110s with paroxysms up to 150s bpm.  Scheduled Meds:  apixaban   5 mg Oral BID   digoxin   0.125 mg Oral Daily   divalproex   125 mg Oral BID   feeding supplement  237 mL Oral BID BM   latanoprost   1 drop Both Eyes QHS   methimazole   2.5 mg Oral Daily   metoprolol  tartrate  25 mg Oral BID   midodrine  10 mg Oral TID WC   pantoprazole   40 mg Oral Daily   QUEtiapine   12.5 mg Oral QPC supper   timolol   1 drop Both Eyes Daily   venlafaxine   75 mg Oral BID WC   Continuous Infusions:  cefTRIAXone (ROCEPHIN)  IV 200 mL/hr at 07/14/24 0700   metronidazole Stopped (07/14/24 0641)   vancomycin  Stopped (07/14/24 0639)   PRN Meds: acetaminophen  **OR** acetaminophen , haloperidol lactate, hydrOXYzine, metoprolol  tartrate, morphine  injection, ondansetron  (ZOFRAN ) IV   Vital Signs  Vitals:   07/14/24 0600 07/14/24 0615 07/14/24 0800 07/14/24 0900  BP: 99/63  (!) 102/58   Pulse: 68  (!) 27   Resp: (!) 22 (!) 26 (!) 22 (!) 31  Temp:   (!) 97.2 F (36.2 C)   TempSrc:   Axillary   SpO2: 95% 94% 100%   Weight:      Height:        Intake/Output Summary (Last 24 hours) at 07/14/2024 0916 Last data filed at 07/14/2024 0700 Gross per 24 hour  Intake 822.28 ml  Output 200 ml  Net 622.28 ml      07/14/2024    5:00 AM 07/08/2024    5:25 PM 07/08/2024    4:23 PM  Last 3 Weights  Weight (lbs) 140 lb 14 oz 130 lb 135 lb  Weight (kg) 63.9 kg 58.968 kg 61.236 kg      Telemetry Atrial fibrillation with rate 90s to 110s bpm with paroxysms up to 150s bpm with aberrancy- Personally Reviewed  Physical Exam  GEN:  Elderly, frail, and chronically ill-appearing Neck: No JVD Cardiac: IRIR, no murmurs, rubs, or gallops.  Respiratory: Clear to auscultation bilaterally. GI: Soft, nontender, non-distended  MS: Trace bilateral pretibial edema with hyperpigmentation Neuro: Alert, intermittently confused.  Nonfocal  Psych: Normal affect   Labs High Sensitivity Troponin:   Recent Labs  Lab 07/08/24 2100  TROPONINIHS 14     Chemistry Recent Labs  Lab 07/08/24 2100 07/10/24 0119 07/10/24 0444 07/11/24 1220 07/12/24 0518 07/13/24 0434 07/14/24 0449  NA  --    < > 143   < > 140 141 144  K  --    < > 3.1*   < > 4.9 4.1 4.2  CL  --    < > 92*   < > 97* 98 98  CO2  --    < > 36*   < > 28 32 28  GLUCOSE  --    < > 105*   < > 83 93 78  BUN  --    < > 16   < > 18 24* 22  CREATININE  --    < > <0.30*   < >  0.39* 0.53 0.49  CALCIUM   --    < > 8.3*   < > 9.1 9.3 9.5  MG  --   --   --   --  1.9  --   --   PROT 7.0  --  5.9*  --   --   --   --   ALBUMIN 3.0*  --  2.6*  --   --   --   --   AST 19  --  16  --   --   --   --   ALT 11  --  10  --   --   --   --   ALKPHOS 67  --  56  --   --   --   --   BILITOT 0.8  --  0.8  --   --   --   --   GFRNONAA  --    < > NOT CALCULATED   < > >60 >60 >60  ANIONGAP  --    < > 15   < > 15 11 18*   < > = values in this interval not displayed.    Lipids  Recent Labs  Lab 07/08/24 2101  CHOL 174  TRIG 154*  HDL 40*  LDLCALC 103*  CHOLHDL 4.4    Hematology Recent Labs  Lab 07/08/24 1733 07/10/24 0444 07/14/24 0449  WBC 10.7* 8.4 9.2  RBC 3.59* 3.18* 3.42*  HGB 11.5* 10.2* 11.1*  HCT 37.1 33.7* 36.5  MCV 103.3* 106.0* 106.7*  MCH 32.0 32.1 32.5  MCHC 31.0 30.3 30.4  RDW 15.1 15.2 16.2*  PLT 232 185 250   Thyroid   Recent Labs  Lab 07/08/24 2100  TSH 3.461    BNP Recent Labs  Lab 07/08/24 1733 07/13/24 1008  BNP 479.3* 701.7*    DDimer No results for input(s): DDIMER in the last 168 hours.   Radiology  DG Chest Port 1 View Result  Date: 07/13/2024 IMPRESSION: Markedly low volume film with bibasilar collapse/consolidation, left greater than right. Electronically Signed   By: Camellia Candle M.D.   On: 07/13/2024 10:53   Cardiac Studies  2D echo 05/11/2024:  1. Left ventricular ejection fraction, by estimation, is 50 to 55%. The  left ventricle has low normal function. The left ventricle has no regional  wall motion abnormalities. Left ventricular diastolic parameters are  indeterminate.   2. Right ventricular systolic function is normal. The right ventricular  size is normal.   3. The mitral valve is normal in structure. Mild mitral valve  regurgitation.   4. The aortic valve is tricuspid. Aortic valve regurgitation is not  visualized.   5. The inferior vena cava is normal in size with greater than 50%  respiratory variability, suggesting right atrial pressure of 3 mmHg.  Patient Profile   78 y.o. female with history of coronary artery calcifications on CT imaging, recently diagnosed atrial fibrillation, chronic sinus tachycardia, hypertension, hyperlipidemia, chronic venous insufficiency/lymphedema, anxiety, depression, GERD, and hyperthyroidism (diagnosed 04/2024) who was admitted with encephalopathy secondary to sepsis secondary to calcaneal pressure ulcers who we are seeing for atrial fibrillation with RVR.  Assessment & Plan   Persistent atrial fibrillation with RVR - Elevated ventricular rates in the setting of acute infection with associated sepsis and relative hypotension - Largely asymptomatic to her atrial fibrillation - Rate control options are limited with amiodarone not ideal given underlying hyperthyroidism and hypotension precluding escalation of AV nodal blocking medications - Ventricular  rates are largely controlled in the 90s to 110s bpm with paroxysms up to 150s bpm - Received digoxin  0.25 mg on 10/16 with level of 0.8 on 7/18 - Continue digoxin  0.125 mg daily and metoprolol  25 mg twice daily,  relative hypotension requiring midodrine precludes titration - Continue Eliquis  5 mg twice daily - Patient not a good candidate for cardioversion given underlying frailty, DNR status, and likelihood for recurrent admissions/infections  Hypotension - Requiring midodrine - Suspect elevated BP readings are in the setting of supine hypertension associated with midodrine - Recommend patient remain upright with midodrine  Hypokalemia - Repleted, K 4.2 today  Hyperthyroidism - TSH normal - Remains on methimazole   For questions or updates, please contact [facility] Please consult [email or web address] for contact info under       Signed, Lesley LITTIE Maffucci, PA-C  07/14/2024, 9:16 AM

## 2024-07-15 LAB — CULTURE, BLOOD (ROUTINE X 2)
Culture: NO GROWTH
Culture: NO GROWTH
Special Requests: ADEQUATE

## 2024-07-18 ENCOUNTER — Inpatient Hospital Stay: Admitting: Internal Medicine

## 2024-07-24 ENCOUNTER — Ambulatory Visit: Admitting: Nurse Practitioner

## 2024-07-26 NOTE — Death Summary Note (Signed)
 DEATH SUMMARY   Patient Details  Name: Alexandria Sherman MRN: [id number] DOB: 08-13-1946 ERE:Uloon, Verneita CROME, MD Admission/Discharge Information   Admit Date:  2024/07/11  Date of Death: Date of Death: 07-18-2024  Time of Death: Time of Death: 08-07-24  Length of Stay: 6   Principle Cause of death: Circulatory collapse   Due to atrial fibrillation with RVR in the setting of severe sepsis with cellulitis and advanced vascular disease   Hospital Diagnoses: Principal Problem:   Critical limb ischemia of both lower extremities (HCC) Active Problems:   Chronic diastolic CHF (congestive heart failure) (HCC)   Essential hypertension   Hyperthyroidism   PAD (peripheral artery disease)   Atrial fibrillation (HCC)   Confusion   Hyperlipidemia   Meningioma (HCC)   Ischemic ulcer of both feet [facility])   Hospital Course:  Alexandria Sherman is a 78 y.o. female with a PMH significant for HTN, HLD, paroxsymal atrial fibrillation, hyperthyroidism, venous stasis, and mild cognitive impairment presenting to the ED from the clinic due to concern for altered mental status and hallucinations. Prior to hospitalization, pt was still dependant for ADLs and iADLs but it was exacerbated by her hospitalization. She has been declining per the sister and was recommended hospice by PCP but pt switched PCP.    ED Course: VSS tachycardia with heart rate up to 130s and A-fib RVR, blood pressure stable, requiring 2 L oxygen which is baseline to maintain sats greater than 95%....     Patient was admitted to medicine service for further workup and management as outlined in detail below.   07/11/24 -more hemodynamically stable. Still very disoriented. Appears to be asymptomatic from afib standpoint.    07/12/24 - HR in 110's to 120's.  BP's stable. Requiring 2-4 L/min oxygen.   07/13/24 -- HR's in 100's to 120's. O2 up to 5 L/min.  BNP elevated.  CXR repeated - very low lung volumes and bibasilar collapse vs  consolidation L>R.  IV Lasix  20 mg x 1   07/14/2024 --transitioned to comfort care  Patient subsequently passed away at 10 minutes after midnight 07-18-2024.   Assessment and Plan:  Comfort care --as of this morning, 10/20. Patient had a rapid clinical decline earlier today.  Her sister was updated by palliative care and made decision to transition to comfort measures as plan was to discharge with hospice once more stable. -- Comfort medications per orders -- Notify provider if any signs of pain, distress, discomfort of any sort -- RNs may pronounce     ==== A&P prior to comfort care:   Persistent Afib with RVR- limited treatment options given co-morbidities. Better rate control today but still up to 120s on exam today.  10/18: HR remains uncontrolled 110s to 120s --Cardiology following --Continue metoprolol  --Started on digoxin  --IV metoprolol  PRN   Hypotension- this is likely multifactorial with medications side effects, A-fib RVR, and possible sepsis/cellulitis.  BP's now stable --Maintain MAP>65 --Off IV fluids --continue midodrine --diltiazem  was discontinued - avoid pressors if possible given questionable blood flow to LEs.    Lower extremity ischemia ruled out- LE arteries patent on imaging. No intervention required, per vascular surgery consult.    Severe sepsis due to Lower extremity cellulitis vs infected wound from chronic venous stasis Unstageable bilateral heel pressure injuries Severe sepsis POA evidenced by tachycardia, tachypnea, lactic acidosis consistent with organ dysfunction.  BP's responded to IV fluids and midodrine.  Septic shock ruled out.   - continue IV Abx: change aztreonam  >>  ceftriaxone, continue flagyl, vancomycin  & de-escalate when clinically improved - f/u cultures- NGTD - WOC consult - appreciate wound care recs - pad and/or float heels - no podiatry intervention required   Hyperthyroidism- TSH normal  - continue home methimazole  and  beta-blocker   Acute on Chronic hypoxic respiratory failure-  2L/min baseline O2 requirement but had observed choking as suspected aspiration when taking morning pills Now needing 5 L/min oxygen Intermittent dyspnea in setting of a-fib RVR - SLP consulted and made dysphagia 2. No observed aspiration on eval.  PLAN --Check chest xray given rising O2 need --Check BNP - elevated > 700 --Single dose 20 mg IV Lasix  & monitor response   Acute delirium with hallucinations  Anxiety- possibly medication side effect, hospital environment.  Brain MRI with findings below - hydroxyzine PRN - continue depakote , venlafaxine , seroquel    Meningiomas: Per Neurology, would not explain her hallucinations. Brain MRI: 1.6cm and 1.7cm lesions- negative for evidence of edema or mass effect.  --Neurology consulted, appreciate recs: --Delirium precautions --Avoid sedating meds --OOB to chair daily --Continue Depakote  --Consider reducing Effexor  dose (can cause hallucinations in elderly) --Will defer MRI C-spine as pt would not be candidate for neurosurgical intervention & plan to d/c with hospice   Goals of care --palliative care consulted. Plan was to discharge with hospice when medically stable, however patient had a rapid clinical decline on 10/20, having not responded to all medical interventions.   See Palliative Care NP's notes as well.         Procedures: none  Consultations: cardiology, palliative care, neurology  The results of significant diagnostics from this hospitalization (including imaging, microbiology, ancillary and laboratory) are listed below for reference.   Significant Diagnostic Studies: DG Chest Port 1 View Result Date: 07/13/2024 CLINICAL DATA:  Acute respiratory failure with hypoxia. EXAM: PORTABLE CHEST 1 VIEW COMPARISON:  07/10/2024 FINDINGS: Markedly low volume film. The cardio pericardial silhouette is enlarged. Bibasilar collapse/consolidation again noted, left greater  than right. No overt airspace pulmonary edema. Bones are diffusely demineralized. Telemetry leads overlie the chest. IMPRESSION: Markedly low volume film with bibasilar collapse/consolidation, left greater than right. Electronically Signed   By: Camellia Candle M.D.   On: 07/13/2024 10:53   DG Chest 1 View Result Date: 07/10/2024 EXAM: 1 VIEW(S) XRAY OF THE CHEST 07/10/2024 03:52:00 AM COMPARISON: 07/09/2024 CLINICAL HISTORY: Dyspnea SOB FINDINGS: LUNGS AND PLEURA: Low lung volumes with vascular crowding. Mildly patchy bibasilar opacities, likely atelectasis. Possible small left pleural effusion. No pneumothorax. HEART AND MEDIASTINUM: No acute abnormality of the cardiac and mediastinal silhouettes. BONES AND SOFT TISSUES: Old left lateral clavicle fracture deformity. No acute osseous abnormality. IMPRESSION: 1. Low lung volumes with mild bibasilar atelectasis. 2. Possible small left pleural effusion. Electronically signed by: Pinkie Pebbles MD 07/10/2024 03:56 AM EDT RP Workstation: [id number]   MR BRAIN WO CONTRAST Result Date: 07/09/2024 EXAM: MRI BRAIN WITHOUT CONTRAST 07/08/2024 11:20:39 PM TECHNIQUE: Multiplanar multisequence MRI of the head/brain was performed without the administration of intravenous contrast. Examination severely degraded by motion artifact. COMPARISON: Comparison made with prior CT from earlier the same day. CLINICAL HISTORY: Meningioma. Best obtainable images pt coached on motion. Pt did not cooperate ; Per MD: 78 y.o. female with mild cognitive impairment, chronic lymphedema who comes in with concerns for altered mental status. she has been more agitated and confused with paranoia.for 2 weeks. FINDINGS: BRAIN AND VENTRICLES: Examination severely degraded by motion artifact. Cerebral volume at the normal limits. Patchy T2 FLAIR hyperintensity involving the periventricular and  deep white matter, consistent with chronic subvascular ischemic disease, moderate in nature. No evidence  for acute or subacute infarct. No areas of chronic cortical infarction. No acute intracranial hemorrhage. No visible chronic intracranial blood products. 1.6 cm extra axial mass overlying the right temporal convexity, consistent with a meningioma (series 10, image 13). Additional 1.7 cm en plaque left parafalcine meningioma at the skull vertex (series 10, 24). No associated edema or mass effect about these lesions. No midline shift. No hydrocephalus. The sella is unremarkable. Normal flow voids. ORBITS: No acute abnormality. SINUSES AND MASTOIDS: No acute abnormality. BONES AND SOFT TISSUES: Normal marrow signal. No acute soft tissue abnormality. IMPRESSION: 1. No acute intracranial abnormality. 2. 1.6 cm meningioma overlying the right temporal convexity, with additional 1.7 cm left parafalcine meningioma at the skull vertex. No associated edema or mass effect. Who Electronically signed by: Morene Hoard MD 07/09/2024 12:49 AM EDT RP Workstation: [id number]   DG Chest Port 1 View Result Date: 07/09/2024 EXAM: 1 VIEW(S) XRAY OF THE CHEST 07/09/2024 12:20:00 AM COMPARISON: Portable chest x-ray 05/13/2024. CLINICAL HISTORY: Shortness of breath. Pt comes with confusion that started 2-3 weeks ago. Pt placed on new med and family not sure if that caused it. Pt also has ulcers on bottom of feet that might be infected. Pt is not diabetic. Pt states her feet has bad and she has had people step on her. FINDINGS: LUNGS AND PLEURA: The lungs again are noted to be expiratory. Small pleural effusions are again noted and may be chronic. A poor inspiration again is noted with consolidation or atelectasis in the hypoinflated bases. The mid and upper lung fields are generally clear. No pulmonary edema. No pneumothorax. A follow-up study is recommended in full inspiration, preferably PA and lateral views if possible. HEART AND MEDIASTINUM: The cardiac size is normal. The mediastinum is stable with aortic atherosclerosis and  mild tortuosity. BONES AND SOFT TISSUES: Osteopenia. There is mild dextroscoliosis and multilevel degenerative change of the spine. 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 There is bilateral shoulder djd. IMPRESSION: 1. Expiratory film. 2. Consolidation or atelectasis in the hypoinflated bases. 3. Small pleural effusions, possibly chronic. 4. Recommend repeat chest radiographs in full inspiration, preferably PA and lateral views if possible. Electronically signed by: Francis Quam MD 07/09/2024 12:41 AM EDT RP Workstation: [id number]   CT Head Wo Contrast Result Date: 07/08/2024 EXAM: CT HEAD WITHOUT CONTRAST 07/08/2024 06:03:38 PM TECHNIQUE: CT of the head was performed without the administration of intravenous contrast. Automated exposure control, iterative reconstruction, and/or weight based adjustment of the mA/kV was utilized to reduce the radiation dose to as low as reasonably achievable. COMPARISON: Head CT 01/14/2022 and MRI 04/17/2013. CLINICAL HISTORY: Altered mental status. FINDINGS: BRAIN AND VENTRICLES: Within limitations of moderate motion artifact, no acute large territory infarct, intrarenal hemorrhage, midline shift, extra axial fluid collection, or hydrocephalus is identified. There is mild cerebral atrophy. Cerebral white matter hypodensities are grossly similar to the prior CT and are nonspecific but compatible with moderate chronic small vessel ischemic disease. An approximately 1.2 cm calcified extra axial mass over the right temporoparietal convexity is similar to the prior CT without evidence of associated brain edema. There is associated mild skull hyperostosis. ORBITS: No acute abnormality. SINUSES: No acute abnormality. SOFT TISSUES AND SKULL: No acute soft tissue abnormality. No skull fracture. IMPRESSION: 1. No evidence of an acute intracranial abnormality on this motion-degraded study. 2. 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 Electronically signed  by: Dasie Hamburg  MD 07/08/2024 06:22 PM EDT RP Workstation: [id number]    Microbiology: No results found for this or any previous visit (from the past 240 hours).  Time spent: 15 minutes  Signed: Burnard DELENA Cunning, DO 07/18/24

## 2024-07-26 NOTE — Progress Notes (Signed)
 Patient expired peacefully @ 0010 , death verified by two Registered Nurses. Dr. Lawence notified, per Dr. Lawence , Dr. Fausto, Alexandria Sherman will sign the death certificate in the morning . Alexandria Sherman (sister and POA ) notified of death. She provided funeral home name as [facility] in [place], [place]. Organ donor notified. Post mortem care provided, body placed in body bag and labeled.   Body transported to the morgue by transport.

## 2024-07-26 DEATH — deceased

## 2025-04-03 ENCOUNTER — Ambulatory Visit
